# Patient Record
Sex: Female | Born: 1955 | Hispanic: Yes | Smoking: Never smoker
Health system: Southern US, Community
[De-identification: ages and names within clinical notes are randomized; demographics above are authoritative.]

## PROBLEM LIST (undated history)

## (undated) DIAGNOSIS — C801 Malignant (primary) neoplasm, unspecified: Secondary | ICD-10-CM

## (undated) DIAGNOSIS — R51 Headache: Secondary | ICD-10-CM

## (undated) DIAGNOSIS — Z973 Presence of spectacles and contact lenses: Secondary | ICD-10-CM

## (undated) DIAGNOSIS — F32A Depression, unspecified: Secondary | ICD-10-CM

## (undated) DIAGNOSIS — Z950 Presence of cardiac pacemaker: Secondary | ICD-10-CM

## (undated) DIAGNOSIS — F329 Major depressive disorder, single episode, unspecified: Secondary | ICD-10-CM

## (undated) DIAGNOSIS — Z972 Presence of dental prosthetic device (complete) (partial): Secondary | ICD-10-CM

## (undated) DIAGNOSIS — G4700 Insomnia, unspecified: Secondary | ICD-10-CM

## (undated) DIAGNOSIS — D0512 Intraductal carcinoma in situ of left breast: Secondary | ICD-10-CM

## (undated) DIAGNOSIS — R7303 Prediabetes: Secondary | ICD-10-CM

## (undated) DIAGNOSIS — E039 Hypothyroidism, unspecified: Secondary | ICD-10-CM

## (undated) DIAGNOSIS — M7918 Myalgia, other site: Secondary | ICD-10-CM

## (undated) DIAGNOSIS — M1990 Unspecified osteoarthritis, unspecified site: Secondary | ICD-10-CM

## (undated) DIAGNOSIS — R519 Headache, unspecified: Secondary | ICD-10-CM

## (undated) DIAGNOSIS — E785 Hyperlipidemia, unspecified: Secondary | ICD-10-CM

## (undated) HISTORY — PX: BREAST SURGERY: SHX581

## (undated) HISTORY — PX: MASTECTOMY, PARTIAL: SHX709

## (undated) HISTORY — PX: RADIOACTIVE SEED GUIDED PARTIAL MASTECTOMY/AXILLARY SENTINEL NODE BIOPSY/AXILLARY NODE DISSECTION: SHX6491

## (undated) HISTORY — PX: COLONOSCOPY: SHX174

---

## 2015-01-22 ENCOUNTER — Other Ambulatory Visit: Payer: Self-pay | Admitting: Obstetrics and Gynecology

## 2015-02-15 ENCOUNTER — Inpatient Hospital Stay (HOSPITAL_COMMUNITY): Admission: RE | Admit: 2015-02-15 | Payer: Self-pay | Source: Ambulatory Visit

## 2015-02-16 ENCOUNTER — Other Ambulatory Visit: Payer: Self-pay | Admitting: Obstetrics and Gynecology

## 2015-02-16 ENCOUNTER — Other Ambulatory Visit (HOSPITAL_COMMUNITY): Payer: Self-pay | Admitting: Obstetrics and Gynecology

## 2015-02-16 NOTE — H&P (Signed)
Tiffany Berg is a 59 y.o.  female P 3-0-0-3 presents for hysterectomy,  anterior-posterior colporrhaphy and placement of tension free vaginal tape because of symptomatic pelvic prolapse.  [employer] utilized Allena Katz # [phone]) to obtain the patient's history of present illness and medical history.  For the past 6 months,  the patient reports daily pelvic pain, hip pain and lower back pain that can increase to 8/10 on a 10 point pain scale.  Symptoms are worse with prolong standing but relieved with sitting and elevation of her feet. She has not taken any analgesia for the pain.  She goes on to report increase pelvic discomfort with voiding,  the need to manually support her uterus in order to void completely and a sensation as if her "bottom" is falling out.  She denies vaginal bleeding, is not sexually active and only on occasion has sluggish bowel movements.  A pelvic ultrasound in March of this year showed:   uterus- 4.95 x 4.41 x 3.48 cm with right ovary-4.02 x 2.61 x 2.87 cm and a simple cyst-3 cm; left ovary-2.72 x 1.43 x 1.08 cm and a 0.08 cm follicle.  Given the findings of complete pelvic procidentia  on exam along with the life altering symptoms described by the patient.  She has consented to proceed with a hysterectomy with bilateral salpingo-oophorectomy, anterior-posterior colporrhaphy,  and placement of tension free vaginal tape for management.   Past Medical History  OB History: G: 3;  P: 3-0-0-3;  SVB 1981,  1985 and 1987  GYN History: menarche: 59 YO    LMP: menopausal     The patient denies history of sexually transmitted disease.  Denies history of abnormal PAP smear.  Last PAP smear: 2015-normal  Medical History: Hypertension, Diabetes Mellitus, Left Breast Cancer, Thyroid Disorder and Hyperlipidemia  Surgical History: 2014  Left Lumpectomy Denies problems with anesthesia or history of blood transfusions  Family History: Heart Disease and Diabetes  Mellitus  Social History: Separated and employed with seasonal work;  Denies tobacco and rare alcohol intake   Medication: Lovastatin 20 mg dailly Levothyroxine 75 mcg daily Tamoxifen 20 mg daily Aspirin 81 mg daily  NKDA  Denies sensitivity to peanuts, shellfish, soy, latex or adhesives.  ROS: Admits to glasses, upper and lower jaw partial dental plates, occasional fleeting lightheadedness, occasional headache, pain in both feet and lower legs at times,  but  denies  vision changes, nasal congestion, dysphagia, tinnitus,  hoarseness, cough, syncope  chest pain, shortness of breath, nausea, vomiting, diarrhea,constipation,  urinary frequency, urgency,  hematuria, vaginitis symptoms,  swelling of joints,easy bruising, pedal edema,  arthralgias, skin rashes, unexplained weight loss and except as is mentioned in the history of present illness, patient's review of systems is otherwise negative.   Physical Exam  Bp: 112/62   P: 72   R: 20  Temperature: 98.2 degrees F orally  Weight: 141 lbs.  Height: 5' 2.5"  BMI: 25.4  Neck: supple without masses or thyromegaly Lungs: clear to auscultation Heart: regular rate and rhythm Abdomen: soft, non-tender and no organomegaly Pelvic:EGBUS- wnl; vagina-atrophic with marked relaxation; uterus-normal size with complete procidentia,  cervix without lesions or motion tenderness; adnexae-no tenderness or masses Extremities:  no clubbing, cyanosis or edema   Assesment:  Pelvic Prolapse   Disposition:  A discussion was held with patient regarding the indication for her procedure(s) along with the risks, which include but are not limited to: reaction to anesthesia, damage to adjacent organs, infection,  excessive bleeding  and erosion of tension free vaginal tape.  The patient verbalized understanding of these risks and has consented to proceed with a Laparoscopically Assisted Vaginal Hysterectomy,  Bilateral Salpingo-ooporectomy, Anterior-Posterior  Colporrhaphy, Cystoscopy and Placement of Tension Free Vaginal Tape at [place] on February 18, 2015  CSN# [id number]   Shady [place] J. Florene Glen, PA-C  for Dr. Harvie Bridge. Mancel Bale  Agree with above

## 2015-02-17 ENCOUNTER — Encounter (HOSPITAL_COMMUNITY)
Admission: RE | Admit: 2015-02-17 | Discharge: 2015-02-17 | Disposition: A | Discharge status: Home / Self Care | Payer: Self-pay | Source: Ambulatory Visit | Attending: Obstetrics and Gynecology | Admitting: Obstetrics and Gynecology

## 2015-02-17 ENCOUNTER — Other Ambulatory Visit: Payer: Self-pay

## 2015-02-17 ENCOUNTER — Encounter (HOSPITAL_COMMUNITY): Payer: Self-pay

## 2015-02-17 DIAGNOSIS — Z01818 Encounter for other preprocedural examination: Secondary | ICD-10-CM | POA: Insufficient documentation

## 2015-02-17 DIAGNOSIS — N819 Female genital prolapse, unspecified: Secondary | ICD-10-CM | POA: Insufficient documentation

## 2015-02-17 HISTORY — DX: Malignant (primary) neoplasm, unspecified: C80.1

## 2015-02-17 HISTORY — DX: Major depressive disorder, single episode, unspecified: F32.9

## 2015-02-17 HISTORY — DX: Hypothyroidism, unspecified: E03.9

## 2015-02-17 HISTORY — DX: Headache: R51

## 2015-02-17 HISTORY — DX: Depression, unspecified: F32.A

## 2015-02-17 HISTORY — DX: Headache, unspecified: R51.9

## 2015-02-17 LAB — CBC
HCT: 38.8 % (ref 36.0–46.0)
Hemoglobin: 13.6 g/dL (ref 12.0–15.0)
MCH: 31.4 pg (ref 26.0–34.0)
MCHC: 35.1 g/dL (ref 30.0–36.0)
MCV: 89.6 fL (ref 78.0–100.0)
Platelets: 208 10*3/uL (ref 150–400)
RBC: 4.33 MIL/uL (ref 3.87–5.11)
RDW: 12.8 % (ref 11.5–15.5)
WBC: 4.9 10*3/uL (ref 4.0–10.5)

## 2015-02-17 LAB — BASIC METABOLIC PANEL
Anion gap: 5 (ref 5–15)
BUN: 16 mg/dL (ref 6–20)
CO2: 29 mmol/L (ref 22–32)
CREATININE: 0.68 mg/dL (ref 0.44–1.00)
Calcium: 8.7 mg/dL — ABNORMAL LOW (ref 8.9–10.3)
Chloride: 106 mmol/L (ref 101–111)
GFR calc Af Amer: >60 mL/min (ref >60)
Glucose, Bld: 103 mg/dL — ABNORMAL HIGH (ref 65–99)
Potassium: 4.2 mmol/L (ref 3.5–5.1)
Sodium: 140 mmol/L (ref 135–145)

## 2015-02-17 NOTE — Patient Instructions (Addendum)
Instrucciones:  Su cirugia esta programada para-( your procedure is scheduled on) :_____6/16/16_____________  Lyndal Pulley por la entrada principal a la(s) -(enter through the main entrance at):__9am___________  [place],  [place] e informenos de su llegada ( pick up phone, dial [phone] on arrival)  Por favor llame al [phone] si tiene algun problema la [place] ( please call [phone] if you have any problems the morning of surgery.)  Recuerde: (Remember)  No coma alimentos ni tome liquidos, incluyendo agua, despues de la medianoche del  ( Do not eat food or drink liquids including water after midnight tonight______________  [employer] medicinas la manana de la cirugia con un sorbito de agua (take these meds the morning of surgery with a SIP of water)_____Thyroid pill_____________________________  Lavena Stanford los dientes en la manana de la [place]. (you may brush your teeth the morning of surgery)  NO use joyas, maquillaje de ojos, lapiz labial, crema para el cuerpo o esmalte de unas oscuro - las unas de los pies pueden estar pintados. ( Do not wear jewelry, eye makeup, lipstick, body lotion, or dark fingernail polish)  Puede usar desodorante ( you may wear deodorant)  Si va a ser ingresado despues de las [place], deje la [place] en el carro hasta que se le haya asignado una habitacion. ( If you are to be admitted after surgery, leave suitcase in car until your room has been assigned.)  A los pacientes que se les de de alta el mismo dia no se les permitira manejar a casa.  ( Patients discharged on the day of surgery will not be allowed to drive home)  Use ropa suelta y comoda de regreso a Manufacturing engineer. ( wear loose comfortable clothes for ride home)  [place] (patient signature) ______________________________________

## 2015-02-18 ENCOUNTER — Encounter (HOSPITAL_COMMUNITY)
Admission: RE | Disposition: A | Discharge status: Home / Self Care | Payer: Self-pay | Source: Ambulatory Visit | Attending: Obstetrics and Gynecology

## 2015-02-18 ENCOUNTER — Observation Stay (HOSPITAL_COMMUNITY)
Admission: RE | Admit: 2015-02-18 | Discharge: 2015-02-20 | Disposition: A | Discharge status: Home / Self Care | Payer: Self-pay | Source: Ambulatory Visit | Attending: Obstetrics and Gynecology | Admitting: Obstetrics and Gynecology

## 2015-02-18 ENCOUNTER — Ambulatory Visit (HOSPITAL_COMMUNITY): Payer: Self-pay | Admitting: Anesthesiology

## 2015-02-18 DIAGNOSIS — N819 Female genital prolapse, unspecified: Secondary | ICD-10-CM | POA: Diagnosis present

## 2015-02-18 DIAGNOSIS — Z7982 Long term (current) use of aspirin: Secondary | ICD-10-CM | POA: Insufficient documentation

## 2015-02-18 DIAGNOSIS — E119 Type 2 diabetes mellitus without complications: Secondary | ICD-10-CM | POA: Insufficient documentation

## 2015-02-18 DIAGNOSIS — E079 Disorder of thyroid, unspecified: Secondary | ICD-10-CM | POA: Insufficient documentation

## 2015-02-18 DIAGNOSIS — Z79899 Other long term (current) drug therapy: Secondary | ICD-10-CM | POA: Insufficient documentation

## 2015-02-18 DIAGNOSIS — N736 Female pelvic peritoneal adhesions (postinfective): Secondary | ICD-10-CM | POA: Insufficient documentation

## 2015-02-18 DIAGNOSIS — N800 Endometriosis of uterus: Secondary | ICD-10-CM | POA: Insufficient documentation

## 2015-02-18 DIAGNOSIS — E785 Hyperlipidemia, unspecified: Secondary | ICD-10-CM | POA: Insufficient documentation

## 2015-02-18 DIAGNOSIS — N840 Polyp of corpus uteri: Secondary | ICD-10-CM | POA: Insufficient documentation

## 2015-02-18 DIAGNOSIS — Z853 Personal history of malignant neoplasm of breast: Secondary | ICD-10-CM | POA: Insufficient documentation

## 2015-02-18 DIAGNOSIS — N814 Uterovaginal prolapse, unspecified: Principal | ICD-10-CM | POA: Insufficient documentation

## 2015-02-18 DIAGNOSIS — I10 Essential (primary) hypertension: Secondary | ICD-10-CM | POA: Insufficient documentation

## 2015-02-18 HISTORY — PX: BLADDER SUSPENSION: SHX72

## 2015-02-18 HISTORY — PX: ANTERIOR AND POSTERIOR REPAIR: SHX5121

## 2015-02-18 HISTORY — PX: SALPINGOOPHORECTOMY: SHX82

## 2015-02-18 HISTORY — PX: CYSTOSCOPY: SHX5120

## 2015-02-18 HISTORY — PX: LAPAROSCOPIC ASSISTED VAGINAL HYSTERECTOMY: SHX5398

## 2015-02-18 LAB — CBC
HCT: 34.6 % — ABNORMAL LOW (ref 36.0–46.0)
HEMOGLOBIN: 12.2 g/dL (ref 12.0–15.0)
MCH: 31.5 pg (ref 26.0–34.0)
MCHC: 35.3 g/dL (ref 30.0–36.0)
MCV: 89.4 fL (ref 78.0–100.0)
Platelets: 195 10*3/uL (ref 150–400)
RBC: 3.87 MIL/uL (ref 3.87–5.11)
RDW: 12.5 % (ref 11.5–15.5)
WBC: 12.7 10*3/uL — ABNORMAL HIGH (ref 4.0–10.5)

## 2015-02-18 SURGERY — HYSTERECTOMY, VAGINAL, LAPAROSCOPY-ASSISTED
Anesthesia: General | Site: Vagina

## 2015-02-18 MED ORDER — PHENYLEPHRINE 40 MCG/ML (10ML) SYRINGE FOR IV PUSH (FOR BLOOD PRESSURE SUPPORT)
PREFILLED_SYRINGE | INTRAVENOUS | Status: AC
  Filled 2015-02-18: qty 10

## 2015-02-18 MED ORDER — BUPIVACAINE HCL (PF) 0.25 % IJ SOLN
INTRAMUSCULAR | Status: DC | PRN
  Administered 2015-02-18: 17 mL

## 2015-02-18 MED ORDER — PHENYLEPHRINE HCL 10 MG/ML IJ SOLN
INTRAMUSCULAR | Status: DC | PRN
  Administered 2015-02-18: 80 ug via INTRAVENOUS

## 2015-02-18 MED ORDER — KETOROLAC TROMETHAMINE 30 MG/ML IJ SOLN
15.0000 mg | Freq: Once | INTRAMUSCULAR | Status: AC
  Administered 2015-02-18: 15 mg via INTRAVENOUS

## 2015-02-18 MED ORDER — ROCURONIUM BROMIDE 100 MG/10ML IV SOLN
INTRAVENOUS | Status: DC | PRN
  Administered 2015-02-18 (×3): 10 mg via INTRAVENOUS
  Administered 2015-02-18: 20 mg via INTRAVENOUS
  Administered 2015-02-18 (×2): 10 mg via INTRAVENOUS
  Administered 2015-02-18: 30 mg via INTRAVENOUS
  Administered 2015-02-18: 10 mg via INTRAVENOUS
  Administered 2015-02-18: 20 mg via INTRAVENOUS

## 2015-02-18 MED ORDER — ROCURONIUM BROMIDE 100 MG/10ML IV SOLN
INTRAVENOUS | Status: AC
  Filled 2015-02-18: qty 1

## 2015-02-18 MED ORDER — PROMETHAZINE HCL 25 MG/ML IJ SOLN: 6.2500 mg | Freq: Once | INTRAMUSCULAR | Status: DC

## 2015-02-18 MED ORDER — DIPHENHYDRAMINE HCL 50 MG/ML IJ SOLN: 12.5000 mg | Freq: Four times a day (QID) | INTRAMUSCULAR | Status: DC | PRN

## 2015-02-18 MED ORDER — CEFAZOLIN SODIUM-DEXTROSE 2-3 GM-% IV SOLR
INTRAVENOUS | Status: AC
Start: 2015-02-18 — End: 2015-02-18
  Filled 2015-02-18: qty 50

## 2015-02-18 MED ORDER — TAMOXIFEN CITRATE 10 MG PO TABS
20.0000 mg | ORAL_TABLET | Freq: Every day | ORAL | Status: DC
  Administered 2015-02-19 (×2): 20 mg via ORAL
  Filled 2015-02-18 (×3): qty 2

## 2015-02-18 MED ORDER — LACTATED RINGERS IV SOLN
INTRAVENOUS | Status: DC
  Administered 2015-02-18 – 2015-02-19 (×2): via INTRAVENOUS

## 2015-02-18 MED ORDER — GLYCOPYRROLATE 0.2 MG/ML IJ SOLN
INTRAMUSCULAR | Status: DC | PRN
  Administered 2015-02-18: 0.2 mg via INTRAVENOUS

## 2015-02-18 MED ORDER — METHYLENE BLUE 1 % INJ SOLN
INTRAMUSCULAR | Status: AC
  Filled 2015-02-18: qty 10

## 2015-02-18 MED ORDER — VASOPRESSIN 20 UNIT/ML IV SOLN
INTRAVENOUS | Status: DC | PRN
  Administered 2015-02-18: 48 mL via INTRAMUSCULAR

## 2015-02-18 MED ORDER — CEFAZOLIN SODIUM-DEXTROSE 2-3 GM-% IV SOLR
INTRAVENOUS | Status: AC
  Filled 2015-02-18: qty 50

## 2015-02-18 MED ORDER — HYDROMORPHONE HCL 1 MG/ML IJ SOLN
INTRAMUSCULAR | Status: AC
  Filled 2015-02-18: qty 1

## 2015-02-18 MED ORDER — PRAVASTATIN SODIUM 20 MG PO TABS
20.0000 mg | ORAL_TABLET | Freq: Every day | ORAL | Status: DC
  Administered 2015-02-19: 20 mg via ORAL
  Filled 2015-02-18 (×2): qty 1

## 2015-02-18 MED ORDER — HYDROMORPHONE HCL 1 MG/ML IJ SOLN
INTRAMUSCULAR | Status: DC | PRN
  Administered 2015-02-18 (×2): 0.5 mg via INTRAVENOUS

## 2015-02-18 MED ORDER — ACETAMINOPHEN 160 MG/5ML PO SOLN
ORAL | Status: AC
  Administered 2015-02-18: 650 mg via ORAL
  Filled 2015-02-18: qty 40.6

## 2015-02-18 MED ORDER — IBUPROFEN 600 MG PO TABS
600.0000 mg | ORAL_TABLET | Freq: Four times a day (QID) | ORAL | Status: DC | PRN
  Administered 2015-02-19 – 2015-02-20 (×3): 600 mg via ORAL
  Filled 2015-02-18 (×3): qty 1

## 2015-02-18 MED ORDER — MIDAZOLAM HCL 5 MG/5ML IJ SOLN
INTRAMUSCULAR | Status: DC | PRN
  Administered 2015-02-18: 2 mg via INTRAVENOUS

## 2015-02-18 MED ORDER — PROMETHAZINE HCL 25 MG/ML IJ SOLN
INTRAMUSCULAR | Status: AC
  Administered 2015-02-18: 6.25 mg
  Filled 2015-02-18: qty 1

## 2015-02-18 MED ORDER — KETOROLAC TROMETHAMINE 30 MG/ML IJ SOLN
15.0000 mg | Freq: Four times a day (QID) | INTRAMUSCULAR | Status: AC
  Administered 2015-02-19 (×2): 15 mg via INTRAVENOUS
  Filled 2015-02-18 (×2): qty 1

## 2015-02-18 MED ORDER — SODIUM CHLORIDE 0.9 % IJ SOLN
INTRAMUSCULAR | Status: AC
  Filled 2015-02-18: qty 100

## 2015-02-18 MED ORDER — OXYCODONE-ACETAMINOPHEN 5-325 MG PO TABS
1.0000 | ORAL_TABLET | ORAL | Status: DC | PRN
  Administered 2015-02-19: 2 via ORAL
  Administered 2015-02-19 – 2015-02-20 (×3): 1 via ORAL
  Filled 2015-02-18: qty 1
  Filled 2015-02-18: qty 2
  Filled 2015-02-18 (×2): qty 1

## 2015-02-18 MED ORDER — NALOXONE HCL 0.4 MG/ML IJ SOLN: 0.4000 mg | INTRAMUSCULAR | Status: DC | PRN

## 2015-02-18 MED ORDER — ONDANSETRON HCL 4 MG/2ML IJ SOLN
INTRAMUSCULAR | Status: AC
  Filled 2015-02-18: qty 2

## 2015-02-18 MED ORDER — FENTANYL CITRATE (PF) 100 MCG/2ML IJ SOLN
INTRAMUSCULAR | Status: DC | PRN
  Administered 2015-02-18 (×3): 50 ug via INTRAVENOUS
  Administered 2015-02-18: 100 ug via INTRAVENOUS
  Administered 2015-02-18 (×3): 50 ug via INTRAVENOUS
  Administered 2015-02-18: 100 ug via INTRAVENOUS

## 2015-02-18 MED ORDER — HYDROMORPHONE 0.3 MG/ML IV SOLN
INTRAVENOUS | Status: DC
  Administered 2015-02-18: 21:00:00 via INTRAVENOUS
  Administered 2015-02-19: 0.599 mg via INTRAVENOUS
  Administered 2015-02-19: 0.2 mg via INTRAVENOUS
  Filled 2015-02-18: qty 25

## 2015-02-18 MED ORDER — NEOSTIGMINE METHYLSULFATE 10 MG/10ML IV SOLN
INTRAVENOUS | Status: AC
  Filled 2015-02-18: qty 1

## 2015-02-18 MED ORDER — BUPIVACAINE HCL (PF) 0.25 % IJ SOLN
INTRAMUSCULAR | Status: AC
Start: 2015-02-18 — End: 2015-02-18
  Filled 2015-02-18: qty 30

## 2015-02-18 MED ORDER — LIDOCAINE HCL (CARDIAC) 20 MG/ML IV SOLN
INTRAVENOUS | Status: DC | PRN
  Administered 2015-02-18: 40 mg via INTRAVENOUS

## 2015-02-18 MED ORDER — SODIUM CHLORIDE 0.9 % IJ SOLN: 9.0000 mL | INTRAMUSCULAR | Status: DC | PRN

## 2015-02-18 MED ORDER — LACTATED RINGERS IV SOLN
INTRAVENOUS | Status: DC
Start: 2015-02-18 — End: 2015-02-18
  Administered 2015-02-18 (×3): via INTRAVENOUS

## 2015-02-18 MED ORDER — GLYCOPYRROLATE 0.2 MG/ML IJ SOLN
INTRAMUSCULAR | Status: AC
  Filled 2015-02-18: qty 4

## 2015-02-18 MED ORDER — LACTATED RINGERS IR SOLN
Status: DC | PRN
  Administered 2015-02-18: 3000 mL

## 2015-02-18 MED ORDER — SCOPOLAMINE 1 MG/3DAYS TD PT72
MEDICATED_PATCH | TRANSDERMAL | Status: AC
  Administered 2015-02-18: 1.5 mg
  Filled 2015-02-18: qty 1

## 2015-02-18 MED ORDER — PROPOFOL 10 MG/ML IV BOLUS
INTRAVENOUS | Status: DC | PRN
  Administered 2015-02-18: 130 mg via INTRAVENOUS

## 2015-02-18 MED ORDER — LEVOTHYROXINE SODIUM 75 MCG PO TABS
75.0000 ug | ORAL_TABLET | Freq: Every day | ORAL | Status: DC
  Administered 2015-02-19 – 2015-02-20 (×2): 75 ug via ORAL
  Filled 2015-02-18 (×3): qty 1

## 2015-02-18 MED ORDER — HYDROMORPHONE HCL 1 MG/ML IJ SOLN
0.2500 mg | INTRAMUSCULAR | Status: DC | PRN
  Administered 2015-02-18: 0.25 mg via INTRAVENOUS

## 2015-02-18 MED ORDER — HEPARIN SODIUM (PORCINE) 5000 UNIT/ML IJ SOLN
INTRAMUSCULAR | Status: AC
  Filled 2015-02-18: qty 1

## 2015-02-18 MED ORDER — FENTANYL CITRATE (PF) 250 MCG/5ML IJ SOLN
INTRAMUSCULAR | Status: AC
  Filled 2015-02-18: qty 5

## 2015-02-18 MED ORDER — STERILE WATER FOR IRRIGATION IR SOLN
Status: DC | PRN
Start: 2015-02-18 — End: 2015-02-18
  Administered 2015-02-18: 1000 mL via INTRAVESICAL

## 2015-02-18 MED ORDER — 0.9 % SODIUM CHLORIDE (POUR BTL) OPTIME
TOPICAL | Status: DC | PRN
  Administered 2015-02-18: 2000 mL

## 2015-02-18 MED ORDER — ESTRADIOL 0.1 MG/GM VA CREA
TOPICAL_CREAM | VAGINAL | Status: AC
  Filled 2015-02-18: qty 85

## 2015-02-18 MED ORDER — ONDANSETRON HCL 4 MG PO TABS: 4.0000 mg | ORAL_TABLET | Freq: Three times a day (TID) | ORAL | Status: DC | PRN

## 2015-02-18 MED ORDER — ONDANSETRON HCL 4 MG/2ML IJ SOLN: 4.0000 mg | Freq: Four times a day (QID) | INTRAMUSCULAR | Status: DC | PRN

## 2015-02-18 MED ORDER — CEFAZOLIN SODIUM-DEXTROSE 2-3 GM-% IV SOLR
2.0000 g | INTRAVENOUS | Status: AC
  Administered 2015-02-18 (×2): 2 g via INTRAVENOUS

## 2015-02-18 MED ORDER — METHYLENE BLUE 1 % INJ SOLN
INTRAMUSCULAR | Status: DC | PRN
  Administered 2015-02-18: 5 mg via INTRAVENOUS

## 2015-02-18 MED ORDER — PROPOFOL 10 MG/ML IV BOLUS
INTRAVENOUS | Status: AC
  Filled 2015-02-18: qty 20

## 2015-02-18 MED ORDER — LIDOCAINE HCL (CARDIAC) 20 MG/ML IV SOLN
INTRAVENOUS | Status: AC
Start: 2015-02-18 — End: 2015-02-18
  Filled 2015-02-18: qty 5

## 2015-02-18 MED ORDER — ESTRADIOL 0.1 MG/GM VA CREA
TOPICAL_CREAM | VAGINAL | Status: DC | PRN
  Administered 2015-02-18: 1 via VAGINAL

## 2015-02-18 MED ORDER — MENTHOL 3 MG MT LOZG: 1.0000 | LOZENGE | OROMUCOSAL | Status: DC | PRN

## 2015-02-18 MED ORDER — DIPHENHYDRAMINE HCL 12.5 MG/5ML PO ELIX: 12.5000 mg | ORAL_SOLUTION | Freq: Four times a day (QID) | ORAL | Status: DC | PRN

## 2015-02-18 MED ORDER — KETOROLAC TROMETHAMINE 30 MG/ML IJ SOLN
INTRAMUSCULAR | Status: AC
  Administered 2015-02-18: 15 mg via INTRAVENOUS
  Filled 2015-02-18: qty 1

## 2015-02-18 MED ORDER — MIDAZOLAM HCL 2 MG/2ML IJ SOLN
INTRAMUSCULAR | Status: AC
  Filled 2015-02-18: qty 2

## 2015-02-18 MED ORDER — NEOSTIGMINE METHYLSULFATE 10 MG/10ML IV SOLN
INTRAVENOUS | Status: DC | PRN
  Administered 2015-02-18: 1.5 mg via INTRAVENOUS

## 2015-02-18 MED ORDER — ZOLPIDEM TARTRATE 5 MG PO TABS: 5.0000 mg | ORAL_TABLET | Freq: Every evening | ORAL | Status: DC | PRN

## 2015-02-18 MED ORDER — DEXAMETHASONE SODIUM PHOSPHATE 10 MG/ML IJ SOLN
INTRAMUSCULAR | Status: AC
  Filled 2015-02-18: qty 1

## 2015-02-18 MED ORDER — ONDANSETRON HCL 4 MG/2ML IJ SOLN
INTRAMUSCULAR | Status: DC | PRN
  Administered 2015-02-18: 4 mg via INTRAVENOUS

## 2015-02-18 MED ORDER — DEXAMETHASONE SODIUM PHOSPHATE 10 MG/ML IJ SOLN
INTRAMUSCULAR | Status: DC | PRN
  Administered 2015-02-18: 10 mg via INTRAVENOUS

## 2015-02-18 MED ORDER — ACETAMINOPHEN 160 MG/5ML PO SOLN
650.0000 mg | Freq: Once | ORAL | Status: AC
  Administered 2015-02-18: 650 mg via ORAL

## 2015-02-18 MED ORDER — VASOPRESSIN 20 UNIT/ML IV SOLN
INTRAVENOUS | Status: AC
  Filled 2015-02-18: qty 1

## 2015-02-18 SURGICAL SUPPLY — 69 items
BLADE SURG 11 STRL SS (BLADE) ×5 IMPLANT
BLADE SURG 15 STRL LF C SS BP (BLADE) ×4 IMPLANT
BLADE SURG 15 STRL SS (BLADE) ×1
CABLE HIGH FREQUENCY MONO STRZ (ELECTRODE) ×5 IMPLANT
CANISTER SUCT 3000ML (MISCELLANEOUS) ×5 IMPLANT
CATH FOLEY 2WAY SLVR  5CC 18FR (CATHETERS) ×2
CATH FOLEY 2WAY SLVR 5CC 18FR (CATHETERS) ×8 IMPLANT
CATH ROBINSON RED A/P 16FR (CATHETERS) ×5 IMPLANT
CLOTH BEACON ORANGE TIMEOUT ST (SAFETY) ×5 IMPLANT
CONT PATH 16OZ SNAP LID 3702 (MISCELLANEOUS) ×5 IMPLANT
COUNTER NEEDLE 1200 MAGNETIC (NEEDLE) ×5 IMPLANT
COVER BACK TABLE 60X90IN (DRAPES) ×5 IMPLANT
DECANTER SPIKE VIAL GLASS SM (MISCELLANEOUS) ×5 IMPLANT
DRAPE SHEET LG 3/4 BI-LAMINATE (DRAPES) ×10 IMPLANT
DRAPE STERI URO 9X17 APER PCH (DRAPES) ×5 IMPLANT
DRSG COVADERM PLUS 2X2 (GAUZE/BANDAGES/DRESSINGS) ×10 IMPLANT
DRSG OPSITE POSTOP 3X4 (GAUZE/BANDAGES/DRESSINGS) ×10 IMPLANT
DURAPREP 26ML APPLICATOR (WOUND CARE) ×5 IMPLANT
ELECT REM PT RETURN 9FT ADLT (ELECTROSURGICAL) ×5
ELECTRODE REM PT RTRN 9FT ADLT (ELECTROSURGICAL) ×4 IMPLANT
EVACUATOR SMOKE 8.L (FILTER) ×5 IMPLANT
FORCEPS CUTTING 33CM 5MM (CUTTING FORCEPS) ×10 IMPLANT
GAUZE PACKING 1 X5 YD ST (GAUZE/BANDAGES/DRESSINGS) ×5 IMPLANT
GAUZE PACKING 2X5 YD STRL (GAUZE/BANDAGES/DRESSINGS) ×5 IMPLANT
GAUZE SPONGE 4X4 16PLY XRAY LF (GAUZE/BANDAGES/DRESSINGS) ×10 IMPLANT
GLOVE BIO SURGEON STRL SZ7.5 (GLOVE) ×20 IMPLANT
GLOVE BIOGEL PI IND STRL 6.5 (GLOVE) ×4 IMPLANT
GLOVE BIOGEL PI IND STRL 7.0 (GLOVE) ×8 IMPLANT
GLOVE BIOGEL PI IND STRL 7.5 (GLOVE) ×8 IMPLANT
GLOVE BIOGEL PI INDICATOR 6.5 (GLOVE) ×1
GLOVE BIOGEL PI INDICATOR 7.0 (GLOVE) ×2
GLOVE BIOGEL PI INDICATOR 7.5 (GLOVE) ×2
GLOVE ECLIPSE 6.0 STRL STRAW (GLOVE) ×5 IMPLANT
GLOVE SURG SS PI 7.0 STRL IVOR (GLOVE) ×20 IMPLANT
GOWN STRL REUS W/TWL LRG LVL3 (GOWN DISPOSABLE) ×20 IMPLANT
LIQUID BAND (GAUZE/BANDAGES/DRESSINGS) ×5 IMPLANT
NEEDLE HYPO 22GX1.5 SAFETY (NEEDLE) ×5 IMPLANT
NEEDLE INSUFFLATION 120MM (ENDOMECHANICALS) ×5 IMPLANT
NEEDLE MAYO .5 CIRCLE (NEEDLE) ×5 IMPLANT
NS IRRIG 1000ML POUR BTL (IV SOLUTION) ×5 IMPLANT
PACK LAVH (CUSTOM PROCEDURE TRAY) ×5 IMPLANT
PACK ROBOTIC GOWN (GOWN DISPOSABLE) ×5 IMPLANT
PACK VAGINAL WOMENS (CUSTOM PROCEDURE TRAY) ×5 IMPLANT
PAD POSITIONER PINK NONSTERILE (MISCELLANEOUS) ×5 IMPLANT
SCISSORS LAP 5X35 DISP (ENDOMECHANICALS) ×5 IMPLANT
SET CYSTO W/LG BORE CLAMP LF (SET/KITS/TRAYS/PACK) ×5 IMPLANT
SET IRRIG TUBING LAPAROSCOPIC (IRRIGATION / IRRIGATOR) ×5 IMPLANT
SLEEVE XCEL OPT CAN 5 100 (ENDOMECHANICALS) ×5 IMPLANT
SLING TRANS VAGINAL TAPE (Sling) ×1 IMPLANT
SLING UTERINE/ABD GYNECARE TVT (Sling) ×4 IMPLANT
SPONGE SURGIFOAM ABS GEL 12-7 (HEMOSTASIS) ×15 IMPLANT
STRIP CLOSURE SKIN 1/4X3 (GAUZE/BANDAGES/DRESSINGS) IMPLANT
SUT MON AB 4-0 PS1 27 (SUTURE) ×5 IMPLANT
SUT VIC AB 0 CT1 18XCR BRD8 (SUTURE) ×12 IMPLANT
SUT VIC AB 0 CT1 27 (SUTURE) ×1
SUT VIC AB 0 CT1 27XBRD ANBCTR (SUTURE) ×4 IMPLANT
SUT VIC AB 0 CT1 36 (SUTURE) ×5 IMPLANT
SUT VIC AB 0 CT1 8-18 (SUTURE) ×3
SUT VIC AB 2-0 CT1 27 (SUTURE) ×6
SUT VIC AB 2-0 CT1 TAPERPNT 27 (SUTURE) ×24 IMPLANT
SUT VIC AB 2-0 SH 27 (SUTURE) ×11
SUT VIC AB 2-0 SH 27XBRD (SUTURE) ×44 IMPLANT
SUT VICRYL 0 TIES 12 18 (SUTURE) ×5 IMPLANT
SYR TB 1ML LUER SLIP (SYRINGE) ×5 IMPLANT
TOWEL OR 17X24 6PK STRL BLUE (TOWEL DISPOSABLE) ×10 IMPLANT
TRAY FOLEY CATH SILVER 14FR (SET/KITS/TRAYS/PACK) ×5 IMPLANT
TROCAR XCEL NON-BLD 11X100MML (ENDOMECHANICALS) ×5 IMPLANT
WARMER LAPAROSCOPE (MISCELLANEOUS) ×5 IMPLANT
WATER STERILE IRR 1000ML POUR (IV SOLUTION) ×5 IMPLANT

## 2015-02-18 NOTE — Transfer of Care (Signed)
Immediate Anesthesia Transfer of Care Note  Patient: Tiffany Berg  Procedure(s) Performed: Procedure(s): LAPAROSCOPIC ASSISTED VAGINAL HYSTERECTOMY (N/A) BILATERAL SALPINGO OOPHORECTOMY (Bilateral) TRANSVAGINAL TAPE (TVT) PROCEDURE (N/A) CYSTOSCOPY (Bilateral) ANTERIOR (CYSTOCELE) AND POSTERIOR REPAIR (RECTOCELE) (N/A)  Patient Location: PACU  Anesthesia Type:General  Level of Consciousness: awake, alert , oriented and patient cooperative  Airway & Oxygen Therapy: Patient Spontanous Breathing and Patient connected to nasal cannula oxygen  Post-op Assessment: Report given to RN and Post -op Vital signs reviewed and stable  Post vital signs: Reviewed and stable  Last Vitals:  Filed Vitals:   02/18/15 0911  BP: 125/64  Pulse: 60  Temp: 36.8 C  Resp: 20    Complications: No apparent anesthesia complications

## 2015-02-18 NOTE — Anesthesia Procedure Notes (Signed)
Procedure Name: Intubation Date/Time: 02/18/2015 10:42 AM Performed by: Ignacia Bayley Pre-anesthesia Checklist: Patient identified, Emergency Drugs available, Suction available and Patient being monitored Patient Re-evaluated:Patient Re-evaluated prior to inductionOxygen Delivery Method: Circle system utilized Preoxygenation: Pre-oxygenation with 100% oxygen Intubation Type: IV induction Ventilation: Mask ventilation without difficulty Laryngoscope Size: Miller and 2 Grade View: Grade I Tube type: Oral Tube size: 7.0 mm Number of attempts: 1 Airway Equipment and Method: Stylet Placement Confirmation: ETT inserted through vocal cords under direct vision,  positive ETCO2 and breath sounds checked- equal and bilateral Secured at: 20 cm Tube secured with: Tape Dental Injury: Teeth and Oropharynx as per pre-operative assessment

## 2015-02-18 NOTE — Anesthesia Preprocedure Evaluation (Signed)
Anesthesia Evaluation  Patient identified by MRN, date of birth, ID band Patient awake    Reviewed: Allergy & Precautions, H&P , Patient's Chart, lab work & pertinent test results, reviewed documented beta blocker date and time   Airway Mallampati: II  TM Distance: >3 FB Neck ROM: full    Dental no notable dental hx.    Pulmonary  breath sounds clear to auscultation  Pulmonary exam normal       Cardiovascular Rhythm:regular Rate:Normal     Neuro/Psych PSYCHIATRIC DISORDERS    GI/Hepatic   Endo/Other  Hypothyroidism   Renal/GU      Musculoskeletal   Abdominal   Peds  Hematology   Anesthesia Other Findings   Reproductive/Obstetrics                             Anesthesia Physical Anesthesia Plan  ASA: II  Anesthesia Plan: General   Post-op Pain Management:    Induction: Intravenous  Airway Management Planned: Oral ETT  Additional Equipment:   Intra-op Plan:   Post-operative Plan: Extubation in OR  Informed Consent: I have reviewed the patients History and Physical, chart, labs and discussed the procedure including the risks, benefits and alternatives for the proposed anesthesia with the patient or authorized representative who has indicated his/her understanding and acceptance.   Dental Advisory Given and Dental advisory given  Plan Discussed with: CRNA and Surgeon  Anesthesia Plan Comments: (  Discussed general anesthesia, including possible nausea, instrumentation of airway, sore throat,pulmonary aspiration, etc. I asked if the were any outstanding questions, or  concerns before we proceeded. )        Anesthesia Quick Evaluation

## 2015-02-18 NOTE — Op Note (Signed)
Preop Diagnosis: Symptomatic Pelvic Relaxation (uterine prolapse, cystocele and rectocele)  Postop Diagnosis: 1.Symptomatic Pelvic Relaxation (uterine prolapse, cystocele and rectocele) 2.Adnexal Adhesions  Procedure: 1.LAVH 2.BS 3. LOA 4.Anterior and Posterior Colporrhaphy 5.Cystoscopy  Anesthesia: General   Attending: Everett Graff, MD  Assistant: Earnstine Regal, PA  Findings: Adhesions to bilateral adnexa L>R, Left ovary adherent to left pelvic side wall  Pathology: uterus, cervix, bilateral fallopian tubes and ovaries  Fluids: 2600 cc  UOP: 1500 cc  EBL: 989 cc  Complications: None  Procedure: The patient was taken to the operating room, placed under general anesthesia and prepped and draped in the normal sterile fashion. A time out was performed.  A Foley catheter was placed in the bladder.  The uterus was prolapsed beyond the introitus and hulka manipulator was placed in the uterus.  Attention was then turned to the abdomen. A 10 mm infraumbilical incision was made with the scalpel after 8 of 0.25% percent Marcaine was used for local anesthesia.  The Veress needle was placed in the intraabdominal cavity.  The 48mm trochar was introduced into the intraabdominal cavity as well.  Two 5 mm trochars were placed in the right and left lower quadrants under direct visualization with the laparoscope and incisions were made and 0.25% Marcaine injected.  A 68mm suprapubic incision was made as well and 33mm trochar introduced under direct visualization.  Using the laparoscope, scissors and gyrus tripolar, the adhesions were cauterized and cut bilaterally.  The left infundibulopelvic ligament was cauterized and cut and ovary and tube excised as well while hugging close to the ovary until it was completely freed and uterine ovarian ligament cauterized and cut as well.  There may have been even a small amount of ovarian tissue that remained on that left pelvic side wall.  The left ovary and tube were  removed through the 10 mm suprapubic port.  The left round ligament was cauterized and cut with the gyrus tripolar cautery as well and the bladder flap created with the monopolar scissors and removed away from the uterus after doing the same to the contralateral side.  The abdomen was then draped and attention was then turned to the vagina.  A weighted speculum was placed in the posterior fourchette.  20u of pitressin in 100cc of NS was injected circumferentially around the cervix.  The cervix was circumscribed with the bovie and with blunt and sharp dissection the cervix was dissected away from the bladder.  The anterior cul-de-sac was entered however the posterior cul-de-sac was entered only after clamping bilaterally along the cervix with the heanie clamp, excising and ligating the pedicles until finally the posterior cul-de-sac was able to be entered.  There had been multiple adhesions posteriorly in the cul-de-sac and these were bluntly dissected away once the posterior cul-de-sac was entered.  Both uterosacral and cardinal ligaments were clamped, cut and suture ligated.  The uterine arteries were clamped, cut and suture ligated bilaterally.  The remained of the cervix and uterus (parametrial tissue) was excised after clamping the adjacent tissue, cutting and suture ligating up to the fundus and the uterus removed.  The mcall suture was placed.   The vaginal cuff was closed with interrupted sutures of 0 vicryl.   The patient was given methylene blue and cystoscopy was performed.  Both ureters were seen to efflux without difficulty but the urine was too dilute to ever see the methylene blue.  The bladder had full integrity with no suture or laceration visualized.  The vagina was  inspected and the cuff was noted to be intact.    A weighted speculum was placed in the patient's vagina and the anterior vaginal wall was retracted using Deaver retractors. The anterior vaginal wall was injected with dilute pitressin  and the anterior vaginal wall was then incised and dissected away from the underlying layer of tissue. The cystocele was repaired with plication stitches and a purse string stitch.  The excess vaginal wall tissue was excised and repaired with interrupted stitches of 2-0 Vicryl. Attention was then turned to the posterior vaginal wall where dilute Pitressin was injected. The posterior vaginal wall was incised and the underlying tissue was dissected away from the posterior vaginal wall. The rectocele was repaired using plication stitches of 2-0 Vicryl. Excess posterior vaginal wall tissue was excised and the posterior vaginal wall repaired with 2-0 vicryl via a running interlocking stitch. The perineum was repaired with 2-0 Vicryl via a subcuticular stitch. Cystoscopy was performed and bilateral ureters were noted to efflux without difficulty and there were no inadvertent bladder injuries noted.   The anterior vaginal wall was injected with dilute pitressin at a concentration of 20 units of pitressin in a total of 100cc of normal saline.  An incision was made in the anterior wall of the vagina for approximately 1cm beneath the midurethra and the underlying tissue was dissected away from the anterior vaginal wall down to the level of the lower symphysis pubis bilaterally. Attention was then turned to the mons pubis where two 5 mm incisions were made 2 fingerbreadths from the midline. The transabdominal guide was then passed through the mons pubis incision on the patient's right down through the space of Retzius and out through the anterior vaginal wall after deflecting the rigid urethral catheter guide to the ipsilateral side. The same was done on the contralateral side. Cystoscopy was performed and no invadvertant bladder injury was noted. The bladder was drained with a Foley while deflecting the rigid urethral catheter guide to the patient's right and the mesh was attached to the transabdominal guide and  elevated up through the space of Retzius and out through the incision on the mons pubis on the ipsilateral side. The same was done on the contralateral side. Cystoscopy was performed again and no inadvertant bladder injury was noted. The 62 French Foley was left in the urethra and a large Claiborne Billings was placed between the urethra and the mesh in order to leave the mesh slack beneath the midurethra. The mesh was then cut flush with the skin at the mons pubis incisions bilaterally. Indigo carmine had been administered, cystoscopy was performed again and bilateral ureters were noted to efflux without difficulty.  The anterior vaginal wall incision was repaired with 2-0 vicryl via a running interlocking stitch.  Attention was then turned back to the abdomen after removing top pair of gloves. The abdomen was reinsufflated with CO2 gas.   The abdomen and pelvis was copiously irrigated.  There was some bleeding from the left adnexa which was made hemostatic with cautery.  Hemostasis was noted.  Gel foam was placed along the cuff and the left pedicle.  Good hemostasis was noted.  The suprapubic trochar was removed and fascia repaired with 0 vicryl via a figure of eight stitch.  All trochars were removed under direct visualization using the laparoscope and pneumoperitoneum released.  The umbilical fascia was repaired with 0 vicryl via a figure of eight stitch. The two 10 mm incisions were closed with 3-0 Monocryl via a subcuticular stitch.  All remaining skin incisions were closed with Liquabond and the 10 mm skin incisions were reinforced using Liquabond as well. The vagina was packed with estrogen-soaked packing. The patient tolerated the procedure well and was awaiting return to the recovery room in good condition.  Sponge, instrument, lap and needle counts were correct for each part of the case.  The patient tolerated the procedure well and was returned to the recovery room in stable condition

## 2015-02-18 NOTE — Anesthesia Postprocedure Evaluation (Signed)
   Anesthesia Post-op Note  Patient: Tiffany Berg  Procedure(s) Performed: Procedure(s) (LRB): LAPAROSCOPIC ASSISTED VAGINAL HYSTERECTOMY (N/A) BILATERAL SALPINGO OOPHORECTOMY (Bilateral) TRANSVAGINAL TAPE (TVT) PROCEDURE (N/A) CYSTOSCOPY (Bilateral) ANTERIOR (CYSTOCELE) AND POSTERIOR REPAIR (RECTOCELE) (N/A)  Patient Location: PACU  Anesthesia Type: General  Level of Consciousness: awake and alert   Airway and Oxygen Therapy: Patient Spontanous Breathing  Post-op Pain: mild  Post-op Assessment: Post-op Vital signs reviewed, Patient's Cardiovascular Status Stable, Respiratory Function Stable, Patent Airway and No signs of Nausea or vomiting  Last Vitals:  Filed Vitals:   02/18/15 1930  BP: 117/66  Pulse: 80  Temp:   Resp: 23    Post-op Vital Signs: stable   Complications: No apparent anesthesia complications

## 2015-02-18 NOTE — Interval H&P Note (Signed)
History and Physical Interval Note:  02/18/2015 10:23 AM  Tiffany Berg  has presented today for surgery, with the diagnosis of Genital prolapse - 4 hours  The various methods of treatment have been discussed with the patient and family. After consideration of risks, benefits and other options for treatment, the patient has consented to  Procedure(s): LAPAROSCOPIC ASSISTED VAGINAL HYSTERECTOMY (N/A) BILATERAL SALPINGO OOPHORECTOMY (Bilateral) TENSION FREE TRANSVAGINAL TAPE (TVT) PROCEDURE (N/A) CYSTOSCOPY (Bilateral) ANTERIOR (CYSTOCELE) AND POSTERIOR REPAIR (RECTOCELE) (N/A) as a surgical intervention .  The patient's history has been reviewed, patient examined, no change in status, stable for surgery.  I have reviewed the patient's chart and labs.  Questions were answered to the patient's satisfaction.     Delice Lesch

## 2015-02-18 NOTE — H&P (View-Only) (Signed)
Tiffany Berg is a 59 y.o.  female P 3-0-0-3 presents for hysterectomy,  anterior-posterior colporrhaphy and placement of tension free vaginal tape because of symptomatic pelvic prolapse.  [employer] utilized Allena Katz # [phone]) to obtain the patient's history of present illness and medical history.  For the past 6 months,  the patient reports daily pelvic pain, hip pain and lower back pain that can increase to 8/10 on a 10 point pain scale.  Symptoms are worse with prolong standing but relieved with sitting and elevation of her feet. She has not taken any analgesia for the pain.  She goes on to report increase pelvic discomfort with voiding,  the need to manually support her uterus in order to void completely and a sensation as if her "bottom" is falling out.  She denies vaginal bleeding, is not sexually active and only on occasion has sluggish bowel movements.  A pelvic ultrasound in March of this year showed:   uterus- 4.95 x 4.41 x 3.48 cm with right ovary-4.02 x 2.61 x 2.87 cm and a simple cyst-3 cm; left ovary-2.72 x 1.43 x 1.08 cm and a 3.55 cm follicle.  Given the findings of complete pelvic procidentia  on exam along with the life altering symptoms described by the patient.  She has consented to proceed with a hysterectomy with bilateral salpingo-oophorectomy, anterior-posterior colporrhaphy,  and placement of tension free vaginal tape for management.   Past Medical History  OB History: G: 3;  P: 3-0-0-3;  SVB 1981,  1985 and 1987  GYN History: menarche: 59 YO    LMP: menopausal     The patient denies history of sexually transmitted disease.  Denies history of abnormal PAP smear.  Last PAP smear: 2015-normal  Medical History: Hypertension, Diabetes Mellitus, Left Breast Cancer, Thyroid Disorder and Hyperlipidemia  Surgical History: 2014  Left Lumpectomy Denies problems with anesthesia or history of blood transfusions  Family History: Heart Disease and Diabetes  Mellitus  Social History: Separated and employed with seasonal work;  Denies tobacco and rare alcohol intake   Medication: Lovastatin 20 mg dailly Levothyroxine 75 mcg daily Tamoxifen 20 mg daily Aspirin 81 mg daily  NKDA  Denies sensitivity to peanuts, shellfish, soy, latex or adhesives.  ROS: Admits to glasses, upper and lower jaw partial dental plates, occasional fleeting lightheadedness, occasional headache, pain in both feet and lower legs at times,  but  denies  vision changes, nasal congestion, dysphagia, tinnitus,  hoarseness, cough, syncope  chest pain, shortness of breath, nausea, vomiting, diarrhea,constipation,  urinary frequency, urgency,  hematuria, vaginitis symptoms,  swelling of joints,easy bruising, pedal edema,  arthralgias, skin rashes, unexplained weight loss and except as is mentioned in the history of present illness, patient's review of systems is otherwise negative.   Physical Exam  Bp: 112/62   P: 72   R: 20  Temperature: 98.2 degrees F orally  Weight: 141 lbs.  Height: 5' 2.5"  BMI: 25.4  Neck: supple without masses or thyromegaly Lungs: clear to auscultation Heart: regular rate and rhythm Abdomen: soft, non-tender and no organomegaly Pelvic:EGBUS- wnl; vagina-atrophic with marked relaxation; uterus-normal size with complete procidentia,  cervix without lesions or motion tenderness; adnexae-no tenderness or masses Extremities:  no clubbing, cyanosis or edema   Assesment:  Pelvic Prolapse   Disposition:  A discussion was held with patient regarding the indication for her procedure(s) along with the risks, which include but are not limited to: reaction to anesthesia, damage to adjacent organs, infection,  excessive bleeding  and erosion of tension free vaginal tape.  The patient verbalized understanding of these risks and has consented to proceed with a Laparoscopically Assisted Vaginal Hysterectomy,  Bilateral Salpingo-ooporectomy, Anterior-Posterior  Colporrhaphy, Cystoscopy and Placement of Tension Free Vaginal Tape at [place] on February 18, 2015  CSN# [id number]   [place] J. Florene Glen, PA-C  for Dr. Harvie Bridge. Mancel Bale  Agree with above

## 2015-02-19 ENCOUNTER — Encounter (HOSPITAL_COMMUNITY): Payer: Self-pay | Admitting: *Deleted

## 2015-02-19 LAB — CBC
HEMATOCRIT: 30 % — AB (ref 36.0–46.0)
Hemoglobin: 10.5 g/dL — ABNORMAL LOW (ref 12.0–15.0)
MCH: 31.1 pg (ref 26.0–34.0)
MCHC: 35 g/dL (ref 30.0–36.0)
MCV: 88.8 fL (ref 78.0–100.0)
PLATELETS: 173 10*3/uL (ref 150–400)
RBC: 3.38 MIL/uL — ABNORMAL LOW (ref 3.87–5.11)
RDW: 12.8 % (ref 11.5–15.5)
WBC: 10.3 10*3/uL (ref 4.0–10.5)

## 2015-02-19 MED ORDER — OXYCODONE-ACETAMINOPHEN 5-325 MG PO TABS: 1.0000 | ORAL_TABLET | ORAL | Status: DC | PRN

## 2015-02-19 MED ORDER — IBUPROFEN 600 MG PO TABS: ORAL_TABLET | ORAL | Status: DC

## 2015-02-19 NOTE — Progress Notes (Signed)
Tiffany Berg is a24 y.o.  [id number]  Post Op Date # 1:  LAVH/BSO/Anterior-Posterior Colporrhaphy/TVT  Subjective: Patient is Doing well postoperatively. Patient has Pain is controlled with current analgesics. Medications being used: prescription NSAID's including PCA Dilaudid and narcotic analgesics including Ketorolac.. Tolerating a regular diet, ambulating but hasn't voided since Foley was removed.    Objective: Vital signs in last 24 hours: Temp:  [97.9 F (36.6 C)-98.8 F (37.1 C)] 98.4 F (36.9 C) (06/17 0605) Pulse Rate:  [60-114] 83 (06/17 0605) Resp:  [12-23] 12 (06/17 0605) BP: (100-125)/(52-68) 100/56 mmHg (06/17 0605) SpO2:  [91 %-100 %] 95 % (06/17 0605) Weight:  [141 lb (63.957 kg)] 141 lb (63.957 kg) (06/17 0430)  Intake/Output from previous day: 06/16 0701 - 06/17 0700 In: 4218.8 [P.O.:800; I.V.:3418.8] Out: 2925 [Urine:2725] Intake/Output this shift:    Recent Labs Lab 02/17/15 1050 02/18/15 2246 02/19/15 0541  WBC 4.9 12.7* 10.3  HGB 13.6 12.2 10.5*  HCT 38.8 34.6* 30.0*  PLT 208 195 173     Recent Labs Lab 02/17/15 1050  NA 140  K 4.2  CL 106  CO2 29  BUN 16  CREATININE 0.68  CALCIUM 8.7*  GLUCOSE 103*    EXAM: General: alert, cooperative and no distress Resp: clear to auscultation bilaterally Cardio: regular rate and rhythm, S1, S2 normal, no murmur, click, rub or gallop GI: Bowel sounds present, umbilical dressing intact/clean/dry; Lower abdominal and mons incisions without evidence of infection and with good wound edge approximation. Extremities: Homans sign is negative, no sign of DVT and no calf tenderness;  SCD hose in place and functioning. Vaginal Bleeding: Small, dry blood stain on middle of pad.   Assessment: s/p Procedure(s): LAPAROSCOPIC ASSISTED VAGINAL HYSTERECTOMY BILATERAL SALPINGO OOPHORECTOMY TRANSVAGINAL TAPE (TVT) PROCEDURE CYSTOSCOPY ANTERIOR (CYSTOCELE) AND POSTERIOR REPAIR (RECTOCELE): stable, progressing  well, tolerating diet and anemia  Plan: Routine care, awaiting the patient to void and will consider discharge home later today.  LOS: 1 day    POWELL,ELMIRA, PA-C 02/19/2015 7:26 AM  Pt voiding normal amount but feels like she has to continue to go.  She also feels achy in shoulders and legs.  No calf tenderness.  Will send ucx.  Will observe another night secondary to complaints and low grade temp earlier today.

## 2015-02-19 NOTE — Anesthesia Postprocedure Evaluation (Signed)
  Anesthesia Post-op Note  Patient: Tiffany Berg  Procedure(s) Performed: Procedure(s): LAPAROSCOPIC ASSISTED VAGINAL HYSTERECTOMY (N/A) BILATERAL SALPINGO OOPHORECTOMY (Bilateral) TRANSVAGINAL TAPE (TVT) PROCEDURE (N/A) CYSTOSCOPY (Bilateral) ANTERIOR (CYSTOCELE) AND POSTERIOR REPAIR (RECTOCELE) (N/A)  Patient Location: [facility]  Anesthesia Type:General  Level of Consciousness: awake, alert , oriented and patient cooperative  Airway and Oxygen Therapy: Patient Spontanous Breathing  Post-op Pain: none  Post-op Assessment: Post-op Vital signs reviewed, Patient's Cardiovascular Status Stable, Respiratory Function Stable, Patent Airway and No signs of Nausea or vomiting              Post-op Vital Signs: Reviewed and stable  Last Vitals:  Filed Vitals:   02/19/15 0605  BP: 100/56  Pulse: 83  Temp: 36.9 C  Resp: 12    Complications: No apparent anesthesia complications

## 2015-02-19 NOTE — Discharge Summary (Signed)
Physician Discharge Summary  Patient ID: Tiffany Berg MRN: [id number] DOB/AGE: [birth year] 61 y.o.  Admit date: 02/18/2015 Discharge date: 02/19/2015   Discharge Diagnoses:  Pelvic Prolapse Active Problems:   Pelvic prolapse   Operation: Laparoscopically Assisted Vaginal Hysterectomy, Bilateral Salpingo-oophorectomy, Lysis of Adhesions,  Anterior-Posterior Colporrhaphy, Placement of Tension Free Vaginal Tape and Cystoscopy   Discharged Condition: Good Hospital Course: On the date of admission the patient underwent the aforementioned procedures and tolerated them well.  Post operative course was unremarkable with the patient resuming bowel and bladder function by post operative day #1 though she complained of incomplete bladder emptying. A urine culture was ordered with results pending.  For this reason, along with a low grade temperature elevation the patient was observed an additional day.  Having received the maximum benefit of her hospital stay,  the patient was discharged home on post operative day #2. Marland Kitchen  Discharge  hemoglobin/hematocrit was 10.8./31.2.  Disposition: Home to Self Care  Discharge Medications:    Medication List    TAKE these medications        aspirin EC 81 MG tablet  Take 81 mg by mouth daily.     ibuprofen 600 MG tablet  Commonly known as:  ADVIL,MOTRIN  1 PO PC every 6 hours for 5 days then prn-pain     levothyroxine 75 MCG tablet  Commonly known as:  SYNTHROID, LEVOTHROID  Take 75 mcg by mouth daily before breakfast.     lovastatin 20 MG tablet  Commonly known as:  MEVACOR  Take 20 mg by mouth daily at 6 PM.     oxyCODONE-acetaminophen 5-325 MG per tablet  Commonly known as:  PERCOCET/ROXICET  Take 1-2 tablets by mouth every 4 (four) hours as needed for severe pain (moderate to severe pain).     tamoxifen 20 MG tablet  Commonly known as:  NOLVADEX  Take 20 mg by mouth daily.     zolpidem 5 MG tablet  Commonly known as:  AMBIEN  Take 5 mg by  mouth at bedtime as needed for sleep.         Follow-up: Dr. Harvie Bridge. Mancel Bale on April 07, 2015 at 2 p.m.   SignedEarnstine Regal, PA-C 02/19/2015, 7:53 AM

## 2015-02-19 NOTE — Progress Notes (Signed)
When I returned to give her the information for emergencies, she affirmed again that she plans to follow up with her primary care physician and that she has a strong will to live.  She told me about her family support as well, which is strong.  I still provided her with the information and she was grateful.  [employer] Pager, [phone] 4:13 PM    02/19/15 1500  Clinical Encounter Type  Visited With Patient  Visit Type Spiritual support  Spiritual Encounters  Spiritual Needs Emotional  Stress Factors  Patient Stress Factors (feelings of hopelessness)

## 2015-02-19 NOTE — Progress Notes (Signed)
I received a consult for pt.  Pt states that she has been crying frequently and cannot identify a reason.  She feels some sense of hopelessness, though she still reports that she has a will to live.  She has been thinking about death, but does not have a plan and does not wish to take her life.  She reported that this feeling occurred for the first time 6 months ago and at that time, she spoke with her primary care physician about this. She was not given any medication or referrals for counseling at that time.   It then subsided, but returned about a month ago.    I asked her if she would like a referral for counseling.  She declined and said she would prefer to follow up with her primary physician.  I am giving her information for walk-in clinic as well as telling her to go to the emergency department if there is any emergency.    I consulted with her RN, Vonzella Nipple as well as Terri Piedra, LCSW as well.  [address] Janne Napoleon, Bcc Pager, [phone] 3:53 PM    02/19/15 1500  Clinical Encounter Type  Visited With Patient  Visit Type Spiritual support  Spiritual Encounters  Spiritual Needs Emotional  Stress Factors  Patient Stress Factors (feelings of hopelessness)

## 2015-02-20 ENCOUNTER — Encounter (HOSPITAL_COMMUNITY): Payer: Self-pay | Admitting: Obstetrics and Gynecology

## 2015-02-20 LAB — BASIC METABOLIC PANEL
Anion gap: 7 (ref 5–15)
BUN: 13 mg/dL (ref 6–20)
CALCIUM: 8.2 mg/dL — AB (ref 8.9–10.3)
CO2: 31 mmol/L (ref 22–32)
Chloride: 104 mmol/L (ref 101–111)
Creatinine, Ser: 0.64 mg/dL (ref 0.44–1.00)
Glucose, Bld: 109 mg/dL — ABNORMAL HIGH (ref 65–99)
POTASSIUM: 3.5 mmol/L (ref 3.5–5.1)
Sodium: 142 mmol/L (ref 135–145)

## 2015-02-20 LAB — CBC
HCT: 29.5 % — ABNORMAL LOW (ref 36.0–46.0)
HEMOGLOBIN: 10.1 g/dL — AB (ref 12.0–15.0)
MCH: 30.9 pg (ref 26.0–34.0)
MCHC: 34.2 g/dL (ref 30.0–36.0)
MCV: 90.2 fL (ref 78.0–100.0)
Platelets: 153 10*3/uL (ref 150–400)
RBC: 3.27 MIL/uL — ABNORMAL LOW (ref 3.87–5.11)
RDW: 12.9 % (ref 11.5–15.5)
WBC: 11.6 10*3/uL — AB (ref 4.0–10.5)

## 2015-02-20 MED ORDER — IBUPROFEN 600 MG PO TABS: ORAL_TABLET | ORAL | Status: DC

## 2015-02-20 MED ORDER — OXYCODONE-ACETAMINOPHEN 5-325 MG PO TABS: 1.0000 | ORAL_TABLET | Freq: Four times a day (QID) | ORAL | Status: DC | PRN

## 2015-02-20 MED ORDER — CIPROFLOXACIN HCL 250 MG PO TABS: 250.0000 mg | ORAL_TABLET | Freq: Two times a day (BID) | ORAL | Status: DC

## 2015-02-20 NOTE — Discharge Summary (Signed)
Physician Discharge Summary  Patient ID: Tiffany Berg MRN: [id number] DOB/AGE: [birth year] 59 y.o.  Admit date: 02/18/2015 Discharge date: 02/20/2015  Admission Diagnoses: Pelvic Relaxation Discharge Diagnoses:  Active Problems:   Pelvic prolapse s/p LAVH/BSO/Cystoscopy/A-P Repair/TVT  Discharged Condition: stable  Hospital Course: Pt is s/p the above surgery and doing well today on POD#2.  She is tolerating po without n/v and she is afebrile, passing flatus and ambulating without difficulty.  Her abdomen seems a bit more bloated today and pt and daughter saying she is eating heavier food than she normally eats.  I gave patient instructions and want to see her in the office this coming week.  She is also voiding without difficulty but says it is more frequent.  A urine culture has been sent.  Consults: None  Significant Diagnostic Studies: labs: Hgb stable at 10  Treatments: IV hydration, antibiotics: Ancef, analgesia: ibuprofen and percocet and surgery: as above  Discharge Exam: Blood pressure 90/44, pulse 68, temperature 98.2 F (36.8 C), temperature source Oral, resp. rate 16, height 5\' 2"  (1.575 m), weight 63.957 kg (141 lb), SpO2 95 %. General appearance: alert and no distress Resp: clear to auscultation bilaterally Cardio: regular rate and rhythm GI: soft, NT, ?mildly bloated, NABS, incision c/d/intact with liquabond Extremities: no calf tenderness Incision/Wound:c/d/i  Disposition: Final discharge disposition not confirmed     Medication List    TAKE these medications        aspirin EC 81 MG tablet  Take 81 mg by mouth daily.     ibuprofen 600 MG tablet  Commonly known as:  ADVIL,MOTRIN  1 PO PC every 6 hours for 24-48 hours then as needed     levothyroxine 75 MCG tablet  Commonly known as:  SYNTHROID, LEVOTHROID  Take 75 mcg by mouth daily before breakfast.     lovastatin 20 MG tablet  Commonly known as:  MEVACOR  Take 20 mg by mouth daily at 6 PM.     oxyCODONE-acetaminophen 5-325 MG per tablet  Commonly known as:  PERCOCET/ROXICET  Take 1 tablet by mouth every 6 (six) hours as needed for severe pain (moderate to severe pain).     tamoxifen 20 MG tablet  Commonly known as:  NOLVADEX  Take 20 mg by mouth daily.     zolpidem 5 MG tablet  Commonly known as:  AMBIEN  Take 5 mg by mouth at bedtime as needed for sleep. Ciprofloxacin 250mg  po q12hrs x 3days Colace (OTC) one tablet three times a day Iron (OTC) tablet once a day           Follow-up Information    Follow up with Delice Lesch, MD On 04/07/2015.   Specialty:  Obstetrics and Gynecology   Why:  Appointment time:   2 p.m.   Contact information:   [address] [place] [address] [phone]       Follow up with Delice Lesch, MD In 5 days.   Specialty:  Obstetrics and Gynecology   Why:  Pt needs to schedule appointment to see me in the office this coming week   Contact information:   [address] [place] [address] [phone]       Signed: Delice Lesch 02/20/2015, 12:09 PM

## 2015-02-20 NOTE — Discharge Instructions (Signed)
Call [facility] @ [phone] if:  You have a temperature greater than or equal to 100.0 degrees Farenheit orally (Check temperature twice a day) You have pain that is not made better by the pain medication given and taken as directed You have excessive bleeding or problems urinating  Take Colace (Docusate Sodium/Stool Softener) 100 mg 2-3 times daily while taking narcotic pain medicine to avoid constipation or until bowel movements are regular. Take over the counter iron supplements twice daily for the next 8 weeks  You may drive after 2 weeks You may walk up steps  You may shower  You may resume a regular diet  Keep incisions clean and dry and remove honeycomb dressing after 7 days Do not lift over 15 pounds for 6 weeks Avoid anything in vagina for 6 weeks (or until after your post-operative visit)  Keep follow up appointment with Dr. Mancel Bale on April 07, 2015 at 2 p.m.

## 2015-02-20 NOTE — Progress Notes (Signed)
Pt ambulated out of the hospital and discharged home to family.

## 2015-02-20 NOTE — Progress Notes (Signed)
Discharge teaching complete with the use of interpretor. Pt understood all instructions and questions were answers.

## 2015-02-21 LAB — URINE CULTURE: Culture: >=100000

## 2015-02-23 ENCOUNTER — Inpatient Hospital Stay (HOSPITAL_COMMUNITY): Payer: Self-pay

## 2015-02-23 ENCOUNTER — Encounter (HOSPITAL_COMMUNITY): Payer: Self-pay

## 2015-02-23 ENCOUNTER — Inpatient Hospital Stay (HOSPITAL_COMMUNITY)
Admission: AD | Admit: 2015-02-23 | Discharge: 2015-02-23 | Disposition: A | Discharge status: Home / Self Care | Payer: Self-pay | Source: Ambulatory Visit | Attending: Obstetrics and Gynecology | Admitting: Obstetrics and Gynecology

## 2015-02-23 DIAGNOSIS — R140 Abdominal distension (gaseous): Secondary | ICD-10-CM

## 2015-02-23 DIAGNOSIS — G8918 Other acute postprocedural pain: Secondary | ICD-10-CM | POA: Insufficient documentation

## 2015-02-23 DIAGNOSIS — R109 Unspecified abdominal pain: Secondary | ICD-10-CM | POA: Insufficient documentation

## 2015-02-23 LAB — CBC WITH DIFFERENTIAL/PLATELET
Basophils Absolute: 0 10*3/uL (ref 0.0–0.1)
Basophils Relative: 0 % (ref 0–1)
EOS PCT: 2 % (ref 0–5)
Eosinophils Absolute: 0.1 10*3/uL (ref 0.0–0.7)
HCT: 31.2 % — ABNORMAL LOW (ref 36.0–46.0)
Hemoglobin: 10.8 g/dL — ABNORMAL LOW (ref 12.0–15.0)
LYMPHS ABS: 1 10*3/uL (ref 0.7–4.0)
LYMPHS PCT: 15 % (ref 12–46)
MCH: 31.3 pg (ref 26.0–34.0)
MCHC: 34.6 g/dL (ref 30.0–36.0)
MCV: 90.4 fL (ref 78.0–100.0)
Monocytes Absolute: 0.4 10*3/uL (ref 0.1–1.0)
Monocytes Relative: 7 % (ref 3–12)
NEUTROS PCT: 76 % (ref 43–77)
Neutro Abs: 4.9 10*3/uL (ref 1.7–7.7)
Platelets: 224 10*3/uL (ref 150–400)
RBC: 3.45 MIL/uL — AB (ref 3.87–5.11)
RDW: 12.9 % (ref 11.5–15.5)
WBC: 6.5 10*3/uL (ref 4.0–10.5)

## 2015-02-23 LAB — COMPREHENSIVE METABOLIC PANEL
ALK PHOS: 70 U/L (ref 38–126)
ALT: 38 U/L (ref 14–54)
ANION GAP: 6 (ref 5–15)
AST: 43 U/L — ABNORMAL HIGH (ref 15–41)
Albumin: 3.5 g/dL (ref 3.5–5.0)
BILIRUBIN TOTAL: 0.4 mg/dL (ref 0.3–1.2)
BUN: 17 mg/dL (ref 6–20)
CO2: 28 mmol/L (ref 22–32)
Calcium: 8.6 mg/dL — ABNORMAL LOW (ref 8.9–10.3)
Chloride: 105 mmol/L (ref 101–111)
Creatinine, Ser: 0.74 mg/dL (ref 0.44–1.00)
GLUCOSE: 183 mg/dL — AB (ref 65–99)
POTASSIUM: 3.7 mmol/L (ref 3.5–5.1)
Sodium: 139 mmol/L (ref 135–145)
Total Protein: 6.2 g/dL — ABNORMAL LOW (ref 6.5–8.1)

## 2015-02-23 LAB — URINALYSIS, ROUTINE W REFLEX MICROSCOPIC
Bilirubin Urine: NEGATIVE
Glucose, UA: NEGATIVE mg/dL
Ketones, ur: NEGATIVE mg/dL
Nitrite: NEGATIVE
PH: 7.5 (ref 5.0–8.0)
Protein, ur: 30 mg/dL — AB
SPECIFIC GRAVITY, URINE: 1.01 (ref 1.005–1.030)
UROBILINOGEN UA: 0.2 mg/dL (ref 0.0–1.0)

## 2015-02-23 LAB — URINE MICROSCOPIC-ADD ON

## 2015-02-23 MED ORDER — IOHEXOL 300 MG/ML  SOLN
100.0000 mL | Freq: Once | INTRAMUSCULAR | Status: AC | PRN
  Administered 2015-02-23: 100 mL via INTRAVENOUS

## 2015-02-23 MED ORDER — KETOROLAC TROMETHAMINE 60 MG/2ML IM SOLN
30.0000 mg | Freq: Once | INTRAMUSCULAR | Status: AC
  Administered 2015-02-23: 30 mg via INTRAMUSCULAR
  Filled 2015-02-23: qty 2

## 2015-02-23 MED ORDER — IOHEXOL 300 MG/ML  SOLN: 50.0000 mL | INTRAMUSCULAR | Status: AC

## 2015-02-23 MED ORDER — MAGNESIUM CITRATE PO SOLN
1.0000 | Freq: Once | ORAL | Status: AC
  Administered 2015-02-23: 1 via ORAL
  Filled 2015-02-23: qty 296

## 2015-02-23 NOTE — MAU Note (Signed)
Hysterectomy on 6/16. Abdominal pain x 2 hours. Denies fever at home.

## 2015-02-23 NOTE — Discharge Instructions (Signed)
Dieta liquida blanca por [place] su ibuprofeno como lo necesite para el dolor No tome la pastila de [place]!!! No tome la pastilla de hierro por [employer]  Al menos que usted pueda hacer sus necesidades defecar normalmente tome 1 al dia [place] su [employer] veces al dia  Llame a su Doctor si siente con fiebre, vomito o se siente peor Vaya a la oficina del Dr Stormy Card [place] a las 8:30 am       Estreimiento (Constipation) Estreimiento significa que una persona tiene menos de tres evacuaciones en una semana, dificultad para defecar, o que las heces son secas, duras, o ms grandes que lo normal. A medida que envejecemos el estreimiento es ms comn. Si intenta curar el estreimiento con medicamentos que producen la evacuacin de las heces (laxantes), el problema puede empeorar. El uso prolongado de laxantes puede hacer que los msculos del colon se debiliten. Una dieta baja en fibra, no tomar suficientes lquidos y el uso de ciertos medicamentos pueden Agricultural engineer.  CAUSAS   Ciertos medicamentos, como los antidepresivos, analgsicos, suplementos de hierro, anticidos y diurticos.  Algunas enfermedades, como la diabetes, el sndrome del colon irritable, enfermedad de la tiroides, o depresin.  No beber suficiente agua.  No consumir suficientes alimentos ricos en fibra.  Situaciones de estrs o viajes.  Falta de actividad fsica o de ejercicio.  Ignorar la necesidad sbita de Landscape architect.  Uso en exceso de laxantes. SIGNOS Y SNTOMAS   Defecar menos de tres veces por semana.  Dificultad para defecar.  Tener las heces secas y duras, o ms grandes que las normales.  Sensacin de estar lleno o hinchado.  Dolor en la parte baja del abdomen.  No sentir alivio despus de defecar. DIAGNSTICO  El mdico le har una historia clnica y un examen fsico. Pueden hacerle exmenes adicionales para el estreimiento grave. Estos estudios pueden ser:  Un  radiografa con enema de bario para examinar el recto, el colon y, en algunos casos, el intestino delgado.  Una sigmoidoscopia para examinar el colon inferior.  Una colonoscopia para examinar todo el colon. TRATAMIENTO  El tratamiento depender de la gravedad del estreimiento y de la causa. Algunos tratamientos nutricionales son beber ms lquidos y comer ms alimentos ricos en fibra. El cambio en el estilo de vida incluye hacer ejercicios de [place] regular. Si estas recomendaciones para Animator dieta y en el estilo de vida no ayudan, el mdico le puede indicar el uso de laxantes de venta libre para ayudarlo a Landscape architect. Los medicamentos recetados se pueden prescribir si los medicamentos de venta libre no lo [place].  INSTRUCCIONES PARA EL CUIDADO EN EL HOGAR   Consuma alimentos con alto contenido de [place], como frutas, vegetales, cereales integrales y porotos.  Limite los alimentos procesados ricos en grasas y azcar, como las papas fritas, hamburguesas, galletas, dulces y refrescos.  Puede agregar un suplemento de fibra a su dieta si no obtiene lo suficiente de los alimentos.  Beba suficiente lquido para Consulting civil engineer orina clara o de color amarillo plido.  Haga ejercicio regularmente o segn las indicaciones del mdico.  Vaya al bao cuando sienta la necesidad de ir. No se aguante las ganas.  Tome solo medicamentos de venta libre o recetados, segn las indicaciones del mdico. No tome otros medicamentos para el estreimiento sin consultarlo antes con su mdico. SOLICITE ATENCIN MDICA DE INMEDIATO SI:   Observa sangre brillante en las heces.  El estreimiento dura ms de 4  das o [place].  Siente dolor abdominal o rectal.  Las heces son delgadas como un lpiz.  Pierde peso de [place] inexplicable. ASEGRESE DE QUE:   Comprende estas instrucciones.  Controlar su afeccin.  Recibir ayuda de inmediato si no mejora o si empeora. Document Released: 09/10/2007  Document Revised: 08/26/2013 [facility] Patient Information 2015 [place]. This information is not intended to replace advice given to you by your health care provider. Make sure you discuss any questions you have with your health care provider.   Dieta de lquidos claros (Clear Liquid Diet) Una dieta de lquidos claros es una dieta a corto plazo que se indica para Statistician lquido y la energa bsica que se necesitan cuando no puede ingerir otra cosa. La dieta de lquidos claros consiste en lquidos o slidos que se transforman en lquidos a Engineer, water. Debe poder ver a travs del lquido. Hay varios motivos por los que se lo puede limitar a lquidos claros, por ejemplo:  Cuando tiene una enfermedad repentina (aguda) antes o despus de [place].  Para ayudar al cuerpo a volver a adaptarse lentamente a los alimentos despus de un perodo prolongado sin poder consumirlos.  Reemplazo de lquidos cuando tiene una enfermedad diarreica.  Cuando se Metallurgist determinados estudios, como una colonoscopa, en la que se insertan instrumentos dentro del cuerpo para observar las partes del [place]. QU PUEDO INGERIR? Una dieta de lquidos claros no proporciona todos los nutrientes que usted necesita. Es importante escoger una variedad de las siguientes opciones para obtener la mayor cantidad posible de nutrientes.  Jugos de vegetales sin pulpa.  Jugos de frutas y bebidas preparadas con frutas sin pulpa.  Caf (comn o descafeinado), t o gaseosa segn el criterio de su mdico.  Caldos claros o sopas a base de caldos.  Gelatinas saborizadas y ricas en protenas.  Azcar o miel.  Sorbetes o helados de agua que no [employer]. Si no est seguro de si puede consumir algunos lquidos, debe preguntarle a su mdico. Tambin puede preguntarle al mdico si hay otras opciones de lquidos claros. Document Released: 08/21/2005 Document Revised:  08/26/2013 [facility] Patient Information 2015 [place]. This information is not intended to replace advice given to you by your health care provider. Make sure you discuss any questions you have with your health care provider.

## 2015-02-24 NOTE — MAU Provider Note (Signed)
History     CSN: [id number]  Arrival date and time: 02/23/15 1559   None     Chief Complaint  Patient presents with  . Post-op Problem   HPI Pt presents c/o severe abdominal pain to the point of tears that started earlier today.  Her daughter reports no BM since going home.  The pt has had some recent nausea but no vomiting, fevers, chills and reports passing flatus every day since discharge except today.  OB History    No data available      Past Medical History  Diagnosis Date  . Hypothyroidism   . Depression     undiagnosed- pt feels like crying sometimes for no reason  . Headache   . Cancer     left breast    Past Surgical History  Procedure Laterality Date  . Breast surgery    . Laparoscopic assisted vaginal hysterectomy N/A 02/18/2015    Procedure: LAPAROSCOPIC ASSISTED VAGINAL HYSTERECTOMY;  Surgeon: Everett Graff, MD;  Location: [facility];  Service: Gynecology;  Laterality: N/A;  . Salpingoophorectomy Bilateral 02/18/2015    Procedure: BILATERAL SALPINGO OOPHORECTOMY;  Surgeon: Everett Graff, MD;  Location: [facility];  Service: Gynecology;  Laterality: Bilateral;  . Bladder suspension N/A 02/18/2015    Procedure: TRANSVAGINAL TAPE (TVT) PROCEDURE;  Surgeon: Everett Graff, MD;  Location: [facility];  Service: Gynecology;  Laterality: N/A;  . Cystoscopy Bilateral 02/18/2015    Procedure: CYSTOSCOPY;  Surgeon: Everett Graff, MD;  Location: [facility];  Service: Gynecology;  Laterality: Bilateral;  . Anterior and posterior repair N/A 02/18/2015    Procedure: ANTERIOR (CYSTOCELE) AND POSTERIOR REPAIR (RECTOCELE);  Surgeon: Everett Graff, MD;  Location: [facility];  Service: Gynecology;  Laterality: N/A;    History reviewed. No pertinent family history.  History  Substance Use Topics  . Smoking status: Never Smoker   . Smokeless tobacco: Not on file  . Alcohol Use: Yes     Comment: socially    Allergies: No Known Allergies  No prescriptions prior to admission    ROS  As  above and pt denies any changes in appetite  Physical Exam   Blood pressure 148/58, pulse 102, temperature 97.7 F (36.5 C), temperature source Oral, resp. rate 20.  Physical Exam Lungs CTA CV RRR Abdomen soft, dressings c/d/i, mildly distended, no rebound or guarding, diffuse discomfort Ext no calf tendernss No vaginal bleeding Rectal with large amount of stool  MAU Course  Procedures  Enema Mg Citrate CT Scan UA/Cx LABs  Assessment and Plan  S/p LAVH/BSO/A-P Repair/TVT last week and well known to me.  Language barrier but daughter translates.  Pt severely constipated.  Soap suds enema and fleets enema given by RN with no relief.  I disimpacted the patient and placed tubing for soap suds enema about 6-8cm into rectum and pt had immediate BM and some relief.  She then went to void after mg citrate and had another BM and reported no pain anymore just soreness.  I discussed possibly holding off on CT scan since she had some relief but I was concerned about her bloating and severity of pain and she wanted to proceed with CT scan.  RN to call me with results of CT scan once returns and for disposition at that time.  Delice Lesch 02/24/2015, 6:40 PM   CT scan reported to me as post op changes and small hematoma but no ileus or obstruction.  I discharged pt and told her to come to office Friday morning  for eval.  Pt instructed to avoid iron for 2wks or until BMs are regular and to avoid narcotics if possible and just take the ibuprofen.  Pt and daughter verbalized understanding and will cont to take temps BID and call if sxs recur or any problems.

## 2015-02-25 LAB — URINE CULTURE

## 2019-06-28 ENCOUNTER — Other Ambulatory Visit: Payer: Self-pay

## 2019-06-28 ENCOUNTER — Emergency Department (HOSPITAL_COMMUNITY): Payer: Medicare Other

## 2019-06-28 ENCOUNTER — Emergency Department (HOSPITAL_COMMUNITY)
Admission: EM | Admit: 2019-06-28 | Discharge: 2019-06-28 | Disposition: A | Discharge status: Home / Self Care | Payer: Medicare Other | Attending: Emergency Medicine | Admitting: Emergency Medicine

## 2019-06-28 ENCOUNTER — Encounter (HOSPITAL_COMMUNITY): Payer: Self-pay | Admitting: Emergency Medicine

## 2019-06-28 DIAGNOSIS — R531 Weakness: Secondary | ICD-10-CM | POA: Diagnosis not present

## 2019-06-28 DIAGNOSIS — Z853 Personal history of malignant neoplasm of breast: Secondary | ICD-10-CM | POA: Insufficient documentation

## 2019-06-28 DIAGNOSIS — R0602 Shortness of breath: Secondary | ICD-10-CM

## 2019-06-28 DIAGNOSIS — Z7982 Long term (current) use of aspirin: Secondary | ICD-10-CM | POA: Diagnosis not present

## 2019-06-28 DIAGNOSIS — R519 Headache, unspecified: Secondary | ICD-10-CM | POA: Diagnosis present

## 2019-06-28 LAB — CBC
HCT: 41.1 % (ref 36.0–46.0)
Hemoglobin: 14.4 g/dL (ref 12.0–15.0)
MCH: 32.1 pg (ref 26.0–34.0)
MCHC: 35 g/dL (ref 30.0–36.0)
MCV: 91.7 fL (ref 80.0–100.0)
Platelets: 246 10*3/uL (ref 150–400)
RBC: 4.48 MIL/uL (ref 3.87–5.11)
RDW: 13.1 % (ref 11.5–15.5)
WBC: 5.6 10*3/uL (ref 4.0–10.5)
nRBC: 0 % (ref 0.0–0.2)

## 2019-06-28 LAB — COMPREHENSIVE METABOLIC PANEL
ALT: 42 U/L (ref 0–44)
AST: 62 U/L — ABNORMAL HIGH (ref 15–41)
Albumin: 3.9 g/dL (ref 3.5–5.0)
Alkaline Phosphatase: 85 U/L (ref 38–126)
Anion gap: 13 (ref 5–15)
BUN: 14 mg/dL (ref 8–23)
CO2: 27 mmol/L (ref 22–32)
Calcium: 9.6 mg/dL (ref 8.9–10.3)
Chloride: 100 mmol/L (ref 98–111)
Creatinine, Ser: 0.66 mg/dL (ref 0.44–1.00)
GFR calc Af Amer: >60 mL/min (ref >60)
GFR calc non Af Amer: >60 mL/min (ref >60)
Glucose, Bld: 138 mg/dL — ABNORMAL HIGH (ref 70–99)
Potassium: 3.7 mmol/L (ref 3.5–5.1)
Sodium: 140 mmol/L (ref 135–145)
Total Bilirubin: 0.5 mg/dL (ref 0.3–1.2)
Total Protein: 7 g/dL (ref 6.5–8.1)

## 2019-06-28 LAB — DIFFERENTIAL
Abs Immature Granulocytes: 0.01 10*3/uL (ref 0.00–0.07)
Basophils Absolute: 0 10*3/uL (ref 0.0–0.1)
Basophils Relative: 0 %
Eosinophils Absolute: 0.1 10*3/uL (ref 0.0–0.5)
Eosinophils Relative: 1 %
Immature Granulocytes: 0 %
Lymphocytes Relative: 34 %
Lymphs Abs: 1.9 10*3/uL (ref 0.7–4.0)
Monocytes Absolute: 0.5 10*3/uL (ref 0.1–1.0)
Monocytes Relative: 9 %
Neutro Abs: 3.1 10*3/uL (ref 1.7–7.7)
Neutrophils Relative %: 56 %

## 2019-06-28 LAB — PROTIME-INR
INR: 1 (ref 0.8–1.2)
Prothrombin Time: 13.5 seconds (ref 11.4–15.2)

## 2019-06-28 LAB — BRAIN NATRIURETIC PEPTIDE: B Natriuretic Peptide: 45.4 pg/mL (ref 0.0–100.0)

## 2019-06-28 LAB — APTT: aPTT: 30 seconds (ref 24–36)

## 2019-06-28 MED ORDER — ZOLPIDEM TARTRATE 5 MG PO TABS: 5.0000 mg | ORAL_TABLET | Freq: Every evening | ORAL | 0 refills | Status: DC | PRN

## 2019-06-28 MED ORDER — SODIUM CHLORIDE 0.9% FLUSH: 3.0000 mL | Freq: Once | INTRAVENOUS | Status: DC

## 2019-06-28 MED ORDER — METOCLOPRAMIDE HCL 5 MG/ML IJ SOLN
10.0000 mg | Freq: Once | INTRAMUSCULAR | Status: AC
  Administered 2019-06-28: 10 mg via INTRAVENOUS
  Filled 2019-06-28: qty 2

## 2019-06-28 MED ORDER — KETOROLAC TROMETHAMINE 15 MG/ML IJ SOLN
15.0000 mg | Freq: Once | INTRAMUSCULAR | Status: AC
  Administered 2019-06-28: 15 mg via INTRAVENOUS
  Filled 2019-06-28: qty 1

## 2019-06-28 MED ORDER — IBUPROFEN 600 MG PO TABS: 600.0000 mg | ORAL_TABLET | Freq: Four times a day (QID) | ORAL | 0 refills | Status: DC | PRN

## 2019-06-28 NOTE — ED Triage Notes (Addendum)
C/o headache, SOB, generalized weakness, bilateral joint pain/swelling, and R sided arm and leg numbness x 1 week.  Negative COVID test 1 week ago.  No arm drift.

## 2019-06-28 NOTE — Discharge Instructions (Addendum)
We saw in the ER for shortness of breath, body aches/bone pain and headaches.  We recommend that you follow-up with neurology for headaches given your age.  We also recommend that you follow-up with your primary care doctor for further evaluation of shortness of breath and the bony aches.  Take ibuprofen 600 mg every 6 hours for the next 2 to 3 days.  We appreciated that your oxygen saturation was 93 to 95% in the ER.  We would like you to have your primary care doctor recheck your oxygen saturation when you see them in the clinic.  Return to the ER immediately if you start having worsening shortness of breath, fainting spells, severe chest pain or bloody sputum with cough.

## 2019-06-28 NOTE — ED Notes (Signed)
Patient Alert and oriented to baseline. Stable and ambulatory to baseline. Patient verbalized understanding of the discharge instructions.  Patient belongings were taken by the patient.   

## 2019-06-30 ENCOUNTER — Telehealth: Payer: Self-pay

## 2019-06-30 NOTE — Telephone Encounter (Signed)
Tiffany Berg- okay to schedule NP appt at her convenience. Thank you.

## 2019-06-30 NOTE — Telephone Encounter (Signed)
Copied from [place] [phone]. Topic: Appointment Scheduling - Scheduling Inquiry for Clinic >> Jun 30, 2019  4:12 PM Rainey Pines A wrote: Patient wants to know if there is any way that Dr .Larose Kells will take her on as a new patient. Please advise.

## 2019-06-30 NOTE — Telephone Encounter (Signed)
Ok to accept?

## 2019-07-03 NOTE — ED Provider Notes (Signed)
[facility] EMERGENCY DEPARTMENT Provider Note   CSN: [id number] Arrival date & time: 06/28/19  1506     History   Chief Complaint Chief Complaint  Patient presents with  . Headache  . Shortness of Breath  . Generalized Body Aches    HPI Tiffany Berg is a 63 y.o. female.     HPI  63 year old female comes in a chief complaint of headache, shortness of breath, body aches. Translation service was utilized.  Patient's daughter also helps.  According to the patient she has been having headaches for the last 2 or 3 days.  Headaches have been off and on, but now constant.  There is no specific evoking, aggravating or relieving factors.  Specifically the headaches are not positional.  She also reports that she has been having worsening shortness of breath.  Shortness of breath is typically exertional.  She has no associated chest pain.  Finally, patient also has been having body aches and pain for a long time now.  She does not have a PCP at the moment.  She was seeing someone at [facility] and informs me that she does not have any significant medical problems.  She has not taken any medication for headache.  There is no associated new numbness, tingling, weakness, vision change.  Past Medical History:  Diagnosis Date  . Cancer ([place])    left breast  . Depression    undiagnosed- pt feels like crying sometimes for no reason  . Headache   . Hypothyroidism     Patient Active Problem List   Diagnosis Date Noted  . Pelvic prolapse 02/18/2015    Past Surgical History:  Procedure Laterality Date  . ANTERIOR AND POSTERIOR REPAIR N/A 02/18/2015   Procedure: ANTERIOR (CYSTOCELE) AND POSTERIOR REPAIR (RECTOCELE);  Surgeon: Everett Graff, MD;  Location: [facility];  Service: Gynecology;  Laterality: N/A;  . BLADDER SUSPENSION N/A 02/18/2015   Procedure: TRANSVAGINAL TAPE (TVT) PROCEDURE;  Surgeon: Everett Graff, MD;  Location: [facility];  Service: Gynecology;  Laterality:  N/A;  . BREAST SURGERY    . CYSTOSCOPY Bilateral 02/18/2015   Procedure: CYSTOSCOPY;  Surgeon: Everett Graff, MD;  Location: [facility];  Service: Gynecology;  Laterality: Bilateral;  . LAPAROSCOPIC ASSISTED VAGINAL HYSTERECTOMY N/A 02/18/2015   Procedure: LAPAROSCOPIC ASSISTED VAGINAL HYSTERECTOMY;  Surgeon: Everett Graff, MD;  Location: [facility];  Service: Gynecology;  Laterality: N/A;  . SALPINGOOPHORECTOMY Bilateral 02/18/2015   Procedure: BILATERAL SALPINGO OOPHORECTOMY;  Surgeon: Everett Graff, MD;  Location: [facility];  Service: Gynecology;  Laterality: Bilateral;     OB History   No obstetric history on file.      Home Medications    Prior to Admission medications   Medication Sig Start Date End Date Taking? Authorizing Provider  aspirin EC 81 MG tablet Take 81 mg by mouth daily.    [provider]  ciprofloxacin (CIPRO) 250 MG tablet Take 1 tablet (250 mg total) by mouth every 12 (twelve) hours. Patient not taking: Reported on 02/23/2015 02/20/15   Everett Graff, MD  docusate sodium (COLACE) 100 MG capsule Take 100 mg by mouth 3 (three) times daily.     [provider]  ferrous sulfate 325 (65 FE) MG tablet Take 325 mg by mouth daily with breakfast.    [provider]  ibuprofen (ADVIL) 600 MG tablet Take 1 tablet (600 mg total) by mouth every 6 (six) hours as needed. 06/28/19   Varney Biles, MD  levothyroxine (SYNTHROID, LEVOTHROID) 75 MCG  tablet Take 75 mcg by mouth daily before breakfast.    [provider]  lovastatin (MEVACOR) 20 MG tablet Take 20 mg by mouth daily at 6 PM.    [provider]  oxyCODONE-acetaminophen (PERCOCET/ROXICET) 5-325 MG per tablet Take 1 tablet by mouth every 6 (six) hours as needed for severe pain (moderate to severe pain). 02/20/15   Everett Graff, MD  tamoxifen (NOLVADEX) 20 MG tablet Take 20 mg by mouth daily.    [provider]  zolpidem (AMBIEN) 5 MG tablet Take 1 tablet (5 mg total) by mouth  at bedtime as needed for sleep. 06/28/19   Varney Biles, MD    Family History No family history on file.  Social History Social History   Tobacco Use  . Smoking status: Never Smoker  . Smokeless tobacco: Never Used  Substance Use Topics  . Alcohol use: Yes    Comment: socially  . Drug use: No     Allergies   Patient has no known allergies.   Review of Systems Review of Systems  Constitutional: Positive for activity change.  Respiratory: Positive for shortness of breath.   Gastrointestinal: Positive for abdominal pain.  Allergic/Immunologic: Negative for immunocompromised state.  Neurological: Positive for headaches.  Hematological: Does not bruise/bleed easily.  All other systems reviewed and are negative.    Physical Exam Updated Vital Signs BP 124/62   Pulse (!) 50   Temp 98 F (36.7 C) (Oral)   Resp 15   SpO2 95%   Physical Exam Vitals signs and nursing note reviewed.  Constitutional:      Appearance: She is well-developed.  HENT:     Head: Normocephalic and atraumatic.  Eyes:     General: No visual field deficit.    Extraocular Movements: Extraocular movements intact.     Pupils: Pupils are equal, round, and reactive to light.  Neck:     Musculoskeletal: Normal range of motion and neck supple.  Cardiovascular:     Rate and Rhythm: Normal rate.  Pulmonary:     Effort: Pulmonary effort is normal. No respiratory distress.     Breath sounds: No wheezing, rhonchi or rales.  Abdominal:     General: Bowel sounds are normal.  Skin:    General: Skin is warm and dry.  Neurological:     Mental Status: She is alert and oriented to person, place, and time.     GCS: GCS eye subscore is 4. GCS verbal subscore is 5. GCS motor subscore is 6.     Cranial Nerves: No cranial nerve deficit, dysarthria or facial asymmetry.     Sensory: No sensory deficit.     Motor: No weakness.     Coordination: Coordination normal.     Gait: Gait normal.      ED  Treatments / Results  Labs (all labs ordered are listed, but only abnormal results are displayed) Labs Reviewed  COMPREHENSIVE METABOLIC PANEL - Abnormal; Notable for the following components:      Result Value   Glucose, Bld 138 (*)    AST 62 (*)    All other components within normal limits  PROTIME-INR  APTT  CBC  DIFFERENTIAL  BRAIN NATRIURETIC PEPTIDE    EKG EKG Interpretation  Date/Time:  Saturday June 28 2019 15:43:41 EDT Ventricular Rate:  73 PR Interval:  170 QRS Duration: 70 QT Interval:  418 QTC Calculation: 460 R Axis:   36 Text Interpretation:  Normal sinus rhythm Cannot rule out Anterior infarct ,  age undetermined Abnormal ECG No acute changes No significant change since last tracing Confirmed by Varney Biles [phone]) on 06/28/2019 5:33:12 PM   Radiology No results found.  Procedures Procedures (including critical care time)  Medications Ordered in ED Medications  ketorolac (TORADOL) 15 MG/ML injection 15 mg (15 mg Intravenous Given 06/28/19 1854)  metoCLOPramide (REGLAN) injection 10 mg (10 mg Intravenous Given 06/28/19 1854)     Initial Impression / Assessment and Plan / ED Course  I have reviewed the triage vital signs and the nursing notes.  Pertinent labs & imaging results that were available during my care of the patient were reviewed by me and considered in my medical decision making (see chart for details).        Patient comes in with multiple complaints, primarily she is having headaches that led her to come to the ER.  She is over the age of 5.  She denies any trauma, history of headaches.  She has not taken any medications with a headache.  There is no associated neurologic deficits.  Patient does not have any history of clotting disorder and there is no risk factor for clotting.  She has no meningismus.  Neuro exam is completely normal.  I do not think she has a subarachnoid hemorrhage, idiopathic intracranial hypertension, temporal  arteritis, cerebral venous thrombosis, meningitis.  CT scan of the brain ordered and it does not reveal any tumors or bleed.  No suspicion for stroke either.  We have advised her to follow-up with neurology for further assessment.  Additionally, she is having exertional shortness of breath that is not new.  BNP is negative.  EKG is reassuring.  Doubt ACS or PE at this time.  EKG is reassuring. Strict ER return precautions have been discussed, and patient is agreeing with the plan and is comfortable with the workup done and the recommendations from the ER.  We will also give contact formation for new PCP.   Final Clinical Impressions(s) / ED Diagnoses   Final diagnoses:  Bad headache  Shortness of breath    ED Discharge Orders         Ordered    ibuprofen (ADVIL) 600 MG tablet  Every 6 hours PRN     06/28/19 2031    zolpidem (AMBIEN) 5 MG tablet  At bedtime PRN     06/28/19 2036           Varney Biles, MD 07/03/19 1755

## 2019-07-04 NOTE — Telephone Encounter (Signed)
Pt scheduled w/ Dr. Martinique on 08/20/2019? Closing note.

## 2019-07-07 ENCOUNTER — Ambulatory Visit: Payer: Self-pay | Admitting: Primary Care

## 2019-08-19 ENCOUNTER — Other Ambulatory Visit: Payer: Self-pay

## 2019-08-20 ENCOUNTER — Encounter: Payer: Self-pay | Admitting: Family Medicine

## 2019-08-20 ENCOUNTER — Ambulatory Visit (INDEPENDENT_AMBULATORY_CARE_PROVIDER_SITE_OTHER): Payer: Medicare Other | Admitting: Family Medicine

## 2019-08-20 VITALS — BP 120/70 | HR 53 | Resp 12 | Ht 62.0 in | Wt 155.0 lb

## 2019-08-20 DIAGNOSIS — E039 Hypothyroidism, unspecified: Secondary | ICD-10-CM | POA: Diagnosis not present

## 2019-08-20 DIAGNOSIS — Z23 Encounter for immunization: Secondary | ICD-10-CM

## 2019-08-20 DIAGNOSIS — Z9189 Other specified personal risk factors, not elsewhere classified: Secondary | ICD-10-CM

## 2019-08-20 DIAGNOSIS — Z780 Asymptomatic menopausal state: Secondary | ICD-10-CM

## 2019-08-20 DIAGNOSIS — G4700 Insomnia, unspecified: Secondary | ICD-10-CM | POA: Diagnosis not present

## 2019-08-20 DIAGNOSIS — R7309 Other abnormal glucose: Secondary | ICD-10-CM | POA: Diagnosis not present

## 2019-08-20 DIAGNOSIS — R202 Paresthesia of skin: Secondary | ICD-10-CM

## 2019-08-20 DIAGNOSIS — M545 Low back pain, unspecified: Secondary | ICD-10-CM

## 2019-08-20 DIAGNOSIS — E785 Hyperlipidemia, unspecified: Secondary | ICD-10-CM

## 2019-08-20 DIAGNOSIS — Z1159 Encounter for screening for other viral diseases: Secondary | ICD-10-CM

## 2019-08-20 DIAGNOSIS — R200 Anesthesia of skin: Secondary | ICD-10-CM | POA: Diagnosis not present

## 2019-08-20 DIAGNOSIS — R001 Bradycardia, unspecified: Secondary | ICD-10-CM

## 2019-08-20 LAB — LIPID PANEL
Cholesterol: 266 mg/dL — ABNORMAL HIGH (ref 0–200)
HDL: 62.1 mg/dL (ref >39.00)
LDL Cholesterol: 177 mg/dL — ABNORMAL HIGH (ref 0–99)
NonHDL: 203.65
Total CHOL/HDL Ratio: 4
Triglycerides: 135 mg/dL (ref 0.0–149.0)
VLDL: 27 mg/dL (ref 0.0–40.0)

## 2019-08-20 LAB — HEMOGLOBIN A1C: Hgb A1c MFr Bld: 6 % (ref 4.6–6.5)

## 2019-08-20 LAB — VITAMIN B12: Vitamin B-12: 1262 pg/mL — ABNORMAL HIGH (ref 211–911)

## 2019-08-20 LAB — TSH: TSH: 2.42 u[IU]/mL (ref 0.35–4.50)

## 2019-08-20 MED ORDER — TRAZODONE HCL 50 MG PO TABS: 25.0000 mg | ORAL_TABLET | Freq: Every evening | ORAL | 3 refills | Status: DC | PRN

## 2019-08-20 NOTE — Progress Notes (Signed)
HPI:   [name] is a 63 y.o. female, who is here today to establish care.  Former PCP: N/A Last preventive routine visit: Gynecology preventive visit on 11/20/2016. She lives with one of her daughters.  Chronic medical problems:  -Depression in the past, denies current symptoms.  -Left breast cancer s/p lumpectomy. Currently she is on tamoxifen 20 mg daily. She follows with oncologist for 5 years. Last mammogram on 03/03/2019 BI-RADS 2.  -Hyperlipidemia: Currently she is on lovastatin 20 mg daily. She follows low fat diet.  -Hypothyroidism: Currently she is on levothyroxine 75 mcg daily. Negative for cold/heat intolerance,palpitations,tremor,or abnormal wt loss.  Elevated glucose: 138, 183.  No known history of diabetes.  Elevated transaminases. No abdominal pain,N/V,or jaundice.  Lab Results  Component Value Date   ALT 42 06/28/2019   AST 62 (H) 06/28/2019   ALKPHOS 85 06/28/2019   BILITOT 0.5 06/28/2019   Lab Results  Component Value Date   WBC 5.6 06/28/2019   HGB 14.4 06/28/2019   HCT 41.1 06/28/2019   MCV 91.7 06/28/2019   PLT 246 06/28/2019   Evaluated in the ER on 06/28/19 because "bad headache." Headache has resolved.  Concerns today: 4 weeks of bilateral LE's "achy bone" usually at night. Occasionally she has tingling in LLE. Negative for edema,erythema,rash,or skin lesions.  No fever,chills,changes in bowel habits,or urinary symptoms.  Lower back pain radiated to RLE for 4 weeks. No hx of trauma.  Negative for saddle anesthesia,weakness,or bladder/bowel dysfunction. Insomnia: Chronic problem that has been going on for years. She took Ambien before and it did help. Sleeps about 5 hours,wakes up every 2-3 hours to use bathroom. Drinks fluids at bedtime.   Review of Systems  Constitutional: Positive for fatigue. Negative for activity change and appetite change.  HENT: Negative for mouth sores, nosebleeds and trouble swallowing.    Eyes: Negative for redness and visual disturbance.  Respiratory: Negative for cough, shortness of breath and wheezing.   Cardiovascular: Negative for chest pain.  Gastrointestinal:       Negative for changes in bowel habits.  Endocrine: Negative for polydipsia, polyphagia and polyuria.  Genitourinary: Negative for decreased urine volume, dysuria and hematuria.  Musculoskeletal: Negative for gait problem.  Skin: Negative for pallor and wound.  Neurological: Negative for syncope and headaches.  Hematological: Negative for adenopathy. Does not bruise/bleed easily.  Psychiatric/Behavioral: Negative for confusion and hallucinations.  Rest see pertinent positives and negatives per HPI.   Current Outpatient Medications on File Prior to Visit  Medication Sig Dispense Refill  . aspirin EC 81 MG tablet Take 81 mg by mouth daily.    Marland Kitchen docusate sodium (COLACE) 100 MG capsule Take 100 mg by mouth 3 (three) times daily.     . ferrous sulfate 325 (65 FE) MG tablet Take 325 mg by mouth daily with breakfast.    . ibuprofen (ADVIL) 600 MG tablet Take 1 tablet (600 mg total) by mouth every 6 (six) hours as needed. 20 tablet 0  . levothyroxine (SYNTHROID, LEVOTHROID) 75 MCG tablet Take 75 mcg by mouth daily before breakfast.    . lovastatin (MEVACOR) 20 MG tablet Take 20 mg by mouth daily at 6 PM.    . tamoxifen (NOLVADEX) 20 MG tablet Take 20 mg by mouth daily.    Marland Kitchen zolpidem (AMBIEN) 5 MG tablet Take 1 tablet (5 mg total) by mouth at bedtime as needed for sleep. 10 tablet 0   No current facility-administered medications on file prior to  visit.     Past Medical History:  Diagnosis Date  . Cancer ([place])    left breast  . Depression    undiagnosed- pt feels like crying sometimes for no reason  . Headache   . Hypothyroidism    No Known Allergies  Family History  Problem Relation Age of Onset  . Heart disease Mother   . Cancer Father        prostata  . Cancer Sister 65       Breast     Social History   Socioeconomic History  . Marital status: Legally Separated    Spouse name: Not on file  . Number of children: Not on file  . Years of education: Not on file  . Highest education level: Not on file  Occupational History  . Not on file  Tobacco Use  . Smoking status: Never Smoker  . Smokeless tobacco: Never Used  Substance and Sexual Activity  . Alcohol use: Yes    Comment: socially  . Drug use: No  . Sexual activity: Yes    Birth control/protection: Post-menopausal, Surgical  Other Topics Concern  . Not on file  Social History Narrative  . Not on file   Social Determinants of Health   Financial Resource Strain:   . Difficulty of Paying Living Expenses: Not on file  Food Insecurity:   . Worried About Charity fundraiser in the Last Year: Not on file  . Ran Out of Food in the Last Year: Not on file  Transportation Needs:   . Lack of Transportation (Medical): Not on file  . Lack of Transportation (Non-Medical): Not on file  Physical Activity:   . Days of Exercise per Week: Not on file  . Minutes of Exercise per Session: Not on file  Stress:   . Feeling of Stress : Not on file  Social Connections:   . Frequency of Communication with Friends and Family: Not on file  . Frequency of Social Gatherings with Friends and Family: Not on file  . Attends Religious Services: Not on file  . Active Member of Clubs or Organizations: Not on file  . Attends Archivist Meetings: Not on file  . Marital Status: Not on file    Vitals:   08/20/19 1017  BP: 120/70  Pulse: (!) 53  Resp: 12  SpO2: 96%    Body mass index is 28.35 kg/m.   Physical Exam  Nursing note and vitals reviewed. Constitutional: She is oriented to person, place, and time. She appears well-developed and well-nourished. No distress.  HENT:  Head: Normocephalic and atraumatic.  Mouth/Throat: Oropharynx is clear and moist and mucous membranes are normal.  Eyes: Pupils are equal,  round, and reactive to light. Conjunctivae are normal.  Cardiovascular: Regular rhythm. Bradycardia present.  No murmur heard. Pulses:      Dorsalis pedis pulses are 2+ on the right side and 2+ on the left side.  Respiratory: Effort normal and breath sounds normal. No respiratory distress.  GI: Soft. She exhibits no mass. There is no hepatomegaly. There is no abdominal tenderness.  Musculoskeletal:        General: No edema.     Lumbar back: No tenderness or bony tenderness.  Lymphadenopathy:    She has no cervical adenopathy.  Neurological: She is alert and oriented to person, place, and time. She has normal strength. No cranial nerve deficit. Gait normal.  Reflex Scores:      Patellar reflexes are 2+ on  the right side and 2+ on the left side. SLR negative bilateral.  Skin: Skin is warm. No rash noted. No erythema.  Psychiatric: She has a normal mood and affect.  Well groomed, good eye contact.   ASSESSMENT AND PLAN:  [name] was seen today for establish care.  Diagnoses and all orders for this visit: Orders Placed This Encounter  Procedures  . DG Lumbar Spine Complete  . DG Bone Density  . Flu Vaccine QUAD High Dose(Fluad)  . Pneumococcal polysaccharide vaccine 23-valent greater than or equal to 2yo subcutaneous/IM  . Hemoglobin A1c  . Lipid panel  . Hepatitis C Antibody  . TSH  . Vitamin B12   Lab Results  Component Value Date   VITAMINB12 1,262 (H) 08/20/2019   Lab Results  Component Value Date   CHOL 266 (H) 08/20/2019   HDL 62.10 08/20/2019   LDLCALC 177 (H) 08/20/2019   TRIG 135.0 08/20/2019   CHOLHDL 4 08/20/2019   Lab Results  Component Value Date   TSH 2.42 08/20/2019   Lab Results  Component Value Date   HGBA1C 6.0 08/20/2019    Insomnia, unspecified type Good sleep hygiene. Trazodone side effects discussed.  -     traZODone (DESYREL) 50 MG tablet; Take 0.5-1 tablets (25-50 mg total) by mouth at bedtime as needed for sleep.  Elevated  glucose Healthy life style for diabetes prevention.  Acquired hypothyroidism No changes in current management, Levothyroxine dose will be adjusted if needed.  Numbness and tingling of right lower extremity Possible etiologies discussed. ? Radicular pain. Further recommendations will be given according to lab results.  Encounter for HCV screening test for high risk patient -     Hepatitis C Antibody  Hyperlipidemia, unspecified hyperlipidemia type No changes in current management, will follow labs done today and will give further recommendations accordingly.  Bilateral low back pain, unspecified chronicity, unspecified whether sciatica present Possible causes discussed. Musculoskeletal most likely, ? DDD. Topical icy hot or asper cream.  Sinus bradycardia Asymptomatic. EKG's in the ER done in 06/2019. Bradycardia has been present in the past. Instructed about warning signs.  Asymptomatic postmenopausal estrogen deficiency -     DG Bone Density; Future  Need for influenza vaccination -     Flu Vaccine QUAD High Dose(Fluad)  Need for 23-polyvalent pneumococcal polysaccharide vaccine -     Pneumococcal polysaccharide vaccine 23-valent greater than or equal to 2yo subcutaneous/IM     Return in about 6 weeks (around 10/01/2019).     Tiffany Berg G. Martinique, MD  [facility]. [place] office.

## 2019-08-20 NOTE — Patient Instructions (Addendum)
A few things to remember from today's visit:   Elevated glucose - Plan: Hemoglobin A1c  Acquired hypothyroidism - Plan: TSH  Numbness and tingling of right lower extremity - Plan: Hemoglobin A1c, Vitamin B12  Insomnia, unspecified type  Encounter for HCV screening test for high risk patient - Plan: Hepatitis C Antibody  Hyperlipidemia, unspecified hyperlipidemia type - Plan: Lipid panel  Bilateral low back pain, unspecified chronicity, unspecified whether sciatica present - Plan: DG Lumbar Spine Complete  Asymptomatic postmenopausal estrogen deficiency - Plan: DG Bone Density  Hoy empezamos Trazodone para dormir, 30-45 min antes de ir a la cama.   Please be sure medication list is accurate. If a new problem present, please set up appointment sooner than planned today.

## 2019-08-21 LAB — HEPATITIS C ANTIBODY
Hepatitis C Ab: NONREACTIVE
SIGNAL TO CUT-OFF: 0.01 (ref <1.00)

## 2019-08-23 MED ORDER — LEVOTHYROXINE SODIUM 75 MCG PO TABS: 75.0000 ug | ORAL_TABLET | Freq: Every day | ORAL | 3 refills | Status: DC

## 2019-08-27 ENCOUNTER — Other Ambulatory Visit: Payer: Self-pay

## 2019-08-27 MED ORDER — LOVASTATIN 40 MG PO TABS: 40.0000 mg | ORAL_TABLET | Freq: Every day | ORAL | 1 refills | Status: DC

## 2019-10-01 ENCOUNTER — Ambulatory Visit (INDEPENDENT_AMBULATORY_CARE_PROVIDER_SITE_OTHER): Payer: Medicare Other

## 2019-10-01 ENCOUNTER — Telehealth: Payer: Self-pay | Admitting: Family Medicine

## 2019-10-01 ENCOUNTER — Encounter: Payer: Self-pay | Admitting: Family Medicine

## 2019-10-01 ENCOUNTER — Ambulatory Visit (INDEPENDENT_AMBULATORY_CARE_PROVIDER_SITE_OTHER): Payer: Medicare Other | Admitting: Family Medicine

## 2019-10-01 ENCOUNTER — Other Ambulatory Visit: Payer: Self-pay

## 2019-10-01 VITALS — BP 120/70 | HR 66 | Resp 12 | Ht 62.0 in | Wt 156.0 lb

## 2019-10-01 DIAGNOSIS — M545 Low back pain: Secondary | ICD-10-CM

## 2019-10-01 DIAGNOSIS — G4700 Insomnia, unspecified: Secondary | ICD-10-CM | POA: Diagnosis not present

## 2019-10-01 DIAGNOSIS — M79651 Pain in right thigh: Secondary | ICD-10-CM

## 2019-10-01 DIAGNOSIS — R7401 Elevation of levels of liver transaminase levels: Secondary | ICD-10-CM | POA: Diagnosis not present

## 2019-10-01 DIAGNOSIS — G8929 Other chronic pain: Secondary | ICD-10-CM

## 2019-10-01 LAB — HEPATIC FUNCTION PANEL
ALT: 22 U/L (ref 0–35)
AST: 27 U/L (ref 0–37)
Albumin: 4.3 g/dL (ref 3.5–5.2)
Alkaline Phosphatase: 108 U/L (ref 39–117)
Bilirubin, Direct: 0.1 mg/dL (ref 0.0–0.3)
Total Bilirubin: 0.5 mg/dL (ref 0.2–1.2)
Total Protein: 7 g/dL (ref 6.0–8.3)

## 2019-10-01 MED ORDER — GABAPENTIN 100 MG PO CAPS: ORAL_CAPSULE | ORAL | 0 refills | Status: DC

## 2019-10-01 NOTE — Telephone Encounter (Signed)
Pt  Would like Tiffany Berg  To give her a call about her incontinence  Supply  That are needed  . Pt would like a call back  To [phone]

## 2019-10-01 NOTE — Progress Notes (Signed)
HPI:   Ms.Lakeisa Dismuke is a 64 y.o. female, who is here today to follow on recent OV. She was last seen on 08/20/19.  Trazodone 50 mg started to help with insomnia. It is helping but it stars to work around 60 min after taking it. Sleeping about 6-7 hours. No side effects reported.  Today she is c/o persistent right thigh pain that interferes with sleep. This has been going on for 7 weeks.  Not daily but frequent. 6/10 achy pain, "bone pain." Pain does not interferes with activities during the day. She has not noted rash.  Occasional lower back and right hip pain, bilateral.  This has been intermittently for a while now. Pain is not radiated. Sharp 8/10,intermittent. She has not noted limitation of ROM.  Negative for saddle anesthesia or bladder/bowel dysfunction.  She takes Advil as needed.  Mildly elevated alk phosphate. Denies high alcohol intake.  Denies abdominal pain, nausea, vomiting, changes in bowel habits, or jaundice.  Lab Results  Component Value Date   ALT 42 06/28/2019   AST 62 (H) 06/28/2019   ALKPHOS 85 06/28/2019   BILITOT 0.5 06/28/2019    Review of Systems  Constitutional: Negative for activity change, appetite change and fever.  HENT: Negative for mouth sores, nosebleeds and trouble swallowing.   Respiratory: Negative for shortness of breath and wheezing.   Cardiovascular: Negative for chest pain and leg swelling.  Gastrointestinal: Negative for abdominal distention and blood in stool.  Genitourinary: Negative for decreased urine volume, dysuria, hematuria, vaginal bleeding and vaginal discharge.  Neurological: Negative for weakness and headaches.  Hematological: Negative for adenopathy. Does not bruise/bleed easily.  Psychiatric/Behavioral: Positive for sleep disturbance. Negative for confusion.  Rest see pertinent positives and negatives per HPI.   Current Outpatient Medications on File Prior to Visit  Medication Sig Dispense  Refill  . aspirin EC 81 MG tablet Take 81 mg by mouth daily.    Marland Kitchen docusate sodium (COLACE) 100 MG capsule Take 100 mg by mouth 3 (three) times daily.     . ferrous sulfate 325 (65 FE) MG tablet Take 325 mg by mouth daily with breakfast.    . ibuprofen (ADVIL) 600 MG tablet Take 1 tablet (600 mg total) by mouth every 6 (six) hours as needed. 20 tablet 0  . levothyroxine (SYNTHROID) 75 MCG tablet Take 1 tablet (75 mcg total) by mouth daily before breakfast. 90 tablet 3  . lovastatin (MEVACOR) 40 MG tablet Take 1 tablet (40 mg total) by mouth at bedtime. 90 tablet 1  . tamoxifen (NOLVADEX) 20 MG tablet Take 20 mg by mouth daily.    . traZODone (DESYREL) 50 MG tablet Take 0.5-1 tablets (25-50 mg total) by mouth at bedtime as needed for sleep. 30 tablet 3   No current facility-administered medications on file prior to visit.     Past Medical History:  Diagnosis Date  . Cancer ([place])    left breast  . Depression    undiagnosed- pt feels like crying sometimes for no reason  . Headache   . Hypothyroidism    No Known Allergies  Social History   Socioeconomic History  . Marital status: Legally Separated    Spouse name: Not on file  . Number of children: Not on file  . Years of education: Not on file  . Highest education level: Not on file  Occupational History  . Not on file  Tobacco Use  . Smoking status: Never Smoker  . Smokeless  tobacco: Never Used  Substance and Sexual Activity  . Alcohol use: Yes    Comment: socially  . Drug use: No  . Sexual activity: Yes    Birth control/protection: Post-menopausal, Surgical  Other Topics Concern  . Not on file  Social History Narrative  . Not on file   Social Determinants of Health   Financial Resource Strain:   . Difficulty of Paying Living Expenses: Not on file  Food Insecurity:   . Worried About Charity fundraiser in the Last Year: Not on file  . Ran Out of Food in the Last Year: Not on file  Transportation Needs:   . Lack of  Transportation (Medical): Not on file  . Lack of Transportation (Non-Medical): Not on file  Physical Activity:   . Days of Exercise per Week: Not on file  . Minutes of Exercise per Session: Not on file  Stress:   . Feeling of Stress : Not on file  Social Connections:   . Frequency of Communication with Friends and Family: Not on file  . Frequency of Social Gatherings with Friends and Family: Not on file  . Attends Religious Services: Not on file  . Active Member of Clubs or Organizations: Not on file  . Attends Archivist Meetings: Not on file  . Marital Status: Not on file    Vitals:   10/01/19 1007  BP: 120/70  Pulse: 66  Resp: 12  SpO2: 98%   Body mass index is 28.53 kg/m.   Physical Exam  Nursing note and vitals reviewed. Constitutional: She is oriented to person, place, and time. She appears well-developed. No distress.  HENT:  Head: Normocephalic and atraumatic.  Eyes: Pupils are equal, round, and reactive to light. Conjunctivae are normal.  Cardiovascular: Normal rate and regular rhythm.  No murmur heard. Pulses:      Dorsalis pedis pulses are 2+ on the right side and 2+ on the left side.  Respiratory: Effort normal and breath sounds normal. No respiratory distress.  GI: Soft. She exhibits no mass. There is no hepatomegaly. There is no abdominal tenderness.  Musculoskeletal:        General: No edema.     Lumbar back: No tenderness or bony tenderness.     Right hip: No tenderness or bony tenderness. Decreased range of motion.     Comments: Right hip limitation of external and internal rotation,no pain elicited.  Lymphadenopathy:    She has no cervical adenopathy.  Neurological: She is alert and oriented to person, place, and time. She has normal strength. No cranial nerve deficit. Gait normal.  SLR negative bilateral.  Skin: Skin is warm. No rash noted. No erythema.  Psychiatric: She has a normal mood and affect.  Well groomed, good eye contact.     ASSESSMENT AND PLAN:  Ms. Lonnette was seen today for follow-up.  Diagnoses and all orders for this visit: Orders Placed This Encounter  Procedures  . MR Lumbar Spine Wo Contrast  . Hepatic function panel   Lab Results  Component Value Date   ALT 22 10/01/2019   AST 27 10/01/2019   ALKPHOS 108 10/01/2019   BILITOT 0.5 10/01/2019    Insomnia, unspecified type Problem improved with Trazodone. Today we are starting Gabapentin, which may also help with sleep. Recommend holding on Trazodone when starting Gabapentin. If she continues Trazodone ,she needs to take it 45-60 min before bedtime.  Chronic bilateral low back pain, unspecified whether sciatica present Today lumbar X ray  that was ordered last visit will be done. Because RLE pain , lumbar MRI will be arranged. Instructed about warning signs.  Right thigh pain ? Radicular pain. She agrees with starting Gabapentin, side effects discussed. Instructed to start Gabapentin 100 mg and titrate to 300 mg as tolerated. She will let me know if she tolerates 300 mg of Gabapentin,so it can be changed to 300 mg cap.  -     gabapentin (NEURONTIN) 100 MG capsule; 1 cap at bedtime daily x 10 days,increase to 2 caps x 10 d,and then 3 caps  Elevated ALT measurement Further recommendations will be given according to LFT's results.   Return in about 2 months (around 11/29/2019).    Kaisy Severino G. Martinique, MD  [facility]. [place] office.

## 2019-10-01 NOTE — Telephone Encounter (Signed)
Pt would like Tiffany Berg to give her a call about  Incontinence supply that are needed  Pt would like a call back to [phone]

## 2019-10-01 NOTE — Patient Instructions (Signed)
A few things to remember from today's visit:   Chronic bilateral low back pain, unspecified whether sciatica present - Plan: MR Lumbar Spine Wo Contrast  Right thigh pain - Plan: MR Lumbar Spine Wo Contrast, gabapentin (NEURONTIN) 100 MG capsule  Insomnia, unspecified type  Elevated ALT measurement - Plan: Hepatic function panel  Gabapentin para el dolor en la pierna. Empieze 1 cap por 10 dias, 2 caps por 10 dias, y 3 caps. El goal es 300 mg antes de irse a la cama. Este med puede ayudar a dormir, Event organiser pare Trazoodne cuendo tome Gabapentin.  Me have sabe en 4 semanas come va el dolor y si le ayudo a dormir.   Please be sure medication list is accurate. If a new problem present, please set up appointment sooner than planned today.

## 2019-10-02 DIAGNOSIS — M545 Low back pain, unspecified: Secondary | ICD-10-CM | POA: Insufficient documentation

## 2019-10-07 NOTE — Telephone Encounter (Signed)
This encounter has been resolved 

## 2019-10-15 DIAGNOSIS — H2513 Age-related nuclear cataract, bilateral: Secondary | ICD-10-CM | POA: Diagnosis not present

## 2019-10-28 ENCOUNTER — Other Ambulatory Visit: Payer: Self-pay

## 2019-10-28 ENCOUNTER — Ambulatory Visit
Admission: RE | Admit: 2019-10-28 | Discharge: 2019-10-28 | Disposition: A | Discharge status: Home / Self Care | Payer: Medicare Other | Source: Ambulatory Visit | Attending: Family Medicine | Admitting: Family Medicine

## 2019-10-28 DIAGNOSIS — G8929 Other chronic pain: Secondary | ICD-10-CM

## 2019-10-28 DIAGNOSIS — M79651 Pain in right thigh: Secondary | ICD-10-CM

## 2019-10-28 DIAGNOSIS — M545 Low back pain: Secondary | ICD-10-CM | POA: Diagnosis not present

## 2019-10-31 ENCOUNTER — Other Ambulatory Visit: Payer: Self-pay

## 2019-10-31 DIAGNOSIS — M545 Low back pain, unspecified: Secondary | ICD-10-CM

## 2019-11-12 ENCOUNTER — Ambulatory Visit (INDEPENDENT_AMBULATORY_CARE_PROVIDER_SITE_OTHER): Payer: Medicare Other | Admitting: Family Medicine

## 2019-11-12 ENCOUNTER — Encounter: Payer: Self-pay | Admitting: Family Medicine

## 2019-11-12 ENCOUNTER — Other Ambulatory Visit: Payer: Self-pay

## 2019-11-12 DIAGNOSIS — M5441 Lumbago with sciatica, right side: Secondary | ICD-10-CM | POA: Diagnosis not present

## 2019-11-12 MED ORDER — TIZANIDINE HCL 2 MG PO TABS: 2.0000 mg | ORAL_TABLET | Freq: Four times a day (QID) | ORAL | 1 refills | Status: DC | PRN

## 2019-11-12 MED ORDER — CELECOXIB 200 MG PO CAPS: 200.0000 mg | ORAL_CAPSULE | Freq: Two times a day (BID) | ORAL | 6 refills | Status: DC | PRN

## 2019-11-12 NOTE — Progress Notes (Signed)
Office Visit Note   Patient: Tiffany Berg           Date of Birth: 08/20/1956           MRN: [id number] Visit Date: 11/12/2019 Requested by: Martinique, Betty G, MD [address] [place],  [place] [address] PCP: Martinique, Betty G, MD  Subjective: Chief Complaint  Patient presents with  . Lower Back - Pain    Pain across lower back x 3 months. Radiates down the right thigh to the knee, mainly at night when lying down. Has difficulty sleeping. No recent injury. H/o fall years ago. When she stands from sitting, she must stand for a moment before walking.    HPI: She is here with right-sided low back and right leg pain.  Symptoms started about 3 months ago, no injury.  Constant pain but worse at night when trying to sleep.  Cannot seem to get a comfortable position.  She had an MRI recently which showed L4-5 disc protrusion narrowing the right foramen and possibly affecting the right L5 nerve root.  He was referred here for further evaluation and treatment.  Interpreter is with her today.              ROS: No bowel or bladder dysfunction, no fevers or chills.  All other systems were reviewed and are negative.  Objective: Vital Signs: There were no vitals taken for this visit.  Physical Exam:  General:  Alert and oriented, in no acute distress. Pulm:  Breathing unlabored. Psy:  Normal mood, congruent affect. Skin: No rash. Low back: She does have some tenderness in the midline over the L4-5 and L5-S1 levels.  There is some pain in the right sciatic notch.  Straight leg raise negative, lower extremity strength and reflexes are normal.  No pain with internal hip rotation.  Imaging: None today but recent MRI was reviewed.  Assessment & Plan: 1.  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  If symptoms persist, could switch to chiropractic or refer her for epidural steroid  injection.     Procedures: No procedures performed  No notes on file     PMFS History: Patient Active Problem List   Diagnosis Date Noted  . Bilateral low back pain 10/02/2019  . Pelvic prolapse 02/18/2015   Past Medical History:  Diagnosis Date  . Cancer ([place])    left breast  . Depression    undiagnosed- pt feels like crying sometimes for no reason  . Headache   . Hypothyroidism     Family History  Problem Relation Age of Onset  . Heart disease Mother   . Cancer Father        prostata  . Cancer Sister 76       Breast    Past Surgical History:  Procedure Laterality Date  . ANTERIOR AND POSTERIOR REPAIR N/A 02/18/2015   Procedure: ANTERIOR (CYSTOCELE) AND POSTERIOR REPAIR (RECTOCELE);  Surgeon: Everett Graff, MD;  Location: [facility];  Service: Gynecology;  Laterality: N/A;  . BLADDER SUSPENSION N/A 02/18/2015   Procedure: TRANSVAGINAL TAPE (TVT) PROCEDURE;  Surgeon: Everett Graff, MD;  Location: [facility];  Service: Gynecology;  Laterality: N/A;  . BREAST SURGERY    . CYSTOSCOPY Bilateral 02/18/2015   Procedure: CYSTOSCOPY;  Surgeon: Everett Graff, MD;  Location: [facility];  Service: Gynecology;  Laterality: Bilateral;  . LAPAROSCOPIC ASSISTED VAGINAL HYSTERECTOMY N/A 02/18/2015   Procedure: LAPAROSCOPIC ASSISTED VAGINAL  HYSTERECTOMY;  Surgeon: Everett Graff, MD;  Location: [facility];  Service: Gynecology;  Laterality: N/A;  . SALPINGOOPHORECTOMY Bilateral 02/18/2015   Procedure: BILATERAL SALPINGO OOPHORECTOMY;  Surgeon: Everett Graff, MD;  Location: [facility];  Service: Gynecology;  Laterality: Bilateral;   Social History   Occupational History  . Not on file  Tobacco Use  . Smoking status: Never Smoker  . Smokeless tobacco: Never Used  Substance and Sexual Activity  . Alcohol use: Yes    Comment: socially  . Drug use: No  . Sexual activity: Yes    Birth control/protection: Post-menopausal, Surgical

## 2019-11-14 ENCOUNTER — Ambulatory Visit: Payer: Medicare Other | Attending: Family Medicine

## 2019-11-14 ENCOUNTER — Other Ambulatory Visit: Payer: Self-pay

## 2019-11-14 DIAGNOSIS — M5441 Lumbago with sciatica, right side: Secondary | ICD-10-CM | POA: Insufficient documentation

## 2019-11-14 DIAGNOSIS — M5440 Lumbago with sciatica, unspecified side: Secondary | ICD-10-CM

## 2019-11-15 NOTE — Patient Instructions (Signed)
Complete prone ext 10 reps several times a day. If R LE pain centralizes and low back pain decreases continue the exs. If LE pain extends further down R LE then DC the prone ext.

## 2019-11-15 NOTE — Therapy (Signed)
[place], [place], [address] Phone: [phone]   Fax:  [phone]  Physical Therapy Evaluation  Patient Details  Name: Tiffany Berg MRN: [id number] Date of Birth: [birth year] Referring Provider (PT): Micheal Hilts   Encounter Date: 11/14/2019  PT End of Session - 11/15/19 1459    Visit Number  1    Number of Visits  13    Date for PT Re-Evaluation  12/26/19    Authorization Type  [employer] Medicare    PT Start Time  0915    PT Stop Time  1015    PT Time Calculation (min)  60 min    Activity Tolerance  Patient tolerated treatment well    Behavior During Therapy  [facility] for tasks assessed/performed       Past Medical History:  Diagnosis Date  . Cancer ([place])    left breast  . Depression    undiagnosed- pt feels like crying sometimes for no reason  . Headache   . Hypothyroidism     Past Surgical History:  Procedure Laterality Date  . ANTERIOR AND POSTERIOR REPAIR N/A 02/18/2015   Procedure: ANTERIOR (CYSTOCELE) AND POSTERIOR REPAIR (RECTOCELE);  Surgeon: Everett Graff, MD;  Location: [facility];  Service: Gynecology;  Laterality: N/A;  . BLADDER SUSPENSION N/A 02/18/2015   Procedure: TRANSVAGINAL TAPE (TVT) PROCEDURE;  Surgeon: Everett Graff, MD;  Location: [facility];  Service: Gynecology;  Laterality: N/A;  . BREAST SURGERY    . CYSTOSCOPY Bilateral 02/18/2015   Procedure: CYSTOSCOPY;  Surgeon: Everett Graff, MD;  Location: [facility];  Service: Gynecology;  Laterality: Bilateral;  . LAPAROSCOPIC ASSISTED VAGINAL HYSTERECTOMY N/A 02/18/2015   Procedure: LAPAROSCOPIC ASSISTED VAGINAL HYSTERECTOMY;  Surgeon: Everett Graff, MD;  Location: [facility];  Service: Gynecology;  Laterality: N/A;  . SALPINGOOPHORECTOMY Bilateral 02/18/2015   Procedure: BILATERAL SALPINGO OOPHORECTOMY;  Surgeon: Everett Graff, MD;  Location: [facility];  Service: Gynecology;  Laterality: Bilateral;    There were no vitals filed for this  visit.   Subjective Assessment - 11/14/19 0936    Subjective  Pt reports low back and R LE pain. 6/10 during the day, but wrose at night affecting her sleep.    Patient is accompained by:  Family member    Limitations  Sitting;Lifting;Standing;House hold activities    How long can you sit comfortably?  1.5  hr    How long can you stand comfortably?  30 mins    How long can you walk comfortably?  1-2 hours    Diagnostic tests  MRI: Alignment:  Grade 1 anterolisthesis at L4-5, facet mediated Vertebrae:  No fracture, evidence of discitis, or bone lesion. Conus medullaris and cauda equina: Conus extends to the L1 level.Conus and cauda equina appear normal. Paraspinal and other soft tissues: Some left-sided colonicdiverticular noted. Disc levels: T12- L1: Unremarkable. L1-L2: Small superiorly migrating right paracentral extrusionwithout neural compression. L2-L3: Minor annulus bulging. L3-L4: Minor annulus bulging L4-L5: Prominent facet degeneration with spurring andanterolisthesis. The disc is narrowed and bulging with a centralprotrusion. Asymmetric right subarticular recess narrowing thatcould affect the right L5 nerve root. The foramina are patent L5-S1:Mild disc bulging.  No impingement. IMPRESSION:1. L4-5 advanced facet arthropathy with anterolisthesis and discprotrusion causing asymmetric right subarticular recess narrowingthat could affect the right L5 nerve root.2. L1-2 small superiorly migrating disc extrusion without neuralcontact.Xray 10/01/19: IMPRESSION:Minimal grade 1 anterolisthesis of L4 on L5 with minimalretrolisthesis of L5 on S1. Associated facet sclerosis.    Patient Stated Goals  For my  back and leg pain to improve    Currently in Pain?  Yes    Pain Score  6     Pain Location  Leg    Pain Orientation  Right    Pain Descriptors / Indicators  Constant;Aching    Pain Type  Acute pain;Chronic pain    Pain Radiating Towards  Anterior, lateral thigh and knee    Pain Onset  Other  (comment)   3 months ago   Pain Frequency  Constant    Aggravating Factors   Work, interupts sleep    Effect of Pain on Daily Activities  Trying  to manage work and pain. Interupts sleep    Multiple Pain Sites  No         [facility] PT Assessment - 11/15/19 0001      Assessment   Medical Diagnosis  Acute right-sided low back pain with right-sided sciatica    Referring Provider (PT)  Micheal Hilts    Onset Date/Surgical Date  08/13/20      Precautions   Precautions  None      Restrictions   Weight Bearing Restrictions  No      Balance Screen   Has the patient fallen in the past 6 months  No    Has the patient had a decrease in activity level because of a fear of falling?   Yes    Is the patient reluctant to leave their home because of a fear of falling?   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      Cognition   Overall Cognitive Status  Within Functional Limits for tasks assessed      Sensation   Light Touch  Appears Intact      Coordination   Gross Motor Movements are Fluid and Coordinated  Yes      Posture/Postural Control   Posture/Postural Control  Postural limitations    Postural Limitations  Forward head      ROM / Strength   AROM / PROM / Strength  AROM;Strength      AROM   AROM Assessment Site  Lumbar;Thoracic    Lumbar Flexion  Full with pt able to touch floor    Lumbar Extension  min decrease ROM    Lumbar - Right Side Bend  38 cm from floor    Lumbar - Left Side Bend  34 cm from floor    Lumbar - Right Rotation   NT    Lumbar - Left Rotation  NT      Strength   Overall Strength  Within functional limits for tasks performed    Overall Strength Comments  LS myotome screen Neg for weakness      Special Tests    Special Tests  --    Other special tests  Directional LS tedting for provocation/reduction of symptoms revealed no change in LB and R LE pain with 10 reps  of B KTC, while prone ext centralized the pt'ss R LE pain and reduced the low back pain.    Lumbar Tests  Slump Test;Straight Leg Raise      Slump test   Findings  Negative      Straight Leg Raise   Findings  Negative    Comment  right and left                Objective measurements completed on examination: See above findings.              PT Education - 11/15/19 1453    Education Details  HEP; Positive and negative signs of low back and LE re: improvement vs wrosening related to pain levels and cenralization vs peripheralization of pain.    Person(s) Educated  Patient;Child(ren)    Methods  Explanation;Demonstration;Tactile cues;Verbal cues;Handout    Comprehension  Verbalized understanding;Returned demonstration;Verbal cues required;Tactile cues required;Need further instruction       PT Short Term Goals - 11/15/19 1510      PT SHORT TERM GOAL #1   Title  Pt will be ind in an initial HEP    Baseline  no program    Time  3    Period  Weeks    Status  New    Target Date  12/06/19      PT SHORT TERM GOAL #2   Title  Pt will demostrate proper sitting mechanics and body mechanics for work/home related activities to assist in the reduction of symptoms with these activities.    Baseline  limited understanding    Time  3    Period  Weeks    Status  New    Target Date  12/06/19        PT Long Term Goals - 11/15/19 1513      PT LONG TERM GOAL #1   Title  Pt will be Ind in a final HEP to address pain and functional deficits    Baseline  no program    Time  6    Period  Weeks    Status  New    Target  Date  12/27/19      PT LONG TERM GOAL #2   Title  Establish a directional preference for movement to assist pt in the management and resolution of LB and R LE pain.    Baseline  Evolving    Time  6    Period  Weeks    Status  New    Target Date  12/27/19      PT LONG TERM GOAL #3   Title  Pt will rate her LB and R LE pain in a range of 0-3  with daily and work activities.    Baseline  4-9/10    Time  6    Period  Weeks    Status  New    Target Date  12/27/19      PT LONG TERM GOAL #4   Title  Pt's FOTO limitation score will improve to the predicted level.    Baseline  Test to be taken  next visit    Time  6    Period  Weeks    Status  New    Target Date  12/27/19             Plan - 11/15/19 1502    Clinical Impression Statement  Pt presents c low back and R LE pain along the anterior/lateral aspect. Pian  has been present for 3+ months. PT eval with directional preference for reduction of symptoms revealed pt responded to prone extensiom c R LE pain resolved and low back pain decreased.    Personal Factors and Comorbidities  Comorbidity 1    Comorbidities  Depression    Examination-Activity Limitations  Bend;Lift;Squat;Stand;Sleep;Other    Examination-Participation Restrictions  Cleaning;Community Activity;Yard Work    [employer]chant [employer] officer  Evolving/Moderate complexity    Clinical Decision Making  Moderate    Rehab Potential  Good    PT Frequency  2x / week    PT Duration  6 weeks    PT Treatment/Interventions  Cryotherapy;Ultrasound;Traction;Moist Heat;Iontophoresis 4mg /ml Dexamethasone;Electrical Stimulation;Gait training;Functional mobility training;Neuromuscular re-education;Balance training;Therapeutic exercise;Therapeutic activities;Manual techniques;Dry needling;Passive range of motion;Joint Manipulations    PT Next Visit Plan  Assess pt's response to HEP. Administer FOTO. Continue assessment for directional preference for centralization and reduction  of pain. Modalities and traction as indicated.    PT Home Exercise Plan  Prone ext    Consulted and Agree with Plan of Care  Patient       Patient will benefit from skilled therapeutic intervention in order to improve the following deficits and impairments:  Decreased activity tolerance, Decreased knowledge of precautions, Difficulty walking, Improper body mechanics, Pain  Visit Diagnosis: Acute left-sided low back pain with sciatica, sciatica laterality unspecified - Plan: PT plan of care cert/re-cert     Problem List Patient Active Problem List   Diagnosis Date Noted  . Bilateral low back pain 10/02/2019  . Pelvic prolapse 02/18/2015    Gar Ponto MS, PT 11/15/19 3:42 PM  [facility] [address] [place], [place], [address] Phone: [phone]   Fax:  [phone]  Name: Adilia Byrnes MRN: [id number] Date of Birth: [birth year]

## 2019-11-18 ENCOUNTER — Other Ambulatory Visit: Payer: Self-pay

## 2019-11-18 ENCOUNTER — Ambulatory Visit
Admission: RE | Admit: 2019-11-18 | Discharge: 2019-11-18 | Disposition: A | Discharge status: Home / Self Care | Payer: Medicare Other | Source: Ambulatory Visit | Attending: Family Medicine | Admitting: Family Medicine

## 2019-11-18 DIAGNOSIS — Z780 Asymptomatic menopausal state: Secondary | ICD-10-CM | POA: Diagnosis not present

## 2019-11-18 DIAGNOSIS — M8589 Other specified disorders of bone density and structure, multiple sites: Secondary | ICD-10-CM | POA: Diagnosis not present

## 2019-11-20 ENCOUNTER — Encounter: Payer: Self-pay | Admitting: Physical Therapy

## 2019-11-20 ENCOUNTER — Ambulatory Visit: Payer: Medicare Other | Admitting: Physical Therapy

## 2019-11-20 ENCOUNTER — Other Ambulatory Visit: Payer: Self-pay

## 2019-11-20 DIAGNOSIS — M5440 Lumbago with sciatica, unspecified side: Secondary | ICD-10-CM

## 2019-11-20 DIAGNOSIS — M5441 Lumbago with sciatica, right side: Secondary | ICD-10-CM | POA: Diagnosis not present
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
## 2019-11-20 NOTE — Patient Instructions (Signed)

## 2019-11-28 ENCOUNTER — Other Ambulatory Visit: Payer: Self-pay

## 2019-11-28 ENCOUNTER — Ambulatory Visit: Payer: Medicare Other

## 2019-11-28 DIAGNOSIS — M5441 Lumbago with sciatica, right side: Secondary | ICD-10-CM | POA: Diagnosis not present

## 2019-11-28 DIAGNOSIS — M5440 Lumbago with sciatica, unspecified side: Secondary | ICD-10-CM

## 2019-11-30 NOTE — Therapy (Signed)
[place], [place], [address] Phone: [phone]   Fax:  [phone]  Physical Therapy Treatment  Patient Details  Name: Tiffany Berg MRN: [id number] Date of Birth: 02-01-1956 Referring Provider (PT): Micheal Hilts   Encounter Date: 11/28/2019  PT End of Session - 11/30/19 2128    Visit Number  3    Number of Visits  13    Date for PT Re-Evaluation  12/26/19    Authorization Type  [employer] Medicare    PT Start Time  1010    PT Stop Time  1050    PT Time Calculation (min)  40 min    Activity Tolerance  Patient tolerated treatment well    Behavior During Therapy  [facility] for tasks assessed/performed       Past Medical History:  Diagnosis Date  . Cancer ([place])    left breast  . Depression    undiagnosed- pt feels like crying sometimes for no reason  . Headache   . Hypothyroidism     Past Surgical History:  Procedure Laterality Date  . ANTERIOR AND POSTERIOR REPAIR N/A 02/18/2015   Procedure: ANTERIOR (CYSTOCELE) AND POSTERIOR REPAIR (RECTOCELE);  Surgeon: Everett Graff, MD;  Location: [facility];  Service: Gynecology;  Laterality: N/A;  . BLADDER SUSPENSION N/A 02/18/2015   Procedure: TRANSVAGINAL TAPE (TVT) PROCEDURE;  Surgeon: Everett Graff, MD;  Location: [facility];  Service: Gynecology;  Laterality: N/A;  . BREAST SURGERY    . CYSTOSCOPY Bilateral 02/18/2015   Procedure: CYSTOSCOPY;  Surgeon: Everett Graff, MD;  Location: [facility];  Service: Gynecology;  Laterality: Bilateral;  . LAPAROSCOPIC ASSISTED VAGINAL HYSTERECTOMY N/A 02/18/2015   Procedure: LAPAROSCOPIC ASSISTED VAGINAL HYSTERECTOMY;  Surgeon: Everett Graff, MD;  Location: [facility];  Service: Gynecology;  Laterality: N/A;  . SALPINGOOPHORECTOMY Bilateral 02/18/2015   Procedure: BILATERAL SALPINGO OOPHORECTOMY;  Surgeon: Everett Graff, MD;  Location: [facility];  Service: Gynecology;  Laterality: Bilateral;    There were no vitals filed for this  visit.  Subjective Assessment - 11/30/19 2123    Subjective  Pt reports only having a little mid-back pain.    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    Person(s) Educated  Patient    Methods  Explanation;Demonstration;Tactile cues;Verbal cues;Handout    Comprehension  Verbalized understanding;Returned demonstration;Verbal cues required;Tactile cues required;Other (comment)       PT Short Term Goals - 11/15/19 1510      PT SHORT TERM GOAL #1   Title  Pt will be ind in an initial HEP    Baseline  no program    Time  3    Period  Weeks    Status  New    Target Date  12/06/19      PT SHORT TERM GOAL #2   Title  Pt will demostrate proper sitting mechanics and body mechanics for work/home related activities to assist in the reduction of symptoms with these activities.    Baseline  limited understanding    Time  3    Period  Weeks    Status  New    Target Date  12/06/19  PT Long Term Goals - 11/15/19 1513      PT LONG TERM GOAL #1   Title  Pt will be Ind in a final HEP to address pain and functional deficits    Baseline  no program    Time  6    Period  Weeks    Status  New    Target Date  12/27/19      PT LONG TERM GOAL #2   Title  Establish a directional preference for movement to assist pt in the management and resolution of LB and R LE pain.    Baseline  Evolving    Time  6    Period  Weeks    Status  New    Target Date  12/27/19      PT LONG TERM GOAL #3   Title  Pt will rate her LB and R LE pain in a range of 0-3  with daily and work activities.    Baseline  4-9/10    Time  6    Period  Weeks    Status  New    Target Date  12/27/19      PT LONG TERM GOAL #4   Title  Pt's FOTO limitation score will improve to the predicted level.    Baseline  Test to be taken   next visit    Time  6    Period  Weeks    Status  New    Target Date  12/27/19            Plan - 11/30/19 2131    Clinical Impression Statement  Pt is responding to current course of PT. Low back and L LE pain has resolved. 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 Body mechanic instruction and exs to address her mid back pain and fatigue wereprovided with pt demonstrating understanding of these recommndations.    PT Treatment/Interventions  Cryotherapy;Ultrasound;Traction;Moist Heat;Iontophoresis 4mg /ml Dexamethasone;Electrical Stimulation;Gait training;Functional mobility training;Neuromuscular re-education;Balance training;Therapeutic exercise;Therapeutic activities;Manual techniques;Dry needling;Passive range of motion;Joint Manipulations    PT Next Visit Plan  Assess pt's response to new HEP exs.    PT Home Exercise Plan  [id number]: scapular retractions intermittently, standing ext intermittently; prone ext, SKTK, and scapular retractions c green Tband 2x per day.       Patient will benefit from skilled therapeutic intervention in order to improve the following deficits and impairments:  Decreased activity tolerance, Decreased knowledge of precautions, Difficulty walking, Improper body mechanics, Pain  Visit Diagnosis: Acute left-sided low back pain with sciatica, sciatica laterality unspecified     Problem List Patient Active Problem List   Diagnosis Date Noted  . Bilateral low back pain 10/02/2019  . Pelvic prolapse 02/18/2015    Gar Ponto MS, PT 11/30/19 9:43 PM  [facility] [address] [place], [place], [address] Phone: [phone]   Fax:  [phone]  Name: Tiffany Berg MRN: [id number] Date of Birth: [birth year]

## 2019-12-01 ENCOUNTER — Other Ambulatory Visit: Payer: Self-pay

## 2019-12-01 ENCOUNTER — Ambulatory Visit: Payer: Medicare Other

## 2019-12-01 DIAGNOSIS — M5440 Lumbago with sciatica, unspecified side: Secondary | ICD-10-CM

## 2019-12-02 ENCOUNTER — Encounter: Payer: Self-pay | Admitting: Family Medicine

## 2019-12-02 ENCOUNTER — Ambulatory Visit (INDEPENDENT_AMBULATORY_CARE_PROVIDER_SITE_OTHER): Payer: Medicare Other | Admitting: Family Medicine

## 2019-12-02 VITALS — BP 110/80 | HR 62 | Resp 12 | Ht 62.0 in | Wt 155.2 lb

## 2019-12-02 DIAGNOSIS — R200 Anesthesia of skin: Secondary | ICD-10-CM

## 2019-12-02 DIAGNOSIS — M549 Dorsalgia, unspecified: Secondary | ICD-10-CM

## 2019-12-02 DIAGNOSIS — M8580 Other specified disorders of bone density and structure, unspecified site: Secondary | ICD-10-CM | POA: Insufficient documentation

## 2019-12-02 DIAGNOSIS — R202 Paresthesia of skin: Secondary | ICD-10-CM

## 2019-12-02 DIAGNOSIS — M79651 Pain in right thigh: Secondary | ICD-10-CM

## 2019-12-02 DIAGNOSIS — G4700 Insomnia, unspecified: Secondary | ICD-10-CM | POA: Diagnosis not present

## 2019-12-02 MED ORDER — GABAPENTIN 100 MG PO CAPS: 200.0000 mg | ORAL_CAPSULE | Freq: Every day | ORAL | 3 refills | Status: DC

## 2019-12-02 NOTE — Patient Instructions (Signed)
Doorway stretch forward and diagonal each direction. Tennis ball massage against wall.

## 2019-12-02 NOTE — Therapy (Signed)
[place], [place], [address] Phone: [phone]   Fax:  [phone]  Physical Therapy Treatment  Patient Details  Name: Tiffany Berg MRN: [id number] Date of Birth: [birth year] Referring Provider (PT): Micheal Hilts   Encounter Date: 12/01/2019  PT End of Session - 12/02/19 [id number]    Visit Number  4    Number of Visits  13    Date for PT Re-Evaluation  12/26/19    Authorization Type  [employer] Medicare    PT Start Time  1546    PT Stop Time  1618    PT Time Calculation (min)  32 min    Activity Tolerance  Patient tolerated treatment well    Behavior During Therapy  [facility] for tasks assessed/performed       Past Medical History:  Diagnosis Date  . Cancer ([place])    left breast  . Depression    undiagnosed- pt feels like crying sometimes for no reason  . Headache   . Hypothyroidism     Past Surgical History:  Procedure Laterality Date  . ANTERIOR AND POSTERIOR REPAIR N/A 02/18/2015   Procedure: ANTERIOR (CYSTOCELE) AND POSTERIOR REPAIR (RECTOCELE);  Surgeon: Everett Graff, MD;  Location: [facility];  Service: Gynecology;  Laterality: N/A;  . BLADDER SUSPENSION N/A 02/18/2015   Procedure: TRANSVAGINAL TAPE (TVT) PROCEDURE;  Surgeon: Everett Graff, MD;  Location: [facility];  Service: Gynecology;  Laterality: N/A;  . BREAST SURGERY    . CYSTOSCOPY Bilateral 02/18/2015   Procedure: CYSTOSCOPY;  Surgeon: Everett Graff, MD;  Location: [facility];  Service: Gynecology;  Laterality: Bilateral;  . LAPAROSCOPIC ASSISTED VAGINAL HYSTERECTOMY N/A 02/18/2015   Procedure: LAPAROSCOPIC ASSISTED VAGINAL HYSTERECTOMY;  Surgeon: Everett Graff, MD;  Location: [facility];  Service: Gynecology;  Laterality: N/A;  . SALPINGOOPHORECTOMY Bilateral 02/18/2015   Procedure: BILATERAL SALPINGO OOPHORECTOMY;  Surgeon: Everett Graff, MD;  Location: [facility];  Service: Gynecology;  Laterality: Bilateral;    There were no vitals filed for this  visit.  Subjective Assessment - 12/02/19 0915    Subjective  Pt reports having only a little upper to mid back pain now. Rates a 1-2/10. Pt denies low back or LE pain. pt reports she is completing her HEP.                       [facility] Adult PT Treatment/Exercise - 12/02/19 0001      Posture/Postural Control   Posture Comments  eduactio re: proper posture and body mechanics associated c prolonged standing and forward reaching       Lumbar Exercises: Stretches   Other Lumbar Stretch Exercise  Doorway stretch for forward and diagonally 2x20 sec each      Lumbar Exercises: Standing   Scapular Retraction  Strengthening;15 reps;Theraband    Theraband Level (Scapular Retraction)  Level 3 (Green)    Scapular Retraction Limitations  2 sets      Manual Therapy   Manual Therapy  Soft tissue mobilization    Manual therapy comments  education for use of tennis ball             PT Education - 12/02/19 0918    Education Details  New HEP ex; massage with tennis ball against wall; review of proper posture and body mechanics for decrease back strain at work.    Person(s) Educated  Patient    Methods  Explanation;Demonstration;Tactile cues;Verbal cues;Handout    Comprehension  Verbalized understanding;Returned demonstration;Verbal cues required;Tactile cues  required;Need further instruction       PT Short Term Goals - 11/15/19 1510      PT SHORT TERM GOAL #1   Title  Pt will be ind in an initial HEP    Baseline  no program    Time  3    Period  Weeks    Status  New    Target Date  12/06/19      PT SHORT TERM GOAL #2   Title  Pt will demostrate proper sitting mechanics and body mechanics for work/home related activities to assist in the reduction of symptoms with these activities.    Baseline  limited understanding    Time  3    Period  Weeks    Status  New    Target Date  12/06/19        PT Long Term Goals - 11/15/19 1513      PT LONG TERM GOAL #1   Title   Pt will be Ind in a final HEP to address pain and functional deficits    Baseline  no program    Time  6    Period  Weeks    Status  New    Target Date  12/27/19      PT LONG TERM GOAL #2   Title  Establish a directional preference for movement to assist pt in the management and resolution of LB and R LE pain.    Baseline  Evolving    Time  6    Period  Weeks    Status  New    Target Date  12/27/19      PT LONG TERM GOAL #3   Title  Pt will rate her LB and R LE pain in a range of 0-3  with daily and work activities.    Baseline  4-9/10    Time  6    Period  Weeks    Status  New    Target Date  12/27/19      PT LONG TERM GOAL #4   Title  Pt's FOTO limitation score will improve to the predicted level.    Baseline  Test to be taken  next visit    Time  6    Period  Weeks    Status  New    Target Date  12/27/19            Plan - 12/02/19 [id number]    Clinical Impression Statement  Pt's low back and LE radicular pain has responded well to current PT treatment plan with these symptoms resolved. 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 A HEP for upper to midback strengthening and stretching, self massage c a tennis ball and application of proper posture and body mechanics has been initiated to address pain associated with recurrent stresses to these areas.    PT Treatment/Interventions  Cryotherapy;Ultrasound;Traction;Moist Heat;Iontophoresis 4mg /ml Dexamethasone;Electrical Stimulation;Gait training;Functional mobility training;Neuromuscular re-education;Balance training;Therapeutic exercise;Therapeutic activities;Manual techniques;Dry needling;Passive range of motion;Joint Manipulations    PT Next Visit Plan  Assess treatment plan for addressing upper to midback pain.    PT Home Exercise Plan  Doorway stretch forward and diagonal each direction. Tennis ball massage against wall.       Patient will  benefit from skilled therapeutic intervention in order to improve the following deficits and impairments:  Decreased activity tolerance, Decreased knowledge of precautions, Difficulty walking, Improper body mechanics, Pain  Visit Diagnosis: Acute left-sided low back pain with sciatica, sciatica laterality unspecified     Problem List Patient Active Problem List   Diagnosis Date Noted  . Osteopenia 12/02/2019  . Bilateral low back pain 10/02/2019  . Pelvic prolapse 02/18/2015    Gar Ponto MS, PT 12/02/19 9:46 AM  [facility] [address] [place], [place], [address] Phone: [phone]   Fax:  [phone]  Name: Tiffany Berg MRN: [id number] Date of Birth: 1956-05-09

## 2019-12-02 NOTE — Progress Notes (Signed)
HPI:   [name] is a 64 y.o. female, who is here today for follow up.   She was last seen on 10/01/19.  Insomnia: She is sleeping about 7 hours and feels rested. She is taking Trazodone 50 mg daily. On her med list is also Ambien 5 mg, which she is not sure if she is taking daily.  Right-sided back pain radiated to right hip and right thigh pain.  Since her last visit she has seen ortho and started on new medications: Celebrex 200 mg bid and Zanaflex 2 mg. She is taking Celebrex 200 mg once daily. She started PT.  RLE" "bone pain pain" that was interfering with sleep.She started Gabapentin, now she is taking 200 mg daily at night. She has not had leg pain since Gabapentin was increased from 100 mg to 200 mg. Numbness in RLE has also improved. Negative for saddle anesthesia,bowel/bladder dysfunction. Denies fever,chills,skin rash,cold extremity, or LE focal weakness.  She is also having mild right upper back pain, no radiated. Exacerbated by certain activities,head movement. Alleviated with local massage. No hx of trauma. Negative for UE numbness or tingling.  Tolerating medications well.  Review of Systems  Constitutional: Negative for activity change, appetite change, fatigue and fever.  Respiratory: Negative for cough, shortness of breath and wheezing.   Cardiovascular: Negative for chest pain, palpitations and leg swelling.  Gastrointestinal: Negative for abdominal pain, nausea and vomiting.       Negative for changes in bowel habits.  Genitourinary: Negative for decreased urine volume and hematuria.  Musculoskeletal: Negative for gait problem.  Neurological: Negative for syncope and headaches.  Psychiatric/Behavioral: Negative for confusion. The patient is nervous/anxious.   Rest of ROS, see pertinent positives sand negatives in HPI  Current Outpatient Medications on File Prior to Visit  Medication Sig Dispense Refill  . aspirin EC 81 MG tablet Take  81 mg by mouth daily.    . celecoxib (CELEBREX) 200 MG capsule Take 1 capsule (200 mg total) by mouth 2 (two) times daily as needed. 60 capsule 6  . docusate sodium (COLACE) 100 MG capsule Take 100 mg by mouth 3 (three) times daily.     . ferrous sulfate 325 (65 FE) MG tablet Take 325 mg by mouth daily with breakfast.    . levothyroxine (SYNTHROID) 75 MCG tablet Take 1 tablet (75 mcg total) by mouth daily before breakfast. 90 tablet 3  . lovastatin (MEVACOR) 40 MG tablet Take 1 tablet (40 mg total) by mouth at bedtime. 90 tablet 1  . tiZANidine (ZANAFLEX) 2 MG tablet Take 1-2 tablets (2-4 mg total) by mouth every 6 (six) hours as needed for muscle spasms. 60 tablet 1  . traZODone (DESYREL) 50 MG tablet Take 0.5-1 tablets (25-50 mg total) by mouth at bedtime as needed for sleep. 30 tablet 3  . zolpidem (AMBIEN) 5 MG tablet Take 5 mg by mouth at bedtime as needed.    . tamoxifen (NOLVADEX) 20 MG tablet Take 20 mg by mouth daily.     No current facility-administered medications on file prior to visit.     Past Medical History:  Diagnosis Date  . Cancer ([place])    left breast  . Depression    undiagnosed- pt feels like crying sometimes for no reason  . Headache   . Hypothyroidism    No Known Allergies  Social History   Socioeconomic History  . Marital status: Legally Separated    Spouse name: Not on file  .  Number of children: Not on file  . Years of education: Not on file  . Highest education level: Not on file  Occupational History  . Not on file  Tobacco Use  . Smoking status: Never Smoker  . Smokeless tobacco: Never Used  Substance and Sexual Activity  . Alcohol use: Yes    Comment: socially  . Drug use: No  . Sexual activity: Yes    Birth control/protection: Post-menopausal, Surgical  Other Topics Concern  . Not on file  Social History Narrative  . Not on file   Social Determinants of Health   Financial Resource Strain:   . Difficulty of Paying Living Expenses:    Food Insecurity:   . Worried About Charity fundraiser in the Last Year:   . Arboriculturist in the Last Year:   Transportation Needs:   . Film/video editor (Medical):   Marland Kitchen Lack of Transportation (Non-Medical):   Physical Activity:   . Days of Exercise per Week:   . Minutes of Exercise per Session:   Stress:   . Feeling of Stress :   Social Connections:   . Frequency of Communication with Friends and Family:   . Frequency of Social Gatherings with Friends and Family:   . Attends Religious Services:   . Active Member of Clubs or Organizations:   . Attends Archivist Meetings:   Marland Kitchen Marital Status:     Vitals:   12/02/19 1020  BP: 110/80  Pulse: 62  Resp: 12  SpO2: 94%   Body mass index is 28.39 kg/m.  Physical Exam  Nursing note and vitals reviewed. Constitutional: She is oriented to person, place, and time. She appears well-developed. No distress.  HENT:  Head: Normocephalic and atraumatic.  Eyes: Conjunctivae are normal.  Cardiovascular: Normal rate and regular rhythm.  No murmur heard. Pulses:      Dorsalis pedis pulses are 2+ on the right side and 2+ on the left side.  Respiratory: Effort normal and breath sounds normal. No respiratory distress.  GI: Soft. She exhibits no mass. There is no hepatomegaly. There is no abdominal tenderness.  Musculoskeletal:        General: No edema.     Cervical back: Tenderness (with palpation and with left-sided rotation/flexion) present. No bony tenderness. Normal range of motion.       Back:  Lymphadenopathy:    She has no cervical adenopathy.  Neurological: She is alert and oriented to person, place, and time. She has normal strength. Gait normal.  Skin: Skin is warm. No rash noted. No erythema.  Psychiatric: She has a normal mood and affect.  Well groomed, good eye contact.   ASSESSMENT AND PLAN:   [name] was seen today for follow-up.  Diagnoses and all orders for this visit:  Insomnia,  unspecified type Improved. Some side effects of Ambien discussed,recommend continuing with Trazodone. Good sleep hygiene.  Right thigh pain Radicular. Resolved with Gabapentin. Continue PT. No changes in current management.  -     gabapentin (NEURONTIN) 100 MG capsule; Take 2 capsules (200 mg total) by mouth at bedtime.  Numbness and tingling of right lower extremity Improved. Continue Gabapentin 200 mg daily at bedtime.  Upper back pain on right side Side effects of NSAID's and muscle relaxants discussed. Continue Celebrex 200 mg daily prn. Local massage. Continue PT.    Return in about 6 months (around 06/03/2020) for cpe.    Betty G. Martinique, MD  [facility]. [place] office.   A few things to remember from today's visit:   Continue Gabapentin 2 caps a la hora de la cama. Trazodone antes de la hora de la cama.  Cuidado con Ambien, es tambien para dormir.  Continue medicamentos ordenados por el ortopedista.  Please be sure medication list is accurate. If a new problem present, please set up appointment sooner than planned today.

## 2019-12-02 NOTE — Patient Instructions (Addendum)
A few things to remember from today's visit:   Continue Gabapentin 2 caps a la hora de la cama. Trazodone antes de la hora de la cama.  Cuidado con Ambien, es tambien para dormir.  Continue medicamentos ordenados por el ortopedista.  Please be sure medication list is accurate. If a new problem present, please set up appointment sooner than planned today.

## 2019-12-03 ENCOUNTER — Ambulatory Visit: Payer: Medicare Other | Admitting: Physical Therapy

## 2019-12-05 ENCOUNTER — Encounter: Payer: Self-pay | Admitting: Family Medicine

## 2019-12-05 MED ORDER — GABAPENTIN 100 MG PO CAPS: 200.0000 mg | ORAL_CAPSULE | Freq: Every day | ORAL | 3 refills | Status: DC

## 2019-12-08 ENCOUNTER — Other Ambulatory Visit: Payer: Self-pay

## 2019-12-08 ENCOUNTER — Encounter: Payer: Self-pay | Admitting: Physical Therapy

## 2019-12-08 ENCOUNTER — Ambulatory Visit: Payer: Medicare Other | Attending: Family Medicine | Admitting: Physical Therapy

## 2019-12-08 DIAGNOSIS — M5440 Lumbago with sciatica, unspecified side: Secondary | ICD-10-CM

## 2019-12-08 DIAGNOSIS — M5441 Lumbago with sciatica, right side: Secondary | ICD-10-CM | POA: Diagnosis not present

## 2019-12-08 NOTE — Therapy (Signed)
[place], [place], [address] Phone: [phone]   Fax:  [phone]  Physical Therapy Treatment  Patient Details  Name: Tiffany Berg MRN: [id number] Date of Birth: 02-Mar-1956 Referring Provider (PT): Micheal Hilts   Encounter Date: 12/08/2019  PT End of Session - 12/08/19 1555    Visit Number  5    Number of Visits  13    Date for PT Re-Evaluation  12/26/19    Authorization Type  UHC Medicare    PT Start Time  1509   arrived late   PT Stop Time  1558    PT Time Calculation (min)  49 min    Activity Tolerance  Patient tolerated treatment well    Behavior During Therapy  [facility] for tasks assessed/performed       Past Medical History:  Diagnosis Date  . Cancer ([place])    left breast  . Depression    undiagnosed- pt feels like crying sometimes for no reason  . Headache   . Hypothyroidism     Past Surgical History:  Procedure Laterality Date  . ANTERIOR AND POSTERIOR REPAIR N/A 02/18/2015   Procedure: ANTERIOR (CYSTOCELE) AND POSTERIOR REPAIR (RECTOCELE);  Surgeon: Everett Graff, MD;  Location: [facility];  Service: Gynecology;  Laterality: N/A;  . BLADDER SUSPENSION N/A 02/18/2015   Procedure: TRANSVAGINAL TAPE (TVT) PROCEDURE;  Surgeon: Everett Graff, MD;  Location: [facility];  Service: Gynecology;  Laterality: N/A;  . BREAST SURGERY    . CYSTOSCOPY Bilateral 02/18/2015   Procedure: CYSTOSCOPY;  Surgeon: Everett Graff, MD;  Location: [facility];  Service: Gynecology;  Laterality: Bilateral;  . LAPAROSCOPIC ASSISTED VAGINAL HYSTERECTOMY N/A 02/18/2015   Procedure: LAPAROSCOPIC ASSISTED VAGINAL HYSTERECTOMY;  Surgeon: Everett Graff, MD;  Location: [facility];  Service: Gynecology;  Laterality: N/A;  . SALPINGOOPHORECTOMY Bilateral 02/18/2015   Procedure: BILATERAL SALPINGO OOPHORECTOMY;  Surgeon: Everett Graff, MD;  Location: [facility];  Service: Gynecology;  Laterality: Bilateral;    There were no vitals filed for  this visit.  Subjective Assessment - 12/08/19 1511    Subjective  "I feel a lot better than when I first started (therapy)". Primary symptoms still in thoracic region. Leg pain has resolved and no longer having significant lower lumbar pain.    Patient is accompained by:  Interpreter   Ozzy# [id number]   Limitations  Sitting;Lifting;Standing;House hold activities    Currently in Pain?  Yes    Pain Score  3     Pain Location  Back    Pain Orientation  Upper    Pain Descriptors / Indicators  Aching    Pain Type  Chronic pain    Pain Onset  More than a month ago    Pain Frequency  Intermittent    Aggravating Factors   prolonged standing at work    Pain Relieving Factors  exercises help    Effect of Pain on Daily Activities  limits positional tolerance for work duties                       [employer] Adult PT Treatment/Exercise - 12/08/19 0001      Lumbar Exercises: [employer]   Press Ups  20 reps    Press Ups Limitations  prone press up on elbows tp assist thoracic extension      Lumbar Exercises: Standing   Scapular Retraction  AROM;Strengthening;Both;20 reps    Theraband Level (Scapular Retraction)  Level 3 (Green)    Scapular  Retraction Limitations  2 sets of 10    Shoulder Extension  AROM;Self ROM;Both;20 reps    Theraband Level (Shoulder Extension)  Level 3 (Green)    Shoulder Extension Limitations  2 set of 10    Other Standing Lumbar Exercises  "W" scapular retraction with thoracic extension using 55 cm ball on back at wall x 15 reps    Other Standing Lumbar Exercises  lumbar extension in standing x 20 reps      Lumbar Exercises: Supine   Other Supine Lumbar Exercises  Theraband horizontal abduction and ER from hooklying with green band x 15 reps ea.      Lumbar Exercises: Sidelying   Other Sidelying Lumbar Exercises  "open book" thoracic rotation stretch x 10 ea. bilat.      Modalities   Modalities  Moist Heat      Moist Heat Therapy   Number Minutes Moist  Heat  10 Minutes    Moist Heat Location  Lumbar Spine      Manual Therapy   Manual Therapy  Joint mobilization;Soft tissue mobilization    Joint Mobilization  thoracic PAs grade I-III    Soft tissue mobilization  STM thoracic paraspinals             PT Education - 12/08/19 1554    Education Details  exercises    Person(s) Educated  Patient    Methods  Explanation;Demonstration;Verbal cues;Tactile cues    Comprehension  Verbalized understanding;Returned demonstration       PT Short Term Goals - 11/15/19 1510      PT SHORT TERM GOAL #1   Title  Pt will be ind in an initial HEP    Baseline  no program    Time  3    Period  Weeks    Status  New    Target Date  12/06/19      PT SHORT TERM GOAL #2   Title  Pt will demostrate proper sitting mechanics and body mechanics for work/home related activities to assist in the reduction of symptoms with these activities.    Baseline  limited understanding    Time  3    Period  Weeks    Status  New    Target Date  12/06/19        PT Long Term Goals - 11/15/19 1513      PT LONG TERM GOAL #1   Title  Pt will be Ind in a final HEP to address pain and functional deficits    Baseline  no program    Time  6    Period  Weeks    Status  New    Target Date  12/27/19      PT LONG TERM GOAL #2   Title  Establish a directional preference for movement to assist pt in the management and resolution of LB and R LE pain.    Baseline  Evolving    Time  6    Period  Weeks    Status  New    Target Date  12/27/19      PT LONG TERM GOAL #3   Title  Pt will rate her LB and R LE pain in a range of 0-3  with daily and work activities.    Baseline  4-9/10    Time  6    Period  Weeks    Status  New    Target Date  12/27/19      PT LONG  TERM GOAL #4   Title  Pt's FOTO limitation score will improve to the predicted level.    Baseline  Test to be taken  next visit    Time  6    Period  Weeks    Status  New    Target Date  12/27/19             Plan - 12/08/19 1555    Clinical Impression Statement  More focus thoracic region as with previous recent sessions given (thoracic) region as primary area of current symptoms with thoracolumbar ROM, postural strengthening and manual therapy to address local thoracic hypomobility and muscle tightness. Pt. showing improvement from baseline status with reduced pain and resolved RLE radicular symptoms but still with functional limitations for positional tolerance at work for which she would benefit from continued PT to address.    Personal Factors and Comorbidities  Comorbidity 1    Comorbidities  Depression    Examination-Activity Limitations  Bend;Lift;Squat;Stand;Sleep;Other    Examination-Participation Restrictions  Cleaning;Community Activity;Yard Work    Merchant navy officer  Evolving/Moderate complexity    Clinical Decision Making  Moderate    Rehab Potential  Good    PT Frequency  2x / week    PT Duration  6 weeks    PT Treatment/Interventions  Cryotherapy;Ultrasound;Traction;Moist Heat;Iontophoresis 4mg /ml Dexamethasone;Electrical Stimulation;Gait training;Functional mobility training;Neuromuscular re-education;Balance training;Therapeutic exercise;Therapeutic activities;Manual techniques;Dry needling;Passive range of motion;Joint Manipulations    PT Next Visit Plan  continue thoracic focus with postural strengthening, manual, work on Technical sales engineer as needed for work positions    PT [place]  Doorway stretch forward and diagonal each direction. Tennis ball massage against wall.    Consulted and Agree with Plan of Care  Patient       Patient will benefit from skilled therapeutic intervention in order to improve the following deficits and impairments:  Decreased activity tolerance, Decreased knowledge of precautions, Difficulty walking, Improper body mechanics, Pain  Visit Diagnosis: Acute left-sided low back pain with sciatica, sciatica  laterality unspecified     Problem List Patient Active Problem List   Diagnosis Date Noted  . Osteopenia 12/02/2019  . Bilateral low back pain 10/02/2019  . Pelvic prolapse 02/18/2015    Beaulah Dinning, PT, DPT 12/08/19 3:59 PM  [facility] [address] [place], [place], [address] Phone: [phone]   Fax:  [phone]  Name: Tiffany Berg MRN: [id number] Date of Birth: 1956/08/07

## 2019-12-10 ENCOUNTER — Other Ambulatory Visit: Payer: Self-pay

## 2019-12-10 ENCOUNTER — Ambulatory Visit: Payer: Medicare Other

## 2019-12-10 DIAGNOSIS — M5441 Lumbago with sciatica, right side: Secondary | ICD-10-CM | POA: Diagnosis not present

## 2019-12-10 DIAGNOSIS — M5440 Lumbago with sciatica, unspecified side: Secondary | ICD-10-CM

## 2019-12-10 NOTE — Patient Instructions (Signed)
Standing Tband green: Scapular retraction and shoulder extension. Quadruped arm lifts c trunk rotation.

## 2019-12-10 NOTE — Therapy (Signed)
[place], [place], [address] Phone: [phone]   Fax:  [phone]  Physical Therapy Treatment  Patient Details  Name: Tiffany Berg MRN: [id number] Date of Birth: May 06, 1956 Referring Provider (PT): Micheal Hilts   Encounter Date: 12/10/2019  PT End of Session - 12/10/19 2237    Visit Number  6    Number of Visits  13    Date for PT Re-Evaluation  12/26/19    Authorization Type  UHC Medicare    PT Start Time  1538    PT Stop Time  1620    PT Time Calculation (min)  42 min    Activity Tolerance  Patient tolerated treatment well    Behavior During Therapy  [facility] for tasks assessed/performed       Past Medical History:  Diagnosis Date  . Cancer ([place])    left breast  . Depression    undiagnosed- pt feels like crying sometimes for no reason  . Headache   . Hypothyroidism     Past Surgical History:  Procedure Laterality Date  . ANTERIOR AND POSTERIOR REPAIR N/A 02/18/2015   Procedure: ANTERIOR (CYSTOCELE) AND POSTERIOR REPAIR (RECTOCELE);  Surgeon: Everett Graff, MD;  Location: [facility];  Service: Gynecology;  Laterality: N/A;  . BLADDER SUSPENSION N/A 02/18/2015   Procedure: TRANSVAGINAL TAPE (TVT) PROCEDURE;  Surgeon: Everett Graff, MD;  Location: [facility];  Service: Gynecology;  Laterality: N/A;  . BREAST SURGERY    . CYSTOSCOPY Bilateral 02/18/2015   Procedure: CYSTOSCOPY;  Surgeon: Everett Graff, MD;  Location: [facility];  Service: Gynecology;  Laterality: Bilateral;  . LAPAROSCOPIC ASSISTED VAGINAL HYSTERECTOMY N/A 02/18/2015   Procedure: LAPAROSCOPIC ASSISTED VAGINAL HYSTERECTOMY;  Surgeon: Everett Graff, MD;  Location: [facility];  Service: Gynecology;  Laterality: N/A;  . SALPINGOOPHORECTOMY Bilateral 02/18/2015   Procedure: BILATERAL SALPINGO OOPHORECTOMY;  Surgeon: Everett Graff, MD;  Location: [facility];  Service: Gynecology;  Laterality: Bilateral;    There were no vitals filed for this  visit.  Subjective Assessment - 12/10/19 2221    Subjective  Pt reports no low back pain. 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 Prior to covid hse changed positions completing a variety of tasks and she did not have pain like this c her upper to mid back. Pt states she does not have pian prior to work and it resolves later in the day after work. She reports the exs to addressher upper to mid back are helpful to decrease the strain while at work.    Currently in Pain?  No/denies    Pain Score  0-No pain    Pain Location  Back   for her original low back and R LE pain   Pain Type  Chronic pain    Multiple Pain Sites  Yes    Pain Score  4    Pain Location  Back   upper to mid back   Pain Orientation  Mid;Upper;Posterior    Pain Descriptors / Indicators  Aching    Pain Type  Acute pain    Pain Radiating Towards  NA    Pain Onset  1 to 4 weeks ago    Pain Frequency  Intermittent    Aggravating Factors   Work c static standing and repetitive reaching    Pain Relieving Factors  strengthening and stretching exs  [facility] Adult PT Treatment/Exercise - 12/10/19 0001      Lumbar Exercises: Stretches   Other Lumbar Stretch Exercise  Doorway stretch for forward and diagonally 2x20 sec each      Lumbar Exercises: Standing   Scapular Retraction  AROM;Strengthening;Both;20 reps    Theraband Level (Scapular Retraction)  Level 3 (Green)    Scapular Retraction Limitations  2 sets of 10    Shoulder Extension  AROM;Self ROM;Both;20 reps    Theraband Level (Shoulder Extension)  Level 3 (Green)    Shoulder Extension Limitations  2 sets of 10      Lumbar Exercises: Quadruped   Single Arm Raise  Right;Left;10 reps    Single Arm Raises Limitations  with trunk rotation             PT Education - 12/10/19 2235    Education Details  Added exs to HEP. Reviewed standing  postures and exs to reduce upper to midback pain while at work.    Person(s) Educated  Patient    Methods  Explanation;Tactile cues;Verbal cues;Handout;Demonstration    Comprehension  Verbalized understanding;Returned demonstration       PT Short Term Goals - 12/10/19 2252      PT SHORT TERM GOAL #1   Title  Pt will be ind in an initial HEP- Achieved    Baseline  no program    Target Date  12/06/19      PT SHORT TERM GOAL #2   Title  Pt will demostrate proper sitting mechanics and body mechanics for work/home related activities to assist in the reduction of symptoms with these activities - Achieved    Baseline  limited understanding        PT Long Term Goals - 11/15/19 1513      PT LONG TERM GOAL #1   Title  Pt will be Ind in a final HEP to address pain and functional deficits    Baseline  no program    Time  6    Period  Weeks    Status  New    Target Date  12/27/19      PT LONG TERM GOAL #2   Title  Establish a directional preference for movement to assist pt in the management and resolution of LB and R LE pain.    Baseline  Evolving    Time  6    Period  Weeks    Status  New    Target Date  12/27/19      PT LONG TERM GOAL #3   Title  Pt will rate her LB and R LE pain in a range of 0-3  with daily and work activities.    Baseline  4-9/10    Time  6    Period  Weeks    Status  New    Target Date  12/27/19      PT LONG TERM GOAL #4   Title  Pt's FOTO limitation score will improve to the predicted level.    Baseline  Test to be taken  next visit    Time  6    Period  Weeks    Status  New    Target Date  12/27/19            Plan - 12/10/19 2238    Clinical Impression Statement  Pt is continuing to have upper to midback pain associated c prolonged standing and repetive arm use. Measures introduced to interupt and and reduce these symptoms  have helped her to tolerated the work day better and to resolve this pain later in the day. Additional strengthening and  ROM exs were added to help the the pt tolerate these stresses better.    PT Treatment/Interventions  Cryotherapy;Ultrasound;Traction;Moist Heat;Iontophoresis 4mg /ml Dexamethasone;Electrical Stimulation;Gait training;Functional mobility training;Neuromuscular re-education;Balance training;Therapeutic exercise;Therapeutic activities;Manual techniques;Dry needling;Passive range of motion;Joint Manipulations    PT Next Visit Plan  Assess pt's response to added exs to address upper to midback flexibility and strength    PT Home Exercise Plan  [id number] :Standing Tband green: Scapular retraction and shoulder extension. Quadruped arm lifts c trunk rotation.       Patient will benefit from skilled therapeutic intervention in order to improve the following deficits and impairments:  Decreased activity tolerance, Decreased knowledge of precautions, Difficulty walking, Improper body mechanics, Pain  Visit Diagnosis: Acute left-sided low back pain with sciatica, sciatica laterality unspecified     Problem List Patient Active Problem List   Diagnosis Date Noted  . Osteopenia 12/02/2019  . Bilateral low back pain 10/02/2019  . Pelvic prolapse 02/18/2015    Gar Ponto MS, PT 12/10/19 11:01 PM  [place], [place], [address] Phone: [phone]   Fax:  [phone]  Name: Tiffany Berg MRN: [id number] Date of Birth: 06/24/1956

## 2019-12-15 ENCOUNTER — Ambulatory Visit: Payer: Medicare Other

## 2019-12-15 ENCOUNTER — Other Ambulatory Visit: Payer: Self-pay

## 2019-12-15 DIAGNOSIS — M5440 Lumbago with sciatica, unspecified side: Secondary | ICD-10-CM

## 2019-12-15 DIAGNOSIS — M5441 Lumbago with sciatica, right side: Secondary | ICD-10-CM | POA: Diagnosis not present

## 2019-12-16 NOTE — Therapy (Signed)
[place], [place], [address] Phone: [phone]   Fax:  [phone]  Physical Therapy Treatment  Patient Details  Name: Tiffany Berg MRN: [id number] Date of Birth: 12/23/1955 Referring Provider (PT): Micheal Hilts   Encounter Date: 12/15/2019  PT End of Session - 12/16/19 1010    Visit Number  7    Number of Visits  13    Date for PT Re-Evaluation  12/26/19    Authorization Type  [employer] Medicare    PT Start Time  1546    PT Stop Time  1613    PT Time Calculation (min)  27 min    Activity Tolerance  Patient tolerated treatment well    Behavior During Therapy  [facility] for tasks assessed/performed       Past Medical History:  Diagnosis Date  . Cancer ([place])    left breast  . Depression    undiagnosed- pt feels like crying sometimes for no reason  . Headache   . Hypothyroidism     Past Surgical History:  Procedure Laterality Date  . ANTERIOR AND POSTERIOR REPAIR N/A 02/18/2015   Procedure: ANTERIOR (CYSTOCELE) AND POSTERIOR REPAIR (RECTOCELE);  Surgeon: Everett Graff, MD;  Location: [facility];  Service: Gynecology;  Laterality: N/A;  . BLADDER SUSPENSION N/A 02/18/2015   Procedure: TRANSVAGINAL TAPE (TVT) PROCEDURE;  Surgeon: Everett Graff, MD;  Location: [facility];  Service: Gynecology;  Laterality: N/A;  . BREAST SURGERY    . CYSTOSCOPY Bilateral 02/18/2015   Procedure: CYSTOSCOPY;  Surgeon: Everett Graff, MD;  Location: [facility];  Service: Gynecology;  Laterality: Bilateral;  . LAPAROSCOPIC ASSISTED VAGINAL HYSTERECTOMY N/A 02/18/2015   Procedure: LAPAROSCOPIC ASSISTED VAGINAL HYSTERECTOMY;  Surgeon: Everett Graff, MD;  Location: [facility];  Service: Gynecology;  Laterality: N/A;  . SALPINGOOPHORECTOMY Bilateral 02/18/2015   Procedure: BILATERAL SALPINGO OOPHORECTOMY;  Surgeon: Everett Graff, MD;  Location: [facility];  Service: Gynecology;  Laterality: Bilateral;    There were no vitals filed for this  visit.  Subjective Assessment - 12/16/19 1007    Subjective  Pt reports she is not having low back or upper/mid back. She states she has not had an issue at work with pain since the last PT session. Pt states she has been consistent with her HEP.                       [place] [facility] Adult PT Treatment/Exercise - 12/16/19 0001      Lumbar Exercises: Stretches   Other Lumbar Stretch Exercise  Doorway stretch for forward and diagonally 2x20 sec each with deep breaths      Lumbar Exercises: Standing   Scapular Retraction  AROM;Strengthening;Both    Theraband Level (Scapular Retraction)  Level 3 (Green)    Scapular Retraction Limitations  2 sets of 15    Shoulder Extension  AROM;Self ROM;Both    Theraband Level (Shoulder Extension)  Level 3 (Green)    Shoulder Extension Limitations  2 sets of 15    Other Standing Lumbar Exercises  "W" scapular retraction with thoracic extension using 55 cm ball on back at wall x 15 reps      Lumbar Exercises: Quadruped   Single Arm Raise  Right;Left;10 reps    Single Arm Raises Limitations  with trunk rotation c inward/outward reaches             PT Education - 12/16/19 1010    Education Details  Review of proper body mechanics  Person(s) Educated  Patient    Methods  Explanation    Comprehension  Verbalized understanding       PT Short Term Goals - 12/10/19 2252      PT SHORT TERM GOAL #1   Title  Pt will be ind in an initial HEP- Achieved    Baseline  no program    Target Date  12/06/19      PT SHORT TERM GOAL #2   Title  Pt will demostrate proper sitting mechanics and body mechanics for work/home related activities to assist in the reduction of symptoms with these activities - Achieved    Baseline  limited understanding        PT Long Term Goals - 11/15/19 1513      PT LONG TERM GOAL #1   Title  Pt will be Ind in a final HEP to address pain and functional deficits    Baseline  no program    Time  6    Period  Weeks     Status  New    Target Date  12/27/19      PT LONG TERM GOAL #2   Title  Establish a directional preference for movement to assist pt in the management and resolution of LB and R LE pain.    Baseline  Evolving    Time  6    Period  Weeks    Status  New    Target Date  12/27/19      PT LONG TERM GOAL #3   Title  Pt will rate her LB and R LE pain in a range of 0-3  with daily and work activities.    Baseline  4-9/10    Time  6    Period  Weeks    Status  New    Target Date  12/27/19      PT LONG TERM GOAL #4   Title  Pt's FOTO limitation score will improve to the predicted level.    Baseline  Test to be taken  next visit    Time  6    Period  Weeks    Status  New    Target Date  12/27/19            Plan - 12/16/19 1011    Clinical Impression Statement  eve with work, pt is now reporting no upper/mid/low back pain for the past several days. Pt was encouraged to continue HEP and follow proper body mechanics and to anticipate days at work when she will experience pain.    PT Treatment/Interventions  Cryotherapy;Ultrasound;Traction;Moist Heat;Iontophoresis 4mg /ml Dexamethasone;Electrical Stimulation;Gait training;Functional mobility training;Neuromuscular re-education;Balance training;Therapeutic exercise;Therapeutic activities;Manual techniques;Dry needling;Passive range of motion;Joint Manipulations    PT Next Visit Plan  Re-assess c possible DC from PT services as per pt's status       Patient will benefit from skilled therapeutic intervention in order to improve the following deficits and impairments:  Decreased activity tolerance, Decreased knowledge of precautions, Difficulty walking, Improper body mechanics, Pain  Visit Diagnosis: Acute left-sided low back pain with sciatica, sciatica laterality unspecified     Problem List Patient Active Problem List   Diagnosis Date Noted  . Osteopenia 12/02/2019  . Bilateral low back pain 10/02/2019  . Pelvic prolapse  02/18/2015    Gar Ponto MS, PT 12/16/19 10:17 AM  [facility] [address] [place], [place], [address] Phone: [phone]   Fax:  [phone]  Name: Janalynn Bacheller MRN: [id number]  Date of Birth: 05/05/1956

## 2019-12-17 ENCOUNTER — Ambulatory Visit: Payer: Medicare Other

## 2019-12-25 ENCOUNTER — Ambulatory Visit: Payer: Medicare Other

## 2019-12-25 ENCOUNTER — Other Ambulatory Visit: Payer: Self-pay

## 2019-12-25 DIAGNOSIS — M5441 Lumbago with sciatica, right side: Secondary | ICD-10-CM | POA: Diagnosis not present

## 2019-12-25 DIAGNOSIS — M5440 Lumbago with sciatica, unspecified side: Secondary | ICD-10-CM

## 2019-12-26 NOTE — Therapy (Addendum)
[place], [place], [address] Phone: [phone]   Fax:  [phone]  Physical Therapy Treatment/Discharge  Patient Details  Name: Tiffany Berg MRN: [id number] Date of Birth: [birth year] Referring Provider (PT): Micheal Hilts   Encounter Date: 12/25/2019  PT End of Session - 12/26/19 2231    Visit Number  8    Date for PT Re-Evaluation  12/26/19    Authorization Type  [employer]HC Medicare    PT Start Time  1132    PT Stop Time  1213    PT Time Calculation (min)  41 min    Activity Tolerance  Patient tolerated treatment well    Behavior During Therapy  [facility] for tasks assessed/performed       Past Medical History:  Diagnosis Date  . Cancer ([place])    left breast  . Depression    undiagnosed- pt feels like crying sometimes for no reason  . Headache   . Hypothyroidism     Past Surgical History:  Procedure Laterality Date  . ANTERIOR AND POSTERIOR REPAIR N/A 02/18/2015   Procedure: ANTERIOR (CYSTOCELE) AND POSTERIOR REPAIR (RECTOCELE);  Surgeon: Everett Graff, MD;  Location: [facility];  Service: Gynecology;  Laterality: N/A;  . BLADDER SUSPENSION N/A 02/18/2015   Procedure: TRANSVAGINAL TAPE (TVT) PROCEDURE;  Surgeon: Everett Graff, MD;  Location: [facility];  Service: Gynecology;  Laterality: N/A;  . BREAST SURGERY    . CYSTOSCOPY Bilateral 02/18/2015   Procedure: CYSTOSCOPY;  Surgeon: Everett Graff, MD;  Location: [facility];  Service: Gynecology;  Laterality: Bilateral;  . LAPAROSCOPIC ASSISTED VAGINAL HYSTERECTOMY N/A 02/18/2015   Procedure: LAPAROSCOPIC ASSISTED VAGINAL HYSTERECTOMY;  Surgeon: Everett Graff, MD;  Location: [facility];  Service: Gynecology;  Laterality: N/A;  . SALPINGOOPHORECTOMY Bilateral 02/18/2015   Procedure: BILATERAL SALPINGO OOPHORECTOMY;  Surgeon: Everett Graff, MD;  Location: [facility];  Service: Gynecology;  Laterality: Bilateral;    There were no vitals filed for this visit.  Subjective  Assessment - 12/26/19 2223    Subjective  Pt reports she has continued to do much better she is having no low back pain and some mid back back pain occasionally at work which she manages with strengthening, postural and stretching exs.    Currently in Pain?  No/denies    Pain Score  0-No pain    Pain Location  Back    Pain Orientation  Upper;Lower    Pain Descriptors / Indicators  Aching    Pain Radiating Towards  NA    Pain Onset  More than a month ago    Pain Frequency  Intermittent    Aggravating Factors   prolonged standing at work    Pain Relieving Factors  Exercises    Effect of Pain on Daily Activities  Littleimpact with pt managing symptoms if they arise.    Pain Score  0    Pain Location  Back    Pain Orientation  Upper;Mid    Pain Descriptors / Indicators  Aching    Pain Onset  1 to 4 weeks ago    Aggravating Factors   Prolonged standing andd reaching    Pain Relieving Factors  Exercises    Effect of Pain on Daily Activities  Little impact with pt managing if pain develops                       [facility] Adult PT Treatment/Exercise - 12/26/19 0001      Lumbar Exercises: Stretches  Press Ups  10 reps    Press Ups Limitations  prone    Other Lumbar Stretch Exercise  Doorway stretch for forward and diagonally 1x20 sec each with deep breaths      Lumbar Exercises: Standing   Scapular Retraction  AROM;Strengthening;Both    Theraband Level (Scapular Retraction)  Level 3 (Green)    Scapular Retraction Limitations  15 reps    Shoulder Extension  AROM;Self ROM;Both    Theraband Level (Shoulder Extension)  Level 3 (Green)    Shoulder Extension Limitations  15 reps    Other Standing Lumbar Exercises  lumbar extension in standing x 10 reps      Lumbar Exercises: Quadruped   Single Arm Raise  Right;Left;10 reps    Single Arm Raises Limitations  with trunk rotation c inward/outward reaches             PT Education - 12/26/19 2230    Education Details   Reviewed HEP, methods to manage back pain, and proper posture and body mechanics    Person(s) Educated  Patient    Methods  Explanation;Demonstration    Comprehension  Verbalized understanding;Returned demonstration       PT Short Term Goals - 12/10/19 2252      PT SHORT TERM GOAL #1   Title  Pt will be ind in an initial HEP- Achieved    Baseline  no program    Target Date  12/06/19      PT SHORT TERM GOAL #2   Title  Pt will demostrate proper sitting mechanics and body mechanics for work/home related activities to assist in the reduction of symptoms with these activities - Achieved    Baseline  limited understanding        PT Long Term Goals - 12/26/19 2237      PT LONG TERM GOAL #1   Title  Pt will be Ind in a final HEP to address pain and functional deficits. Achieved    Baseline  no program    Target Date  12/25/19      PT LONG TERM GOAL #2   Title  Establish a directional preference for movement to assist pt in the management and resolution of LB and R LE pain. Achieved    Baseline  Evolving    Status  Achieved    Target Date  12/25/19      PT LONG TERM GOAL #3   Title  Pt will rate her LB and R LE pain in a range of 0-3  with daily and work activities. Achieved    Baseline  4-9/10    Target Date  12/25/19      PT LONG TERM GOAL #4   Title  Pt's FOTO limitation score will improve to the predicted level. Pt responded c a very good 1 time limitation score of 6% limited    Baseline  Not initiall taken    Status  Revised    Target Date  12/25/19            Plan - 12/26/19 2232    Clinical Impression Statement  Pt has responded well with marked reduction in her upper, mid, and low back pain. Pt is primarily pain free with her managing the pain if it arises during home or work related activities. Pt is Ind with a final HEP.    PT Treatment/Interventions  Cryotherapy;Ultrasound;Traction;Moist Heat;Iontophoresis 23m/ml Dexamethasone;Electrical Stimulation;Gait  training;Functional mobility training;Neuromuscular re-education;Balance training;Therapeutic exercise;Therapeutic activities;Manual techniques;Dry needling;Passive range of motion;Joint Manipulations  PT Next Visit Plan  NA    PT Home Exercise Plan  [id number]       Patient will benefit from skilled therapeutic intervention in order to improve the following deficits and impairments:  Decreased activity tolerance, Decreased knowledge of precautions, Difficulty walking, Improper body mechanics, Pain  Visit Diagnosis: Acute left-sided low back pain with sciatica, sciatica laterality unspecified     Problem List Patient Active Problem List   Diagnosis Date Noted  . Osteopenia 12/02/2019  . Bilateral low back pain 10/02/2019  . Pelvic prolapse 02/18/2015    Gar Ponto MS, PT 12/26/19 10:56 PM   PHYSICAL THERAPY DISCHARGE SUMMARY  Visits from Start of Care: 8  Current functional level related to goals / functional outcomes: See above   Remaining deficits: See above   Education / Equipment: HEP  Plan: Patient agrees to discharge.  Patient goals were met. Patient is being discharged due to meeting the stated rehab goals.  ?????       [place], [place], [address] Phone: [phone]   Fax:  [phone]  Name: Tiffany Berg MRN: [id number] Date of Birth: Aug 01, 1956

## 2020-03-05 DIAGNOSIS — D0512 Intraductal carcinoma in situ of left breast: Secondary | ICD-10-CM | POA: Diagnosis not present

## 2020-03-05 DIAGNOSIS — Z923 Personal history of irradiation: Secondary | ICD-10-CM | POA: Diagnosis not present

## 2020-03-05 DIAGNOSIS — Z9229 Personal history of other drug therapy: Secondary | ICD-10-CM | POA: Diagnosis not present

## 2020-03-05 DIAGNOSIS — D0510 Intraductal carcinoma in situ of unspecified breast: Secondary | ICD-10-CM | POA: Diagnosis not present

## 2020-03-05 DIAGNOSIS — Z1231 Encounter for screening mammogram for malignant neoplasm of breast: Secondary | ICD-10-CM | POA: Diagnosis not present

## 2020-03-05 DIAGNOSIS — R922 Inconclusive mammogram: Secondary | ICD-10-CM | POA: Diagnosis not present

## 2020-03-05 DIAGNOSIS — C50612 Malignant neoplasm of axillary tail of left female breast: Secondary | ICD-10-CM | POA: Diagnosis not present

## 2020-03-05 DIAGNOSIS — Z9889 Other specified postprocedural states: Secondary | ICD-10-CM | POA: Diagnosis not present

## 2020-03-12 ENCOUNTER — Encounter (HOSPITAL_COMMUNITY): Payer: Self-pay | Admitting: Physician Assistant

## 2020-03-12 ENCOUNTER — Ambulatory Visit (INDEPENDENT_AMBULATORY_CARE_PROVIDER_SITE_OTHER): Payer: Medicare Other

## 2020-03-12 ENCOUNTER — Encounter: Payer: Self-pay | Admitting: Podiatry

## 2020-03-12 ENCOUNTER — Inpatient Hospital Stay (HOSPITAL_COMMUNITY)
Admission: EM | Admit: 2020-03-12 | Discharge: 2020-03-15 | DRG: 244 | Disposition: A | Discharge status: Home / Self Care | Payer: Medicare Other | Attending: Cardiology | Admitting: Cardiology

## 2020-03-12 ENCOUNTER — Encounter: Payer: Self-pay | Admitting: Family Medicine

## 2020-03-12 ENCOUNTER — Ambulatory Visit (INDEPENDENT_AMBULATORY_CARE_PROVIDER_SITE_OTHER): Payer: Medicare Other | Admitting: Family Medicine

## 2020-03-12 ENCOUNTER — Emergency Department (HOSPITAL_COMMUNITY): Payer: Medicare Other

## 2020-03-12 ENCOUNTER — Ambulatory Visit (INDEPENDENT_AMBULATORY_CARE_PROVIDER_SITE_OTHER): Payer: Medicare Other | Admitting: Podiatry

## 2020-03-12 ENCOUNTER — Other Ambulatory Visit: Payer: Self-pay

## 2020-03-12 VITALS — BP 100/60 | HR 40 | Temp 97.4°F | Ht 62.0 in | Wt 154.0 lb

## 2020-03-12 DIAGNOSIS — E785 Hyperlipidemia, unspecified: Secondary | ICD-10-CM | POA: Diagnosis not present

## 2020-03-12 DIAGNOSIS — M216X1 Other acquired deformities of right foot: Secondary | ICD-10-CM

## 2020-03-12 DIAGNOSIS — M216X2 Other acquired deformities of left foot: Secondary | ICD-10-CM | POA: Diagnosis not present

## 2020-03-12 DIAGNOSIS — Z7982 Long term (current) use of aspirin: Secondary | ICD-10-CM

## 2020-03-12 DIAGNOSIS — M545 Low back pain, unspecified: Secondary | ICD-10-CM

## 2020-03-12 DIAGNOSIS — R300 Dysuria: Secondary | ICD-10-CM | POA: Diagnosis not present

## 2020-03-12 DIAGNOSIS — M2060 Acquired deformities of toe(s), unspecified, unspecified foot: Secondary | ICD-10-CM

## 2020-03-12 DIAGNOSIS — E039 Hypothyroidism, unspecified: Secondary | ICD-10-CM | POA: Diagnosis not present

## 2020-03-12 DIAGNOSIS — Z8249 Family history of ischemic heart disease and other diseases of the circulatory system: Secondary | ICD-10-CM | POA: Diagnosis not present

## 2020-03-12 DIAGNOSIS — Z95818 Presence of other cardiac implants and grafts: Secondary | ICD-10-CM

## 2020-03-12 DIAGNOSIS — N3001 Acute cystitis with hematuria: Secondary | ICD-10-CM | POA: Diagnosis not present

## 2020-03-12 DIAGNOSIS — D649 Anemia, unspecified: Secondary | ICD-10-CM | POA: Diagnosis not present

## 2020-03-12 DIAGNOSIS — R001 Bradycardia, unspecified: Secondary | ICD-10-CM | POA: Diagnosis not present

## 2020-03-12 DIAGNOSIS — M8580 Other specified disorders of bone density and structure, unspecified site: Secondary | ICD-10-CM | POA: Diagnosis present

## 2020-03-12 DIAGNOSIS — Z20822 Contact with and (suspected) exposure to covid-19: Secondary | ICD-10-CM | POA: Diagnosis not present

## 2020-03-12 DIAGNOSIS — R55 Syncope and collapse: Secondary | ICD-10-CM

## 2020-03-12 DIAGNOSIS — Z809 Family history of malignant neoplasm, unspecified: Secondary | ICD-10-CM | POA: Diagnosis not present

## 2020-03-12 DIAGNOSIS — I441 Atrioventricular block, second degree: Secondary | ICD-10-CM | POA: Diagnosis not present

## 2020-03-12 DIAGNOSIS — M549 Dorsalgia, unspecified: Secondary | ICD-10-CM | POA: Diagnosis not present

## 2020-03-12 DIAGNOSIS — Z853 Personal history of malignant neoplasm of breast: Secondary | ICD-10-CM | POA: Diagnosis not present

## 2020-03-12 DIAGNOSIS — I499 Cardiac arrhythmia, unspecified: Secondary | ICD-10-CM | POA: Diagnosis not present

## 2020-03-12 DIAGNOSIS — R6889 Other general symptoms and signs: Secondary | ICD-10-CM | POA: Diagnosis not present

## 2020-03-12 DIAGNOSIS — R079 Chest pain, unspecified: Secondary | ICD-10-CM | POA: Diagnosis not present

## 2020-03-12 DIAGNOSIS — M5489 Other dorsalgia: Secondary | ICD-10-CM | POA: Diagnosis not present

## 2020-03-12 DIAGNOSIS — Z79899 Other long term (current) drug therapy: Secondary | ICD-10-CM | POA: Diagnosis not present

## 2020-03-12 DIAGNOSIS — Z79890 Hormone replacement therapy (postmenopausal): Secondary | ICD-10-CM | POA: Diagnosis not present

## 2020-03-12 DIAGNOSIS — R002 Palpitations: Secondary | ICD-10-CM

## 2020-03-12 DIAGNOSIS — I361 Nonrheumatic tricuspid (valve) insufficiency: Secondary | ICD-10-CM | POA: Diagnosis not present

## 2020-03-12 DIAGNOSIS — Z743 Need for continuous supervision: Secondary | ICD-10-CM | POA: Diagnosis not present

## 2020-03-12 DIAGNOSIS — R0902 Hypoxemia: Secondary | ICD-10-CM | POA: Diagnosis not present

## 2020-03-12 HISTORY — DX: Bradycardia, unspecified: R00.1

## 2020-03-12 HISTORY — DX: Hyperlipidemia, unspecified: E78.5

## 2020-03-12 HISTORY — DX: Atrioventricular block, second degree: I44.1

## 2020-03-12 LAB — CBC WITH DIFFERENTIAL/PLATELET
Abs Immature Granulocytes: 0.03 10*3/uL (ref 0.00–0.07)
Basophils Absolute: 0 10*3/uL (ref 0.0–0.1)
Basophils Relative: 1 %
Eosinophils Absolute: 0.1 10*3/uL (ref 0.0–0.5)
Eosinophils Relative: 2 %
HCT: 41.9 % (ref 36.0–46.0)
Hemoglobin: 14 g/dL (ref 12.0–15.0)
Immature Granulocytes: 1 %
Lymphocytes Relative: 32 %
Lymphs Abs: 2 10*3/uL (ref 0.7–4.0)
MCH: 30.7 pg (ref 26.0–34.0)
MCHC: 33.4 g/dL (ref 30.0–36.0)
MCV: 91.9 fL (ref 80.0–100.0)
Monocytes Absolute: 0.5 10*3/uL (ref 0.1–1.0)
Monocytes Relative: 8 %
Neutro Abs: 3.6 10*3/uL (ref 1.7–7.7)
Neutrophils Relative %: 56 %
Platelets: 228 10*3/uL (ref 150–400)
RBC: 4.56 MIL/uL (ref 3.87–5.11)
RDW: 13.7 % (ref 11.5–15.5)
WBC: 6.2 10*3/uL (ref 4.0–10.5)
nRBC: 0 % (ref 0.0–0.2)

## 2020-03-12 LAB — COMPREHENSIVE METABOLIC PANEL
ALT: 16 U/L (ref 0–44)
AST: 25 U/L (ref 15–41)
Albumin: 3.7 g/dL (ref 3.5–5.0)
Alkaline Phosphatase: 93 U/L (ref 38–126)
Anion gap: 8 (ref 5–15)
BUN: 17 mg/dL (ref 8–23)
CO2: 29 mmol/L (ref 22–32)
Calcium: 8.6 mg/dL — ABNORMAL LOW (ref 8.9–10.3)
Chloride: 105 mmol/L (ref 98–111)
Creatinine, Ser: 0.77 mg/dL (ref 0.44–1.00)
GFR calc Af Amer: >60 mL/min (ref >60)
GFR calc non Af Amer: >60 mL/min (ref >60)
Glucose, Bld: 100 mg/dL — ABNORMAL HIGH (ref 70–99)
Potassium: 3.7 mmol/L (ref 3.5–5.1)
Sodium: 142 mmol/L (ref 135–145)
Total Bilirubin: 0.4 mg/dL (ref 0.3–1.2)
Total Protein: 6.6 g/dL (ref 6.5–8.1)

## 2020-03-12 LAB — URINALYSIS, ROUTINE W REFLEX MICROSCOPIC
Bilirubin Urine: NEGATIVE
Glucose, UA: NEGATIVE mg/dL
Ketones, ur: NEGATIVE mg/dL
Nitrite: NEGATIVE
Protein, ur: NEGATIVE mg/dL
Specific Gravity, Urine: 1.003 — ABNORMAL LOW (ref 1.005–1.030)
pH: 7 (ref 5.0–8.0)

## 2020-03-12 LAB — LACTIC ACID, PLASMA
Lactic Acid, Venous: 1.4 mmol/L (ref 0.5–1.9)
Lactic Acid, Venous: 1.4 mmol/L (ref 0.5–1.9)

## 2020-03-12 LAB — POCT URINALYSIS DIPSTICK
Bilirubin, UA: NEGATIVE
Blood, UA: POSITIVE
Glucose, UA: NEGATIVE
Ketones, UA: POSITIVE
Nitrite, UA: NEGATIVE
Protein, UA: POSITIVE — AB
Spec Grav, UA: 1.025 (ref 1.010–1.025)
Urobilinogen, UA: 0.2 E.U./dL
pH, UA: 5.5 (ref 5.0–8.0)

## 2020-03-12 LAB — TROPONIN I (HIGH SENSITIVITY)
Troponin I (High Sensitivity): 5 ng/L (ref <18)
Troponin I (High Sensitivity): 7 ng/L (ref <18)

## 2020-03-12 LAB — MAGNESIUM: Magnesium: 2.2 mg/dL (ref 1.7–2.4)

## 2020-03-12 LAB — TSH: TSH: 0.669 u[IU]/mL (ref 0.350–4.500)

## 2020-03-12 LAB — SARS CORONAVIRUS 2 BY RT PCR (HOSPITAL ORDER, PERFORMED IN ~~LOC~~ HOSPITAL LAB): SARS Coronavirus 2: NEGATIVE

## 2020-03-12 LAB — LIPASE, BLOOD: Lipase: 35 U/L (ref 11–51)

## 2020-03-12 NOTE — H&P (Addendum)
Cardiology Admission History and Physical:   Patient ID: Tiffany Berg; MRN: [id number]; DOB: 07-26-1956   Admission date: 03/12/2020  Primary Care Provider: Martinique, Betty G, MD Primary Cardiologist: Candee Furbish, MD new Primary Electrophysiologist: None    Chief Complaint:  Bradycardia   Patient Profile:   Tiffany Berg is a 64 y.o. female with a history of HLD, hypothyroidism, breast CA, depression, she came to the hospital from PCP office for bradycardia.  History of Present Illness:   Ms. Reder has been having problems w/ DOE, presyncope w/ exertion for 5-6 months. She has also been getting chest pain w/ these episodes, mild and is described as a pressure in the middle of her chest.   Prior to the last 6 months, she was not limited in her activity by chest pain or SOB.   She has never been told she has a low HR.   She does not check her BP at home, just at the clinic.   She has daytime LE edema, does not wake with it.   She has regular palpitations, feels her heart going too fast. Episodes cause sx, make her anxious. She gets these almost every day. Does not know how fast her HR is going when it does that.   She went to the doctor today for lower back pain. Still having pain, not as bad.   Past Medical History:  Diagnosis Date  . Cancer ([place])    left breast  . Depression    undiagnosed- pt feels like crying sometimes for no reason  . Headache   . Hyperlipidemia   . Hypothyroidism   . Mobitz II 03/12/2020  . Symptomatic bradycardia 03/12/2020    Past Surgical History:  Procedure Laterality Date  . ANTERIOR AND POSTERIOR REPAIR N/A 02/18/2015   Procedure: ANTERIOR (CYSTOCELE) AND POSTERIOR REPAIR (RECTOCELE);  Surgeon: Everett Graff, MD;  Location: [facility];  Service: Gynecology;  Laterality: N/A;  . BLADDER SUSPENSION N/A 02/18/2015   Procedure: TRANSVAGINAL TAPE (TVT) PROCEDURE;  Surgeon: Everett Graff, MD;  Location: [facility];  Service: Gynecology;  Laterality: N/A;    . BREAST SURGERY    . CYSTOSCOPY Bilateral 02/18/2015   Procedure: CYSTOSCOPY;  Surgeon: Everett Graff, MD;  Location: [facility];  Service: Gynecology;  Laterality: Bilateral;  . LAPAROSCOPIC ASSISTED VAGINAL HYSTERECTOMY N/A 02/18/2015   Procedure: LAPAROSCOPIC ASSISTED VAGINAL HYSTERECTOMY;  Surgeon: Everett Graff, MD;  Location: [facility];  Service: Gynecology;  Laterality: N/A;  . SALPINGOOPHORECTOMY Bilateral 02/18/2015   Procedure: BILATERAL SALPINGO OOPHORECTOMY;  Surgeon: Everett Graff, MD;  Location: [facility];  Service: Gynecology;  Laterality: Bilateral;     Medications Prior to Admission: Prior to Admission medications   Medication Sig Start Date End Date Taking? Authorizing Provider  aspirin EC 81 MG tablet Take 81 mg by mouth daily.    [provider]  celecoxib (CELEBREX) 200 MG capsule Take 1 capsule (200 mg total) by mouth 2 (two) times daily as needed. 11/12/19   Hilts, Legrand Como, MD  docusate sodium (COLACE) 100 MG capsule Take 100 mg by mouth 3 (three) times daily.     [provider]  ferrous sulfate 325 (65 FE) MG tablet Take 325 mg by mouth daily with breakfast.    [provider]  gabapentin (NEURONTIN) 100 MG capsule Take 2 capsules (200 mg total) by mouth at bedtime. 12/05/19   Martinique, Betty G, MD  levothyroxine (SYNTHROID) 75 MCG tablet Take 1 tablet (75 mcg total) by mouth daily before breakfast. 08/23/19  Martinique, Betty G, MD  lovastatin (MEVACOR) 40 MG tablet Take 1 tablet (40 mg total) by mouth at bedtime. 08/27/19   Martinique, Betty G, MD  tamoxifen (NOLVADEX) 20 MG tablet Take 20 mg by mouth daily.    [provider]  tiZANidine (ZANAFLEX) 2 MG tablet Take 1-2 tablets (2-4 mg total) by mouth every 6 (six) hours as needed for muscle spasms. 11/12/19   Hilts, Legrand Como, MD  traZODone (DESYREL) 50 MG tablet Take 0.5-1 tablets (25-50 mg total) by mouth at bedtime as needed for sleep. 08/20/19   Martinique, Betty G, MD  zolpidem (AMBIEN) 5 MG tablet  Take 5 mg by mouth at bedtime as needed. 10/09/19   [provider]     Allergies:   No Known Allergies  Social History:   Social History   Socioeconomic History  . Marital status: Legally Separated    Spouse name: Not on file  . Number of children: Not on file  . Years of education: Not on file  . Highest education level: Not on file  Occupational History  . Not on file  Tobacco Use  . Smoking status: Never Smoker  . Smokeless tobacco: Never Used  Substance and Sexual Activity  . Alcohol use: Yes    Comment: socially  . Drug use: No  . Sexual activity: Yes    Birth control/protection: Post-menopausal, Surgical  Other Topics Concern  . Not on file  Social History Narrative  . Not on file   Social Determinants of Health   Financial Resource Strain:   . Difficulty of Paying Living Expenses:   Food Insecurity:   . Worried About Charity fundraiser in the Last Year:   . Arboriculturist in the Last Year:   Transportation Needs:   . Film/video editor (Medical):   Marland Kitchen Lack of Transportation (Non-Medical):   Physical Activity:   . Days of Exercise per Week:   . Minutes of Exercise per Session:   Stress:   . Feeling of Stress :   Social Connections:   . Frequency of Communication with Friends and Family:   . Frequency of Social Gatherings with Friends and Family:   . Attends Religious Services:   . Active Member of Clubs or Organizations:   . Attends Archivist Meetings:   Marland Kitchen Marital Status:   Intimate Partner Violence:   . Fear of Current or Ex-Partner:   . Emotionally Abused:   Marland Kitchen Physically Abused:   . Sexually Abused:     Family History:  The patient's family history includes Cancer in her father; Cancer (age of onset: 5) in her sister; Heart disease in her mother.   The patient She indicated that her mother is deceased. She indicated that her father is deceased. She indicated that her sister is alive.  ROS:  Please see the history of  present illness.  All other ROS reviewed and negative.     Physical Exam/Data:   Vitals:   03/12/20 1756 03/12/20 1915  BP: (!) 172/68 (!) 143/58  Pulse: (!) 49 (!) 36  Resp: 19 17  Temp: 97.6 F (36.4 C)   TempSrc: Oral   SpO2: 93% 99%   No intake or output data in the 24 hours ending 03/12/20 2014 There were no vitals filed for this visit. There is no height or weight on file to calculate BMI.  General:  Well nourished, well developed, female in no acute distress HEENT: normal Lymph: no adenopathy Neck:  JVD not elevated Endocrine:  No thryomegaly Vascular: No carotid bruits; 4/4 extremity pulses 2+ bilaterally  Cardiac:  normal S1, S2; RRR; no murmur, no rub or gallop  Lungs:  clear to auscultation bilaterally, no wheezing, rhonchi or rales  Abd: soft, nontender, no hepatomegaly  Ext: no edema Musculoskeletal:  No deformities, BUE and BLE strength normal and equal Skin: warm and dry  Neuro:  CNs 2-12 intact, no focal abnormalities noted Psych:  Normal affect    EKG:  The ECG was personally reviewed: Mobitz II w/ 2:1 block HR 40 Telemetry: 2:1 block  Relevant CV Studies:  None  Laboratory Data:  Chemistry Recent Labs  Lab 03/12/20 1858  NA 142  K 3.7  CL 105  CO2 29  GLUCOSE 100*  BUN 17  CREATININE 0.77  CALCIUM 8.6*  GFRNONAA >60  GFRAA >60  ANIONGAP 8    Recent Labs  Lab 03/12/20 1858  PROT 6.6  ALBUMIN 3.7  AST 25  ALT 16  ALKPHOS 93  BILITOT 0.4   Hematology Recent Labs  Lab 03/12/20 1858  WBC 6.2  RBC 4.56  HGB 14.0  HCT 41.9  MCV 91.9  MCH 30.7  MCHC 33.4  RDW 13.7  PLT 228   Cardiac Enzymes  High Sensitivity Troponin:   Recent Labs  Lab 03/12/20 1858  TROPONINIHS 5     BNPNo results for input(s): BNP, PROBNP in the last 168 hours.   Lipids:  Lab Results  Component Value Date   CHOL 266 (H) 08/20/2019   HDL 62.10 08/20/2019   LDLCALC 177 (H) 08/20/2019   TRIG 135.0 08/20/2019   CHOLHDL 4 08/20/2019   INR:   Lab Results  Component Value Date   INR 1.0 06/28/2019   A1c:  Lab Results  Component Value Date   HGBA1C 6.0 08/20/2019   Thyroid:  Lab Results  Component Value Date   TSH 0.669 03/12/2020    Radiology/Studies:  No results found.  Assessment and Plan:   1. Symptomatic bradycardia 2nd Mobitz II - no rate-lowering rx - TSH is ok - BP ok so is not unstable. - follow on telemetry, will need EP consult - possible PPM on Monday - ck echo, needs ischemic eval as well to rule out CAD as a cause.   Principal Problem:   Symptomatic bradycardia Active Problems:   Mobitz II   For questions or updates, please contact [facility] Please consult [email or web address] for contact info under Cardiology/STEMI.    Signed, Rosaria Ferries, PA-C  03/12/2020 8:14 PM   Personally seen and examined. Agree with above.   64 year old with newly discovered high degree AV block.  Telemetry in emergency department personally reviewed demonstrates brief episode of second-degree heart block type I, no third-degree AV block, majority 2-1 AV block.  She has been symptomatic, more shortness of breath than usual.  No offending medications or AV nodal blocking agents.  TSH is normal.  No recent travel, no tick bites.  Her daughter is present for historical translation.  Lab work reviewed.  Unremarkable.  EKG personally reviewed-2-1 AV block.  Symptomatic bradycardia 2-1 AV block Secondary heart block type I  -Checking echocardiogram -No need at this time for temporary pacemaker wire.  Obviously if heart rate becomes unstable, pacer wire was discussed and she is amenable.  He is mentating well, good blood pressure, no shortness of breath. -With her symptoms, warrants pacemaker. -one could consider heart catheterization prior to exclude any significant CAD which could contribute to  AV delay.  She has had some mild chest discomfort occasionally when she feels short of breath. -She has been having  some low back pain recently.  Urinalysis fairly unremarkable.  No leukocytosis.  No fevers.  Likely musculoskeletal.  Continue to monitor.  Candee Furbish, MD

## 2020-03-12 NOTE — ED Notes (Signed)
RN for [phone] not available for report. RN provided call back number to return phone call.

## 2020-03-12 NOTE — Progress Notes (Signed)
Mrs. Maul presented as a new patient today for surgical consultation regarding right foot bunion and hammertoes which are painful for her.  I was unable to see her today due to a previously scheduled surgery during her appointment time.  She has agreed to return for consultation and examination next week, as well as the interpreter.  No charges were entered for today's visit.  Lanae Crumbly, DPM 03/12/2020

## 2020-03-12 NOTE — Progress Notes (Addendum)
Subjective:    Patient ID: Tiffany Berg, female    DOB: 1955-12-16, 64 y.o.   MRN: [id number]  Chief Complaint  Patient presents with  . Back Pain    pt c/o lower back pain. sx started 1 day ago. Pt have not tried anything for relief.   Spanish interpreter unavailable as patient's appointment was made same day minutes prior to appointment time.  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 Martinique..  Pt endorses low back pain x 1 day.  Pain making it difficult to sit, stand, or walk.  Pt also endorses mild dysuria.  Pt denies nausea, vomiting, suprapubic pain, fever, chills.  Pt mentions having a tingling/pins-and-needles sensation in chest recently.  Past Medical History:  Diagnosis Date  . Cancer ([place])    left breast  . Depression    undiagnosed- pt feels like crying sometimes for no reason  . Headache   . Hypothyroidism     No Known Allergies  ROS General: Denies fever, chills, night sweats, changes in weight, changes in appetite HEENT: Denies headaches, ear pain, changes in vision, rhinorrhea, sore throat CV: Denies CP, palpitations, SOB, orthopnea Pulm: Denies SOB, cough, wheezing GI: Denies abdominal pain, nausea, vomiting, diarrhea, constipation GU: Denies dysuria, hematuria, frequency, vaginal discharge  +dysuria Msk: Denies muscle cramps, joint pains  +back pain Neuro: Denies weakness, numbness, tingling Skin: Denies rashes, bruising Psych: Denies depression, anxiety, hallucinations     Objective:    Blood pressure 100/60, pulse (!) 40, temperature (!) 97.4 F (36.3 C), temperature source Other (Comment), height 5\' 2"  (1.575 m), weight 154 lb (69.9 kg), SpO2 94 %.  Gen. Pleasant, well-nourished, slightly pale, appears sick but nontoxic, uncomfortable sitting in chair,  normal affect   HEENT: [place]/AT, face symmetric,no scleral icterus, PERRLA, EOMI, nares patent without drainage Lungs: no accessory muscle use, CTAB, no wheezes or rales Cardiovascular: Bradycardia, no peripheral edema Musculoskeletal: + R CVA tenderness.  No deformities, no cyanosis or clubbing, normal tone Neuro:  A&Ox3, CN II-XII intact, normal gait Skin:  Warm, no lesions/ rash       Wt Readings from Last 3 Encounters:  03/12/20 154 lb (69.9 kg)  12/02/19 155 lb 3.2 oz (70.4 kg)  10/01/19 156 lb (70.8 kg)    Lab Results  Component Value Date   WBC 5.6 06/28/2019   HGB 14.4 06/28/2019   HCT 41.1 06/28/2019   PLT 246 06/28/2019   GLUCOSE 138 (H) 06/28/2019   CHOL 266 (H) 08/20/2019   TRIG 135.0 08/20/2019   HDL 62.10 08/20/2019   LDLCALC 177 (H) 08/20/2019   ALT 22 10/01/2019   AST 27 10/01/2019   NA 140 06/28/2019   K 3.7 06/28/2019   CL 100 06/28/2019   CREATININE 0.66 06/28/2019   BUN 14 06/28/2019   CO2 27 06/28/2019   TSH 2.42 08/20/2019   INR 1.0 06/28/2019   HGBA1C 6.0 08/20/2019    Assessment/Plan: Pt is a 64 year old female seen in clinic for low back pain x1 day.  UA consistent with cystitis.  Concern for urosepsis given marked bradycardia (VR 35).  EKG also with second-degree heart block.  Given current symptoms patient to proceed to ED for further evaluation including labs, IV fluids, imaging, and cardiac monitoring.  EMS called for transport.  Pt in agreement.  No aspirin given in clinic.   Low back pain, unspecified back pain laterality, unspecified chronicity, unspecified whether sciatica present  - Plan: POC Urinalysis Dipstick, CBC with Differential/Platelet  Bradycardia -EKG this visit with significant bradycardia- ventricular rate 35 and second-degree heart block. -EKG 06/28/2019 and 03/19/2015 reviewed for comparison.  Heart rate typically mid 50s to 60s  - Plan: EKG 12-Lead, CBC with Differential/Platelet, Basic metabolic  panel  Dysuria -given 16.9 oz of water in clinic -Plan: POC UA  Acute cystitis with hematuria  -concern for pyelo and possible urosepsis - Plan: Urine Culture, CBC with Differential/Platelet, Basic metabolic panel  F/u prn  EKG may not be visible in Epic.  Pic provided above.  Grier Mitts, MD

## 2020-03-12 NOTE — ED Triage Notes (Signed)
Patient sent from PCP for bradycardia. Patient was being seen initially for a UTI, but was found to have a heart rate approximately 40 bpm. Patient alert and oriented at this time, denies chest pain, shortness of breath. Feels dizzy when standing.

## 2020-03-12 NOTE — ED Provider Notes (Signed)
Tiffany Berg Provider Note   CSN: [id number] Arrival date & time: 03/12/20  1744     History Chief Complaint  Patient presents with   Bradycardia    Tiffany Berg is a 64 y.o. female.  The history is provided by the patient and medical records. No language interpreter was used.  Near Syncope This is a new problem. The current episode started yesterday. The problem occurs rarely. The problem has not changed since onset.Associated symptoms include chest pain. Pertinent negatives include no abdominal pain, no headaches and no shortness of breath. Nothing aggravates the symptoms. Nothing relieves the symptoms. She has tried nothing for the symptoms. The treatment provided no relief.       Past Medical History:  Diagnosis Date   Cancer [facility])    left breast   Depression    undiagnosed- pt feels like crying sometimes for no reason   Headache    Hypothyroidism     Patient Active Problem List   Diagnosis Date Noted   Osteopenia 12/02/2019   Bilateral low back pain 10/02/2019   Pelvic prolapse 02/18/2015    Past Surgical History:  Procedure Laterality Date   ANTERIOR AND POSTERIOR REPAIR N/A 02/18/2015   Procedure: ANTERIOR (CYSTOCELE) AND POSTERIOR REPAIR (RECTOCELE);  Surgeon: Everett Graff, MD;  Location: [facility];  Service: Gynecology;  Laterality: N/A;   BLADDER SUSPENSION N/A 02/18/2015   Procedure: TRANSVAGINAL TAPE (TVT) PROCEDURE;  Surgeon: Everett Graff, MD;  Location: [facility];  Service: Gynecology;  Laterality: N/A;   BREAST SURGERY     CYSTOSCOPY Bilateral 02/18/2015   Procedure: CYSTOSCOPY;  Surgeon: Everett Graff, MD;  Location: [facility];  Service: Gynecology;  Laterality: Bilateral;   LAPAROSCOPIC ASSISTED VAGINAL HYSTERECTOMY N/A 02/18/2015   Procedure: LAPAROSCOPIC ASSISTED VAGINAL HYSTERECTOMY;  Surgeon: Everett Graff, MD;  Location: [facility];  Service: Gynecology;  Laterality: N/A;   SALPINGOOPHORECTOMY  Bilateral 02/18/2015   Procedure: BILATERAL SALPINGO OOPHORECTOMY;  Surgeon: Everett Graff, MD;  Location: [facility];  Service: Gynecology;  Laterality: Bilateral;     OB History   No obstetric history on file.     Family History  Problem Relation Age of Onset   Heart disease Mother    Cancer Father        prostata   Cancer Sister 47       Breast    Social History   Tobacco Use   Smoking status: Never Smoker   Smokeless tobacco: Never Used  Substance Use Topics   Alcohol use: Yes    Comment: socially   Drug use: No    Home Medications Prior to Admission medications   Medication Sig Start Date End Date Taking? Authorizing Provider  aspirin EC 81 MG tablet Take 81 mg by mouth daily.    [provider]  celecoxib (CELEBREX) 200 MG capsule Take 1 capsule (200 mg total) by mouth 2 (two) times daily as needed. 11/12/19   Hilts, Legrand Como, MD  docusate sodium (COLACE) 100 MG capsule Take 100 mg by mouth 3 (three) times daily.     [provider]  ferrous sulfate 325 (65 FE) MG tablet Take 325 mg by mouth daily with breakfast.    [provider]  gabapentin (NEURONTIN) 100 MG capsule Take 2 capsules (200 mg total) by mouth at bedtime. 12/05/19   Martinique, Betty G, MD  levothyroxine (SYNTHROID) 75 MCG tablet Take 1 tablet (75 mcg total) by mouth daily before breakfast. 08/23/19   Martinique, Betty G, MD  lovastatin (MEVACOR) 40 MG tablet Take 1 tablet (40 mg total) by mouth at bedtime. 08/27/19   Martinique, Betty G, MD  tamoxifen (NOLVADEX) 20 MG tablet Take 20 mg by mouth daily.    [provider]  tiZANidine (ZANAFLEX) 2 MG tablet Take 1-2 tablets (2-4 mg total) by mouth every 6 (six) hours as needed for muscle spasms. 11/12/19   Hilts, Legrand Como, MD  traZODone (DESYREL) 50 MG tablet Take 0.5-1 tablets (25-50 mg total) by mouth at bedtime as needed for sleep. 08/20/19   Martinique, Betty G, MD  zolpidem (AMBIEN) 5 MG tablet Take 5 mg by mouth at bedtime as  needed. 10/09/19   [provider]    Allergies    Patient has no known allergies.  Review of Systems   Review of Systems  Constitutional: Positive for chills and fatigue. Negative for diaphoresis and fever.  HENT: Negative for congestion.   Eyes: Negative for visual disturbance.  Respiratory: Negative for cough, chest tightness, shortness of breath and wheezing.   Cardiovascular: Positive for chest pain, palpitations and near-syncope. Negative for leg swelling.  Gastrointestinal: Positive for diarrhea. Negative for abdominal distention, abdominal pain, constipation, nausea and vomiting.  Genitourinary: Negative for dysuria, flank pain and frequency.  Musculoskeletal: Negative for back pain, neck pain and neck stiffness.  Skin: Negative for rash and wound.  Neurological: Negative for dizziness, weakness, light-headedness, numbness and headaches.  Psychiatric/Behavioral: Negative for agitation and confusion.  All other systems reviewed and are negative.   Physical Exam Updated Vital Signs BP (!) 172/68 (BP Location: Right Arm)    Pulse (!) 49    Temp 97.6 F (36.4 C) (Oral)    Resp 19    SpO2 93%   Physical Exam Vitals and nursing note reviewed.  Constitutional:      General: She is not in acute distress.    Appearance: She is well-developed. She is not ill-appearing, toxic-appearing or diaphoretic.  HENT:     Head: Normocephalic and atraumatic.     Nose: No congestion or rhinorrhea.     Mouth/Throat:     Mouth: Mucous membranes are moist.     Pharynx: No oropharyngeal exudate or posterior oropharyngeal erythema.  Eyes:     Conjunctiva/sclera: Conjunctivae normal.     Pupils: Pupils are equal, round, and reactive to light.  Cardiovascular:     Rate and Rhythm: Bradycardia present. Rhythm irregular.     Pulses: Normal pulses.     Heart sounds: No murmur heard.   Pulmonary:     Effort: Pulmonary effort is normal. No respiratory distress.     Breath sounds: Normal  breath sounds. No wheezing, rhonchi or rales.  Chest:     Chest wall: No tenderness.  Abdominal:     General: Abdomen is flat.     Palpations: Abdomen is soft.     Tenderness: There is no abdominal tenderness. There is no right CVA tenderness, left CVA tenderness, guarding or rebound.  Musculoskeletal:        General: No tenderness or signs of injury.     Cervical back: Neck supple. No tenderness.     Right lower leg: No edema.     Left lower leg: No edema.  Skin:    General: Skin is warm and dry.     Capillary Refill: Capillary refill takes less than 2 seconds.     Findings: No erythema.  Neurological:     General: No focal deficit present.     Mental Status:  She is alert.  Psychiatric:        Mood and Affect: Mood normal.     ED Results / Procedures / Treatments   Labs (all labs ordered are listed, but only abnormal results are displayed) Labs Reviewed  COMPREHENSIVE METABOLIC PANEL - Abnormal; Notable for the following components:      Result Value   Glucose, Bld 100 (*)    Calcium 8.6 (*)    All other components within normal limits  URINALYSIS, ROUTINE W REFLEX MICROSCOPIC - Abnormal; Notable for the following components:   Color, Urine STRAW (*)    Specific Gravity, Urine 1.003 (*)    Hgb urine dipstick SMALL (*)    Leukocytes,Ua TRACE (*)    Bacteria, UA FEW (*)    All other components within normal limits  SARS CORONAVIRUS 2 BY RT PCR (HOSPITAL ORDER, [facility])  URINE CULTURE  CBC WITH DIFFERENTIAL/PLATELET  TSH  MAGNESIUM  LACTIC ACID, PLASMA  LACTIC ACID, PLASMA  LIPASE, BLOOD  TROPONIN I (HIGH SENSITIVITY)  TROPONIN I (HIGH SENSITIVITY)    EKG EKG Interpretation  Date/Time:  Friday March 12 2020 18:33:54 EDT Ventricular Rate:  40 PR Interval:    QRS Duration: 68 QT Interval:  542 QTC Calculation: 443 R Axis:   86 Text Interpretation: Sinus bradycardia Ventricular premature complex Second deg AVB, Mobitz I  (Wenckebach) Borderline right axis deviation Borderline T abnormalities, diffuse leads When compared to ECG before today, new heart block. No STEMI Confirmed by Antony Blackbird [phone]) on 03/12/2020 7:26:51 PM   Radiology No results found.  Procedures Procedures (including critical care time)    Medications Ordered in ED Medications - No data to display  ED Course  I have reviewed the triage vital signs and the nursing notes.  Pertinent labs & imaging results that were available during my care of the patient were reviewed by me and considered in my medical decision making (see chart for details).    MDM Rules/Calculators/A&P                          Kerryann Allaire is a 64 y.o. female with a past medical history significant for prior breast cancer, hypothyroidism, depression, and osteopenia who presents from PCP for evaluation of bradycardia, fatigue, and likely urinary tract infection.  According to documentation from PCP, patient has had some urinary frequency and low back pain for the last few days.  Patient had a urinalysis there which showed some leukocytes and bacteria concerning for a mild UTI.  While they were evaluating her, she was feeling lightheaded and like she might pass out and was found to have a heart rate in the mid 30s.  There EKG showed possible heart block and so she was brought in by EMS for evaluation.  Patient reports she is having chest pains on and off that feels like pokes and sticking type pain.  She reports no syncopal episodes but does feel very lightheaded.  She reports that she has had near syncopal episodes when she stands.  She reports no nausea or vomiting, constipation, or diarrhea.  She does report the urinary frequency.  She reports no significant headache or neck pain.  She does report some chills and feeling cold.  She has not had any recent change in medications and has not recently changed her thyroid medication.  She denies trauma.  She reports her  chest pain is up to 10 out of  10 at times.  She is feeling palpitations.  EKG which I reviewed is concerning for either a second-degree or possibly surgery heart block.  Will call cardiology to have them look at the EKG.  Patient will have labs to evaluate for electrolyte imbalance, hypothyroidism worsening, or occult infection.  We will repeat urine and culture.  We will get a chest x-ray.  Will place the patient on telemetry monitoring.  Anticipate admission for symptomatic bradycardia.   6:56 PM Just spoke with the STEMI cardiologist who looked at the EKG.  He does not think is a third-degree block and thinks it is likely a type I or type II second-degree block.  He is going to call the regular consult cardiology team to come to the patient and he agrees with getting a work-up to look for electrolyte imbalance, other infection, thyroid disease, or other problem precipitating this.  Anticipate discussion with the consultation cardiology team after work-up is completed to determine disposition however anticipate admission due to heart rate in the 30s with near syncope and new heart block.  Cardiology came to the patient and will admit to their service for further management.  He agreed on holding antibiotics as the urine was not convincing for recurrent UTI.  They suspect unless she improves, she will likely need a pacemaker.   Final Clinical Impression(s) / ED Diagnoses Final diagnoses:  Bradycardia  Symptomatic bradycardia  Syncope, unspecified syncope type  Palpitations  Chest pain, unspecified type    Clinical Impression: 1. Bradycardia   2. Symptomatic bradycardia   3. Syncope, unspecified syncope type   4. Palpitations   5. Chest pain, unspecified type     Disposition: Admit  This note was prepared with assistance of Dragon voice recognition software. Occasional wrong-word or sound-a-like substitutions may have occurred due to the inherent limitations of voice recognition  software.      Acelin Ferdig, Gwenyth Allegra, MD 03/12/20 2300

## 2020-03-12 NOTE — Addendum Note (Signed)
Addended by: Zacarias Pontes on: 03/12/2020 05:24 PM   Modules accepted: Orders

## 2020-03-13 ENCOUNTER — Inpatient Hospital Stay (HOSPITAL_COMMUNITY): Payer: Medicare Other

## 2020-03-13 ENCOUNTER — Encounter (HOSPITAL_COMMUNITY): Payer: Self-pay | Admitting: Cardiology

## 2020-03-13 DIAGNOSIS — R001 Bradycardia, unspecified: Secondary | ICD-10-CM

## 2020-03-13 DIAGNOSIS — I361 Nonrheumatic tricuspid (valve) insufficiency: Secondary | ICD-10-CM

## 2020-03-13 DIAGNOSIS — I441 Atrioventricular block, second degree: Principal | ICD-10-CM

## 2020-03-13 LAB — LIPID PANEL
Cholesterol: 242 mg/dL — ABNORMAL HIGH (ref 0–200)
HDL: 53 mg/dL (ref >40)
LDL Cholesterol: 147 mg/dL — ABNORMAL HIGH (ref 0–99)
Total CHOL/HDL Ratio: 4.6 RATIO
Triglycerides: 211 mg/dL — ABNORMAL HIGH (ref <150)
VLDL: 42 mg/dL — ABNORMAL HIGH (ref 0–40)

## 2020-03-13 LAB — HIV ANTIBODY (ROUTINE TESTING W REFLEX): HIV Screen 4th Generation wRfx: NONREACTIVE

## 2020-03-13 LAB — ECHOCARDIOGRAM COMPLETE
Height: 62 in
Weight: 2465.62 oz

## 2020-03-13 MED ORDER — ENOXAPARIN SODIUM 40 MG/0.4ML ~~LOC~~ SOLN
40.0000 mg | Freq: Every day | SUBCUTANEOUS | Status: DC
  Administered 2020-03-13 – 2020-03-14 (×3): 40 mg via SUBCUTANEOUS
  Filled 2020-03-13 (×3): qty 0.4

## 2020-03-13 MED ORDER — ALPRAZOLAM 0.25 MG PO TABS: 0.2500 mg | ORAL_TABLET | Freq: Two times a day (BID) | ORAL | Status: DC | PRN

## 2020-03-13 MED ORDER — ZOLPIDEM TARTRATE 5 MG PO TABS
5.0000 mg | ORAL_TABLET | Freq: Every evening | ORAL | Status: DC | PRN
  Administered 2020-03-14: 5 mg via ORAL
  Filled 2020-03-13: qty 1

## 2020-03-13 MED ORDER — LEVOTHYROXINE SODIUM 75 MCG PO TABS
75.0000 ug | ORAL_TABLET | Freq: Every day | ORAL | Status: DC
  Administered 2020-03-13 – 2020-03-15 (×3): 75 ug via ORAL
  Filled 2020-03-13 (×3): qty 1

## 2020-03-13 MED ORDER — ACETAMINOPHEN 325 MG PO TABS
650.0000 mg | ORAL_TABLET | ORAL | Status: DC | PRN
  Administered 2020-03-13 – 2020-03-14 (×2): 650 mg via ORAL
  Filled 2020-03-13 (×2): qty 2

## 2020-03-13 MED ORDER — SODIUM CHLORIDE 0.9 % IV SOLN: 250.0000 mL | INTRAVENOUS | Status: DC | PRN

## 2020-03-13 MED ORDER — PERFLUTREN LIPID MICROSPHERE
1.0000 mL | INTRAVENOUS | Status: AC | PRN
  Administered 2020-03-13: 4 mL via INTRAVENOUS
  Filled 2020-03-13: qty 10

## 2020-03-13 MED ORDER — ASPIRIN EC 81 MG PO TBEC
81.0000 mg | DELAYED_RELEASE_TABLET | Freq: Every day | ORAL | Status: DC
  Administered 2020-03-13 – 2020-03-15 (×3): 81 mg via ORAL
  Filled 2020-03-13 (×3): qty 1

## 2020-03-13 MED ORDER — SODIUM CHLORIDE 0.9% FLUSH
3.0000 mL | Freq: Two times a day (BID) | INTRAVENOUS | Status: DC
  Administered 2020-03-13 – 2020-03-15 (×4): 3 mL via INTRAVENOUS

## 2020-03-13 MED ORDER — NITROGLYCERIN 0.4 MG SL SUBL: 0.4000 mg | SUBLINGUAL_TABLET | SUBLINGUAL | Status: DC | PRN

## 2020-03-13 MED ORDER — SODIUM CHLORIDE 0.9% FLUSH: 3.0000 mL | INTRAVENOUS | Status: DC | PRN

## 2020-03-13 MED ORDER — ONDANSETRON HCL 4 MG/2ML IJ SOLN: 4.0000 mg | Freq: Four times a day (QID) | INTRAMUSCULAR | Status: DC | PRN

## 2020-03-13 NOTE — Progress Notes (Signed)
Morning EKG registered as critical, HR 30-36 mobitz 2, BP 239'P systolic Dr. Loletha Grayer Nipp notified no new orders given

## 2020-03-13 NOTE — Consult Note (Addendum)
Electrophysiology Consultation:   Patient ID: Lacretia Tindall; [id number]; [birth year]   Admit date: 03/12/2020 Date of Consult: 03/13/2020  Primary Care Provider: Martinique, Betty G, MD Primary Cardiologist: Candee Furbish, MD 03/12/2020 Primary Electrophysiologist:  Chaley Castellanos Meredith Leeds, MD new   Patient Profile:   Tiffany Berg is a 64 y.o. female with a hx of HLD, hypothyroidism, breast CA, depression, who was admitted 07/09 from PCP office for bradycardia.  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  History of Present Illness:   Tiffany Berg has no hx cardiac issues.   She developed DOE, presyncope w/ exertion, and some CP w/ exertion (but SOB comes first). She went to her PCP for back pain, was found to have a low HR and was sent to the ER. She was in Mobitz I heart block w/ frequent dropped beats, HR sustaining in the 30s. Admitted by cardiology and EP asked to see.  Tiffany Berg has generally been in heart block all night. She feels weak. She cannot get out of bed and walk across the room without getting symptoms.   She has had some LE edema, but none today.   She feels significantly limited by her symptoms, cannot do anything that she is used to doing without a struggle.  She has never been told she has a low HR before. No hx syncope.    Past Medical History:  Diagnosis Date  . Cancer ([place])    left breast  . Depression    undiagnosed- pt feels like crying sometimes for no reason  . Headache   . Hyperlipidemia   . Hypothyroidism   . Mobitz II 03/12/2020  . Symptomatic bradycardia 03/12/2020    Past Surgical History:  Procedure Laterality Date  . ANTERIOR AND POSTERIOR REPAIR N/A 02/18/2015   Procedure: ANTERIOR (CYSTOCELE) AND POSTERIOR REPAIR (RECTOCELE);  Surgeon: Everett Graff, MD;  Location: [facility];  Service: Gynecology;  Laterality: N/A;  . BLADDER SUSPENSION  N/A 02/18/2015   Procedure: TRANSVAGINAL TAPE (TVT) PROCEDURE;  Surgeon: Everett Graff, MD;  Location: [facility];  Service: Gynecology;  Laterality: N/A;  . BREAST SURGERY    . CYSTOSCOPY Bilateral 02/18/2015   Procedure: CYSTOSCOPY;  Surgeon: Everett Graff, MD;  Location: [facility];  Service: Gynecology;  Laterality: Bilateral;  . LAPAROSCOPIC ASSISTED VAGINAL HYSTERECTOMY N/A 02/18/2015   Procedure: LAPAROSCOPIC ASSISTED VAGINAL HYSTERECTOMY;  Surgeon: Everett Graff, MD;  Location: [facility];  Service: Gynecology;  Laterality: N/A;  . SALPINGOOPHORECTOMY Bilateral 02/18/2015   Procedure: BILATERAL SALPINGO OOPHORECTOMY;  Surgeon: Everett Graff, MD;  Location: [facility];  Service: Gynecology;  Laterality: Bilateral;     Prior to Admission medications   Medication Sig Start Date End Date Taking? Authorizing Provider  aspirin EC 81 MG tablet Take 81 mg by mouth daily.   Yes [provider]  levothyroxine (SYNTHROID) 75 MCG tablet Take 1 tablet (75 mcg total) by mouth daily before breakfast. 08/23/19  Yes Martinique, Betty G, MD  celecoxib (CELEBREX) 200 MG capsule Take 1 capsule (200 mg total) by mouth 2 (two) times daily as needed. Patient not taking: Reported on 03/12/2020 11/12/19   Hilts, Legrand Como, MD  gabapentin (NEURONTIN) 100 MG capsule Take 2 capsules (200 mg total) by mouth at bedtime. Patient not taking: Reported on 03/12/2020 12/05/19   Martinique, Betty G, MD  lovastatin (MEVACOR) 40 MG tablet Take 1 tablet (40 mg total) by mouth  at bedtime. Patient not taking: Reported on 03/12/2020 08/27/19   Martinique, Betty G, MD  tiZANidine (ZANAFLEX) 2 MG tablet Take 1-2 tablets (2-4 mg total) by mouth every 6 (six) hours as needed for muscle spasms. Patient not taking: Reported on 03/12/2020 11/12/19   Hilts, Legrand Como, MD  traZODone (DESYREL) 50 MG tablet Take 0.5-1 tablets (25-50 mg total) by mouth at bedtime as needed for sleep. Patient not taking: Reported on 03/12/2020 08/20/19   Martinique, Betty G, MD    Inpatient  Medications: Scheduled Meds: . enoxaparin (LOVENOX) injection  40 mg Subcutaneous QHS  . sodium chloride flush  3 mL Intravenous Q12H   Continuous Infusions: . sodium chloride     PRN Meds: sodium chloride, acetaminophen, ALPRAZolam, nitroGLYCERIN, ondansetron (ZOFRAN) IV, sodium chloride flush, zolpidem  Allergies:   No Known Allergies  Social History:   Social History   Socioeconomic History  . Marital status: Legally Separated    Spouse name: Not on file  . Number of children: Not on file  . Years of education: Not on file  . Highest education level: Not on file  Occupational History  . Not on file  Tobacco Use  . Smoking status: Never Smoker  . Smokeless tobacco: Never Used  Substance and Sexual Activity  . Alcohol use: Yes    Comment: socially  . Drug use: No  . Sexual activity: Yes    Birth control/protection: Post-menopausal, Surgical  Other Topics Concern  . Not on file  Social History Narrative  . Not on file   Social Determinants of Health   Financial Resource Strain:   . Difficulty of Paying Living Expenses:   Food Insecurity:   . Worried About Charity fundraiser in the Last Year:   . Arboriculturist in the Last Year:   Transportation Needs:   . Film/video editor (Medical):   Marland Kitchen Lack of Transportation (Non-Medical):   Physical Activity:   . Days of Exercise per Week:   . Minutes of Exercise per Session:   Stress:   . Feeling of Stress :   Social Connections:   . Frequency of Communication with Friends and Family:   . Frequency of Social Gatherings with Friends and Family:   . Attends Religious Services:   . Active Member of Clubs or Organizations:   . Attends Archivist Meetings:   Marland Kitchen Marital Status:   Intimate Partner Violence:   . Fear of Current or Ex-Partner:   . Emotionally Abused:   Marland Kitchen Physically Abused:   . Sexually Abused:     Family History:   Family History  Problem Relation Age of Onset  . Heart disease Mother     . Cancer Father        prostata  . Cancer Sister 70       Breast   Family Status:  Family Status  Relation Name Status  . Mother  Deceased  . Father  Deceased  . Sister  Alive    ROS:  Please see the history of present illness.  All other ROS reviewed and negative.     Physical Exam/Data:   Vitals:   03/13/20 0015 03/13/20 0312 03/13/20 0313 03/13/20 0352  BP: (!) 139/55 137/72 137/72   Pulse: 71 (!) 45 (!) 44   Resp: 20 13 15    Temp: 98.8 F (37.1 C) 97.9 F (36.6 C)    TempSrc: Oral Oral    SpO2: 94% 96% 95%   Weight:  69.9 kg  Height:        Intake/Output Summary (Last 24 hours) at 03/13/2020 0802 Last data filed at 03/13/2020 2637 Gross per 24 hour  Intake --  Output 700 ml  Net -700 ml    Last 3 Weights 03/13/2020 03/12/2020 03/12/2020  Weight (lbs) 154 lb 1.6 oz 154 lb 154 lb  Weight (kg) 69.9 kg 69.854 kg 69.854 kg     Body mass index is 28.19 kg/m.   General:  Well nourished, well developed, female in no acute distress HEENT: normal Lymph: no adenopathy Neck: JVD - not elevated Endocrine:  No thryomegaly Vascular: No carotid bruits; 4/4 extremity pulses 2+  Cardiac:  normal S1, S2; slow but RRR; no murmur Lungs:  clear bilaterally, no wheezing, rhonchi or rales  Abd: soft, nontender, no hepatomegaly  Ext: no edema Musculoskeletal:  No deformities, BUE and BLE strength normal and equal Skin: warm and dry  Neuro:  CNs 2-12 intact, no focal abnormalities noted Psych:  Normal affect   EKG:  The EKG was personally reviewed and demonstrates:  03/13/2020 ECG is high-grade 2nd degree heart block w/ 2:1 conduction Telemetry:  Telemetry was personally reviewed and demonstrates:  Some SR, mostly Mobitz I.    CV studies:   ECHO: ordered  Laboratory Data:   Chemistry Recent Labs  Lab 03/12/20 1858  NA 142  K 3.7  CL 105  CO2 29  GLUCOSE 100*  BUN 17  CREATININE 0.77  CALCIUM 8.6*  GFRNONAA >60  GFRAA >60  ANIONGAP 8    Lab Results   Component Value Date   ALT 16 03/12/2020   AST 25 03/12/2020   ALKPHOS 93 03/12/2020   BILITOT 0.4 03/12/2020   Hematology Recent Labs  Lab 03/12/20 1858  WBC 6.2  RBC 4.56  HGB 14.0  HCT 41.9  MCV 91.9  MCH 30.7  MCHC 33.4  RDW 13.7  PLT 228   Cardiac Enzymes High Sensitivity Troponin:   Recent Labs  Lab 03/12/20 1858 03/12/20 2221  TROPONINIHS 5 7      BNPNo results for input(s): BNP, PROBNP in the last 168 hours.  DDimer No results for input(s): DDIMER in the last 168 hours. TSH:  Lab Results  Component Value Date   TSH 0.669 03/12/2020   Lipids: Lab Results  Component Value Date   CHOL 242 (H) 03/13/2020   HDL 53 03/13/2020   LDLCALC 147 (H) 03/13/2020   TRIG 211 (H) 03/13/2020   CHOLHDL 4.6 03/13/2020   HgbA1c: Lab Results  Component Value Date   HGBA1C 6.0 08/20/2019   Magnesium:  Magnesium  Date Value Ref Range Status  03/12/2020 2.2 1.7 - 2.4 mg/dL Final    Comment:    Performed at [facility], [place] [address] [place], [place] [address]     Radiology/Studies:  DG Chest Portable 1 View  Result Date: 03/12/2020 CLINICAL DATA:  Chest pain EXAM: PORTABLE CHEST 1 VIEW COMPARISON:  06/28/2019 FINDINGS: The heart size and mediastinal contours are within normal limits. Both lungs are clear. The visualized skeletal structures are unremarkable. Aortic atherosclerosis. Surgical clips at the left breast. IMPRESSION: No active disease. Electronically Signed   By: Donavan Foil M.D.   On: 03/12/2020 19:10    Assessment and Plan:   1. High-grade 2nd degree heart block:  - feel this is the cause of her symptoms - HR is sustaining in the 30s, no reversible cause found. - Permanent pacemaker insertion was discussed with the  patient fully. The patient understands that risks include but are not limited to stroke (1 in 1000), death (1 in 51), bleeding (1 in 200), PTX.  The patient understands and is willing to proceed.    2. Chest pain - sx occur  only in the setting of SOB - ez neg MI - echo pending - MD advise on further eval and its timing  3. Hypothyroid - on home dose Synthroid - TSH ok   Principal Problem:   Symptomatic bradycardia Active Problems:   Mobitz II     For questions or updates, please contact [place] Please consult [email or web address] for contact info under Cardiology/STEMI.   Signed, Rosaria Ferries, PA-C  03/13/2020 8:02 AM  I have seen and examined this patient with Rosaria Ferries.  Agree with above, note added to reflect my findings.  On exam, bradycardic, no murmurs, lungs clear.  Patient admitted to the hospital with shortness of breath, found to be bradycardic at PCPs office.  Is now in 2-1 AV block without reversible causes.  Davie Claud need a pacemaker implanted.  Risks and benefits were discussed include bleeding, tamponade, infection, pneumothorax.  Patient understands these risks and has agreed to the procedure.  We Telesha Deguzman get an echo prior to implant.  Fortunately her QRS is narrow.  She does have intermittent Mobitz 1 AV block and thus block is likely in the AV node which makes rhythm more stable.  Mansour Balboa M. Celesta Funderburk MD 03/13/2020 9:07 AM

## 2020-03-13 NOTE — Progress Notes (Signed)
  Echocardiogram 2D Echocardiogram has been performed with Definity.  Tiffany Berg 03/13/2020, 5:06 PM

## 2020-03-14 ENCOUNTER — Inpatient Hospital Stay (HOSPITAL_COMMUNITY)
Admission: EM | Disposition: A | Discharge status: Home / Self Care | Payer: Self-pay | Source: Home / Self Care | Attending: Cardiology

## 2020-03-14 HISTORY — PX: PACEMAKER IMPLANT: EP1218

## 2020-03-14 LAB — URINE CULTURE
Culture: >=100000 — AB
MICRO NUMBER:: 10686373
SPECIMEN QUALITY:: ADEQUATE

## 2020-03-14 LAB — SURGICAL PCR SCREEN
MRSA, PCR: NEGATIVE
Staphylococcus aureus: POSITIVE — AB

## 2020-03-14 SURGERY — PACEMAKER IMPLANT
Anesthesia: LOCAL

## 2020-03-14 MED ORDER — MUPIROCIN 2 % EX OINT
1.0000 "application " | TOPICAL_OINTMENT | Freq: Two times a day (BID) | CUTANEOUS | Status: DC
  Administered 2020-03-14 – 2020-03-15 (×3): 1 via NASAL
  Filled 2020-03-14 (×2): qty 22

## 2020-03-14 MED ORDER — SODIUM CHLORIDE 0.9 % IV SOLN: INTRAVENOUS | Status: DC | PRN

## 2020-03-14 MED ORDER — HEPARIN (PORCINE) IN NACL 1000-0.9 UT/500ML-% IV SOLN
INTRAVENOUS | Status: DC | PRN
  Administered 2020-03-14: 500 mL

## 2020-03-14 MED ORDER — LIDOCAINE HCL (PF) 1 % IJ SOLN
INTRAMUSCULAR | Status: AC
  Filled 2020-03-14: qty 30

## 2020-03-14 MED ORDER — CHLORHEXIDINE GLUCONATE CLOTH 2 % EX PADS
6.0000 | MEDICATED_PAD | Freq: Every day | CUTANEOUS | Status: DC
  Administered 2020-03-14: 6 via TOPICAL

## 2020-03-14 MED ORDER — CEFAZOLIN SODIUM-DEXTROSE 2-4 GM/100ML-% IV SOLN
INTRAVENOUS | Status: AC
  Filled 2020-03-14: qty 100

## 2020-03-14 MED ORDER — ONDANSETRON HCL 4 MG/2ML IJ SOLN: 4.0000 mg | Freq: Four times a day (QID) | INTRAMUSCULAR | Status: DC | PRN

## 2020-03-14 MED ORDER — SODIUM CHLORIDE 0.9 % IV SOLN: 80.0000 mg | INTRAVENOUS | Status: DC

## 2020-03-14 MED ORDER — SODIUM CHLORIDE 0.9 % IV SOLN
INTRAVENOUS | Status: AC
  Filled 2020-03-14: qty 2

## 2020-03-14 MED ORDER — ACETAMINOPHEN 325 MG PO TABS
325.0000 mg | ORAL_TABLET | ORAL | Status: DC | PRN
  Administered 2020-03-14 – 2020-03-15 (×3): 650 mg via ORAL
  Filled 2020-03-14 (×3): qty 2

## 2020-03-14 MED ORDER — HEPARIN (PORCINE) IN NACL 1000-0.9 UT/500ML-% IV SOLN
INTRAVENOUS | Status: AC
  Filled 2020-03-14: qty 500

## 2020-03-14 MED ORDER — SODIUM CHLORIDE 0.9 % IV SOLN: INTRAVENOUS | Status: DC

## 2020-03-14 MED ORDER — LIDOCAINE HCL (PF) 1 % IJ SOLN
INTRAMUSCULAR | Status: DC | PRN
  Administered 2020-03-14: 40 mL

## 2020-03-14 MED ORDER — CEFAZOLIN SODIUM-DEXTROSE 1-4 GM/50ML-% IV SOLN
1.0000 g | Freq: Four times a day (QID) | INTRAVENOUS | Status: AC
  Administered 2020-03-14 – 2020-03-15 (×3): 1 g via INTRAVENOUS
  Filled 2020-03-14 (×3): qty 50

## 2020-03-14 MED ORDER — CEFAZOLIN SODIUM-DEXTROSE 2-4 GM/100ML-% IV SOLN
2.0000 g | INTRAVENOUS | Status: AC
  Administered 2020-03-14: 2 g via INTRAVENOUS

## 2020-03-14 SURGICAL SUPPLY — 10 items
CABLE SURGICAL S-101-97-12 (CABLE) ×2 IMPLANT
ELECT DEFIB PAD ADLT CADENCE (PAD) ×1 IMPLANT
IPG PACE AZUR XT DR MRI W1DR01 (Pacemaker) IMPLANT
LEAD CAPSURE NOVUS 5076-52CM (Lead) ×1 IMPLANT
LEAD CAPSURE NOVUS 5076-58CM (Lead) ×1 IMPLANT
PACE AZURE XT DR MRI W1DR01 (Pacemaker) ×2 IMPLANT
PAD PRO RADIOLUCENT 2001M-C (PAD) ×2 IMPLANT
SHEATH 7FR PRELUDE SNAP 13 (SHEATH) ×2 IMPLANT
SHEATH 7FR PRELUDE SNAP 25 (SHEATH) IMPLANT
TRAY PACEMAKER INSERTION (PACKS) ×2 IMPLANT

## 2020-03-14 NOTE — H&P (Signed)
Tiffany Berg has presented today for surgery, with the diagnosis of second degree AV block.  The various methods of treatment have been discussed with the patient and family. After consideration of risks, benefits and other options for treatment, the patient has consented to  Procedure(s): Pacemaker implant as a surgical intervention .  Risks include but not limited to bleeding, tamponade, infection, pneumothorax, among others. The patient's history has been reviewed, patient examined, no change in status, stable for surgery.  I have reviewed the patient's chart and labs.  Questions were answered to the patient's satisfaction.    Tiffany Kashani Curt Bears, MD 03/14/2020 10:20 AM

## 2020-03-14 NOTE — Progress Notes (Signed)
Orthopedic Tech Progress Note Patient Details:  Tiffany Berg 06/10/1956 [id number]  Patient ID: [name], female   DOB: [birth year], 64 y.o.   MRN: [id number] rn says pt has arm sling  Karolee Stamps 03/14/2020, 10:47 PM

## 2020-03-14 NOTE — Progress Notes (Signed)
Progress Note  Patient Name: Tiffany Berg Date of Encounter: 03/14/2020  [facility] Cardiologist: Candee Furbish, MD   Subjective   Continues to feel weak and fatigued.  Slept well overnight, the heart rate continued to drop.  Inpatient Medications    Scheduled Meds: . aspirin EC  81 mg Oral Daily  . enoxaparin (LOVENOX) injection  40 mg Subcutaneous QHS  . levothyroxine  75 mcg Oral Q0600  . sodium chloride flush  3 mL Intravenous Q12H   Continuous Infusions: . sodium chloride     PRN Meds: sodium chloride, acetaminophen, ALPRAZolam, nitroGLYCERIN, ondansetron (ZOFRAN) IV, sodium chloride flush, zolpidem   Vital Signs    Vitals:   03/13/20 1158 03/13/20 2033 03/14/20 0300 03/14/20 0500  BP: 109/64 (!) 122/52 118/60 (!) 106/43  Pulse: (!) 42 (!) 45    Resp: 15 16  18   Temp: 97.8 F (36.6 C) 98 F (36.7 C)  97.8 F (36.6 C)  TempSrc: Oral Oral  Oral  SpO2: 99% 99%  95%  Weight:    67.9 kg  Height:        Intake/Output Summary (Last 24 hours) at 03/14/2020 0756 Last data filed at 03/13/2020 1849 Gross per 24 hour  Intake 480 ml  Output --  Net 480 ml   Last 3 Weights 03/14/2020 03/13/2020 03/12/2020  Weight (lbs) 149 lb 9.6 oz 154 lb 1.6 oz 154 lb  Weight (kg) 67.858 kg 69.9 kg 69.854 kg      Telemetry    2-1 AV block with intermittent one-to-one conduction- Personally Reviewed  ECG    Sinus rhythm, 2-1 AV block- Personally Reviewed  Physical Exam   GEN: No acute distress.   Neck: No JVD Cardiac:  Bradycardic, regular, no murmurs, rubs, or gallops.  Respiratory: Clear to auscultation bilaterally. GI: Soft, nontender, non-distended  MS: No edema; No deformity. Neuro:  Nonfocal  Psych: Normal affect   Labs    High Sensitivity Troponin:   Recent Labs  Lab 03/12/20 1858 03/12/20 2221  TROPONINIHS 5 7      Chemistry Recent Labs  Lab 03/12/20 1858  NA 142  K 3.7  CL 105  CO2 29  GLUCOSE 100*  BUN 17  CREATININE 0.77  CALCIUM 8.6*   PROT 6.6  ALBUMIN 3.7  AST 25  ALT 16  ALKPHOS 93  BILITOT 0.4  GFRNONAA >60  GFRAA >60  ANIONGAP 8     Hematology Recent Labs  Lab 03/12/20 1858  WBC 6.2  RBC 4.56  HGB 14.0  HCT 41.9  MCV 91.9  MCH 30.7  MCHC 33.4  RDW 13.7  PLT 228    BNPNo results for input(s): BNP, PROBNP in the last 168 hours.   DDimer No results for input(s): DDIMER in the last 168 hours.   Radiology    DG Chest Portable 1 View  Result Date: 03/12/2020 CLINICAL DATA:  Chest pain EXAM: PORTABLE CHEST 1 VIEW COMPARISON:  06/28/2019 FINDINGS: The heart size and mediastinal contours are within normal limits. Both lungs are clear. The visualized skeletal structures are unremarkable. Aortic atherosclerosis. Surgical clips at the left breast. IMPRESSION: No active disease. Electronically Signed   By: Donavan Foil M.D.   On: 03/12/2020 19:10   ECHOCARDIOGRAM COMPLETE  Result Date: 03/13/2020    ECHOCARDIOGRAM REPORT   Patient Name:   Tiffany Berg Date of Exam: 03/13/2020 Medical Rec #:  [id number]       Height:       62.0 in Accession #:  [id number]      Weight:       154.1 lb Date of Birth:  [birth year]        BSA:          47.711 m Patient Age:    64 years        BP:           109/64 mmHg Patient Gender: F               HR:           51 bpm. Exam Location:  Inpatient Procedure: 2D Echo, Color Doppler, Cardiac Doppler and Intracardiac            Opacification Agent                        STAT ECHO Reported to: Dr Ena Dawley on 03/13/2020 5:06:00 PM. Indications:    Abnormal ECG  History:        Patient has no prior history of Echocardiogram examinations.                 Risk Factors:Dyslipidemia. DOE. Chest pain with exertion. Heart                 block.  Sonographer:    Clayton Lefort RDCS (AE) Referring Phys: [place]  1. Left ventricular ejection fraction, by estimation, is 60 to 65%. The left ventricle has normal function. The left ventricle has no regional wall motion  abnormalities. There is mild left ventricular hypertrophy. heart block indeterminate.  2. Right ventricular systolic function is normal. The right ventricular size is normal.  3. The mitral valve is normal in structure. Trivial mitral valve regurgitation. No evidence of mitral stenosis.  4. The aortic valve is normal in structure. Aortic valve regurgitation is not visualized. Mild aortic valve sclerosis is present, with no evidence of aortic valve stenosis.  5. The inferior vena cava is normal in size with greater than 50% respiratory variability, suggesting right atrial pressure of 3 mmHg. FINDINGS  Left Ventricle: Left ventricular ejection fraction, by estimation, is 60 to 65%. The left ventricle has normal function. The left ventricle has no regional wall motion abnormalities. Definity contrast agent was given IV to delineate the left ventricular  endocardial borders. The left ventricular internal cavity size was normal in size. There is mild left ventricular hypertrophy. Heart block indeterminate. Right Ventricle: The right ventricular size is normal. No increase in right ventricular wall thickness. Right ventricular systolic function is normal. Left Atrium: Left atrial size was normal in size. Right Atrium: Right atrial size was normal in size. Pericardium: There is no evidence of pericardial effusion. Mitral Valve: The mitral valve is normal in structure. Normal mobility of the mitral valve leaflets. Trivial mitral valve regurgitation. No evidence of mitral valve stenosis. Tricuspid Valve: The tricuspid valve is normal in structure. Tricuspid valve regurgitation is mild . No evidence of tricuspid stenosis. Aortic Valve: The aortic valve is normal in structure. Aortic valve regurgitation is not visualized. Mild aortic valve sclerosis is present, with no evidence of aortic valve stenosis. Aortic valve mean gradient measures 4.0 mmHg. Aortic valve peak gradient measures 10.0 mmHg. Aortic valve area, by VTI  measures 2.12 cm. Pulmonic Valve: The pulmonic valve was normal in structure. Pulmonic valve regurgitation is not visualized. No evidence of pulmonic stenosis. Aorta: The aortic root is normal in size and structure. Venous: The inferior vena cava is normal in  size with greater than 50% respiratory variability, suggesting right atrial pressure of 3 mmHg. IAS/Shunts: No atrial level shunt detected by color flow Doppler.  LEFT VENTRICLE PLAX 2D LVIDd:         4.60 cm  Diastology LVIDs:         2.40 cm  LV e' lateral: 11.20 cm/s LV PW:         1.10 cm  LV e' medial:  8.59 cm/s LV IVS:        1.20 cm LVOT diam:     2.00 cm LV SV:         89 LV SV Index:   52 LVOT Area:     3.14 cm  RIGHT VENTRICLE RV Basal diam:  2.80 cm RV S prime:     9.36 cm/s TAPSE (M-mode): 2.2 cm LEFT ATRIUM             Index       RIGHT ATRIUM           Index LA diam:        3.70 cm 2.16 cm/m  RA Area:     13.70 cm LA Vol (A2C):   51.5 ml 30.10 ml/m RA Volume:   31.80 ml  18.58 ml/m LA Vol (A4C):   55.5 ml 32.43 ml/m LA Biplane Vol: 55.5 ml 32.43 ml/m  AORTIC VALVE AV Area (Vmax):    1.91 cm AV Area (Vmean):   2.00 cm AV Area (VTI):     2.12 cm AV Vmax:           158.00 cm/s AV Vmean:          91.900 cm/s AV VTI:            0.418 m AV Peak Grad:      10.0 mmHg AV Mean Grad:      4.0 mmHg LVOT Vmax:         95.90 cm/s LVOT Vmean:        58.400 cm/s LVOT VTI:          0.282 m LVOT/AV VTI ratio: 0.67  AORTA Ao Root diam: 3.00 cm Ao Asc diam:  2.70 cm TRICUSPID VALVE TR Peak grad:   23.8 mmHg TR Vmax:        244.00 cm/s  SHUNTS Systemic VTI:  0.28 m Systemic Diam: 2.00 cm Jenkins Rouge MD Electronically signed by Jenkins Rouge MD Signature Date/Time: 03/13/2020/9:37:57 PM    Final     Cardiac Studies   TTE 1. Left ventricular ejection fraction, by estimation, is 60 to 65%. The  left ventricle has normal function. The left ventricle has no regional  wall motion abnormalities. There is mild left ventricular hypertrophy.  heart block  indeterminate.  2. Right ventricular systolic function is normal. The right ventricular  size is normal.  3. The mitral valve is normal in structure. Trivial mitral valve  regurgitation. No evidence of mitral stenosis.  4. The aortic valve is normal in structure. Aortic valve regurgitation is  not visualized. Mild aortic valve sclerosis is present, with no evidence  of aortic valve stenosis.  5. The inferior vena cava is normal in size with greater than 50%  respiratory variability, suggesting right atrial pressure of 3 mmHg.   Patient Profile     64 y.o. female who presented to the hospital with bradycardia, found to have 2-1 AV block  Assessment & Plan    1.  High-grade second-degree AV block: Heart rate sustaining  in the 55s.  No reversible cause found.  Echo without major abnormality.  Due to that, pacemaker is indicated.  Risks and benefits were discussed.  Risks include bleeding, tamponade, infection, pneumothorax, among others.  She understands these risks and is agreed to the procedure.  2.  Hypothyroidism: Continue home dose of Synthroid.  TSH stable.  For questions or updates, please contact [place] Please consult [email or web address] for contact info under        Signed, Jerimey Burridge Meredith Leeds, MD  03/14/2020, 7:56 AM

## 2020-03-15 ENCOUNTER — Encounter (HOSPITAL_COMMUNITY): Payer: Self-pay | Admitting: Cardiology

## 2020-03-15 ENCOUNTER — Inpatient Hospital Stay (HOSPITAL_COMMUNITY): Payer: Medicare Other

## 2020-03-15 MED FILL — Lidocaine HCl Local Preservative Free (PF) Inj 1%: INTRAMUSCULAR | Qty: 30 | Status: AC

## 2020-03-15 NOTE — Discharge Instructions (Signed)
    Supplemental Discharge Instructions for  Pacemaker/Defibrillator Patients  Activity No heavy lifting or vigorous activity with your left/right arm for 6 to 8 weeks.  Do not raise your left/right arm above your head for one week.  Gradually raise your affected arm as drawn below.              03/18/2020                 03/19/2020               03/20/2020              03/21/2020 __  NO DRIVING until your wound check visit  WOUND CARE - Keep the wound area clean and dry.  Do not get this area wet, no showers until cleared to at your wound check visit - The tape/steri-strips on your wound will fall off; do not pull them off.  No bandage is needed on the site.  DO  NOT apply any creams, oils, or ointments to the wound area. - If you notice any drainage or discharge from the wound, any swelling or bruising at the site, or you develop a fever > 101? F after you are discharged home, call the office at once.  Special Instructions - You are still able to use cellular telephones; use the ear opposite the side where you have your pacemaker/defibrillator.  Avoid carrying your cellular phone near your device. - When traveling through airports, show security personnel your identification card to avoid being screened in the metal detectors.  Ask the security personnel to use the hand wand. - Avoid arc welding equipment, MRI testing (magnetic resonance imaging), TENS units (transcutaneous nerve stimulators).  Call the office for questions about other devices. - Avoid electrical appliances that are in poor condition or are not properly grounded. - Microwave ovens are safe to be near or to operate.

## 2020-03-15 NOTE — Discharge Summary (Addendum)
ELECTROPHYSIOLOGY PROCEDURE DISCHARGE SUMMARY    Patient ID: Tiffany Berg,  MRN: [id number], DOB/AGE: Mar 21, 1956 64 y.o.  Admit date: 03/12/2020 Discharge date: 03/15/2020  Primary Care Physician: [place], Betty G, MD  Primary Cardiologist: new to [facility] Electrophysiologist: new, Dr. Curt Bears  Primary Discharge Diagnosis:  1. 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 hypothyroidism  No Known Allergies   Procedures This Admission:  1.  Implantation of a MDT dual chamber PPM on 03/14/2020 by Dr Curt Bears.  The patient received a Medtronic Azure XT DR MRI SureScan (serial number [id number]) pacemaker, Medtronic model [id number] (serial number [id number]) right atrial lead and a Medtronic model 5076 (serial number [id number]) right ventricular lead  There were no immediate post procedure complications. 2.  CXR on 03/15/20 demonstrated no pneumothorax status post device implantation.   Brief HPI: Tiffany Berg is a 64 y.o. female w/PMHx of HLD, hypothyroidism, breast CA, depression, who was admitted 07/09 from PCP office for bradycardia. She developed DOE, presyncope w/ exertion, and some CP w/ exertion (but SOB comes first). She went to her PCP for back pain, was found to have a low HR and was sent to the ER. She was in Mobitz I heart block w/ frequent dropped beats, HR sustaining in the 30s She was admitted for further evaluation and management.  Hospital Course:  The patient was admitted and EP consulted noting had generally been in 2;1 heart block all night. She feels weak. She cannot get out of bed and walk across the room without getting symptoms.  Dr. Curt Bears noted fortunately her QRS is narrow.  She does have intermittent Mobitz 1 AV block and thus block is likely in the AV node which makes rhythm more stable.  No reversible cause were found,  there was some mention of a degree of CP though her  HS Trop were 5 and 7 r/o ACS and not planned for ischemic evaluation, and planned for PPM implant TTE noted LVEF 60-65% and she underwent implantation of a PPM with details as outlined above.  She  was monitored on telemetry overnight which demonstrated SR/VP and AVpacing.  Left chest was without hematoma or ecchymosis.  The device was interrogated and found to be functioning normally.  CXR was obtained and demonstrated no pneumothorax status post device implantation.  Wound care, arm mobility, and restrictions were reviewed with the patient.  The patient feels well, no CP or SOB, she was examined by Dr. Curt Bears and considered stable for discharge to home.   She c/w low back pain (that was she was going to see her PMD outpatient), she reports no fall or injury, was progressive at home, she was able to ambulate to the BR today, she denies any numbness, tingling, or decreased motor function.  In d/w Dr. Curt Bears, Shewanda Sharpe defer imagine and w/u to her PMD outpatient.  She is currently not taking statin therapy, was previously but stopped with improvement oin her labs, she was advised heart healthy diet and follow up with her PMD   [employer] video interpretorHassell Done [id number]   Physical Exam: Vitals:   03/14/20 2357 03/15/20 0328 03/15/20 0453 03/15/20 0857  BP: (!) 145/69 114/71  (!) 130/99  Pulse: 70 64  63  Resp: 20 20  15   Temp: 98 F (36.7 C) 98 F (36.7 C)  97.7 F (36.5 C)  TempSrc: Oral Oral  Oral  SpO2: 95% 95%  95%  Weight:   67.9 kg   Height:        GEN- The patient is well appearing, alert and oriented x 3 today.   HEENT: normocephalic, atraumatic; sclera clear, conjunctiva pink; hearing intact; oropharynx clear; neck supple, no JVP Lungs- CTA b/l, normal work of breathing.  No wheezes, rales, rhonchi Heart- RRR, no murmurs, rubs or gallops, PMI not laterally displaced GI- soft, non-tender, non-distended Extremities- no clubbing, cyanosis, or edema MS- no significant deformity or  atrophy Skin- warm and dry, no rash or lesion, left chest without hematoma/ecchymosis Psych- euthymic mood, full affect Neuro- no gross motor deficits, moves b/l LE with equal strength   Labs:   Lab Results  Component Value Date   WBC 6.2 03/12/2020   HGB 14.0 03/12/2020   HCT 41.9 03/12/2020   MCV 91.9 03/12/2020   PLT 228 03/12/2020    Recent Labs  Lab 03/12/20 1858  NA 142  K 3.7  CL 105  CO2 29  BUN 17  CREATININE 0.77  CALCIUM 8.6*  PROT 6.6  BILITOT 0.4  ALKPHOS 93  ALT 16  AST 25  GLUCOSE 100*    Discharge Medications:  Allergies as of 03/15/2020   No Known Allergies      Medication List     TAKE these medications    aspirin EC 81 MG tablet Take 81 mg by mouth daily.   celecoxib 200 MG capsule Commonly known as: CeleBREX Take 1 capsule (200 mg total) by mouth 2 (two) times daily as needed. Notes to patient: Patient reports not actively taking, please discuss with the prescsribing doctor   gabapentin 100 MG capsule Commonly known as: NEURONTIN Take 2 capsules (200 mg total) by mouth at bedtime. Notes to patient: Patient reports not actively taking, please discuss with the prescsribing doctor   levothyroxine 75 MCG tablet Commonly known as: SYNTHROID Take 1 tablet (75 mcg total) by mouth daily before breakfast.   lovastatin 40 MG tablet Commonly known as: MEVACOR Take 1 tablet (40 mg total) by mouth at bedtime. Notes to patient: Patient reports not actively taking, please discuss with the prescsribing doctor   tiZANidine 2 MG tablet Commonly known as: ZANAFLEX Take 1-2 tablets (2-4 mg total) by mouth every 6 (six) hours as needed for muscle spasms. Notes to patient: Patient reports not actively taking, please discuss with the prescsribing doctor   traZODone 50 MG tablet Commonly known as: DESYREL Take 0.5-1 tablets (25-50 mg total) by mouth at bedtime as needed for sleep. Notes to patient: Patient reports not actively taking, please  discuss with the prescsribing doctor               Discharge Care Instructions  (From admission, onward)           Start     Ordered   03/15/20 0000  Discharge wound care:       Comments: As noted in discharge instructions   03/15/20 1001            Disposition:  Discharge Instructions     Diet - low sodium heart healthy   Complete by: As directed    Discharge wound care:   Complete by: As directed    As noted in discharge instructions   Increase activity slowly   Complete by: As directed        Follow-up Information     [facility].   Specialty: Cardiology Why: 03/25/2020 @ 4:00PM, wound check visit  Contact information: [address] [place]        Constance Haw, MD Follow up.   Specialty: Cardiology Why: 06/29/2020 @ 1:45PM Contact information: [address] [place] [address] [phone]                 Duration of Discharge Encounter: Greater than 30 minutes including physician time.  Signed, Tommye Standard, PA-C 03/15/2020 10:04 AM  I have seen and examined this patient with Tommye Standard.  Agree with above, note added to reflect my findings.  On exam, RRR, no murmurs, lungs clear.  She is now status post Medtronic dual-chamber pacemaker for second-degree AV block.  Device functioning appropriately.  Chest x-ray and interrogation without issue.  Plan for discharge today with follow-up in device clinic.  Donyae Kohn M. Angeli Demilio MD 03/15/2020 10:15 AM

## 2020-03-15 NOTE — Care Management Important Message (Signed)
Important Message  Patient Details  Name: Tiffany Berg MRN: [id number] Date of Birth: [birth year]   Medicare Important Message Given:  Yes     Shelda Altes 03/15/2020, 9:09 AM

## 2020-03-17 ENCOUNTER — Encounter: Payer: Self-pay | Admitting: Podiatry

## 2020-03-17 ENCOUNTER — Ambulatory Visit (INDEPENDENT_AMBULATORY_CARE_PROVIDER_SITE_OTHER): Payer: Medicare Other | Admitting: Podiatry

## 2020-03-17 ENCOUNTER — Other Ambulatory Visit: Payer: Self-pay

## 2020-03-17 DIAGNOSIS — M2041 Other hammer toe(s) (acquired), right foot: Secondary | ICD-10-CM

## 2020-03-17 DIAGNOSIS — M2042 Other hammer toe(s) (acquired), left foot: Secondary | ICD-10-CM

## 2020-03-17 DIAGNOSIS — M2012 Hallux valgus (acquired), left foot: Secondary | ICD-10-CM

## 2020-03-17 DIAGNOSIS — M21612 Bunion of left foot: Secondary | ICD-10-CM | POA: Diagnosis not present

## 2020-03-17 DIAGNOSIS — M2011 Hallux valgus (acquired), right foot: Secondary | ICD-10-CM

## 2020-03-17 DIAGNOSIS — M21611 Bunion of right foot: Secondary | ICD-10-CM

## 2020-03-17 NOTE — Progress Notes (Addendum)
Subjective:  Patient ID: Tiffany Berg, female    DOB: 1956/02/16,  MRN: [id number]  Chief Complaint  Patient presents with   Foot Problem    Surgical consult    64 y.o. female presents with the above complaint. History confirmed with patient.  I plan to see her for surgical consultation last week during her visit with Dr. Paulla Dolly, however I was unable to due to a previously scheduled surgery, so asked her to return for examination of her bunions and hammertoes today and discussion of surgical intervention for these.  Following her visit here last week she had episode of bradycardia and was admitted to the hospital where she was found to have second-degree AV block and a pacemaker was implanted.  Today she feels quite well, and is still recovering from her surgery today.  She presents wearing a left arm sling and a bandages present over the left subclavian.  She states that her foot pain is symmetric and bilateral and her primary complaints are second toe pain or a painful calluses present over the hammertoe, and pain over the bump when wearing shoes on her bunions.  They both hurt equally.  She does not have pain moving the joint without shoes on.  She notes no crepitus or crunching feelings within the joint.  Site from her recent cardiac issues, she is relatively healthy and does not smoke.  Translation is provided by video conference with a translator from [place] to [place].  Objective:  Physical Exam: warm, good capillary refill, no trophic changes or ulcerative lesions, normal DP and PT pulses and normal sensory exam.  Bilateral foot exam: Severe hallux valgus present bilaterally with large dorsal medial bunion which is red and tender to palpation.  Range of motion the first metatarsophalangeal joint is maintained without crepitance.  No joint mice or spurring about the joint are palpable.  Both first metatarsophalangeal joints are mildly track bound but I can mostly reduce them into  position.  Semirigid hammertoes 2 through 5 with adductovarus of the fifth toe and dorsal lateral corns at the PIPJ here.  She has a large dorsal corn bilaterally on the second PIPJ of the second toe.  Second toe is the worst of these and is the only painful digits for her.  Radiographs: X-ray of both feet taken on 03/12/2020: Bilateral severe hallux valgus with digital contractures noted.  Moderate metatarsus adductus deformity noted.  First metatarsophalangeal joint has mild narrowing laterally, however this can be positional and overall the joint space is preserved. Assessment:   1. Hallux valgus with bunions, left   2. Hallux valgus with bunions, right   3. Hammertoe of left foot   4. Hammertoe of right foot      Plan:  Patient was evaluated and treated and all questions answered.  -Discussed the etiology and treatment including surgical and non surgical treatment for painful bunions and hammertoes.  She has exhausted all non surgical treatment prior to this visit including shoe gear changes and padding.  She desires surgical intervention. We discussed all risks including but not limited to: pain, swelling, infection, scar, numbness which may be temporary or permanent, chronic pain, stiffness, nerve pain or damage, wound healing problems, bone healing problems including delayed or non-union and recurrence. Specifically we discussed the following procedures: Bilateral Lapidus bunionectomy with second hammertoe correction likely consisting of PIPJ arthrodesis and associated soft tissue correction and capsular tendon balancing.  We discussed that this would be a staged procedure and we would likely treat  the right foot first followed by the left foot. We discussed the postoperative course including prolonged non-weightbearing in a splint and/or fracture boot for 3 to 4 weeks followed by weightbearing in the boot for another 4 to 6 weeks pending radiographic union. Given her recent development of AV  block and cardiac pacemaker implantation, I recommend we wait at least 3 months prior to surgery so that she is able to make a full recovery.  I will discuss this in further detail with her cardiologist.  I would like her to return in 4 to 5 months for surgical planning.  She states that her daughter is having a baby in November and she would like to wait until after this for surgery, we will shoot for a target date of late December 2021 or January 2022.   Return in about 5 months (around 08/02/2020) for discuss bunion and hammertoe surgery again, schedule procedure for Dec/Jan.    Lanae Crumbly, DPM 03/17/2020

## 2020-03-25 ENCOUNTER — Encounter: Payer: Self-pay | Admitting: *Deleted

## 2020-03-25 ENCOUNTER — Other Ambulatory Visit: Payer: Self-pay

## 2020-03-25 ENCOUNTER — Ambulatory Visit (INDEPENDENT_AMBULATORY_CARE_PROVIDER_SITE_OTHER): Payer: Medicare Other | Admitting: Emergency Medicine

## 2020-03-25 DIAGNOSIS — I441 Atrioventricular block, second degree: Secondary | ICD-10-CM

## 2020-03-25 LAB — CUP PACEART INCLINIC DEVICE CHECK
Brady Statistic RA Percent Paced: 20.8 %
Brady Statistic RV Percent Paced: 99.4 %
Date Time Interrogation Session: 20210722165800
Implantable Lead Implant Date: 20210711
Implantable Lead Implant Date: 20210711
Implantable Lead Location: 753859
Implantable Lead Location: 753860
Implantable Lead Model: 5076
Implantable Lead Model: 5076
Implantable Pulse Generator Implant Date: 20210711
Lead Channel Pacing Threshold Amplitude: 0.75 V
Lead Channel Pacing Threshold Amplitude: 0.75 V
Lead Channel Pacing Threshold Pulse Width: 0.4 ms
Lead Channel Pacing Threshold Pulse Width: 0.4 ms
Lead Channel Sensing Intrinsic Amplitude: 4.1 mV
Lead Channel Sensing Intrinsic Amplitude: 9.6 mV

## 2020-03-25 NOTE — Progress Notes (Signed)
Wound check appointment. Steri-strips removed. Wound without redness or edema. Incision edges approximated, wound well healed. Normal device function. Thresholds, sensing, and impedances consistent with implant measurements. Device programmed at 3.5V/auto capture programmed on for extra safety margin until 3 month visit. Histogram distribution appropriate for patient and level of activity. No mode switches or high ventricular rates noted. Patient educated about wound care, arm mobility, lifting restrictions. ROV in 3 months with Dr. Curt Bears. Next remote 06/14/20.   See scanned report.

## 2020-03-26 ENCOUNTER — Other Ambulatory Visit: Payer: Self-pay | Admitting: Cardiology

## 2020-04-14 ENCOUNTER — Telehealth: Payer: Self-pay | Admitting: Family Medicine

## 2020-04-14 DIAGNOSIS — M79651 Pain in right thigh: Secondary | ICD-10-CM

## 2020-04-14 MED ORDER — LOVASTATIN 40 MG PO TABS: 40.0000 mg | ORAL_TABLET | Freq: Every day | ORAL | 1 refills | Status: DC

## 2020-04-14 MED ORDER — GABAPENTIN 100 MG PO CAPS: 200.0000 mg | ORAL_CAPSULE | Freq: Every day | ORAL | 3 refills | Status: DC

## 2020-04-14 NOTE — Telephone Encounter (Signed)
Pt needs refills on the following medications:  lovastatin (MEVACOR) 40 MG tablet   gabapentin (NEURONTIN) 100 MG capsule   [facility], [place] - [address]  [address], [place] [address]   Speaks little english  Please advise

## 2020-04-14 NOTE — Telephone Encounter (Signed)
Rx's sent as requested. 

## 2020-04-22 ENCOUNTER — Telehealth: Payer: Self-pay | Admitting: Cardiology

## 2020-04-22 NOTE — Telephone Encounter (Signed)
Routing to device team to determine if they think it is appropriate for patient to remain out of work any longer.

## 2020-04-22 NOTE — Telephone Encounter (Signed)
Spoke with Izora Gala (DPR). Izora Gala reports that pt's left underarm/chest/back feels tight when lifting. She has full ROM with left arm without weight. 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 Pt starts back on Monday, 8/23. Requesting letter from Dr. Curt Bears to stay out of work until 9/1. Requesting call back tomorrow. Advised that Dr. Curt Bears may not reply until next week, Izora Gala aware. Routed to Dr. Curt Bears and Venida Jarvis, RN, for recommendations.

## 2020-04-22 NOTE — Telephone Encounter (Signed)
    Pt's daughter calling, she said her mother still feels a little discomfort on her surgery site and would like to know if Dr. Curt Bears can extend her return to work date to 05/05/2020

## 2020-04-26 NOTE — Telephone Encounter (Signed)
Patient is calling to follow up regarding request for return to work letter.

## 2020-04-27 ENCOUNTER — Encounter: Payer: Self-pay | Admitting: *Deleted

## 2020-04-27 NOTE — Telephone Encounter (Signed)
Patient's daughter, Daun Peacock, is calling regarding the extended work note they have been requesting since 8/19. Magdalema states that without this work note, the patient has to go to work while she is still not feeling well. If she does not show up, then she can get fired without the note. Magdalema expressed her frustration as she has been trying for almost a week to get a work note and has not heard anything from the office. Magdalema is requesting to speak with someone about her frustrations and why this has taken so long.

## 2020-04-27 NOTE — Telephone Encounter (Signed)
Daughter of the patient called again. She is anxious to get the letter today so that the patient does not loose her job. The patient goes to work even though she does not feel well

## 2020-04-27 NOTE — Telephone Encounter (Signed)
Returned call to dtr. Informed ok for pt to wait until 9/1 before returning to work. dtr states that someone will be by this afternoon to pick up work note. She appreciates our help with this.

## 2020-06-04 ENCOUNTER — Other Ambulatory Visit: Payer: Self-pay

## 2020-06-04 ENCOUNTER — Ambulatory Visit: Payer: Medicare Other | Admitting: Family Medicine

## 2020-06-07 ENCOUNTER — Encounter: Payer: Self-pay | Admitting: Family Medicine

## 2020-06-07 ENCOUNTER — Ambulatory Visit (INDEPENDENT_AMBULATORY_CARE_PROVIDER_SITE_OTHER): Payer: Medicare Other | Admitting: Family Medicine

## 2020-06-07 ENCOUNTER — Other Ambulatory Visit: Payer: Self-pay

## 2020-06-07 VITALS — BP 124/70 | HR 94 | Resp 12 | Ht 62.0 in | Wt 155.0 lb

## 2020-06-07 DIAGNOSIS — E785 Hyperlipidemia, unspecified: Secondary | ICD-10-CM

## 2020-06-07 DIAGNOSIS — R0789 Other chest pain: Secondary | ICD-10-CM | POA: Diagnosis not present

## 2020-06-07 DIAGNOSIS — R0602 Shortness of breath: Secondary | ICD-10-CM

## 2020-06-07 DIAGNOSIS — G4700 Insomnia, unspecified: Secondary | ICD-10-CM

## 2020-06-07 DIAGNOSIS — R42 Dizziness and giddiness: Secondary | ICD-10-CM

## 2020-06-07 DIAGNOSIS — R300 Dysuria: Secondary | ICD-10-CM | POA: Diagnosis not present

## 2020-06-07 NOTE — Patient Instructions (Addendum)
°  A few things to remember from today's visit:  Hyperlipidemia, unspecified hyperlipidemia type  Sensation of chest pressure - Plan: EKG 12-Lead, CBC, BASIC METABOLIC PANEL WITH GFR  SOB (shortness of breath) - Plan: EKG 12-Lead, CBC, BASIC METABOLIC PANEL WITH GFR  Dysuria - Plan: Urinalysis, Routine w reflex microscopic, Culture, Urine  If you need refills please call your pharmacy. Do not use My Chart to request refills or for acute issues that need immediate attention.    Please be sure medication list is accurate. If a new problem present, please set up appointment sooner than planned today.  Usted debera estar tomando medicamento pare el colesterol, Lovastatin 40 mg. Hoy EKG mostro que el marcapasos esta funcionando, no veo come infartos. Pero si el dolor le empeora or se siente sudorosa con dificultad para respirar llama al 911. Tiene una cita con el cardilogo el 63 de [place]. Descanse en la casa.  Pare el trazodone y siga con medicamento que esta usando para dormir.

## 2020-06-07 NOTE — Progress Notes (Signed)
HPI: Tiffany Berg is a 64 y.o. female, who is here today for 6 months follow up.   She was last seen on 12/02/19. 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 Hospitalized from 03/12/20 to 03/15/20. Still having some episodes of SOB but much better since pacemaker placement.  Today she is c/o "little" bilateral upper chest pressure sensation and SOB. Problem started this morning and has been constant.She thinks it is caused by heat and humidity at work. She feels better when she goes outdoors for a few minutes.. Pain is not radiated. No associated palpitation.  She has no AC and it is hot but has been working with same conditions since 04/2020.But has not had these symptoms before.  Next appt with cardiologist 06/14/2020. She has not had palpitations. 2 episodes of dizziness, in the past 3 days. Episodes lasts a few minutes. Like she is moving, it is not as spinning sensation,and it does not happen in bed. Feeling like she is going to "pass out." Associated diaphoresis x 1  Lab Results  Component Value Date   CREATININE 0.77 03/12/2020   BUN 17 03/12/2020   NA 142 03/12/2020   K 3.7 03/12/2020   CL 105 03/12/2020   CO2 29 03/12/2020   HLD: She is not on Lovastatin, she was not aware that dose was increased from 20 mg to 40 mg in 08/2019.  Lab Results  Component Value Date   CHOL 242 (H) 03/13/2020   HDL 53 03/13/2020   LDLCALC 147 (H) 03/13/2020   TRIG 211 (H) 03/13/2020   CHOLHDL 4.6 03/13/2020    Insomnia: She does not take Trazodone frequently because it does not help. Unisom is helping, starts working an hour after taking it. Sleeping about 7 hours.  She has seen ortho for back pain, she is taking Celebrex 200 mg daily.  Since hospital discharge she has had "little" burning sensation with urination She thinks it is caused by urine cath she had during hospitalization.  Nocturia x 2-3, for a while. She  has not noted gross hematuria,increased in frequency, or abdominal paoin. She has not noted vagina bleeding or discharge.  Review of Systems  Constitutional: Negative for activity change, appetite change, fatigue and fever.  HENT: Negative for mouth sores and nosebleeds.   Respiratory: Negative for cough and wheezing.   Gastrointestinal: Negative for abdominal pain, nausea and vomiting.       Negative for changes in bowel habits.  Neurological: Negative for syncope, weakness and headaches.  Rest of ROS, see pertinent positives sand negatives in HPI  Current Outpatient Medications on File Prior to Visit  Medication Sig Dispense Refill  . aspirin EC 81 MG tablet Take 81 mg by mouth daily.    . celecoxib (CELEBREX) 200 MG capsule Take 1 capsule (200 mg total) by mouth 2 (two) times daily as needed. 60 capsule 6  . gabapentin (NEURONTIN) 100 MG capsule Take 2 capsules (200 mg total) by mouth at bedtime. 60 capsule 3  . levothyroxine (SYNTHROID) 75 MCG tablet Take 1 tablet (75 mcg total) by mouth daily before breakfast. 90 tablet 3  . lovastatin (MEVACOR) 40 MG tablet Take 1 tablet (40 mg total) by mouth at bedtime. 90 tablet 1  . tiZANidine (ZANAFLEX) 2 MG tablet Take 1-2 tablets (2-4 mg total) by mouth every 6 (six) hours as needed for muscle spasms. 60 tablet 1   No current facility-administered medications on file  prior to visit.   Past Medical History:  Diagnosis Date  . Cancer ([place])    left breast  . Depression    undiagnosed- pt feels like crying sometimes for no reason  . Headache   . Hyperlipidemia   . Hypothyroidism   . Mobitz II 03/12/2020  . Symptomatic bradycardia 03/12/2020   No Known Allergies  Social History   Socioeconomic History  . Marital status: Legally Separated    Spouse name: Not on file  . Number of children: Not on file  . Years of education: Not on file  . Highest education level: Not on file  Occupational History  . Not on file  Tobacco Use  .  Smoking status: Never Smoker  . Smokeless tobacco: Never Used  Substance and Sexual Activity  . Alcohol use: Yes    Comment: socially  . Drug use: No  . Sexual activity: Yes    Birth control/protection: Post-menopausal, Surgical  Other Topics Concern  . Not on file  Social History Narrative  . Not on file   Social Determinants of Health   Financial Resource Strain:   . Difficulty of Paying Living Expenses: Not on file  Food Insecurity:   . Worried About Charity fundraiser in the Last Year: Not on file  . Ran Out of Food in the Last Year: Not on file  Transportation Needs:   . Lack of Transportation (Medical): Not on file  . Lack of Transportation (Non-Medical): Not on file  Physical Activity:   . Days of Exercise per Week: Not on file  . Minutes of Exercise per Session: Not on file  Stress:   . Feeling of Stress : Not on file  Social Connections:   . Frequency of Communication with Friends and Family: Not on file  . Frequency of Social Gatherings with Friends and Family: Not on file  . Attends Religious Services: Not on file  . Active Member of Clubs or Organizations: Not on file  . Attends Archivist Meetings: Not on file  . Marital Status: Not on file   Vitals:   06/07/20 1416  BP: 124/70  Pulse: 94  Resp: 12  SpO2: 95%   Body mass index is 28.35 kg/m.  Physical Exam Vitals and nursing note reviewed.  Constitutional:      General: She is not in acute distress.    Appearance: She is well-developed.  HENT:     Head: Normocephalic and atraumatic.     Mouth/Throat:     Mouth: Mucous membranes are moist.     Pharynx: Oropharynx is clear.  Eyes:     Conjunctiva/sclera: Conjunctivae normal.     Pupils: Pupils are equal, round, and reactive to light.  Cardiovascular:     Rate and Rhythm: Normal rate and regular rhythm.     Pulses:          Dorsalis pedis pulses are 2+ on the right side and 2+ on the left side.     Heart sounds: No murmur heard.    Pulmonary:     Effort: Pulmonary effort is normal. No respiratory distress.     Breath sounds: Normal breath sounds.  Chest:     Chest wall: No tenderness.  Abdominal:     Palpations: Abdomen is soft. There is no hepatomegaly or mass.     Tenderness: There is no abdominal tenderness.  Lymphadenopathy:     Cervical: No cervical adenopathy.  Skin:    General: Skin is warm.  Findings: No erythema or rash.  Neurological:     Mental Status: She is alert and oriented to person, place, and time.     Cranial Nerves: No cranial nerve deficit.     Gait: Gait normal.  Psychiatric:        Mood and Affect: Affect normal. Mood is anxious.     Comments: Well groomed, good eye contact.   ASSESSMENT AND PLAN:   Tiffany Berg was seen today for 6 months follow-up.  Orders Placed This Encounter  Procedures  . Culture, Urine  . CBC  . BASIC METABOLIC PANEL WITH GFR  . Urinalysis, Routine w reflex microscopic  . EKG 12-Lead    Lab Results  Component Value Date   CREATININE 0.78 06/07/2020   BUN 18 06/07/2020   NA 139 06/07/2020   K 4.0 06/07/2020   CL 102 06/07/2020   CO2 31 06/07/2020   Lab Results  Component Value Date   WBC 8.8 06/07/2020   HGB 14.9 06/07/2020   HCT 44.3 06/07/2020   MCV 92.5 06/07/2020   PLT 272 06/07/2020   SOB (shortness of breath) Improved since pacemaker placement. Examination today does not suggest a serious acute process. Instructed about warning signs.  Insomnia, unspecified type Stop Trazodone. Continue OTC Unisom angdgood sleep hygiene.  Hyperlipidemia, unspecified hyperlipidemia type Lovastatin is at her pharmacy, 40 mg daily. Low fat diet also recommended.  Sensation of chest pressure We discussed possible causes. EKG today:Pace rhythm. Last EKG 03/15/20 with bradycardia and AV block mobitz II  2:1. She has an appt with cardiologist 06/14/20. Note for work given, so she can stay home until she sees her cardiologist. Clearly  instructed about warning signs.  We discussed some side effects of NSAID's, recommend decreasing frequency of Celebrex.  Dysuria No other associated symptoms. Urine disp stick with + leuk. Empiric treatment with Bactrim , further recommendations according to Ucx.  Other possible etiologies discussed. ? Vulvovaginitis. OTC Monistat may help. Further recommendations according to lab results.  Dizziness ? Presyncope. Adequate hydration. Instructed about warning signs. Further recommendations according to lab results.   Return in about 4 months (around 10/08/2020) for fasting.   Aunika Kirsten G. Martinique, MD  [facility]. [place] office.   A few things to remember from today's visit:  Hyperlipidemia, unspecified hyperlipidemia type  Sensation of chest pressure - Plan: EKG 12-Lead, CBC, BASIC METABOLIC PANEL WITH GFR  SOB (shortness of breath) - Plan: EKG 12-Lead, CBC, BASIC METABOLIC PANEL WITH GFR  Dysuria - Plan: Urinalysis, Routine w reflex microscopic, Culture, Urine  If you need refills please call your pharmacy. Do not use My Chart to request refills or for acute issues that need immediate attention.    Please be sure medication list is accurate. If a new problem present, please set up appointment sooner than planned today.  Usted debera estar tomando medicamento pare el colesterol, Lovastatin 40 mg. Hoy EKG mostro que el marcapasos esta funcionando, no veo come infartos. Pero si el dolor le empeora or se siente sudorosa con dificultad para respirar llama al 911. Tiene una cita con el cardilogo el [address] [place]. Descanse en la casa.  Pare el trazodone y siga con medicamento que esta usando para dormir.

## 2020-06-08 LAB — CBC
HCT: 44.3 % (ref 35.0–45.0)
Hemoglobin: 14.9 g/dL (ref 11.7–15.5)
MCH: 31.1 pg (ref 27.0–33.0)
MCHC: 33.6 g/dL (ref 32.0–36.0)
MCV: 92.5 fL (ref 80.0–100.0)
MPV: 10.3 fL (ref 7.5–12.5)
Platelets: 272 10*3/uL (ref 140–400)
RBC: 4.79 10*6/uL (ref 3.80–5.10)
RDW: 12.5 % (ref 11.0–15.0)
WBC: 8.8 10*3/uL (ref 3.8–10.8)

## 2020-06-08 LAB — BASIC METABOLIC PANEL WITH GFR
BUN: 18 mg/dL (ref 7–25)
CO2: 31 mmol/L (ref 20–32)
Calcium: 9.8 mg/dL (ref 8.6–10.4)
Chloride: 102 mmol/L (ref 98–110)
Creat: 0.78 mg/dL (ref 0.50–0.99)
GFR, Est African American: 92 mL/min/{1.73_m2} (ref >=60)
GFR, Est Non African American: 79 mL/min/{1.73_m2} (ref >=60)
Glucose, Bld: 101 mg/dL — ABNORMAL HIGH (ref 65–99)
Potassium: 4 mmol/L (ref 3.5–5.3)
Sodium: 139 mmol/L (ref 135–146)

## 2020-06-08 MED ORDER — SULFAMETHOXAZOLE-TRIMETHOPRIM 800-160 MG PO TABS: 1.0000 | ORAL_TABLET | Freq: Two times a day (BID) | ORAL | 0 refills | Status: AC

## 2020-06-10 ENCOUNTER — Telehealth: Payer: Self-pay | Admitting: Family Medicine

## 2020-06-10 LAB — URINALYSIS, ROUTINE W REFLEX MICROSCOPIC
Bilirubin Urine: NEGATIVE
Glucose, UA: NEGATIVE
Hgb urine dipstick: NEGATIVE
Hyaline Cast: NONE SEEN /LPF
Ketones, ur: NEGATIVE
Nitrite: NEGATIVE
Protein, ur: NEGATIVE
RBC / HPF: NONE SEEN /HPF (ref 0–2)
Specific Gravity, Urine: 1.007 (ref 1.001–1.03)
Squamous Epithelial / HPF: NONE SEEN /HPF (ref <=5)
pH: 6 (ref 5.0–8.0)

## 2020-06-10 LAB — URINE CULTURE
MICRO NUMBER:: 11027907
SPECIMEN QUALITY:: ADEQUATE

## 2020-06-10 NOTE — Telephone Encounter (Signed)
Pt called to get lab results °

## 2020-06-14 ENCOUNTER — Ambulatory Visit (INDEPENDENT_AMBULATORY_CARE_PROVIDER_SITE_OTHER): Payer: Medicare Other

## 2020-06-14 DIAGNOSIS — I441 Atrioventricular block, second degree: Secondary | ICD-10-CM | POA: Diagnosis not present

## 2020-06-14 LAB — CUP PACEART REMOTE DEVICE CHECK
Battery Remaining Longevity: 112 mo
Battery Voltage: 3.17 V
Brady Statistic AP VP Percent: 30.39 %
Brady Statistic AP VS Percent: 0.21 %
Brady Statistic AS VP Percent: 69.01 %
Brady Statistic AS VS Percent: 0.39 %
Brady Statistic RA Percent Paced: 30.59 %
Brady Statistic RV Percent Paced: 99.4 %
Date Time Interrogation Session: 20211010234253
Implantable Lead Implant Date: 20210711
Implantable Lead Implant Date: 20210711
Implantable Lead Location: 753859
Implantable Lead Location: 753860
Implantable Lead Model: 5076
Implantable Lead Model: 5076
Implantable Pulse Generator Implant Date: 20210711
Lead Channel Impedance Value: 342 Ohm
Lead Channel Impedance Value: 437 Ohm
Lead Channel Impedance Value: 513 Ohm
Lead Channel Impedance Value: 608 Ohm
Lead Channel Pacing Threshold Amplitude: 0.625 V
Lead Channel Pacing Threshold Amplitude: 1 V
Lead Channel Pacing Threshold Pulse Width: 0.4 ms
Lead Channel Pacing Threshold Pulse Width: 0.4 ms
Lead Channel Sensing Intrinsic Amplitude: 4 mV
Lead Channel Sensing Intrinsic Amplitude: 4 mV
Lead Channel Sensing Intrinsic Amplitude: 6.25 mV
Lead Channel Sensing Intrinsic Amplitude: 6.25 mV
Lead Channel Setting Pacing Amplitude: 3.25 V
Lead Channel Setting Pacing Amplitude: 3.25 V
Lead Channel Setting Pacing Pulse Width: 0.4 ms
Lead Channel Setting Sensing Sensitivity: 1.2 mV

## 2020-06-14 NOTE — Telephone Encounter (Signed)
See result notes. I left Tiffany Berg a voicemail to call me back.

## 2020-06-16 NOTE — Progress Notes (Signed)
Remote pacemaker transmission.   

## 2020-06-29 ENCOUNTER — Ambulatory Visit (INDEPENDENT_AMBULATORY_CARE_PROVIDER_SITE_OTHER): Payer: Medicare Other | Admitting: Cardiology

## 2020-06-29 ENCOUNTER — Encounter: Payer: Self-pay | Admitting: Cardiology

## 2020-06-29 ENCOUNTER — Encounter: Payer: Medicare Other | Admitting: Cardiology

## 2020-06-29 ENCOUNTER — Other Ambulatory Visit: Payer: Self-pay

## 2020-06-29 VITALS — BP 134/76 | HR 77 | Ht 62.0 in | Wt 158.0 lb

## 2020-06-29 DIAGNOSIS — I441 Atrioventricular block, second degree: Secondary | ICD-10-CM | POA: Diagnosis not present

## 2020-06-29 NOTE — Progress Notes (Signed)
Electrophysiology Office Note   Date:  06/29/2020   ID:  Tiffany, Berg 12-06-1955, MRN [id number]  PCP:  Tiffany, Betty G, MD  Cardiologist:   Primary Electrophysiologist:  Tiffany Berg Tiffany Leeds, MD    Chief Complaint: pacemaker   History of Present Illness: Tiffany Berg is a 64 y.o. female who is being seen today for the evaluation of pacemaker at the request of Tiffany, Tiffany So, MD. Presenting today for electrophysiology evaluation.  She has a history of hyperlipidemia, hypothyroidism, breast cancer, and depression.  She presented to the hospital 03/12/2020 from her PCPs office due to bradycardia.  She developed dyspnea on exertion, presyncope and some chest pain with exertion.  Her heart rate was found to be in the 30s.  She is now status post Medtronic dual-chamber pacemaker implanted 03/14/2020.  Today, she denies symptoms of palpitations, chest pain, shortness of breath, orthopnea, PND, lower extremity edema, claudication, dizziness, presyncope, syncope, bleeding, or neurologic sequela. The patient is tolerating medications without difficulties.  Since her pacemaker was implanted she has done well.  She has no chest pain or shortness of breath.  She is able do all of her daily activities without restriction.  She feels much better now that she has healed up from the device and has no complaints.   Past Medical History:  Diagnosis Date  . Cancer ([place])    left breast  . Depression    undiagnosed- pt feels like crying sometimes for no reason  . Headache   . Hyperlipidemia   . Hypothyroidism   . Mobitz II 03/12/2020  . Symptomatic bradycardia 03/12/2020   Past Surgical History:  Procedure Laterality Date  . ANTERIOR AND POSTERIOR REPAIR N/A 02/18/2015   Procedure: ANTERIOR (CYSTOCELE) AND POSTERIOR REPAIR (RECTOCELE);  Surgeon: Everett Graff, MD;  Location: [facility];  Service: Gynecology;  Laterality: N/A;  . BLADDER SUSPENSION N/A 02/18/2015   Procedure: TRANSVAGINAL TAPE (TVT)  PROCEDURE;  Surgeon: Everett Graff, MD;  Location: [facility];  Service: Gynecology;  Laterality: N/A;  . BREAST SURGERY    . CYSTOSCOPY Bilateral 02/18/2015   Procedure: CYSTOSCOPY;  Surgeon: Everett Graff, MD;  Location: [facility];  Service: Gynecology;  Laterality: Bilateral;  . LAPAROSCOPIC ASSISTED VAGINAL HYSTERECTOMY N/A 02/18/2015   Procedure: LAPAROSCOPIC ASSISTED VAGINAL HYSTERECTOMY;  Surgeon: Everett Graff, MD;  Location: [facility];  Service: Gynecology;  Laterality: N/A;  . PACEMAKER IMPLANT N/A 03/14/2020   Procedure: PACEMAKER IMPLANT;  Surgeon: Tiffany Haw, MD;  Location: [facility];  Service: Cardiovascular;  Laterality: N/A;  . SALPINGOOPHORECTOMY Bilateral 02/18/2015   Procedure: BILATERAL SALPINGO OOPHORECTOMY;  Surgeon: Everett Graff, MD;  Location: [facility];  Service: Gynecology;  Laterality: Bilateral;     Current Outpatient Medications  Medication Sig Dispense Refill  . aspirin EC 81 MG tablet Take 81 mg by mouth daily.    . celecoxib (CELEBREX) 200 MG capsule Take 1 capsule (200 mg total) by mouth 2 (two) times daily as needed. 60 capsule 6  . gabapentin (NEURONTIN) 100 MG capsule Take 2 capsules (200 mg total) by mouth at bedtime. 60 capsule 3  . levothyroxine (SYNTHROID) 75 MCG tablet Take 1 tablet (75 mcg total) by mouth daily before breakfast. 90 tablet 3  . lovastatin (MEVACOR) 40 MG tablet Take 1 tablet (40 mg total) by mouth at bedtime. 90 tablet 1  . tiZANidine (ZANAFLEX) 2 MG tablet Take 1-2 tablets (2-4 mg total) by mouth every 6 (six) hours as needed for muscle spasms. 60 tablet 1  No current facility-administered medications for this visit.    Allergies:   Patient has no allergy information on record.   Social History:  The patient  reports that she has never smoked. She has never used smokeless tobacco. She reports current alcohol use. She reports that she does not use drugs.   Family History:  The patient's family history includes Cancer in  her father; Cancer (age of onset: 34) in her sister; Heart disease in her mother.    ROS:  Please see the history of present illness.   Otherwise, review of systems is positive for none.   All other systems are reviewed and negative.    PHYSICAL EXAM: VS:  BP 134/76   Pulse 77   Ht 5\' 2"  (1.575 m)   Wt 158 lb (71.7 kg)   SpO2 97%   BMI 28.90 kg/m  , BMI Body mass index is 28.9 kg/m. GEN: Well nourished, well developed, in no acute distress  HEENT: normal  Neck: no JVD, carotid bruits, or masses Cardiac: RRR; no murmurs, rubs, or gallops,no edema  Respiratory:  clear to auscultation bilaterally, normal work of breathing GI: soft, nontender, nondistended, + BS MS: no deformity or atrophy  Skin: warm and dry, device pocket is well healed Neuro:  Strength and sensation are intact Psych: euthymic mood, full affect  EKG:  EKG is ordered today. Personal review of the ekg ordered shows sinus rhythm, ventricular paced, rate 77  Device interrogation is reviewed today in detail.  See PaceArt for details.   Recent Labs: 03/12/2020: ALT 16; Magnesium 2.2; TSH 0.669 06/07/2020: BUN 18; Creat 0.78; Hemoglobin 14.9; Platelets 272; Potassium 4.0; Sodium 139    Lipid Panel     Component Value Date/Time   CHOL 242 (H) 03/13/2020 0245   TRIG 211 (H) 03/13/2020 0245   HDL 53 03/13/2020 0245   CHOLHDL 4.6 03/13/2020 0245   VLDL 42 (H) 03/13/2020 0245   LDLCALC 147 (H) 03/13/2020 0245     Wt Readings from Last 3 Encounters:  06/29/20 158 lb (71.7 kg)  06/07/20 155 lb (70.3 kg)  03/15/20 149 lb 12.8 oz (67.9 kg)      Other studies Reviewed: Additional studies/ records that were reviewed today include: TTE 03/13/2020 Review of the above records today demonstrates:  1. Left ventricular ejection fraction, by estimation, is 60 to 65%. The  left ventricle has normal function. The left ventricle has no regional  wall motion abnormalities. There is mild left ventricular hypertrophy.    heart block indeterminate.  2. Right ventricular systolic function is normal. The right ventricular  size is normal.  3. The mitral valve is normal in structure. Trivial mitral valve  regurgitation. No evidence of mitral stenosis.  4. The aortic valve is normal in structure. Aortic valve regurgitation is  not visualized. Mild aortic valve sclerosis is present, with no evidence  of aortic valve stenosis.  5. The inferior vena cava is normal in size with greater than 50%  respiratory variability, suggesting right atrial pressure of 3 mmHg.    ASSESSMENT AND PLAN:  1.  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  Device functioning appropriately.  She is.  No changes at this time.  2.  Hyperlipidemia: Continue lovastatin per primary care.  Current medicines are reviewed at length with the patient today.   The patient does not have concerns regarding her medicines.  The following changes were made today:  none  Labs/ tests ordered today  include:  Orders Placed This Encounter  Procedures  . EKG 12-Lead     Disposition:   FU with Ezrie Bunyan 9 months  Signed, Vona Whiters Tiffany Leeds, MD  06/29/2020 10:21 AM     [facility] [address] [place] [address] [phone] (office) [phone] (fax)

## 2020-07-15 ENCOUNTER — Telehealth: Payer: Self-pay | Admitting: Family Medicine

## 2020-07-15 NOTE — Telephone Encounter (Signed)
The patient is wanting an in office appointment tomorrow and only wants to see Dr. Martinique.   She is not feeling good. She is having bone pains from the waist down, dizzy ness, and nausea.  She doesn't want to do a virtual visit can we work this patient in tomorrow 11/12?

## 2020-07-16 ENCOUNTER — Ambulatory Visit: Payer: Medicare Other | Admitting: Family Medicine

## 2020-07-16 NOTE — Telephone Encounter (Signed)
12/30/

## 2020-07-19 ENCOUNTER — Encounter: Payer: Self-pay | Admitting: Family Medicine

## 2020-07-19 ENCOUNTER — Other Ambulatory Visit: Payer: Self-pay

## 2020-07-19 ENCOUNTER — Ambulatory Visit (INDEPENDENT_AMBULATORY_CARE_PROVIDER_SITE_OTHER): Payer: Medicare Other | Admitting: Family Medicine

## 2020-07-19 VITALS — BP 140/90 | HR 81 | Temp 97.8°F | Resp 16 | Ht 62.0 in | Wt 158.2 lb

## 2020-07-19 DIAGNOSIS — M79672 Pain in left foot: Secondary | ICD-10-CM | POA: Diagnosis not present

## 2020-07-19 DIAGNOSIS — J069 Acute upper respiratory infection, unspecified: Secondary | ICD-10-CM

## 2020-07-19 DIAGNOSIS — M79671 Pain in right foot: Secondary | ICD-10-CM | POA: Diagnosis not present

## 2020-07-19 NOTE — Progress Notes (Signed)
Chief Complaint  Patient presents with  . URI   HPI: [name] is a 64 y.o. female, who is here today complaining of a day of fatigue, clear rhinorrhea, sore throat,chills, subjective fever. Body aches. She has completed COVID 19 vaccination.  Her grandchildren have been sick.  She has not had cough,SOB,wheezing,or CP. Negative for anosmia and ageusia.  She has taken Tylenol x 1.  Negative for abdominal pain,N/V,diarrhea,or skin rash.  She is c/o feet pain, she has already seen podiatrist. Feet pain is affecting her sleep. No hx of trauma. Negative for joint edema and erythema. Bunions surgery was recommended, she is afraid of having complications from surgery. She is not taking OTC medications for pain.  Review of Systems  Constitutional: Positive for fatigue. Negative for appetite change.  HENT: Positive for congestion. Negative for ear pain, mouth sores, sinus pressure, sneezing and voice change.   Eyes: Negative for discharge, redness and itching.  Genitourinary: Negative for decreased urine volume, dysuria and hematuria.  Allergic/Immunologic: Negative for environmental allergies.  Neurological: Negative for weakness and headaches.  Hematological: Negative for adenopathy. Does not bruise/bleed easily.  Psychiatric/Behavioral: Negative for confusion.  Rest see pertinent positives and negatives per HPI.  Current Outpatient Medications on File Prior to Visit  Medication Sig Dispense Refill  . aspirin EC 81 MG tablet Take 81 mg by mouth daily.    . celecoxib (CELEBREX) 200 MG capsule Take 1 capsule (200 mg total) by mouth 2 (two) times daily as needed. 60 capsule 6  . gabapentin (NEURONTIN) 100 MG capsule Take 2 capsules (200 mg total) by mouth at bedtime. 60 capsule 3  . levothyroxine (SYNTHROID) 75 MCG tablet Take 1 tablet (75 mcg total) by mouth daily before breakfast. 90 tablet 3  . lovastatin (MEVACOR) 40 MG tablet Take 1 tablet (40 mg total) by mouth at  bedtime. 90 tablet 1  . tiZANidine (ZANAFLEX) 2 MG tablet Take 1-2 tablets (2-4 mg total) by mouth every 6 (six) hours as needed for muscle spasms. 60 tablet 1   No current facility-administered medications on file prior to visit.     Past Medical History:  Diagnosis Date  . Cancer ([place])    left breast  . Depression    undiagnosed- pt feels like crying sometimes for no reason  . Headache   . Hyperlipidemia   . Hypothyroidism   . Mobitz II 03/12/2020  . Symptomatic bradycardia 03/12/2020   Not on File  Social History   Socioeconomic History  . Marital status: Legally Separated    Spouse name: Not on file  . Number of children: Not on file  . Years of education: Not on file  . Highest education level: Not on file  Occupational History  . Not on file  Tobacco Use  . Smoking status: Never Smoker  . Smokeless tobacco: Never Used  Substance and Sexual Activity  . Alcohol use: Yes    Comment: socially  . Drug use: No  . Sexual activity: Yes    Birth control/protection: Post-menopausal, Surgical  Other Topics Concern  . Not on file  Social History Narrative  . Not on file   Social Determinants of Health   Financial Resource Strain:   . Difficulty of Paying Living Expenses: Not on file  Food Insecurity:   . Worried About Charity fundraiser in the Last Year: Not on file  . Ran Out of Food in the Last Year: Not on file  Transportation Needs:   .  Lack of Transportation (Medical): Not on file  . Lack of Transportation (Non-Medical): Not on file  Physical Activity:   . Days of Exercise per Week: Not on file  . Minutes of Exercise per Session: Not on file  Stress:   . Feeling of Stress : Not on file  Social Connections:   . Frequency of Communication with Friends and Family: Not on file  . Frequency of Social Gatherings with Friends and Family: Not on file  . Attends Religious Services: Not on file  . Active Member of Clubs or Organizations: Not on file  . Attends English as a second language teacher Meetings: Not on file  . Marital Status: Not on file    Vitals:   07/19/20 1401  BP: 140/90  Pulse: 81  Resp: 16  Temp: 97.8 F (36.6 C)  SpO2: 95%   Body mass index is 28.94 kg/m.  Physical Exam Vitals and nursing note reviewed.  Constitutional:      General: She is not in acute distress.    Appearance: She is well-developed. She is not ill-appearing.  HENT:     Head: Normocephalic and atraumatic.     Right Ear: Tympanic membrane, ear canal and external ear normal.     Left Ear: Tympanic membrane, ear canal and external ear normal.     Nose: Rhinorrhea present.     Right Sinus: No maxillary sinus tenderness or frontal sinus tenderness.     Left Sinus: No maxillary sinus tenderness or frontal sinus tenderness.     Mouth/Throat:     Mouth: Mucous membranes are moist.     Pharynx: Oropharynx is clear. Uvula midline. No posterior oropharyngeal erythema.  Eyes:     Conjunctiva/sclera: Conjunctivae normal.  Cardiovascular:     Rate and Rhythm: Normal rate and regular rhythm.     Heart sounds: No murmur heard.   Pulmonary:     Effort: Pulmonary effort is normal. No respiratory distress.     Breath sounds: Normal breath sounds. No stridor.  Musculoskeletal:     Comments: Hammer toes 2nd toe and bilateral  Pronounce hallux valgo, L>R  Lymphadenopathy:     Head:     Right side of head: No submandibular adenopathy.     Left side of head: No submandibular adenopathy.     Cervical: No cervical adenopathy.  Skin:    General: Skin is warm.     Findings: No erythema or rash.  Neurological:     Mental Status: She is alert and oriented to person, place, and time.  Psychiatric:        Speech: Speech normal.     Comments: Well groomed, good eye contact.    ASSESSMENT AND PLAN:  [name] was seen today for uri.  Diagnoses and all orders for this visit:  URI, acute Symptoms suggests a viral etiology. Symptomatic treatment recommended. Nasal swab  collected for COVID 19 and flu test. Quarantine recommended. Instructed to monitor for new symptoms and signs of complications. Clearly instructed about warning signs. F/U as needed.  -     COVID-19, Flu A+B and RSV  Pain in both feet Caused by hammer toes and bunions. Explained that foot surgery is usually safe,can be performed with intravenous regional anesthesia. Based on risk vs benefit, it seems like she will benefit from procedure given the level of pain. We discussed treat,ment options. Because heart disease, I do not recommend NSAID's. Tylenol pm at bedtime recommended. Tramadol can be considered if needed.  Return if symptoms worsen  or fail to improve.   Maudene Stotler G. [place], MD  [facility]. [place] office.   A few things to remember from today's visit:  URI, acute  Bunion of great toe  Hammer toes of both feet  Por ahora este lejos de todos [employer] laboratorios. Liquidos. Otors simtomas pueden Mudlogger. Tylenol PM puede ayudar con el dolor de los pies.  Infeccin de las vas respiratorias superiores, en adultos Upper Respiratory Infection, Adult Una infeccin de las vas respiratorias superiores (IVRS) afecta la nariz, la garganta y las vas respiratorias superiores. Las IVRS son causadas por microbios (virus). El tipo ms comn de IVRS es el resfro comn. Las IVRS no se curan con medicamentos, pero hay ciertas cosas que puede hacer en su casa para aliviar los sntomas. Una IVRS suele mejorar en el transcurso de 7 a 10 das. Siga estas indicaciones en su casa: Actividad  Descanse todo lo que sea necesario.  Si tiene fiebre, [employer], sin ir al [employer] o a la escuela, hasta que ya no tenga fiebre, o hasta que el mdico le indique que puede regresar al [employer] o a la escuela. ? Futures trader en su casa hasta que ya no pueda propagar (contagiar) la infeccin. ? Es posible que el [employer] indique que use  una mascarilla para tener menos riesgo de propagar la infeccin. Para aliviar los sntomas  [place] grgaras con una mezcla de agua y sal 3 o 4veces al da, o cuando sea necesario. Para preparar la mezcla de agua y sal, disuelva totalmente de media a 1cucharadita de sal en 1taza de agua tibia.  Use un humidificador de aire fro para agregar humedad al aire. Esto puede ayudarlo a que respire mejor. Qu debe comer y beber   Beba suficiente lquido para mantener la orina de color amarillo plido.  Tome sopas y caldos transparentes. Instrucciones generales   [employer] de venta libre y los recetados solamente como se lo haya indicado el mdico. Estos incluyen medicamentos para el resfro, para bajar la fiebre y antitusivos.  No consuma ningn producto que contenga nicotina o tabaco. Esto incluye cigarrillos y cigarrillos electrnicos. Si necesita ayuda para dejar de fumar, consulte al mdico.  Evite estar cerca de personas que fuman (evite el humo ambiental de tabaco).  Asegrese de vacunarse regularmente y recibir la vacuna contra la gripe todos los [place].  Concurra a todas las visitas de seguimiento como se lo haya indicado el mdico. Esto es importante. Cmo evitar la propagacin de la infeccin a Corporate investment banker las manos frecuentemente con agua y [place]. Use un desinfectante para manos si no dispone de [place] y [place].  Evite tocarse la boca, la cara, los ojos o la [place].  Tosa o estornude en un pauelo de papel o sobre su manga o codo. No tosa o estornude al aire ni se cubra la boca o la nariz con la [place]. Comunquese con un mdico si:  Siente que empeora o que no mejora.  Tiene alguno de estos sntomas: ? Cristy Hilts. ? Escalofros. ? Mucosidad color marrn o roja en la nariz. ? Lquido amarillento o amarronado (Psychiatric nurse de la Lawyer. ? Dolor en la cara, especialmente al inclinarse hacia adelante. ? Ganglios del cuello inflamados. ? Dolor al  tragar. ? Zonas blancas en la parte de atrs de la garganta. Solicite ayuda de inmediato si:  La falta de aire empeora.  Los siguientes sntomas son Orlene Erm intensos  o constantes: ? Dolor de [place]. ? Dolor de odo. ? Dolor en la frente, detrs de los ojos y por encima de los pmulos (dolor sinusal). ? Tourist information centre manager.  Tiene una enfermedad pulmonar prolongada (crnica) junto con cualquiera de estos sntomas: ? Sibilancias. ? Tos prolongada. ? Tos con Carma Lair. ? Cambio en la mucosidad habitual.  Presenta rigidez en el cuello.  Tiene cambios en: ? La visin. ? La audicin. ? El razonamiento. ? El [place] de nimo. Resumen  Una infeccin de las vas respiratorias superiores (IVRS) es causada por un microbio llamado virus. El tipo ms comn de IVRS es el resfro comn.  Una IVRS suele mejorar en el transcurso de 7 a 10 das.  Tome los medicamentos de venta libre y los recetados solamente como se lo haya indicado el mdico. Esta informacin no tiene Marine scientist el consejo del mdico. Asegrese de hacerle al mdico cualquier pregunta que tenga. Document Revised: 06/22/2017 Document Reviewed: 06/22/2017 Elsevier Patient Education  [place].  Please be sure medication list is accurate. If a new problem present, please set up appointment sooner than planned today.

## 2020-07-19 NOTE — Patient Instructions (Addendum)
A few things to remember from today's visit:  URI, acute  Bunion of great toe  Hammer toes of both feet  Por ahora este lejos de todos [employer] laboratorios. Liquidos. Otors simtomas pueden Mudlogger. Tylenol PM puede ayudar con el dolor de los pies.  Infeccin de las vas respiratorias superiores, en adultos Upper Respiratory Infection, Adult Una infeccin de las vas respiratorias superiores (IVRS) afecta la nariz, la garganta y las vas respiratorias superiores. Las IVRS son causadas por microbios (virus). El tipo ms comn de IVRS es el resfro comn. Las IVRS no se curan con medicamentos, pero hay ciertas cosas que puede hacer en su casa para aliviar los sntomas. Una IVRS suele mejorar en el transcurso de 7 a 10 das. Siga estas indicaciones en su casa: Actividad  Descanse todo lo que sea necesario.  Si tiene fiebre, [employer], sin ir al [employer] Carne o a la escuela, hasta que ya no tenga fiebre, o hasta que el mdico le indique que puede regresar al [employer]ne o a la escuela. ? Futures trader en su casa hasta que ya no pueda propagar (contagiar) la infeccin. ? Es posible que el [employer] indique que use una mascarilla para tener menos riesgo de propagar la infeccin. Para aliviar los sntomas  [place] grgaras con una mezcla de agua y sal 3 o 4veces al da, o cuando sea necesario. Para preparar la mezcla de agua y sal, disuelva totalmente de media a 1cucharadita de sal en 1taza de agua tibia.  Use un humidificador de aire fro para agregar humedad al aire. Esto puede ayudarlo a que respire mejor. Qu debe comer y beber   Beba suficiente lquido para mantener la orina de color amarillo plido.  Tome sopas y caldos transparentes. Instrucciones generales   [employer] de venta libre y los recetados solamente como se lo haya indicado el mdico. Estos incluyen medicamentos para el resfro, para bajar la fiebre y antitusivos.  No  consuma ningn producto que contenga nicotina o tabaco. Esto incluye cigarrillos y cigarrillos electrnicos. Si necesita ayuda para dejar de fumar, consulte al mdico.  Evite estar cerca de personas que fuman (evite el humo ambiental de tabaco).  Asegrese de vacunarse regularmente y recibir la vacuna contra la gripe todos los [place].  Concurra a todas las visitas de seguimiento como se lo haya indicado el mdico. Esto es importante. Cmo evitar la propagacin de la infeccin a Corporate investment banker las manos frecuentemente con agua y [place]. Use un desinfectante para manos si no dispone de [place] y [place].  Evite tocarse la boca, la cara, los ojos o la [place].  Tosa o estornude en un pauelo de papel o sobre su manga o codo. No tosa o estornude al aire ni se cubra la boca o la nariz con la [place]. Comunquese con un mdico si:  Siente que empeora o que no mejora.  Tiene alguno de estos sntomas: ? Cristy Hilts. ? Escalofros. ? Mucosidad color marrn o roja en la nariz. ? Lquido amarillento o amarronado (Psychiatric nurse de la Lawyer. ? Dolor en la cara, especialmente al inclinarse hacia adelante. ? Ganglios del cuello inflamados. ? Dolor al tragar. ? Zonas blancas en la parte de atrs de la garganta. Solicite ayuda de inmediato si:  La falta de aire empeora.  Los siguientes sntomas son muy intensos o constantes: ? Dolor de [place]. ? Dolor de odo. ? Dolor en la frente, detrs de los ojos  y por encima de los pmulos (dolor sinusal). ? Tourist information centre manager.  Tiene una enfermedad pulmonar prolongada (crnica) junto con cualquiera de estos sntomas: ? Sibilancias. ? Tos prolongada. ? Tos con Carma Lair. ? Cambio en la mucosidad habitual.  Presenta rigidez en el cuello.  Tiene cambios en: ? La visin. ? La audicin. ? El razonamiento. ? El [place] de nimo. Resumen  Una infeccin de las vas respiratorias superiores (IVRS) es causada por un microbio llamado virus. El tipo ms comn  de IVRS es el resfro comn.  Una IVRS suele mejorar en el transcurso de 7 a 10 das.  Tome los medicamentos de venta libre y los recetados solamente como se lo haya indicado el mdico. Esta informacin no tiene Marine scientist el consejo del mdico. Asegrese de hacerle al mdico cualquier pregunta que tenga. Document Revised: 06/22/2017 Document Reviewed: 06/22/2017 Elsevier Patient Education  [place].  Please be sure medication list is accurate. If a new problem present, please set up appointment sooner than planned today.

## 2020-07-20 LAB — COVID-19, FLU A+B AND RSV
Influenza A, NAA: NOT DETECTED
Influenza B, NAA: NOT DETECTED
RSV, NAA: NOT DETECTED
SARS-CoV-2, NAA: NOT DETECTED

## 2020-08-18 ENCOUNTER — Telehealth: Payer: Self-pay | Admitting: Family Medicine

## 2020-08-18 NOTE — Telephone Encounter (Signed)
Left message for patient to call back and schedule Medicare Annual Wellness Visit (AWVI) either virtually or in office.   Last AWV no information please schedule at anytime with [facility] Nurse Health Advisor 1 or 2   This should be a 45 minute visit.

## 2020-08-19 ENCOUNTER — Other Ambulatory Visit: Payer: Self-pay

## 2020-08-19 ENCOUNTER — Ambulatory Visit (INDEPENDENT_AMBULATORY_CARE_PROVIDER_SITE_OTHER): Payer: Medicare Other | Admitting: Podiatry

## 2020-08-19 ENCOUNTER — Encounter: Payer: Self-pay | Admitting: Podiatry

## 2020-08-19 DIAGNOSIS — M2042 Other hammer toe(s) (acquired), left foot: Secondary | ICD-10-CM | POA: Diagnosis not present

## 2020-08-19 DIAGNOSIS — M2011 Hallux valgus (acquired), right foot: Secondary | ICD-10-CM

## 2020-08-19 DIAGNOSIS — M21611 Bunion of right foot: Secondary | ICD-10-CM

## 2020-08-19 DIAGNOSIS — M21612 Bunion of left foot: Secondary | ICD-10-CM

## 2020-08-19 DIAGNOSIS — M2041 Other hammer toe(s) (acquired), right foot: Secondary | ICD-10-CM | POA: Diagnosis not present

## 2020-08-19 DIAGNOSIS — M2012 Hallux valgus (acquired), left foot: Secondary | ICD-10-CM

## 2020-08-19 NOTE — Progress Notes (Signed)
  Subjective:  Patient ID: Tiffany Berg, female    DOB: 1956/03/15,  MRN: [id number]  Plan bunion surgery and second toe surgery on both feet  64 y.o. female returns with the above complaint. History confirmed with patient.  Overall her symptoms are similar to the previous visit. She states that her foot pain is symmetric and bilateral and her primary complaints are second toe pain or a painful calluses present over the hammertoe, and pain over the bump when wearing shoes on her bunions.  They both hurt equally.  She does not have pain moving the joint without shoes on.  She notes no crepitus or crunching feelings within the joint.  She would like to start with the right foot first.  She has been cleared to proceed with surgery by her cardiologist  Translation is provided by video conference with a translator from [place] to [place].  Objective:  Physical Exam: warm, good capillary refill, no trophic changes or ulcerative lesions, normal DP and PT pulses and normal sensory exam.  Bilateral foot exam: Severe hallux valgus present bilaterally with large dorsal medial bunion which is red and tender to palpation.  Range of motion the first metatarsophalangeal joint is maintained without crepitance.  No joint mice or spurring about the joint are palpable.  Both first metatarsophalangeal joints are mildly track bound but I can mostly reduce them into position.  Semirigid hammertoes 2 through 5 with adductovarus of the fifth toe and dorsal lateral corns at the PIPJ here.  She has a large dorsal corn bilaterally on the second PIPJ of the second toe.  Second toe is the worst of these and is the only painful digits for her.      Radiographs: X-ray of both feet taken on 03/12/2020: Bilateral severe hallux valgus with digital contractures noted.  Moderate metatarsus adductus deformity noted.  First metatarsophalangeal joint has mild narrowing laterally, however this can be positional and overall the joint  space is preserved. Assessment:   No diagnosis found.   Plan:  Patient was evaluated and treated and all questions answered.  -Discussed the etiology and treatment including surgical and non surgical treatment for painful bunions and hammertoes.  She has exhausted all non surgical treatment prior to this visit including shoe gear changes and padding.  She desires surgical intervention. We discussed all risks including but not limited to: pain, swelling, infection, scar, numbness which may be temporary or permanent, chronic pain, stiffness, nerve pain or damage, wound healing problems, bone healing problems including delayed or non-union and recurrence. Specifically we discussed the following procedures: Bilateral Lapidus bunionectomy with possible Akin osteotomy, autograft from calcaneus and second hammertoe correction likely consisting of PIPJ arthrodesis and associated soft tissue correction and capsular tendon balancing.  We discussed that this would be a staged procedure and we will treat the right foot first followed by the left foot. We discussed the postoperative course including prolonged non-weightbearing in a splint and/or fracture boot for 2-3 weeks followed by weightbearing in the boot for another 4 to 6 weeks pending radiographic union.  I discussed this with her cardiologist Dr. Curt Bears and he was agreed that she would be safe from surgery from his cardiac standpoint.  We will plan for surgery in February 2022  No follow-ups on file.    Lanae Crumbly, DPM 08/19/2020

## 2020-09-01 ENCOUNTER — Encounter: Payer: Self-pay | Admitting: Family Medicine

## 2020-09-01 ENCOUNTER — Telehealth (INDEPENDENT_AMBULATORY_CARE_PROVIDER_SITE_OTHER): Payer: Medicare Other | Admitting: Family Medicine

## 2020-09-01 VITALS — Ht 62.0 in

## 2020-09-01 DIAGNOSIS — J069 Acute upper respiratory infection, unspecified: Secondary | ICD-10-CM | POA: Diagnosis not present

## 2020-09-01 DIAGNOSIS — J029 Acute pharyngitis, unspecified: Secondary | ICD-10-CM | POA: Diagnosis not present

## 2020-09-01 LAB — POCT RAPID STREP A (OFFICE): Rapid Strep A Screen: NEGATIVE

## 2020-09-01 NOTE — Progress Notes (Signed)
Virtual Visit via Telephone Note  I connected with Tiffany Berg on 09/01/20 at  8:30 AM EST by telephone and verified that I am speaking with the correct person using two identifiers.   I discussed the limitations, risks, security and privacy concerns of performing an evaluation and management service by telephone and the availability of in person appointments. I also discussed with the patient that there may be a patient responsible charge related to this service. The patient expressed understanding and agreed to proceed.  Location patient: home Location provider: work office Participants present for the call: patient, provider Patient did not have a visit in the prior 7 days to address this/these issue(s).  Chief Complaint  Patient presents with  . 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  She is concerned about strep infection. She has noted pharyngeal erythema and post nasal drainage. + Mild dysphonia. Fatigue and body aches until yesterday.  Rhinorrhea and nasal congestion, improved. Negative for anosmia and ageusia. Subjective fever and chills. Temp last night "90 something."  Negative for abdominal pain,N/V,changes in bowel habits,or skin rash. No sick contact. COVID vaccine x 2.  She has taken Tylenol. Today she feels better.  Observations/Objective: Patient sounds cheerful and well on the phone. I do not appreciate any SOB. Mild dysphonia and nasal congestion. Speech and thought processing are grossly intact. Patient reported vitals:Ht 5\' 2"  (1.575 m)   BMI 28.94 kg/m   Assessment and Plan:  1. Sore throat Came to the parking lot for me to collect sample for rapid strep, negative.  Minimal pharyngeal erythema and post nasal drainage. We do not have strep culture swabs but inspection and hx do not suggest strep  infection. Gargle with saline and throat lozenges may help.  - POC Rapid Strep A  2. URI, acute Symptoms suggests a viral etiology, so symptomatic treatment is usually recommended. Instructed to monitor for signs of complications, including new onset of fever among some, clearly instructed about warning signs. F/U as needed.  Follow Up Instructions:  Return if symptoms worsen or fail to improve.  I did not refer this patient for an OV in the next 24 hours for this/these issue(s).  I discussed the assessment and treatment plan with the patient. She was provided an opportunity to ask questions and all were answered. She agreed with the plan and demonstrated an understanding of the instructions.   I provided 10 minutes of non-face-to-face time during this encounter.   Ashby Moskal , MD

## 2020-09-02 ENCOUNTER — Encounter: Payer: Self-pay | Admitting: Family Medicine

## 2020-09-13 ENCOUNTER — Other Ambulatory Visit: Payer: Self-pay

## 2020-09-13 ENCOUNTER — Encounter: Payer: Self-pay | Admitting: Family Medicine

## 2020-09-13 ENCOUNTER — Ambulatory Visit (INDEPENDENT_AMBULATORY_CARE_PROVIDER_SITE_OTHER): Payer: Medicare Other | Admitting: Family Medicine

## 2020-09-13 ENCOUNTER — Ambulatory Visit (INDEPENDENT_AMBULATORY_CARE_PROVIDER_SITE_OTHER): Payer: Medicare Other

## 2020-09-13 VITALS — BP 120/74 | HR 72 | Resp 16 | Ht 62.0 in | Wt 154.0 lb

## 2020-09-13 DIAGNOSIS — Z01818 Encounter for other preprocedural examination: Secondary | ICD-10-CM | POA: Diagnosis not present

## 2020-09-13 DIAGNOSIS — R059 Cough, unspecified: Secondary | ICD-10-CM | POA: Diagnosis not present

## 2020-09-13 DIAGNOSIS — I700 Atherosclerosis of aorta: Secondary | ICD-10-CM

## 2020-09-13 DIAGNOSIS — I441 Atrioventricular block, second degree: Secondary | ICD-10-CM

## 2020-09-13 DIAGNOSIS — R0602 Shortness of breath: Secondary | ICD-10-CM | POA: Diagnosis not present

## 2020-09-13 LAB — CUP PACEART REMOTE DEVICE CHECK
Battery Remaining Longevity: 130 mo
Battery Voltage: 3.13 V
Brady Statistic AP VP Percent: 11.81 %
Brady Statistic AP VS Percent: 0 %
Brady Statistic AS VP Percent: 88.17 %
Brady Statistic AS VS Percent: 0.02 %
Brady Statistic RA Percent Paced: 11.77 %
Brady Statistic RV Percent Paced: 99.97 %
Date Time Interrogation Session: 20220110020634
Implantable Lead Implant Date: 20210711
Implantable Lead Implant Date: 20210711
Implantable Lead Location: 753859
Implantable Lead Location: 753860
Implantable Lead Model: 5076
Implantable Lead Model: 5076
Implantable Pulse Generator Implant Date: 20210711
Lead Channel Impedance Value: 361 Ohm
Lead Channel Impedance Value: 418 Ohm
Lead Channel Impedance Value: 475 Ohm
Lead Channel Impedance Value: 589 Ohm
Lead Channel Pacing Threshold Amplitude: 0.5 V
Lead Channel Pacing Threshold Amplitude: 0.75 V
Lead Channel Pacing Threshold Pulse Width: 0.4 ms
Lead Channel Pacing Threshold Pulse Width: 0.4 ms
Lead Channel Sensing Intrinsic Amplitude: 4.375 mV
Lead Channel Sensing Intrinsic Amplitude: 4.375 mV
Lead Channel Sensing Intrinsic Amplitude: 5.75 mV
Lead Channel Sensing Intrinsic Amplitude: 5.75 mV
Lead Channel Setting Pacing Amplitude: 1.5 V
Lead Channel Setting Pacing Amplitude: 2.5 V
Lead Channel Setting Pacing Pulse Width: 0.4 ms
Lead Channel Setting Sensing Sensitivity: 1.2 mV

## 2020-09-13 MED ORDER — BENZONATATE 100 MG PO CAPS: 200.0000 mg | ORAL_CAPSULE | Freq: Two times a day (BID) | ORAL | 0 refills | Status: AC | PRN

## 2020-09-13 NOTE — Patient Instructions (Signed)
A few things to remember from today's visit:  Preop examination  Cough - Plan: DG Chest 2 View  Los pulmones estan limpios. Pare Celebrex y Aspirina 7-10 dias antes de la cirujia. No tome medicamentos el dia de la cirujia. Empiece los medicamentos el dia siguiente.  Please be sure medication list is accurate. If a new problem present, please set up appointment sooner than planned today.

## 2020-09-13 NOTE — Progress Notes (Signed)
Chief Complaint  Patient presents with  . surgery clearance    Cardiac clearance done by pt's cardiologist.    HPI: Tiffany Berg is a 65 y.o. 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 She is planning on right foot surgery on 2/11/222. Pain is getting worse interfering with sleep. She is taking Celebrex. No hx of tobacco use or diabetes.  She has had hysterectomy about 3-4 years, general anesthesia, no complications. Left breast mass 6-7 years ago, she is not sure about type of anesthesia.   Denies any CP,palpitations,diaphoresis,or dizziness when climbing a flight of stairs, walking a hill,or carrying heavy groceries.  Lab Results  Component Value Date   CREATININE 0.78 06/07/2020   BUN 18 06/07/2020   NA 139 06/07/2020   K 4.0 06/07/2020   CL 102 06/07/2020   CO2 31 06/07/2020   Last visit, 09/01/20, she was having upper respiratory symptoms and sore throat. Rapid strep negative. Most symptoms have resolved. Productive cough, occasionally with brown sputum, worse at night. For the past 2 days she had had "little" SOB. Denies fever,chills,changes in appetite,or wheezing.  She has not tried OTC medication.  Review of Systems  Constitutional: Positive for fatigue. Negative for activity change.  HENT: Negative for mouth sores, nosebleeds and trouble swallowing.   Eyes: Negative for redness and visual disturbance.  Cardiovascular: Negative for leg swelling.  Gastrointestinal: Negative for abdominal pain, nausea and vomiting.       Negative for changes in bowel habits.  Genitourinary: Negative for decreased urine volume, dysuria and hematuria.  Neurological: Negative for syncope, weakness and headaches.  Psychiatric/Behavioral: Negative for confusion. The patient is  nervous/anxious.   Rest of ROS, see pertinent positives sand negatives in HPI  Current Outpatient Medications on File Prior to Visit  Medication Sig Dispense Refill  . aspirin EC 81 MG tablet Take 81 mg by mouth daily.    . celecoxib (CELEBREX) 200 MG capsule Take 1 capsule (200 mg total) by mouth 2 (two) times daily as needed. 60 capsule 6  . chlorhexidine (PERIDEX) 0.12 % solution SMARTSIG:By Mouth    . gabapentin (NEURONTIN) 100 MG capsule Take 2 capsules (200 mg total) by mouth at bedtime. 60 capsule 3  . levothyroxine (SYNTHROID) 75 MCG tablet Take 1 tablet (75 mcg total) by mouth daily before breakfast. 90 tablet 3  . lovastatin (MEVACOR) 40 MG tablet Take 1 tablet (40 mg total) by mouth at bedtime. 90 tablet 1  . tiZANidine (ZANAFLEX) 2 MG tablet Take 1-2 tablets (2-4 mg total) by mouth every 6 (six) hours as needed for muscle spasms. 60 tablet 1   No current facility-administered medications on file prior to visit.   Past Medical History:  Diagnosis Date  . Cancer ([place])    left breast  . Depression    undiagnosed- pt feels like crying sometimes for no reason  . Headache   . Hyperlipidemia   . Hypothyroidism   . Mobitz II 03/12/2020  . Symptomatic bradycardia 03/12/2020   No Known Allergies  Social History   Socioeconomic History  . Marital status: Legally Separated    Spouse name: Not on file  . Number of children: Not on file  . Years of education: Not on file  . Highest education level: Not on file  Occupational History  . Not on file  Tobacco Use  . Smoking status: Never Smoker  . Smokeless tobacco: Never Used  Substance and Sexual Activity  . Alcohol use: Yes    Comment: socially  . Drug use: No  . Sexual activity: Yes    Birth control/protection: Post-menopausal, Surgical  Other Topics Concern  . Not on file  Social History Narrative  . Not on file   Social Determinants of Health   Financial Resource Strain: Not on file  Food Insecurity: Not on file   Transportation Needs: Not on file  Physical Activity: Not on file  Stress: Not on file  Social Connections: Not on file   Vitals:   09/13/20 1018  BP: 120/74  Pulse: 72  Resp: 16  SpO2: 96%   Body mass index is 28.17 kg/m.  Physical Exam Vitals and nursing note reviewed.  Constitutional:      General: She is not in acute distress.    Appearance: She is well-developed.  HENT:     Head: Normocephalic and atraumatic.     Nose:     Right Turbinates: Not enlarged.     Left Turbinates: Not enlarged.     Right Sinus: No maxillary sinus tenderness or frontal sinus tenderness.     Left Sinus: No maxillary sinus tenderness or frontal sinus tenderness.     Mouth/Throat:     Mouth: Oropharynx is clear and moist and mucous membranes are normal. Mucous membranes are moist.     Pharynx: Oropharynx is clear.     Comments: Post nasal drainage. Eyes:     Conjunctiva/sclera: Conjunctivae normal.     Pupils: Pupils are equal, round, and reactive to light.  Cardiovascular:     Rate and Rhythm: Normal rate and regular rhythm.     Pulses:          Dorsalis pedis pulses are 2+ on the right side and 2+ on the left side.     Heart sounds: No murmur heard.   Pulmonary:     Effort: Pulmonary effort is normal. No respiratory distress.     Breath sounds: Normal breath sounds.  Abdominal:     Palpations: Abdomen is soft. There is no hepatomegaly or mass.     Tenderness: There is no abdominal tenderness.  Musculoskeletal:        General: No edema.  Lymphadenopathy:     Cervical: No cervical adenopathy.  Skin:    General: Skin is warm.     Findings: No erythema or rash.  Neurological:     Mental Status: She is alert and oriented to person, place, and time.     Cranial Nerves: No cranial nerve deficit.     Gait: Gait normal.     Deep Tendon Reflexes: Strength normal.  Psychiatric:        Mood and Affect: Mood and affect normal.     Comments: Well groomed, good eye contact.   ASSESSMENT  AND PLAN:  Tiffany Berg was seen today for surgical clearance.  Orders Placed This Encounter  Procedures  . DG Chest 2 View    Preop examination She was already cleared by her cardiologist. Instructed to discontinued all supplements,Aspirin, and Celebrex 10 days before surgery. She can hold on her prescription meds day of surgery. Early ambulation for DVT prevention. Post surgical pain management: Some side effects of opioid medications.  Cough Lung auscultation negative. Explained that congestion and cough can last a few days after acute symptoms have resolved. Further recommendations according to CXR.  -  benzonatate (TESSALON) 100 MG capsule; Take 2 capsules (200 mg total) by mouth 2 (two) times daily as needed for up to 10 days.  Atherosclerosis of aorta (HCC) Continue Lovastatin 40 mg daily and aspirin 81 mg (resume after surgery).  Return if symptoms worsen or fail to improve.   Zohaib Heeney G. Martinique, MD  [facility]. [place] office.  A few things to remember from today's visit:  Preop examination  Cough - Plan: DG Chest 2 View  Los pulmones estan limpios. Pare Celebrex y Aspirina 7-10 dias antes de la cirujia. No tome medicamentos el dia de la cirujia. Empiece los medicamentos el dia siguiente.  Please be sure medication list is accurate. If a new problem present, please set up appointment sooner than planned today.

## 2020-09-14 DIAGNOSIS — I700 Atherosclerosis of aorta: Secondary | ICD-10-CM | POA: Insufficient documentation

## 2020-09-27 NOTE — Progress Notes (Signed)
Remote pacemaker transmission.   

## 2020-09-29 ENCOUNTER — Encounter: Payer: Self-pay | Admitting: Family Medicine

## 2020-09-29 ENCOUNTER — Ambulatory Visit (INDEPENDENT_AMBULATORY_CARE_PROVIDER_SITE_OTHER): Payer: Medicare Other | Admitting: Family Medicine

## 2020-09-29 ENCOUNTER — Other Ambulatory Visit: Payer: Self-pay

## 2020-09-29 VITALS — BP 126/80 | HR 96 | Resp 16 | Ht 62.0 in | Wt 155.0 lb

## 2020-09-29 DIAGNOSIS — M79671 Pain in right foot: Secondary | ICD-10-CM | POA: Diagnosis not present

## 2020-09-29 DIAGNOSIS — M79672 Pain in left foot: Secondary | ICD-10-CM | POA: Diagnosis not present

## 2020-09-29 MED ORDER — CRUTCH HANDGRIPS MISC: 1.0000 [IU] | 0 refills | Status: DC

## 2020-09-29 NOTE — Patient Instructions (Signed)
A few things to remember from today's visit:   Pain in both feet  If you need refills please call your pharmacy. Do not use My Chart to request refills or for acute issues that need immediate attention.   El cirujano le va a Software engineer cuanto tiempo necesita estar fuera del [place].  Please be sure medication list is accurate. If a new problem present, please set up appointment sooner than planned today.

## 2020-09-29 NOTE — H&P (View-Only) (Signed)
Chief Complaint  Patient presents with  . Follow-up    She has a few concerns about oncoming foot surgery.    HPI: [name] is a 65 y.o. female, who is here today because she has some questions and concerns about foot surgery. She has had bilateral foot pain, interfering with sleep. Planning on having right right foot surgery on 10/15/20.  She is concerned about ambulating with no support after surgery and about work. She has not told employer about surgery. She would like a Rx for crutches.  She does not speak [place] and understands some, she goes to visits with a Optometrist. She does not recall conversation about FMLA , wonders for how long she needs to stay out of work.  She lives alone. Surgery is ambulatory and she is planning on staying home. Her daughter is planning on check on her to be sure she has everything she needs. She has 2 steps to get into her house.   Review of Systems  Constitutional: Negative for chills and fever.  Musculoskeletal: Positive for arthralgias. Negative for gait problem.  Psychiatric/Behavioral: Negative for confusion. The patient is nervous/anxious.   Rest see pertinent positives and negatives per HPI.  Current Outpatient Medications on File Prior to Visit  Medication Sig Dispense Refill  . aspirin EC 81 MG tablet Take 81 mg by mouth daily.    . celecoxib (CELEBREX) 200 MG capsule Take 1 capsule (200 mg total) by mouth 2 (two) times daily as needed. 60 capsule 6  . chlorhexidine (PERIDEX) 0.12 % solution SMARTSIG:By Mouth    . gabapentin (NEURONTIN) 100 MG capsule Take 2 capsules (200 mg total) by mouth at bedtime. 60 capsule 3  . levothyroxine (SYNTHROID) 75 MCG tablet Take 1 tablet (75 mcg total) by mouth daily before breakfast. 90 tablet 3  . lovastatin (MEVACOR) 40 MG tablet Take 1 tablet (40 mg total) by mouth at bedtime. 90 tablet 1  . tiZANidine (ZANAFLEX) 2 MG tablet Take 1-2 tablets (2-4 mg total) by mouth every 6 (six)  hours as needed for muscle spasms. 60 tablet 1   No current facility-administered medications on file prior to visit.     Past Medical History:  Diagnosis Date  . Cancer ([place])    left breast  . Depression    undiagnosed- pt feels like crying sometimes for no reason  . Headache   . Hyperlipidemia   . Hypothyroidism   . Mobitz II 03/12/2020  . Symptomatic bradycardia 03/12/2020   No Known Allergies  Social History   Socioeconomic History  . Marital status: Legally Separated    Spouse name: Not on file  . Number of children: Not on file  . Years of education: Not on file  . Highest education level: Not on file  Occupational History  . Not on file  Tobacco Use  . Smoking status: Never Smoker  . Smokeless tobacco: Never Used  Substance and Sexual Activity  . Alcohol use: Yes    Comment: socially  . Drug use: No  . Sexual activity: Yes    Birth control/protection: Post-menopausal, Surgical  Other Topics Concern  . Not on file  Social History Narrative  . Not on file   Social Determinants of Health   Financial Resource Strain: Not on file  Food Insecurity: Not on file  Transportation Needs: Not on file  Physical Activity: Not on file  Stress: Not on file  Social Connections: Not on file    Vitals:  09/29/20 0923  BP: 126/80  Pulse: 96  Resp: 16  SpO2: 99%   Body mass index is 28.35 kg/m.  Physical Exam Vitals and nursing note reviewed.  Eyes:     Conjunctiva/sclera: Conjunctivae normal.  Cardiovascular:     Rate and Rhythm: Normal rate and regular rhythm.     Heart sounds: No murmur heard.   Pulmonary:     Effort: Pulmonary effort is normal.     Breath sounds: Normal breath sounds.  Musculoskeletal:     Right lower leg: No edema.     Left lower leg: No edema.  Neurological:     General: No focal deficit present.     Mental Status: She is alert and oriented to person, place, and time.     Gait: Gait normal.  Psychiatric:        Mood and Affect:  Mood is anxious. Mood is not depressed.   ASSESSMENT AND PLAN:  Lauryl was seen today for follow-up.  Diagnoses and all orders for this visit:  Pain in both feet -     Misc. Devices (CRUTCH HANDGRIPS) MISC; 1 Units by Does not apply route as directed. To use after foot surgery.  Other orders -     Discontinue: Misc. Devices (CRUTCH HANDGRIPS) MISC; 1 Units by Does not apply route as directed. To use after foot surgery.  Explained that surgeon will complete FMLA for the time she needs to be out of work. She needs to let employer know she is having surgery. Most likely opioids are going to be prescribed for pain management,some side effects  Rx for crutches given. She already has a long boot. Follow podiatrist's instructions.  Spent 24 minutes with pt.  During this time history was obtained and documented, examination was performed, and assessment/plan discussed.  Return if symptoms worsen or fail to improve.   Edgel Degnan G. [place], MD  [facility]. [place] office.   A few things to remember from today's visit:   Pain in both feet  If you need refills please call your pharmacy. Do not use My Chart to request refills or for acute issues that need immediate attention.   El cirujano le va a Software engineer cuanto tiempo necesita estar fuera del [place].  Please be sure medication list is accurate. If a new problem present, please set up appointment sooner than planned today.

## 2020-09-29 NOTE — Progress Notes (Signed)
Chief Complaint  Patient presents with  . Follow-up    She has a few concerns about oncoming foot surgery.    HPI: [name] is a 65 y.o. female, who is here today because she has some questions and concerns about foot surgery. She has had bilateral foot pain, interfering with sleep. Planning on having right right foot surgery on 10/15/20.  She is concerned about ambulating with no support after surgery and about work. She has not told employer about surgery. She would like a Rx for crutches.  She does not speak [place] and understands some, she goes to visits with a Optometrist. She does not recall conversation about FMLA , wonders for how long she needs to stay out of work.  She lives alone. Surgery is ambulatory and she is planning on staying home. Her daughter is planning on check on her to be sure she has everything she needs. She has 2 steps to get into her house.   Review of Systems  Constitutional: Negative for chills and fever.  Musculoskeletal: Positive for arthralgias. Negative for gait problem.  Psychiatric/Behavioral: Negative for confusion. The patient is nervous/anxious.   Rest see pertinent positives and negatives per HPI.  Current Outpatient Medications on File Prior to Visit  Medication Sig Dispense Refill  . aspirin EC 81 MG tablet Take 81 mg by mouth daily.    . celecoxib (CELEBREX) 200 MG capsule Take 1 capsule (200 mg total) by mouth 2 (two) times daily as needed. 60 capsule 6  . chlorhexidine (PERIDEX) 0.12 % solution SMARTSIG:By Mouth    . gabapentin (NEURONTIN) 100 MG capsule Take 2 capsules (200 mg total) by mouth at bedtime. 60 capsule 3  . levothyroxine (SYNTHROID) 75 MCG tablet Take 1 tablet (75 mcg total) by mouth daily before breakfast. 90 tablet 3  . lovastatin (MEVACOR) 40 MG tablet Take 1 tablet (40 mg total) by mouth at bedtime. 90 tablet 1  . tiZANidine (ZANAFLEX) 2 MG tablet Take 1-2 tablets (2-4 mg total) by mouth every 6 (six)  hours as needed for muscle spasms. 60 tablet 1   No current facility-administered medications on file prior to visit.     Past Medical History:  Diagnosis Date  . Cancer ([place])    left breast  . Depression    undiagnosed- pt feels like crying sometimes for no reason  . Headache   . Hyperlipidemia   . Hypothyroidism   . Mobitz II 03/12/2020  . Symptomatic bradycardia 03/12/2020   No Known Allergies  Social History   Socioeconomic History  . Marital status: Legally Separated    Spouse name: Not on file  . Number of children: Not on file  . Years of education: Not on file  . Highest education level: Not on file  Occupational History  . Not on file  Tobacco Use  . Smoking status: Never Smoker  . Smokeless tobacco: Never Used  Substance and Sexual Activity  . Alcohol use: Yes    Comment: socially  . Drug use: No  . Sexual activity: Yes    Birth control/protection: Post-menopausal, Surgical  Other Topics Concern  . Not on file  Social History Narrative  . Not on file   Social Determinants of Health   Financial Resource Strain: Not on file  Food Insecurity: Not on file  Transportation Needs: Not on file  Physical Activity: Not on file  Stress: Not on file  Social Connections: Not on file    Vitals:  09/29/20 0923  BP: 126/80  Pulse: 96  Resp: 16  SpO2: 99%   Body mass index is 28.35 kg/m.  Physical Exam Vitals and nursing note reviewed.  Eyes:     Conjunctiva/sclera: Conjunctivae normal.  Cardiovascular:     Rate and Rhythm: Normal rate and regular rhythm.     Heart sounds: No murmur heard.   Pulmonary:     Effort: Pulmonary effort is normal.     Breath sounds: Normal breath sounds.  Musculoskeletal:     Right lower leg: No edema.     Left lower leg: No edema.  Neurological:     General: No focal deficit present.     Mental Status: She is alert and oriented to person, place, and time.     Gait: Gait normal.  Psychiatric:        Mood and Affect:  Mood is anxious. Mood is not depressed.   ASSESSMENT AND PLAN:  Tiffany Berg was seen today for follow-up.  Diagnoses and all orders for this visit:  Pain in both feet -     Misc. Devices (CRUTCH HANDGRIPS) MISC; 1 Units by Does not apply route as directed. To use after foot surgery.  Other orders -     Discontinue: Misc. Devices (CRUTCH HANDGRIPS) MISC; 1 Units by Does not apply route as directed. To use after foot surgery.  Explained that surgeon will complete FMLA for the time she needs to be out of work. She needs to let employer know she is having surgery. Most likely opioids are going to be prescribed for pain management,some side effects  Rx for crutches given. She already has a long boot. Follow podiatrist's instructions.  Spent 24 minutes with pt.  During this time history was obtained and documented, examination was performed, and assessment/plan discussed.  Return if symptoms worsen or fail to improve.   Esraa Seres G. Megean Fabio, MD  [facility]. [place] office.   A few things to remember from today's visit:   Pain in both feet  If you need refills please call your pharmacy. Do not use My Chart to request refills or for acute issues that need immediate attention.   El cirujano le va a decir cuanto tiempo necesita estar fuera del [place].  Please be sure medication list is accurate. If a new problem present, please set up appointment sooner than planned today.      

## 2020-09-30 ENCOUNTER — Telehealth: Payer: Self-pay | Admitting: Family Medicine

## 2020-09-30 DIAGNOSIS — M79672 Pain in left foot: Secondary | ICD-10-CM

## 2020-09-30 DIAGNOSIS — M79671 Pain in right foot: Secondary | ICD-10-CM

## 2020-09-30 NOTE — Telephone Encounter (Signed)
[employer] called in reference to an order that was supposed to be sent in for the patient to have crutches.  [employer] is wanting to know if the order was placed.  They will call back tomorrow when Dr. Martinique is back in the office.

## 2020-10-01 NOTE — Addendum Note (Signed)
Addended by: Rodrigo Ran on: 10/01/2020 08:55 AM   Modules accepted: Orders

## 2020-10-01 NOTE — Telephone Encounter (Signed)
[employer] will provide crutches for pt.

## 2020-10-01 NOTE — Telephone Encounter (Signed)
I sent a message to [employer] to see if they have any crutches available.

## 2020-10-06 ENCOUNTER — Encounter: Payer: Self-pay | Admitting: Cardiology

## 2020-10-06 NOTE — Progress Notes (Signed)
PERIOPERATIVE PRESCRIPTION FOR IMPLANTED CARDIAC DEVICE PROGRAMMING  Patient Information: Name:  Tiffany Berg  DOB:  1955/10/09  MRN:  [id number]    Burnard Leigh, RN  P Cv Div Heartcare Device Planned Procedure: Right Bunionectomy/ hammer toe correction  Surgeon: Dr Lanae Crumbly  Date of Procedure: 10-15-2020  Cautery will be used.  Position during surgery: Supine   Please send documentation back to:  [place] (Fax # [phone])   Phineas Inches, RN   Device Information:  Clinic EP Physician:  Allegra Lai, MD   Device Type:  Pacemaker Manufacturer and Phone #:  Medtronic: [id number] Pacemaker Dependent?:  Yes.   Date of Last Device Check:  09/13/20 (remote) 06/29/20 (in-clinic) Normal Device Function?:  Yes.    Electrophysiologist's Recommendations:   Have magnet available.  Provide continuous ECG monitoring when magnet is used or reprogramming is to be performed.   Procedure should not interfere with device function.  No device programming or magnet placement needed.  Per Device Clinic Standing Orders, York Ram, RN  1:16 PM 10/06/2020

## 2020-10-07 ENCOUNTER — Telehealth: Payer: Self-pay

## 2020-10-07 NOTE — Telephone Encounter (Signed)
DOS 10/15/2020  [facility]KEN OSTEOTOMY RT - 28310 LAPIDUS PROC INC BUNIONECTOMY RT - 28003 HAMMERTOE REPAIR 2ND RT - 28285 BONE GRAFT RT - 20900  [facility] EFFECTIVE DATE - 09/04/2020  PLAN DEDUCTIBLE - $0.00 OUT OF POCKET - $7550.00 W/ $7550.00 REMAINING  CO-INSURANCE 20% / Day OUTPATIENT SURGERY 20% / [place] $0 / Day OUTPATIENT SURGERY $0 / [place]   NOTIFICATION/PRIOR AUTHORIZATION NUMBER CASE STATUS CASE STATUS REASON PRIMARY CARE PHYSICIAN [id number] Closed Case Was Managed And Is Now Complete Betty Gelvez Martinique ADVANCE NOTIFY DATE/TIME ADMISSION NOTIFY DATE/TIME 10/06/2020 09:04 AM CST - COVERAGE STATUS OVERALL COVERAGE STATUS Covered/Approved 1-4 CODE DESCRIPTION COVERAGE STATUS DECISION DATE [place] Coverage determination is reflected for the facility admission and is not a guarantee of payment for ongoing services. Covered/Approved 10/06/2020 1 20900 Bone graft, any donor area; minor or sma more Covered/Approved 10/06/2020 2 28285 Correction, hammertoe (eg, interphalange more Covered/Approved 10/06/2020 3 28297 Correction, hallux valgus (bunionectomy) more Covered/Approved 10/06/2020 4 28310 Osteotomy, shortening, angular or rotati more Covered/Approved 10/06/2020

## 2020-10-11 ENCOUNTER — Other Ambulatory Visit: Payer: Self-pay | Admitting: Family Medicine

## 2020-10-11 ENCOUNTER — Telehealth: Payer: Self-pay

## 2020-10-11 DIAGNOSIS — E039 Hypothyroidism, unspecified: Secondary | ICD-10-CM

## 2020-10-11 DIAGNOSIS — E785 Hyperlipidemia, unspecified: Secondary | ICD-10-CM

## 2020-10-11 NOTE — Telephone Encounter (Signed)
I am not sure about what labs she was supposed to have. I placed order for FLP and TSH. Thanks, BJ

## 2020-10-11 NOTE — Telephone Encounter (Signed)
Patient has a lab appointment on Thursday for fasting labs, but no orders are in. Which labs do you want drawn?

## 2020-10-13 ENCOUNTER — Encounter (HOSPITAL_BASED_OUTPATIENT_CLINIC_OR_DEPARTMENT_OTHER): Payer: Self-pay | Admitting: Podiatry

## 2020-10-13 ENCOUNTER — Telehealth: Payer: Self-pay | Admitting: Podiatry

## 2020-10-13 ENCOUNTER — Other Ambulatory Visit: Payer: Self-pay

## 2020-10-13 NOTE — Telephone Encounter (Signed)
Spoke with the patient's daughter Gaynelle Adu via telephone and discussed her mother's upcoming surgery then she and her mom had questions for.  We discussed risk, benefits and potential complications arise after surgery we also discussed the postoperative course including period of nonweightbearing use of crutches and also a rolling knee scooter.  We discussed risks of anesthesia and I discussed these with her cardiologist.  We will plan for surgery with a regional block and monitored anesthesia care.  Also discussed postoperative pain medication that I will send for her on Friday.  Advised to call if they have any further questions otherwise I will see on Friday.

## 2020-10-13 NOTE — Progress Notes (Addendum)
Spoke w/ via phone for pre-op interview--- Pt, thru [place] interpreter [phone] Lab needs dos---- [place]              Lab results------ current ekg in epic/ chart  COVID test ------ 10-14-2020 @ 1120 Arrive at ------- 0830 on  10-15-2020 NPO after [place] Medications to take morning of surgery ----- Synthroid Diabetic medication ----- n/a Patient Special Instructions ----- n/a Pre-Op special Istructions ----- requested spanish interpreter via email to [place] interpreting for [place] , pt did not have gender preference.  Pt pcp H&P received via fax from Dr Sherryle Lis office, dated 09-14-2020, placed with chart.  Also, device orders requested and received via inbox message in epic (printed copy for chart and it's in epic).   Patient verbalized understanding of instructions that were given at this phone interview. Patient denies shortness of breath, chest pain, fever, cough at this phone interview.   Anesthesia : s/p PPM 03-14-2020 for second degree AV block Mobitz 2 / symptomatic bracycardia (device orders w/ chart);  Pt denies cardiac s&s, sob, no peripheral swelling.  Per pcp office note (09-13-2020) pt complaint of cough/ "a little" sob, stated lungs clear and normal cxr.  Pt denied any sob / cough.  PCP: Dr Jacinto Reap. [place] (lov 09-13-2020 epic and H&P 09-14-2020 with chart) Cardiologist : Dr Curt Bears (lov 06-29-2020 epic) Device check: last one 09-13-2020 epic Chest x-ray : 09-13-2020 epic EKG : 06-29-2020 epic Echo : 03-13-2020 epic Stress test: no Cardiac Cath :  no Activity level:  Pt denies sob w/ any activity with exception flight of stairs gets a little sob but recovers quickly when stop Sleep Study/ CPAP :  NO Fasting Blood Sugar :      / Checks Blood Sugar -- times a day:   N/A Blood Thinner/ Instructions Maryjane Hurter Dose:  nO ASA / Instructions/ Last Dose : ASA 81mg ;  Per pt had stopped asa 2 wks ago, approx 10-01-2020.

## 2020-10-13 NOTE — Telephone Encounter (Signed)
Patients daughter called in requesting return call for mom, states she has some concerns/questions regarding surgery that is set for 10/14/20.

## 2020-10-14 ENCOUNTER — Other Ambulatory Visit: Payer: Self-pay

## 2020-10-14 ENCOUNTER — Other Ambulatory Visit (INDEPENDENT_AMBULATORY_CARE_PROVIDER_SITE_OTHER): Payer: Medicare Other

## 2020-10-14 ENCOUNTER — Other Ambulatory Visit (HOSPITAL_COMMUNITY)
Admission: RE | Admit: 2020-10-14 | Discharge: 2020-10-14 | Disposition: A | Discharge status: Home / Self Care | Payer: Medicare Other | Source: Ambulatory Visit | Attending: Podiatry | Admitting: Podiatry

## 2020-10-14 DIAGNOSIS — E785 Hyperlipidemia, unspecified: Secondary | ICD-10-CM

## 2020-10-14 DIAGNOSIS — Z01812 Encounter for preprocedural laboratory examination: Secondary | ICD-10-CM | POA: Diagnosis not present

## 2020-10-14 DIAGNOSIS — M79671 Pain in right foot: Secondary | ICD-10-CM | POA: Diagnosis not present

## 2020-10-14 DIAGNOSIS — E039 Hypothyroidism, unspecified: Secondary | ICD-10-CM

## 2020-10-14 DIAGNOSIS — M79672 Pain in left foot: Secondary | ICD-10-CM | POA: Diagnosis not present

## 2020-10-14 DIAGNOSIS — Z20822 Contact with and (suspected) exposure to covid-19: Secondary | ICD-10-CM | POA: Diagnosis not present

## 2020-10-14 LAB — LIPID PANEL
Cholesterol: 232 mg/dL — ABNORMAL HIGH (ref 0–200)
HDL: 52.3 mg/dL
LDL Cholesterol: 160 mg/dL — ABNORMAL HIGH (ref 0–99)
NonHDL: 179.78
Total CHOL/HDL Ratio: 4
Triglycerides: 101 mg/dL (ref 0.0–149.0)
VLDL: 20.2 mg/dL (ref 0.0–40.0)

## 2020-10-14 LAB — SARS CORONAVIRUS 2 (TAT 6-24 HRS): SARS Coronavirus 2: NEGATIVE

## 2020-10-14 LAB — TSH: TSH: 2.34 u[IU]/mL (ref 0.35–4.50)

## 2020-10-14 NOTE — Anesthesia Preprocedure Evaluation (Addendum)
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  Reproductive/Obstetrics negative OB ROS                            Anesthesia Physical Anesthesia Plan  ASA: II  Anesthesia Plan: MAC and Regional   Post-op Pain Management:    Induction:   PONV Risk Score and Plan: 2 and Treatment may vary due to age or medical condition, Propofol infusion, Ondansetron and Midazolam  Airway Management Planned: Natural Airway and Simple Face Mask  Additional Equipment: None  Intra-op Plan:   Post-operative Plan:   Informed Consent: I have reviewed the patients History and Physical, chart, labs and discussed the procedure including the risks, benefits and alternatives for the proposed anesthesia with the patient or authorized representative who has indicated his/her understanding and acceptance.       Plan Discussed with: CRNA  Anesthesia Plan Comments:        Anesthesia Quick Evaluation

## 2020-10-15 ENCOUNTER — Ambulatory Visit (HOSPITAL_BASED_OUTPATIENT_CLINIC_OR_DEPARTMENT_OTHER): Payer: Medicare Other | Admitting: Anesthesiology

## 2020-10-15 ENCOUNTER — Ambulatory Visit (HOSPITAL_BASED_OUTPATIENT_CLINIC_OR_DEPARTMENT_OTHER): Payer: Medicare Other

## 2020-10-15 ENCOUNTER — Other Ambulatory Visit: Payer: Self-pay

## 2020-10-15 ENCOUNTER — Encounter (HOSPITAL_BASED_OUTPATIENT_CLINIC_OR_DEPARTMENT_OTHER): Payer: Self-pay | Admitting: Podiatry

## 2020-10-15 ENCOUNTER — Ambulatory Visit (HOSPITAL_BASED_OUTPATIENT_CLINIC_OR_DEPARTMENT_OTHER)
Admission: RE | Admit: 2020-10-15 | Discharge: 2020-10-15 | Disposition: A | Discharge status: Home / Self Care | Payer: Medicare Other | Source: Ambulatory Visit | Attending: Podiatry | Admitting: Podiatry

## 2020-10-15 ENCOUNTER — Encounter (HOSPITAL_BASED_OUTPATIENT_CLINIC_OR_DEPARTMENT_OTHER)
Admission: RE | Disposition: A | Discharge status: Home / Self Care | Payer: Self-pay | Source: Ambulatory Visit | Attending: Podiatry

## 2020-10-15 ENCOUNTER — Ambulatory Visit (HOSPITAL_COMMUNITY): Payer: Medicare Other

## 2020-10-15 DIAGNOSIS — M2011 Hallux valgus (acquired), right foot: Secondary | ICD-10-CM | POA: Insufficient documentation

## 2020-10-15 DIAGNOSIS — Z79899 Other long term (current) drug therapy: Secondary | ICD-10-CM | POA: Insufficient documentation

## 2020-10-15 DIAGNOSIS — Q66211 Congenital metatarsus primus varus, right foot: Secondary | ICD-10-CM | POA: Diagnosis not present

## 2020-10-15 DIAGNOSIS — M205X1 Other deformities of toe(s) (acquired), right foot: Secondary | ICD-10-CM | POA: Diagnosis not present

## 2020-10-15 DIAGNOSIS — M2012 Hallux valgus (acquired), left foot: Secondary | ICD-10-CM

## 2020-10-15 DIAGNOSIS — M21611 Bunion of right foot: Secondary | ICD-10-CM | POA: Insufficient documentation

## 2020-10-15 DIAGNOSIS — Z853 Personal history of malignant neoplasm of breast: Secondary | ICD-10-CM | POA: Insufficient documentation

## 2020-10-15 DIAGNOSIS — M2041 Other hammer toe(s) (acquired), right foot: Secondary | ICD-10-CM | POA: Insufficient documentation

## 2020-10-15 DIAGNOSIS — M2042 Other hammer toe(s) (acquired), left foot: Secondary | ICD-10-CM

## 2020-10-15 DIAGNOSIS — M7989 Other specified soft tissue disorders: Secondary | ICD-10-CM | POA: Diagnosis not present

## 2020-10-15 DIAGNOSIS — Z9889 Other specified postprocedural states: Secondary | ICD-10-CM

## 2020-10-15 DIAGNOSIS — Z981 Arthrodesis status: Secondary | ICD-10-CM | POA: Diagnosis not present

## 2020-10-15 HISTORY — DX: Intraductal carcinoma in situ of left breast: D05.12

## 2020-10-15 HISTORY — DX: Prediabetes: R73.03

## 2020-10-15 HISTORY — DX: Presence of spectacles and contact lenses: Z97.3

## 2020-10-15 HISTORY — PX: HALLUX VALGUS LAPIDUS: SHX6626

## 2020-10-15 HISTORY — DX: Presence of dental prosthetic device (complete) (partial): Z97.2

## 2020-10-15 HISTORY — DX: Presence of cardiac pacemaker: Z95.0

## 2020-10-15 HISTORY — PX: HAMMER TOE SURGERY: SHX385

## 2020-10-15 HISTORY — PX: AIKEN OSTEOTOMY: SHX6331

## 2020-10-15 LAB — POCT I-STAT, CHEM 8
BUN: 14 mg/dL (ref 8–23)
Calcium, Ion: 1.27 mmol/L (ref 1.15–1.40)
Chloride: 103 mmol/L (ref 98–111)
Creatinine, Ser: 0.6 mg/dL (ref 0.44–1.00)
Glucose, Bld: 129 mg/dL — ABNORMAL HIGH (ref 70–99)
HCT: 44 % (ref 36.0–46.0)
Hemoglobin: 15 g/dL (ref 12.0–15.0)
Potassium: 3.4 mmol/L — ABNORMAL LOW (ref 3.5–5.1)
Sodium: 143 mmol/L (ref 135–145)
TCO2: 27 mmol/L (ref 22–32)

## 2020-10-15 SURGERY — BUNIONECTOMY, LAPIDUS
Anesthesia: Monitor Anesthesia Care | Site: Toe | Laterality: Right

## 2020-10-15 MED ORDER — ACETAMINOPHEN 500 MG PO TABS
ORAL_TABLET | ORAL | Status: AC
  Filled 2020-10-15: qty 2

## 2020-10-15 MED ORDER — DEXMEDETOMIDINE (PRECEDEX) IN NS 20 MCG/5ML (4 MCG/ML) IV SYRINGE
PREFILLED_SYRINGE | INTRAVENOUS | Status: AC
  Filled 2020-10-15: qty 5

## 2020-10-15 MED ORDER — FENTANYL CITRATE (PF) 100 MCG/2ML IJ SOLN: 25.0000 ug | INTRAMUSCULAR | Status: DC | PRN

## 2020-10-15 MED ORDER — ONDANSETRON HCL 4 MG/2ML IJ SOLN
INTRAMUSCULAR | Status: DC | PRN
  Administered 2020-10-15: 4 mg via INTRAVENOUS

## 2020-10-15 MED ORDER — LIDOCAINE HCL 2 % IJ SOLN
INTRAMUSCULAR | Status: DC | PRN
  Administered 2020-10-15: 5 mL

## 2020-10-15 MED ORDER — ACETAMINOPHEN 500 MG PO TABS: 1000.0000 mg | ORAL_TABLET | Freq: Four times a day (QID) | ORAL | 0 refills | Status: AC | PRN

## 2020-10-15 MED ORDER — LACTATED RINGERS IV SOLN: INTRAVENOUS | Status: DC

## 2020-10-15 MED ORDER — BUPIVACAINE HCL (PF) 0.5 % IJ SOLN
INTRAMUSCULAR | Status: DC | PRN
  Administered 2020-10-15: 30 mL via PERINEURAL

## 2020-10-15 MED ORDER — OXYCODONE HCL 5 MG PO TABS: 5.0000 mg | ORAL_TABLET | Freq: Once | ORAL | Status: DC | PRN

## 2020-10-15 MED ORDER — MIDAZOLAM HCL 2 MG/2ML IJ SOLN
1.0000 mg | Freq: Once | INTRAMUSCULAR | Status: AC
  Administered 2020-10-15: 1 mg via INTRAVENOUS

## 2020-10-15 MED ORDER — ASPIRIN EC 325 MG PO TBEC: 325.0000 mg | DELAYED_RELEASE_TABLET | Freq: Two times a day (BID) | ORAL | 0 refills | Status: AC

## 2020-10-15 MED ORDER — OXYCODONE HCL 5 MG/5ML PO SOLN
5.0000 mg | Freq: Once | ORAL | Status: DC | PRN
Start: 2020-10-15 — End: 2020-10-15

## 2020-10-15 MED ORDER — GABAPENTIN 300 MG PO CAPS: 300.0000 mg | ORAL_CAPSULE | Freq: Three times a day (TID) | ORAL | 0 refills | Status: DC

## 2020-10-15 MED ORDER — FENTANYL CITRATE (PF) 100 MCG/2ML IJ SOLN
50.0000 ug | Freq: Once | INTRAMUSCULAR | Status: AC
  Administered 2020-10-15: 50 ug via INTRAVENOUS

## 2020-10-15 MED ORDER — CEFAZOLIN SODIUM-DEXTROSE 2-4 GM/100ML-% IV SOLN
2.0000 g | INTRAVENOUS | Status: AC
  Administered 2020-10-15: 2 g via INTRAVENOUS

## 2020-10-15 MED ORDER — CEFAZOLIN SODIUM-DEXTROSE 2-4 GM/100ML-% IV SOLN
INTRAVENOUS | Status: AC
  Filled 2020-10-15: qty 100

## 2020-10-15 MED ORDER — FENTANYL CITRATE (PF) 100 MCG/2ML IJ SOLN
INTRAMUSCULAR | Status: AC
  Filled 2020-10-15: qty 2

## 2020-10-15 MED ORDER — PROPOFOL 10 MG/ML IV BOLUS
INTRAVENOUS | Status: AC
  Filled 2020-10-15: qty 20

## 2020-10-15 MED ORDER — PROMETHAZINE HCL 25 MG/ML IJ SOLN: 6.2500 mg | INTRAMUSCULAR | Status: DC | PRN

## 2020-10-15 MED ORDER — IBUPROFEN 600 MG PO TABS: 600.0000 mg | ORAL_TABLET | Freq: Three times a day (TID) | ORAL | 0 refills | Status: AC | PRN

## 2020-10-15 MED ORDER — MIDAZOLAM HCL 2 MG/2ML IJ SOLN
INTRAMUSCULAR | Status: AC
  Filled 2020-10-15: qty 2

## 2020-10-15 MED ORDER — ONDANSETRON HCL 4 MG/2ML IJ SOLN
INTRAMUSCULAR | Status: AC
  Filled 2020-10-15: qty 2

## 2020-10-15 MED ORDER — DEXAMETHASONE SODIUM PHOSPHATE 4 MG/ML IJ SOLN
INTRAMUSCULAR | Status: DC | PRN
  Administered 2020-10-15: 4 mg via INTRAVENOUS

## 2020-10-15 MED ORDER — OXYCODONE HCL 5 MG PO TABS: 5.0000 mg | ORAL_TABLET | ORAL | 0 refills | Status: DC | PRN

## 2020-10-15 MED ORDER — LIDOCAINE HCL (CARDIAC) PF 100 MG/5ML IV SOSY
PREFILLED_SYRINGE | INTRAVENOUS | Status: DC | PRN
  Administered 2020-10-15: 40 mg via INTRAVENOUS

## 2020-10-15 MED ORDER — LIDOCAINE HCL (PF) 2 % IJ SOLN
INTRAMUSCULAR | Status: AC
  Filled 2020-10-15: qty 5

## 2020-10-15 MED ORDER — ACETAMINOPHEN 500 MG PO TABS
1000.0000 mg | ORAL_TABLET | Freq: Once | ORAL | Status: AC
  Administered 2020-10-15: 1000 mg via ORAL

## 2020-10-15 MED ORDER — PROPOFOL 500 MG/50ML IV EMUL
INTRAVENOUS | Status: DC | PRN
  Administered 2020-10-15: 100 ug/kg/min via INTRAVENOUS
  Administered 2020-10-15: 30 mg via INTRAVENOUS

## 2020-10-15 MED ORDER — DEXMEDETOMIDINE (PRECEDEX) IN NS 20 MCG/5ML (4 MCG/ML) IV SYRINGE
PREFILLED_SYRINGE | INTRAVENOUS | Status: DC | PRN
  Administered 2020-10-15: 4 ug via INTRAVENOUS
  Administered 2020-10-15: 2 ug via INTRAVENOUS
  Administered 2020-10-15: 4 ug via INTRAVENOUS
  Administered 2020-10-15: 2 ug via INTRAVENOUS
  Administered 2020-10-15: 4 ug via INTRAVENOUS

## 2020-10-15 MED ORDER — DEXAMETHASONE SODIUM PHOSPHATE 10 MG/ML IJ SOLN
INTRAMUSCULAR | Status: AC
  Filled 2020-10-15: qty 1

## 2020-10-15 SURGICAL SUPPLY — 108 items
0.045 Kwire (Wire) ×2 IMPLANT
0.045 pin head ×2 IMPLANT
APL PRP STRL LF DISP 70% ISPRP (MISCELLANEOUS) ×1
BANDAGE ESMARK 6X9 LF (GAUZE/BANDAGES/DRESSINGS) ×1 IMPLANT
BIT DRILL 2.5 (BIT) ×2
BIT DRILL 2.7 (BIT) ×2
BIT DRILL 2.7XCANN (BIT) ×1 IMPLANT
BIT DRILL 60X2.5XDISP (BIT) ×1 IMPLANT
BIT DRILL SURG BONE GRAFT 8 (DRILL) ×1 IMPLANT
BIT DRL 2.7XCANN (BIT) ×1
BIT DRL 60X2.5XDISP (BIT) ×1
BLADE AVERAGE 25X9 (BLADE) ×2 IMPLANT
BLADE MINI RND TIP GREEN BEAV (BLADE) ×2 IMPLANT
BLADE OSC/SAG .038X5.5 CUT EDG (BLADE) ×2 IMPLANT
BLADE SURG 15 STRL LF DISP TIS (BLADE) ×2 IMPLANT
BLADE SURG 15 STRL SS (BLADE) ×4
BNDG CMPR 9X4 STRL LF SNTH (GAUZE/BANDAGES/DRESSINGS)
BNDG CMPR 9X6 STRL LF SNTH (GAUZE/BANDAGES/DRESSINGS) ×1
BNDG COHESIVE 3X5 TAN STRL LF (GAUZE/BANDAGES/DRESSINGS) ×2 IMPLANT
BNDG CONFORM 2 STRL LF (GAUZE/BANDAGES/DRESSINGS) ×2 IMPLANT
BNDG ELASTIC 3X5.8 VLCR STR LF (GAUZE/BANDAGES/DRESSINGS) ×2 IMPLANT
BNDG ELASTIC 4X5.8 VLCR STR LF (GAUZE/BANDAGES/DRESSINGS) ×2 IMPLANT
BNDG ELASTIC 6X5.8 VLCR STR LF (GAUZE/BANDAGES/DRESSINGS) ×2 IMPLANT
BNDG ESMARK 4X9 LF (GAUZE/BANDAGES/DRESSINGS) IMPLANT
BNDG ESMARK 6X9 LF (GAUZE/BANDAGES/DRESSINGS) ×2
BNDG GAUZE ELAST 4 BULKY (GAUZE/BANDAGES/DRESSINGS) ×2 IMPLANT
BUR MICA 2X12 (BURR) ×1 IMPLANT
BURR MICA 2X12 (BURR) ×2
CHLORAPREP W/TINT 26 (MISCELLANEOUS) ×2 IMPLANT
COVER BACK TABLE 60X90IN (DRAPES) ×2 IMPLANT
COVER WAND RF STERILE (DRAPES) ×2 IMPLANT
CUFF TOURN SGL QUICK 18X4 (TOURNIQUET CUFF) ×2 IMPLANT
DECANTER SPIKE VIAL GLASS SM (MISCELLANEOUS) IMPLANT
DRAPE 3/4 80X56 (DRAPES) ×2 IMPLANT
DRAPE C-ARM 35X43 STRL (DRAPES) IMPLANT
DRAPE EXTREMITY T 121X128X90 (DISPOSABLE) ×2 IMPLANT
DRAPE OEC MINIVIEW 54X84 (DRAPES) ×2 IMPLANT
DRAPE U-SHAPE 47X51 STRL (DRAPES) ×2 IMPLANT
DRILL SURG BONE GRAFT 8 (DRILL) ×2
DRSG PAD ABDOMINAL 8X10 ST (GAUZE/BANDAGES/DRESSINGS) ×2 IMPLANT
DRSG TEGADERM 2-3/8X2-3/4 SM (GAUZE/BANDAGES/DRESSINGS) ×2 IMPLANT
ELECT REM PT RETURN 9FT ADLT (ELECTROSURGICAL) ×2
ELECTRODE REM PT RTRN 9FT ADLT (ELECTROSURGICAL) ×1 IMPLANT
GAUZE 4X4 16PLY RFD (DISPOSABLE) ×2 IMPLANT
GAUZE SPONGE 4X4 12PLY STRL (GAUZE/BANDAGES/DRESSINGS) ×2 IMPLANT
GAUZE SPONGE 4X4 12PLY STRL LF (GAUZE/BANDAGES/DRESSINGS) ×2 IMPLANT
GAUZE XEROFORM 1X8 LF (GAUZE/BANDAGES/DRESSINGS) ×6 IMPLANT
GLOVE SURG ENC MOIS LTX SZ6.5 (GLOVE) ×4 IMPLANT
GLOVE SURG ENC MOIS LTX SZ7 (GLOVE) ×2 IMPLANT
GLOVE SURG UNDER POLY LF SZ7 (GLOVE) ×2 IMPLANT
GLOVE SURG UNDER POLY LF SZ7.5 (GLOVE) ×2 IMPLANT
GOWN STRL REUS W/TWL LRG LVL3 (GOWN DISPOSABLE) ×8 IMPLANT
K-WIRE 0.9 (WIRE) ×4
K-WIRE CAPS STERILE WHITE .045 (WIRE) ×4 IMPLANT
K-WIRE DBL END TROCAR 6X.045 (WIRE) ×4
K-WIRE DBL TROCAR .045X4 (WIRE) ×2
K-WIRE THRD 2.5X150 (WIRE) ×6
KIT TURNOVER CYSTO (KITS) ×2 IMPLANT
KWIRE 0.9 (WIRE) ×2 IMPLANT
KWIRE DBL END TROCAR 6X.045 (WIRE) ×2 IMPLANT
KWIRE DBL TROCAR .045X4 (WIRE) ×1 IMPLANT
KWIRE THRD 2.5X150 (WIRE) ×3 IMPLANT
NDL SAFETY ECLIPSE 18X1.5 (NEEDLE) IMPLANT
NEEDLE HYPO 18GX1.5 SHARP (NEEDLE)
NEEDLE HYPO 25X1 1.5 SAFETY (NEEDLE) ×2 IMPLANT
NS IRRIG 1000ML POUR BTL (IV SOLUTION) IMPLANT
PACK BASIN DAY SURGERY FS (CUSTOM PROCEDURE TRAY) ×2 IMPLANT
PAD CAST 4YDX4 CTTN HI CHSV (CAST SUPPLIES) ×1 IMPLANT
PADDING CAST ABS 4INX4YD NS (CAST SUPPLIES) ×1
PADDING CAST ABS COTTON 4X4 ST (CAST SUPPLIES) ×1 IMPLANT
PADDING CAST COTTON 4X4 STRL (CAST SUPPLIES) ×2
PENCIL SMOKE EVACUATOR (MISCELLANEOUS) ×4 IMPLANT
PIN TEMP (PIN) ×4 IMPLANT
PLATE LAPIDUS STANDARD RT (Plate) ×2 IMPLANT
PLATE LAPIDUS STD RT (Plate) ×1 IMPLANT
SCREW 3.5X 16 NON LOCK (Screw) ×4 IMPLANT
SCREW 4.0X32.5 LONG THREAD (Screw) ×2 IMPLANT
SCREW LOCK 3.5X16 (Screw) ×2 IMPLANT
SCREW LOCK 3.5X22 (Screw) ×2 IMPLANT
SCREW LOCK FT 16X3.5XST PA (Screw) ×1 IMPLANT
SCREW LOCK FT 3.5X14 (Screw) ×2 IMPLANT
SCREW MICA PROSTEP 3.0X22 (Screw) ×2 IMPLANT
SPLINT FIBERGLASS 4X30 (CAST SUPPLIES) ×2 IMPLANT
SPONGE LAP 18X18 RF (DISPOSABLE) ×2 IMPLANT
SPONGE LAP 4X18 RFD (DISPOSABLE) ×2 IMPLANT
STOCKINETTE 6  STRL (DRAPES) ×1
STOCKINETTE 6 STRL (DRAPES) ×1 IMPLANT
SUCTION FRAZIER HANDLE 10FR (MISCELLANEOUS) ×1
SUCTION TUBE FRAZIER 10FR DISP (MISCELLANEOUS) ×1 IMPLANT
SUT ETHILON 3 0 PS 1 (SUTURE) ×6 IMPLANT
SUT ETHILON 4 0 PS 2 18 (SUTURE) ×2 IMPLANT
SUT MNCRL AB 3-0 PS2 18 (SUTURE) ×2 IMPLANT
SUT MNCRL AB 4-0 PS2 18 (SUTURE) ×2 IMPLANT
SUT MON AB 5-0 PS2 18 (SUTURE) ×2 IMPLANT
SUT VIC AB 2-0 CT1 27 (SUTURE) ×4
SUT VIC AB 2-0 CT1 TAPERPNT 27 (SUTURE) ×2 IMPLANT
SUT VIC AB 2-0 SH 27 (SUTURE) ×2
SUT VIC AB 2-0 SH 27XBRD (SUTURE) ×1 IMPLANT
SYR 20ML LL LF (SYRINGE) IMPLANT
SYR BULB EAR ULCER 3OZ GRN STR (SYRINGE) ×2 IMPLANT
SYR CONTROL 10ML LL (SYRINGE) ×2 IMPLANT
TOWEL OR 17X26 10 PK STRL BLUE (TOWEL DISPOSABLE) ×2 IMPLANT
TRAY DSU PREP LF (CUSTOM PROCEDURE TRAY) IMPLANT
TUBE CONNECTING 12X1/4 (SUCTIONS) ×2 IMPLANT
TUBE SURG IRRIGATION (TUBING) ×2 IMPLANT
UNDERPAD 30X36 HEAVY ABSORB (UNDERPADS AND DIAPERS) ×2 IMPLANT
WIRE GUIDE 1.4 NON THRD (WIRE) ×4 IMPLANT
Wright Medical 2.2 Drill Bit ×2 IMPLANT

## 2020-10-15 NOTE — Anesthesia Postprocedure Evaluation (Signed)
Anesthesia Post Note  Patient: Tiffany Berg  Procedure(s) Performed: HALLUX VALGUS LAPIDUS AND BONE GRAFT (Right Toe) AIKEN OSTEOTOMY (Right Toe) HAMMER TOE CORRECTION (Right Toe)     Patient location during evaluation: PACU Anesthesia Type: Regional Level of consciousness: awake and alert and oriented Pain management: pain level controlled Vital Signs Assessment: post-procedure vital signs reviewed and stable Respiratory status: spontaneous breathing, nonlabored ventilation and respiratory function stable Cardiovascular status: blood pressure returned to baseline Postop Assessment: no apparent nausea or vomiting Anesthetic complications: no   No complications documented.  Last Vitals:  Vitals:   10/15/20 1500 10/15/20 1515  BP: 125/71 121/69  Pulse: 60 62  Resp: 10 16  Temp:    SpO2: 93% 93%    Last Pain:  Vitals:   10/15/20 1515  TempSrc:   PainSc: 0-No pain                 Brennan Bailey

## 2020-10-15 NOTE — Anesthesia Procedure Notes (Signed)
Anesthesia Regional Block: Ankle block   Pre-Anesthetic Checklist: ,, timeout performed, Correct Patient, Correct Site, Correct Laterality, Correct Procedure, Correct Position, site marked, Risks and benefits discussed, pre-op evaluation,  At surgeon's request and post-op pain management  Laterality: Right  Prep: Maximum Sterile Barrier Precautions used, chloraprep       Needles:  Injection technique: Single-shot  Needle Type: Echogenic Needle     Needle Length: 4cm  Needle Gauge: 25     Additional Needles:   Narrative:  Start time: 10/15/2020 9:50 AM End time: 10/15/2020 9:55 AM  Performed by: Personally  Anesthesiologist: Brennan Bailey, MD  Additional Notes: Risks, benefits, and alternative discussed. Patient gave consent for procedure. Patient prepped and draped in sterile fashion. Sedation administered, patient remains easily responsive to voice. Local anesthetic given in 5cc increments with no signs or symptoms of intravascular injection. No pain or paraesthesias with injection. Patient monitored throughout procedure with signs of LAST or immediate complications. Tolerated well.   Tawny Asal, MD

## 2020-10-15 NOTE — Interval H&P Note (Signed)
History and Physical Interval Note:  10/15/2020 10:50 AM  Tiffany Berg  has presented today for surgery, with the diagnosis of RIGHT BUNION AND HAMMER TOES.  The various methods of treatment have been discussed with the patient and family. After consideration of risks, benefits and other options for treatment, the patient has consented to  Procedure(s) with comments: [place] (Right) - WITH IV SEDATION AIKEN OSTEOTOMY (Right) HAMMER TOE CORRECTION (Right) as a surgical intervention.  The patient's history has been reviewed, patient examined, no change in status, stable for surgery.  I have reviewed the patient's chart and labs.  Questions were answered to the patient's satisfaction.     Criselda Peaches

## 2020-10-15 NOTE — Transfer of Care (Addendum)
Immediate Anesthesia Transfer of Care Note  Patient: Tiffany Berg  Procedure(s) Performed: Procedure(s) (LRB): HALLUX VALGUS LAPIDUS AND BONE GRAFT (Right) AIKEN OSTEOTOMY (Right) HAMMER TOE CORRECTION (Right)  Patient Location: PACU  Anesthesia Type: MAC  Level of Consciousness: awake, alert  and oriented  Airway & Oxygen Therapy: Patient Spontanous Breathing and Patient connected to face mask oxygen  Post-op Assessment: Report given to PACU RN and Post -op Vital signs reviewed and stable  Post vital signs: Reviewed and stable  Complications: No apparent anesthesia complicationsLast Vitals:  Vitals Value Taken Time  BP 122/66 10/15/20 1431  Temp    Pulse 61 10/15/20 1437  Resp 15 10/15/20 1437  SpO2 95 % 10/15/20 1437  Vitals shown include unvalidated device data.  Last Pain:  Vitals:   10/15/20 0851  TempSrc: Oral  PainSc: 4          Complications: No complications documented.

## 2020-10-15 NOTE — Anesthesia Procedure Notes (Signed)
Procedure Name: MAC Date/Time: 10/15/2020 11:16 AM Performed by: Mechele Claude, CRNA Pre-anesthesia Checklist: Patient identified, Emergency Drugs available, Suction available and Patient being monitored Oxygen Delivery Method: Simple face mask Airway Equipment and Method: Oral airway Placement Confirmation: positive ETCO2,  breath sounds checked- equal and bilateral and CO2 detector

## 2020-10-15 NOTE — Discharge Instructions (Signed)
Post-Surgery Instructions  1. If you are recuperating from surgery anywhere other than home, please be sure to leave [place] a number where you can be reached. 2. Go directly home and rest. 3. The keep operated foot (or feet) elevated six inches above the hip when sitting or lying down. 4. Support the elevated foot and leg with pillows under the calf. DO NOT PLACE PILLOWS UNDER THE KNEE. 5. DO NOT REMOVE or get your bandages wet. This will increase your chances of getting an infection. 6. Wear your surgical shoe at all times when you are up. 7. A limited amount of pain and swelling may occur. The skin may take on a bruised appearance. This is no cause for alarm. 8. For slight pain and swelling, apply an ice pack behind the knee for 15 minutes every hour. Continue icing until seen in the office. DO NOT apply any form of heat to the area. 9. Have prescription(s) filled immediately and take as directed. 10. Drink lots of liquids, water, and juice. 11. CALL THE OFFICE IMMEDIATELY IF: a. Bleeding continues b. Pain increases and/or does not respond to medication c. Bandage or cast appears too tight d. Any liquids (water, coffee, etc.) have spilled on your bandages. e. Tripping, falling, or stubbing the surgical foot f. If your temperature rises above 101 g. If you have ANY questions at all 12. Please use the crutches, knee scooter, or walker you have prescribed, rented, or purchased. If you are non-weight bearing DO NOT put weight on the operated foot for _________ days. If you are weight-bearing, follow your physician's instructions. You are expected to be: ? weight-bearing ?X non-weight bearing 13. Special Instructions: _____________________________________________________________ _________________________________________________________________________________ _________________________________________________________________________________  14. Your next appointment is: 10/21/2020 3:45  PM  If you need to reach the nurse for any reason, please call: [place]: [phone] [place]: [phone] [place]: [phone]   Do not take any Tylenol until after 3:30 pm today.     Post Anesthesia Home Care Instructions  Activity: Get plenty of rest for the remainder of the day. A responsible individual must stay with you for 24 hours following the procedure.  For the next 24 hours, DO NOT: -Drive a car -Paediatric nurse -Drink alcoholic beverages -Take any medication unless instructed by your physician -Make any legal decisions or sign important papers.  Meals: Start with liquid foods such as gelatin or soup. Progress to regular foods as tolerated. Avoid greasy, spicy, heavy foods. If nausea and/or vomiting occur, drink only clear liquids until the nausea and/or vomiting subsides. Call your physician if vomiting continues.  Special Instructions/Symptoms: Your throat may feel dry or sore from the anesthesia or the breathing tube placed in your throat during surgery. If this causes discomfort, gargle with warm salt water. The discomfort should disappear within 24 hours.  Regional Anesthesia Blocks  1. Numbness or the inability to move the "blocked" extremity may last from 3-48 hours after placement. The length of time depends on the medication injected and your individual response to the medication. If the numbness is not going away after 48 hours, call your surgeon.  2. The extremity that is blocked will need to be protected until the numbness is gone and the  Strength has returned. Because you cannot feel it, you will need to take extra care to avoid injury. Because it may be weak, you may have difficulty moving it or using it. You may not know what position it is in without looking at it while the  block is in effect.  3. For blocks in the legs and feet, returning to weight bearing and walking needs to be done carefully. You will need to wait until  the numbness is entirely gone and the strength has returned. You should be able to move your leg and foot normally before you try and bear weight or walk. You will need someone to be with you when you first try to ensure you do not fall and possibly risk injury.  4. Bruising and tenderness at the needle site are common side effects and will resolve in a few days.  5. Persistent numbness or new problems with movement should be communicated to the surgeon or the [place] [phone] [place] [phone]).

## 2020-10-15 NOTE — Progress Notes (Signed)
Assisted Dr. Daiva Huge with right, ultrasound guided, ankle block. Side rails up, monitors on throughout procedure. See vital signs in flow sheet. Tolerated Procedure well.

## 2020-10-17 DIAGNOSIS — M2042 Other hammer toe(s) (acquired), left foot: Secondary | ICD-10-CM

## 2020-10-17 DIAGNOSIS — M2011 Hallux valgus (acquired), right foot: Secondary | ICD-10-CM

## 2020-10-17 DIAGNOSIS — M2041 Other hammer toe(s) (acquired), right foot: Secondary | ICD-10-CM

## 2020-10-17 DIAGNOSIS — Q66211 Congenital metatarsus primus varus, right foot: Secondary | ICD-10-CM

## 2020-10-17 DIAGNOSIS — M2012 Hallux valgus (acquired), left foot: Secondary | ICD-10-CM

## 2020-10-17 NOTE — Brief Op Note (Signed)
10/15/2020  11:58 AM  PATIENT:  Tiffany Berg  65 y.o. female  PRE-OPERATIVE DIAGNOSIS:  RIGHT BUNION AND HAMMER TOES  POST-OPERATIVE DIAGNOSIS:  RIGHT BUNION AND HAMMER TOES  PROCEDURE:  Procedure(s) with comments: HALLUX VALGUS LAPIDUS AND BONE GRAFT (Right) - WITH IV SEDATION; REGIONAL BLOCK AIKEN OSTEOTOMY (Right) - REGIONAL BLOCK HAMMER TOE CORRECTION (Right) - REGIONAL BLOCK  SURGEON:  Surgeon(s) and Role:    * Corrion Stirewalt, Stephan Minister, DPM - Primary  PHYSICIAN ASSISTANT: none  ASSISTANTS: none   ANESTHESIA:   regional and MAC  EBL:  100 mL   BLOOD ADMINISTERED:none  DRAINS: none   LOCAL MEDICATIONS USED:  NONE  SPECIMEN:  No Specimen  DISPOSITION OF SPECIMEN:  N/A  COUNTS:  YES  TOURNIQUET:   Total Tourniquet Time Documented: Calf (Right) - 120 minutes Total: Calf (Right) - 120 minutes   DICTATION: .Note written in EPIC  PLAN OF CARE: Discharge to home after PACU  PATIENT DISPOSITION:  PACU - hemodynamically stable.   Delay start of Pharmacological VTE agent (>24hrs) due to surgical blood loss or risk of bleeding: yes

## 2020-10-17 NOTE — Op Note (Addendum)
Patient Name: Tiffany Berg DOB: [birth year]  MRN: [id number]   Date of Service: 10/15/2020  Surgeon: Dr. Lanae Crumbly, DPM Assistants: None Pre-operative Diagnosis:  1. Hallux valgus right foot 2. Metatarsus primus varus right foot 3. 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 Second hammertoe of right foot Post-operative Diagnosis:  1. Hallux valgus right foot 2. Metatarsus primus varus right foot 3. 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 Second hammertoe of right foot Procedures:  1) Lapidus bunionectomy right foot   2) Akin osteotomy right foot  3) correction hammertoe right second toe Pathology/Specimens: None Anesthesia: Monitored anesthesia care with ankle block Hemostasis:  Total Tourniquet Time Documented: Calf (Right) - 120 minutes Total: Calf (Right) - 120 minutes  Estimated Blood Loss: 100 mL Materials:  Implant Name Type Inv. Item Serial No. Manufacturer Lot No. LRB No. Used Action  4.0 x 37.5 Lag screw Screw   [employer]  Right 1 Implanted  [employer] Standard Lapidus plate    [employer]  Right 1 Implanted  [employer] 3.5 x 14 10CK Screw Screw   [employer]  Right 1 Implanted  SCREW 3.5X 16 NON LOCK - [id number] Screw SCREW 3.5X 16 NON [place]  Right 2 Implanted  SCREW LOCK 3.5X16 - [id number] Screw SCREW LOCK 3.5X16  [employer]  Right 1 Implanted  SCREW LOCK 3.5X22 - [id number] Screw SCREW LOCK 3.5X22  [employer]  Right 1 Implanted  SCREW MICA PROSTEP 3.0X22 - [id number] Screw SCREW MICA PROSTEP 3.0X22  [employer] [id number] Right 1 Implanted  0.045 Kwire Wire   [facility] [facility] SURGICAL INSTRUMENTS [id number] Right 1 Implanted  0.045 pin head    [employer] [id number] Right 1 Implanted   Medications: 6cc lidocaine plain 2% Complications: none  Indications for Procedure:  This is a 65 y.o. female with a history of  painful severe hallux valgus deformity and a painful second hammer toe. Having failed non surgical treatment for these she opted for operative intervention. All risks, benefits, and potential complications were discussed prior to surgery. Informed consent was signed and reviewed in the office and was available on the day of surgery for review.   Procedure in Detail: Patient was identified in pre-operative holding area. Formal consent was signed and the right lower extremity was marked. She underwent pre operative ankle block by the anesthesiologist. Patient was brought back to the operating room. Anesthesia was induced. The extremity was prepped and draped in the usual sterile fashion. Timeout was taken to confirm patient name, laterality, and procedure prior to incision.   Attention was then directed to the left foot where a dorsomedial incision was made over the first tarsometatarsal joint.  This was placed medial to the extensor hallucis longus tendon.  Dissection was carried through subcutaneous tissues, ensuring that all vital neurovascular structures were protected throughout the procedure.  Bleeders were cauterized as necessary.  The deep fascia was incised and the extensor hallucis longus tendon was retracted laterally. I first carried dissection distally in to the first interspace. Blunt dissection was used to expose the lateral capsule and conjoined tendon of the adductor hallucis. This was isolated, cut from its insertion at the lateral sesamoid and the fibular sesamoidal suspensory ligament was incised and released. This allowed mobilization and reduction of the abduction deformity. Proximal dissection was continued, the periosteum was incised and reflected over the first tarsometatarsal joint capsule and ligaments were incised and  the joint was exposed.  An osteotome was used to free plantar ligamentous attachments of the joint.  2.5 mm Steinmann pins were then placed into the medial cuneiform and  base of the first metatarsal and a distractor was placed to expose the joint.  The articular surface of the medial cuneiform and base of the first metatarsal was then debrided and resected using accommodation of osteotomes and curettes.  The subchondral bone plate was left intact.  This was then fenestrated using a 2.5 mm drill once all articular cartilage had been thoroughly resected.  I then directed my attention to the lateral heel where a 1 cm incision was made 2 cm anterior to the posterior border of the heel and 2 cm proximal to the glabrous junction.  A hemostat and freer elevator was used to elevate the soft tissues off the lateral wall the calcaneus.  An 28mm Acumed reamer was used to obtain approximately 1cc autogenous cancellous bone graft following puncture of the lateral wall with the trocar.  This bone graft was taken and placed into the arthrodesis site of the first tarsometatarsal joint.  The calcaneal incision was then irrigated and closed with 3-0 nylon.     Blunt dissection was used to place the lateral portion of the jig for the [place] via the distal first interspace.  I then made an incision over the medial first metatarsal head which was carried down to the medial capsule.  The proper digital branch of the medial dorsal cutaneous nerve was identified and retracted.  The capsule of the first metatarsal head was reflected from the bone and the medial portion of the Lapifuse jig was placed with a 1.3 mm Kirschner wire.  The intermetatarsal angle was then reduced and valgus position of the first ray was also corrected manually and held into position with the Lapifuse jig.  Fluoroscopy confirmed adequate reduction of the deformity.   The jig was held in place and then I began fixation of the first tarsometatarsal arthrodesis.  A guidewire for the 4.0 mm partially-threaded interfragmentary screw was then placed from the plantar medial base of the first metatarsal and advanced  proximally and dorsally into the intermediate cuneiform under fluoroscopy.  The screw was then drilled and advanced with good compression of the arthrodesis site noted.  The dorsal medial plate was then selected and positioned and secured using a combination of locking and nonlocking screws to provide good apposition to the bone.  Additional compression was gained through the compression slot and the interfragmentary screw was then retightened.  Residual interphalangeal abduction remained and I decided to perform an Akin osteotomy. An oblique closing wedge osteotomy was created via a small medial incision over the distal portion of the proximal 1st phalanx. The osteotomy was created with a [facility] 25mm ProStep burr. A guidewire for a 3.5mm ProStep screw was advanced perpendicular to this from proximal medial to distal lateral. The screw was drilled and inserted, and good compression of the osteotomy with correction of the deformity under fluoroscopy was noted.  Attention was then directed to the second digit where a semi elliptical incision was made over the proximal interphalangeal joint.  This was carried deep to expose the extensor digitorum longus tendon and a transverse tenotomy was made.  The PIPJ was exposed and the collateral ligaments were released.  The articular surface of the head of the proximal phalanx and the base of the middle phalanx was then resected using the 2.4mm burr.  A 0.045  inch Kirschner wire was then inserted through the base of the middle phalanx through the distal phalanx and out the tip of the toe just under the nail.  This was then driven retrograde to the level of the base of the proximal phalanx.  Good stability of the osteotomy site was noted clinically and fluoroscopically with adequate reduction of the deformity.   Final films were then taken with a satisfactory result in correction of the deformity.  The wound was then thoroughly irrigated with normal sterile saline.   The incisions were then closed using 2-0 vicryl, 3-0 Monocryl and 3-0 nylon.  She was placed into a well padded below the knee splint.  Patient tolerated the procedure well.  All counts were correct and operative procedure.  She was then aroused arise from anesthesia and transferred back to the recovery area in good condition.   Disposition: Following a period of post-operative monitoring, patient will be transferred to home.

## 2020-10-18 ENCOUNTER — Encounter (HOSPITAL_BASED_OUTPATIENT_CLINIC_OR_DEPARTMENT_OTHER): Payer: Self-pay | Admitting: Podiatry

## 2020-10-21 ENCOUNTER — Ambulatory Visit (INDEPENDENT_AMBULATORY_CARE_PROVIDER_SITE_OTHER): Payer: Medicare Other | Admitting: Podiatry

## 2020-10-21 ENCOUNTER — Other Ambulatory Visit: Payer: Self-pay

## 2020-10-21 DIAGNOSIS — M2012 Hallux valgus (acquired), left foot: Secondary | ICD-10-CM | POA: Diagnosis not present

## 2020-10-21 DIAGNOSIS — M21612 Bunion of left foot: Secondary | ICD-10-CM | POA: Diagnosis not present

## 2020-10-21 DIAGNOSIS — Z9889 Other specified postprocedural states: Secondary | ICD-10-CM

## 2020-10-21 MED ORDER — OXYCODONE HCL 5 MG PO TABS: 5.0000 mg | ORAL_TABLET | ORAL | 0 refills | Status: AC | PRN

## 2020-10-22 ENCOUNTER — Encounter: Payer: Medicare Other | Admitting: Podiatry

## 2020-10-24 ENCOUNTER — Encounter: Payer: Self-pay | Admitting: Podiatry

## 2020-10-24 NOTE — Progress Notes (Signed)
  Subjective:  Patient ID: Tiffany Berg, female    DOB: 1956/04/15,  MRN: [id number]  Chief Complaint  Patient presents with  . Routine Post Op    POV #1 DOS 10/15/2020 RT FOOT BUNION CORRECTION W/FUSION OF 1ST TARSOMETATARSAL JOINT(LAPIDUS PROCEDURE) POSS BONE CUT IN BIG TOE, SECOND HAMMERTOE CORRECTION, BONE GRAFT FROM HEEL    DOS: 10/15/2020 Procedure: Lapidus bunionectomy with calcaneal autograft, second hammertoe correction  65 y.o. female returns for post-op check.  Doing well.  Her pain has been improving.  Here with her daughter as well as a Optometrist from [place] to [place]  Review of Systems: Negative except as noted in the HPI. Denies N/V/F/Ch.   Objective:  There were no vitals filed for this visit. There is no height or weight on file to calculate BMI. Constitutional Well developed. Well nourished.  Vascular Foot warm and well perfused. Capillary refill normal to all digits.   Neurologic Normal speech. Oriented to person, place, and time. Epicritic sensation to light touch grossly present bilaterally.  Dermatologic Skin healing well without signs of infection. Skin edges well coapted without signs of infection.  There is a small hemorrhagic bulla over the second and third MTPJ's  Orthopedic: Tenderness to palpation noted about the surgical site.   Assessment:  No diagnosis found. Plan:  Patient was evaluated and treated and all questions answered.  S/p foot surgery left -Progressing as expected post-operatively. -Following sterile prep of Betadine the hemorrhagic bulla was lanced and drained and skin was left intact -XR: We were not able to take x-rays today as our x-ray machine is not working currently.  We will take them at next visit -WB Status: NWB in CAM boot which I dispensed today -Sutures: We will leave intact for 2 more weeks. -Medications: Refill of oxycodone 5 mg sent to her.  We reviewed pain medication regimen currently.  Encouraged ice and  elevation. -Foot redressed.  No follow-ups on file.

## 2020-10-25 ENCOUNTER — Other Ambulatory Visit: Payer: Self-pay

## 2020-10-25 ENCOUNTER — Telehealth: Payer: Self-pay | Admitting: Podiatry

## 2020-10-25 MED ORDER — LOVASTATIN 40 MG PO TABS: 80.0000 mg | ORAL_TABLET | Freq: Every day | ORAL | 1 refills | Status: DC

## 2020-10-25 NOTE — Telephone Encounter (Signed)
Can you schedule her for 11:45 for me tomorrow? I can take a look at it. I spoke with the daughter and she said this would work for her

## 2020-10-25 NOTE — Telephone Encounter (Signed)
Daughter called and stated that her mother Tiffany Berg has developed a blister next to her sx site and she wanted to know what should be done. Please call patients ASAP

## 2020-10-26 ENCOUNTER — Ambulatory Visit (INDEPENDENT_AMBULATORY_CARE_PROVIDER_SITE_OTHER): Payer: Medicare Other | Admitting: Podiatry

## 2020-10-26 ENCOUNTER — Encounter: Payer: Self-pay | Admitting: Podiatry

## 2020-10-26 ENCOUNTER — Other Ambulatory Visit: Payer: Self-pay

## 2020-10-26 DIAGNOSIS — M2041 Other hammer toe(s) (acquired), right foot: Secondary | ICD-10-CM

## 2020-10-26 DIAGNOSIS — Z9889 Other specified postprocedural states: Secondary | ICD-10-CM

## 2020-10-26 DIAGNOSIS — M21611 Bunion of right foot: Secondary | ICD-10-CM

## 2020-10-26 DIAGNOSIS — M2012 Hallux valgus (acquired), left foot: Secondary | ICD-10-CM

## 2020-10-26 DIAGNOSIS — M21612 Bunion of left foot: Secondary | ICD-10-CM

## 2020-10-26 DIAGNOSIS — M2042 Other hammer toe(s) (acquired), left foot: Secondary | ICD-10-CM

## 2020-10-26 DIAGNOSIS — M2011 Hallux valgus (acquired), right foot: Secondary | ICD-10-CM

## 2020-10-26 MED ORDER — AMOXICILLIN-POT CLAVULANATE 875-125 MG PO TABS: 1.0000 | ORAL_TABLET | Freq: Two times a day (BID) | ORAL | 0 refills | Status: DC

## 2020-10-26 NOTE — Telephone Encounter (Signed)
Scheduled patient, I also called and lvm for patients daughter to confirm appointment time

## 2020-10-26 NOTE — Telephone Encounter (Signed)
Thank you :)

## 2020-10-28 ENCOUNTER — Encounter: Payer: Self-pay | Admitting: Podiatry

## 2020-10-28 NOTE — Progress Notes (Signed)
  Subjective:  Patient ID: Tiffany Berg, female    DOB: 10/18/1955,  MRN: [id number]  Chief Complaint  Patient presents with  . Routine Post Op    Blister     DOS: 10/15/2020 Procedure: Lapidus bunionectomy with calcaneal autograft, second hammertoe correction  65 y.o. female returns for post-op check. She returns on a somewhat urgent basis because the blister has returned and they were concerning.  Review of Systems: Negative except as noted in the HPI. Denies N/V/F/Ch.   Objective:  There were no vitals filed for this visit. There is no height or weight on file to calculate BMI. Constitutional Well developed. Well nourished.  Vascular Foot warm and well perfused. Capillary refill normal to all digits.   Neurologic Normal speech. Oriented to person, place, and time. Epicritic sensation to light touch grossly present bilaterally.  Dermatologic Skin healing well without signs of infection. Skin edges well coapted without signs of infection.  There is a small hemorrhagic bulla over the second and third MTPJ's, has refilled with primarily serous fluid  Orthopedic: Tenderness to palpation noted about the surgical site.   Assessment:   1. Hallux valgus with bunions, left   2. Post-operative state   3. Hallux valgus with bunions, right   4. Hammertoe of left foot   5. Hammertoe of right foot    Plan:  Patient was evaluated and treated and all questions answered.  S/p foot surgery left -Progressing as expected post-operatively. -Following sterile prep of Betadine the hemorrhagic bulla was again lanced and drained and skin was left intact, I made a larger incision this time to allow to continue to drain -Continue to be NWB in the CAM boot -Sterile postop dressing applied compression applied  No follow-ups on file.

## 2020-11-01 ENCOUNTER — Telehealth: Payer: Self-pay | Admitting: Podiatry

## 2020-11-01 ENCOUNTER — Other Ambulatory Visit: Payer: Self-pay

## 2020-11-01 ENCOUNTER — Other Ambulatory Visit: Payer: Self-pay | Admitting: Family Medicine

## 2020-11-01 ENCOUNTER — Encounter: Payer: Self-pay | Admitting: Podiatry

## 2020-11-01 ENCOUNTER — Ambulatory Visit (INDEPENDENT_AMBULATORY_CARE_PROVIDER_SITE_OTHER): Payer: Medicare Other | Admitting: Podiatry

## 2020-11-01 DIAGNOSIS — Z9889 Other specified postprocedural states: Secondary | ICD-10-CM

## 2020-11-01 DIAGNOSIS — M2011 Hallux valgus (acquired), right foot: Secondary | ICD-10-CM

## 2020-11-01 DIAGNOSIS — E039 Hypothyroidism, unspecified: Secondary | ICD-10-CM

## 2020-11-01 DIAGNOSIS — M21611 Bunion of right foot: Secondary | ICD-10-CM

## 2020-11-01 DIAGNOSIS — M2041 Other hammer toe(s) (acquired), right foot: Secondary | ICD-10-CM

## 2020-11-01 MED ORDER — OXYCODONE HCL 5 MG PO TABS: 5.0000 mg | ORAL_TABLET | Freq: Four times a day (QID) | ORAL | 0 refills | Status: DC | PRN

## 2020-11-01 NOTE — Telephone Encounter (Signed)
Patient called inquiring about med's that were prescribed today during visit, Please Advise

## 2020-11-01 NOTE — Progress Notes (Signed)
  Subjective:  Patient ID: Tyrea Froberg, female    DOB: 12-01-1955,  MRN: [id number]  Chief Complaint  Patient presents with  . Routine Post Op    Post op pt stated that she is still having some pain     DOS: 10/15/2020 Procedure: Lapidus bunionectomy with calcaneal autograft, second hammertoe correction  65 y.o. female returns for post-op check.  Here with her daughter again.  She is still having pain that requires narcotic pain medication.  Review of Systems: Negative except as noted in the HPI. Denies N/V/F/Ch.   Objective:  There were no vitals filed for this visit. There is no height or weight on file to calculate BMI. Constitutional Well developed. Well nourished.  Vascular Foot warm and well perfused. Capillary refill normal to all digits.   Neurologic Normal speech. Oriented to person, place, and time. Epicritic sensation to light touch grossly present bilaterally.  Dermatologic Skin healing well without signs of infection. Skin edges well coapted without signs of infection.  Hemorrhagic bulla has healed and coalesced.  Mild serous blister around the proximal incision  Orthopedic: Tenderness to palpation noted about the surgical site.   Assessment:   1. Hallux valgus with bunions, right   2. Hammertoe of right foot   3. Post-operative state    Plan:  Patient was evaluated and treated and all questions answered.  S/p foot surgery left -Progressing as expected post-operatively. -Sutures removed today -Incision painted with Betadine -Continue to be NWB in the CAM boot -She may begin bathing, no soaking or scrubbing.  Apply Neosporin ointment around pin sites along incision before and after bathing  -X-ray at next visit, will pull Kirschner wire and begin weightbearing in boot  Return in 17 days (on 11/18/2020) for new x-ray, pull pin.

## 2020-11-01 NOTE — Telephone Encounter (Signed)
I sent it  ?Thanks ?

## 2020-11-07 IMAGING — DX DG CHEST 1V PORT
1 series · 1 of 1 positions shown · non-contrast
Comparison: None.

CLINICAL DATA: Shortness of breath

EXAM:
PORTABLE CHEST 1 VIEW

[chest ap]
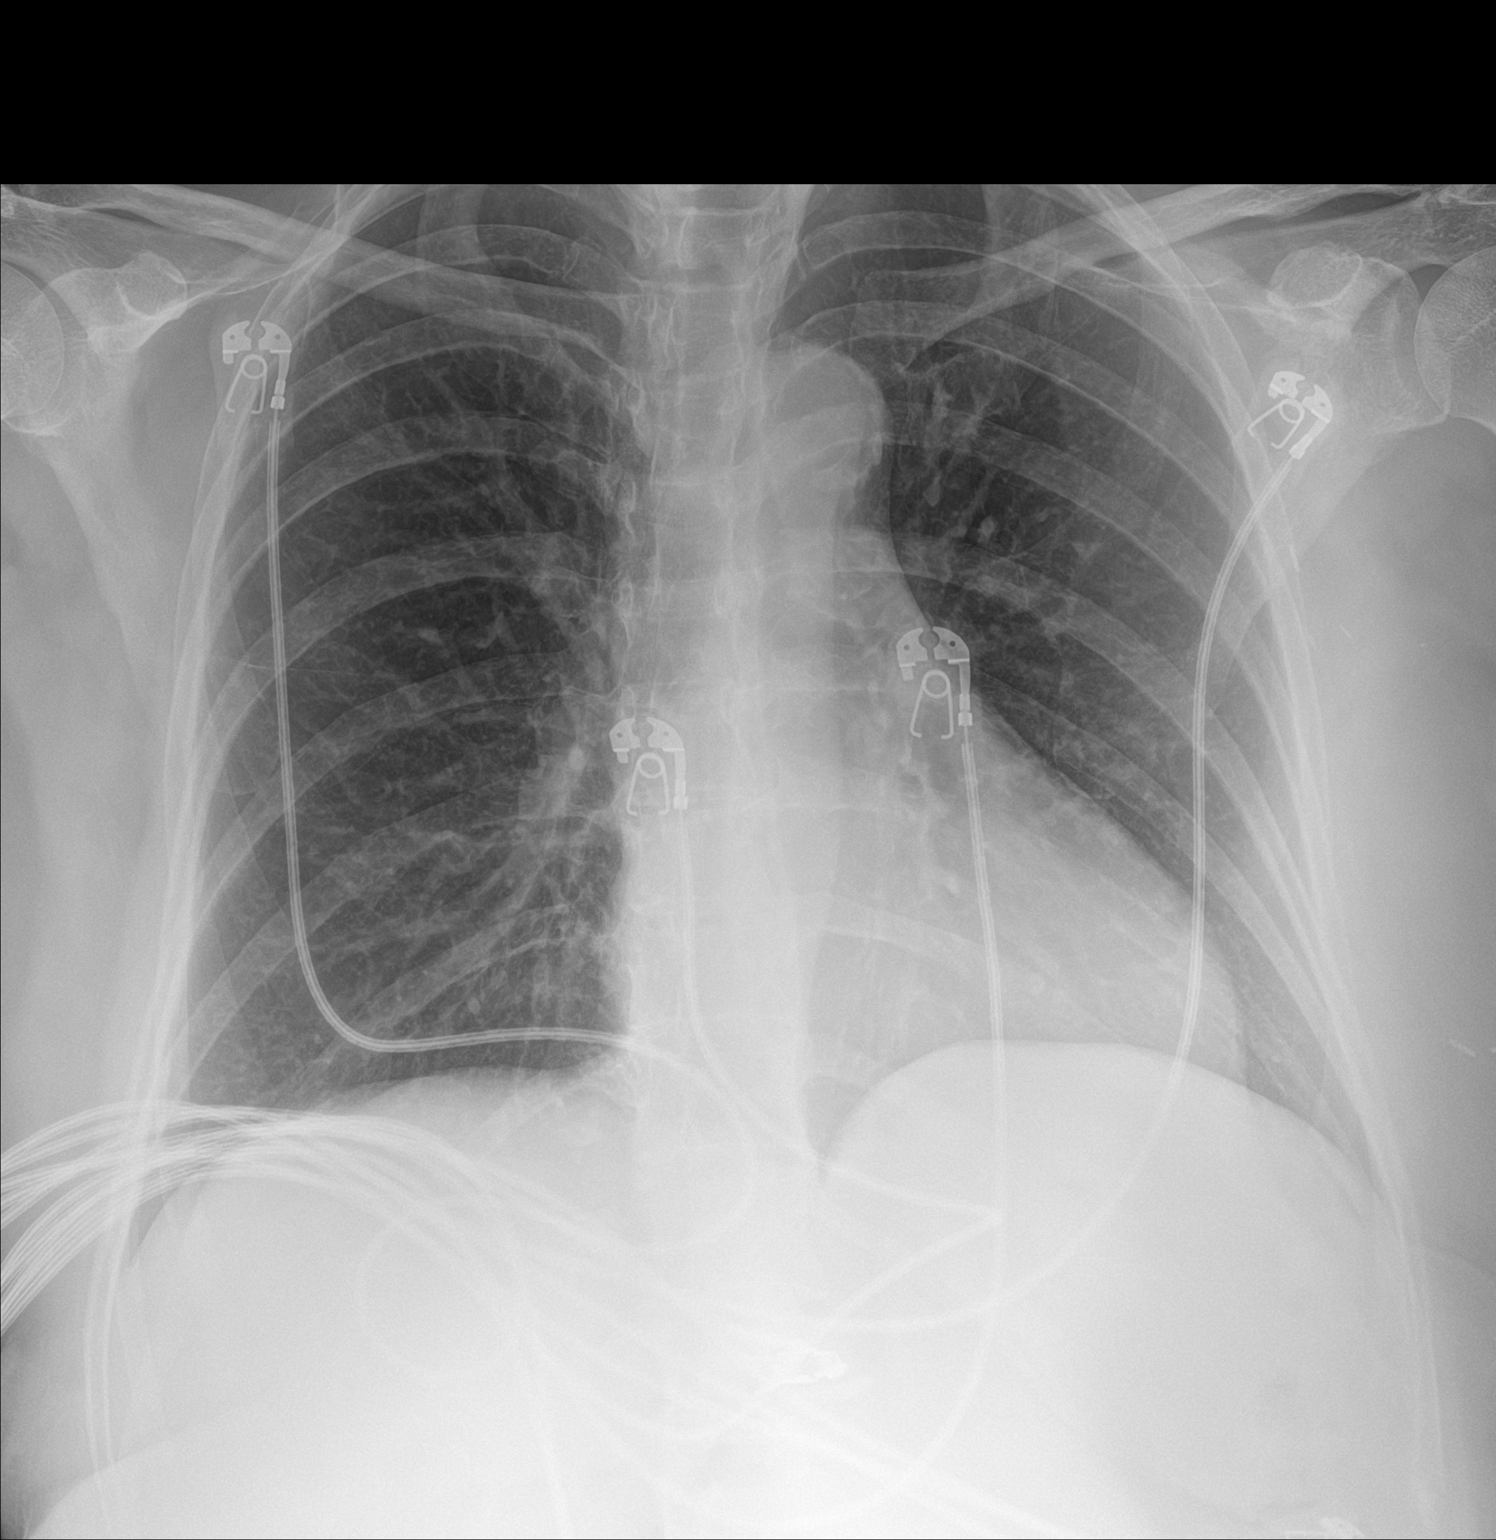

[1 of 1 positions shown; findings below may reference images not displayed]

FINDINGS: The heart size and mediastinal contours are within normal limits.
Aortic knob calcifications. Both lungs are clear. The visualized
skeletal structures are unremarkable.
IMPRESSION: No acute cardiopulmonary process.

## 2020-11-07 IMAGING — CT CT HEAD W/O CM
4 series · 16 of 47 positions shown, 18 images · non-contrast
Comparison: None.

CLINICAL DATA: Headaches and right-sided weakness on going for days

EXAM:
CT HEAD WITHOUT CONTRAST
TECHNIQUE: Contiguous axial images were obtained from the base of the skull
through the vertex without intravenous contrast.

[Series 3: head wo · axial · 0.40mm/px · z∈[-70,+50]mm · 7 of 32 slices shown, 9 images]
[im 4/32  brain]
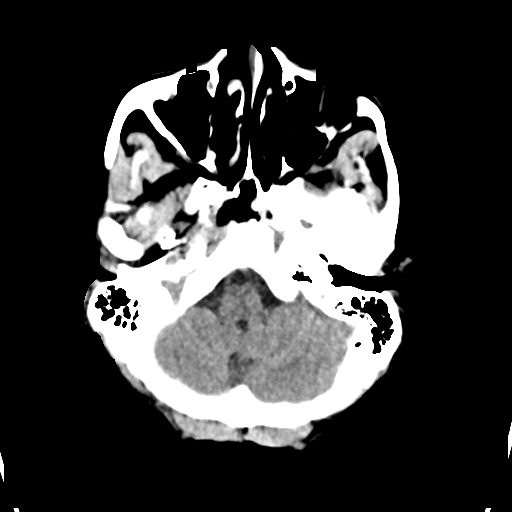
[im 4/32  bone]
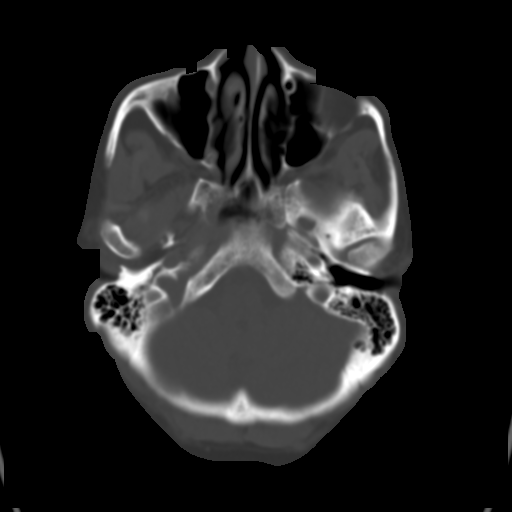
[im 8/32  brain]
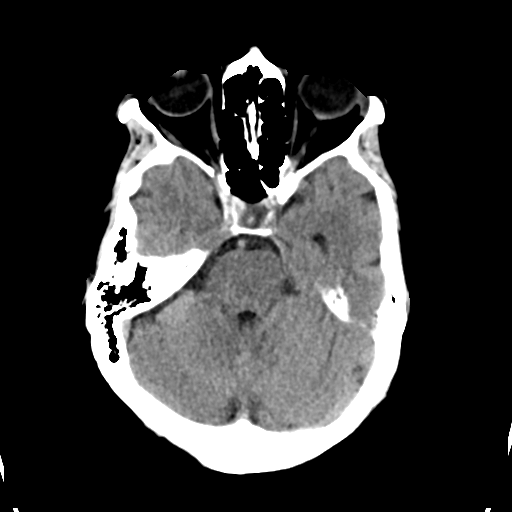
[im 12/32  brain]
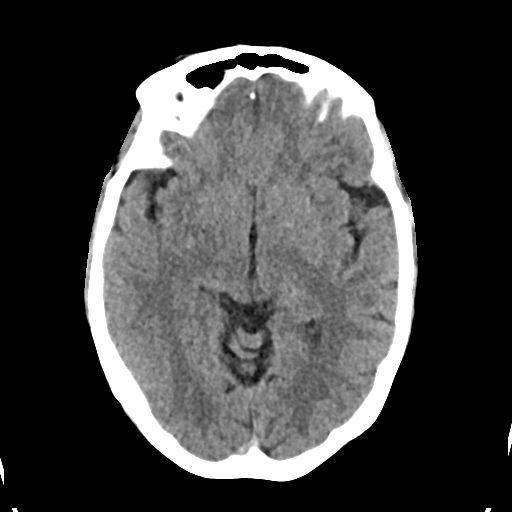
[im 16/32  brain]
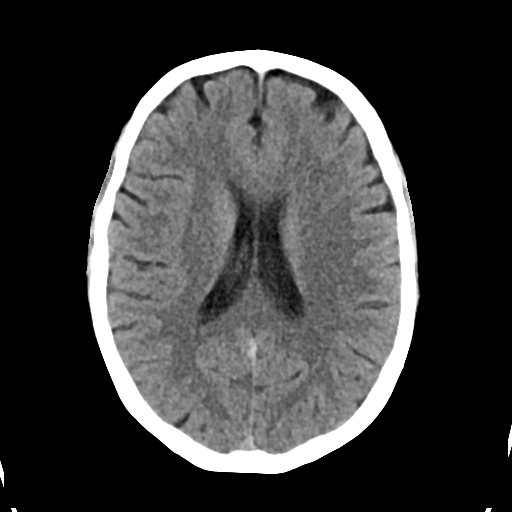
[im 20/32  brain]
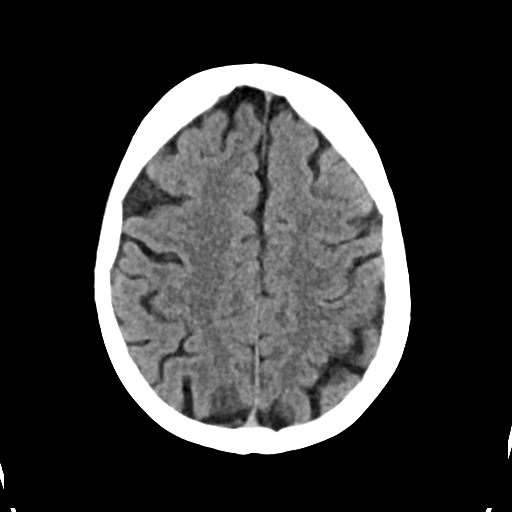
[im 20/32  bone]
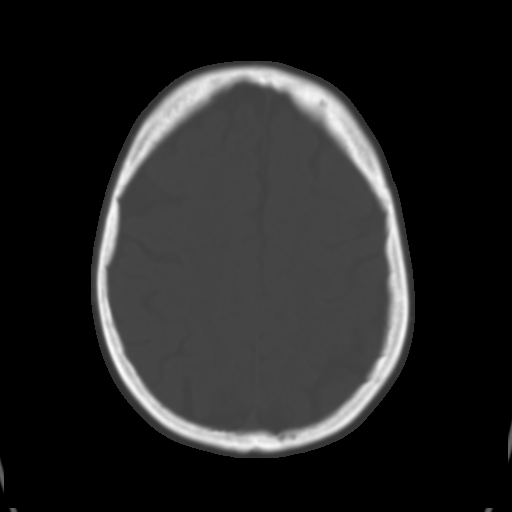
[im 24/32  brain]
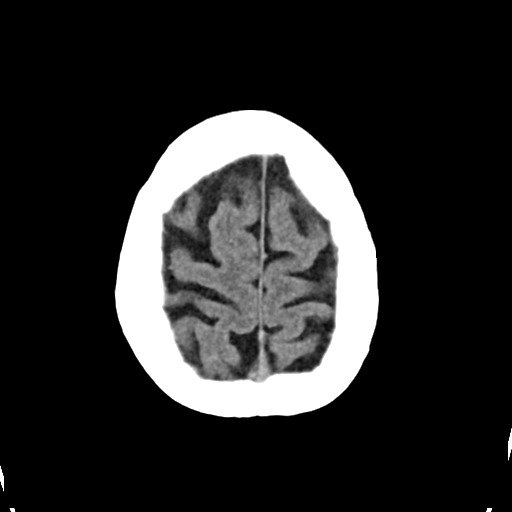
[im 28/32  brain]
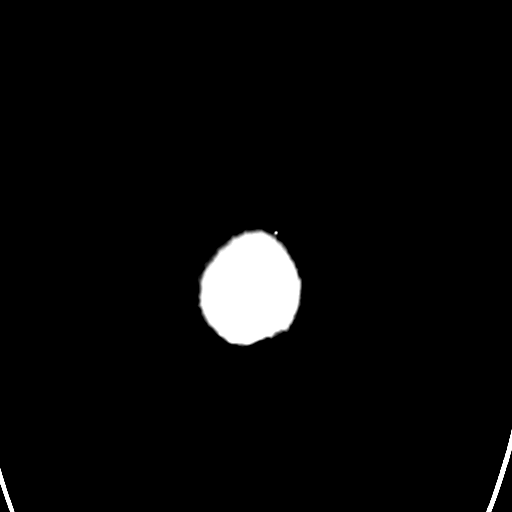

[Series 4: head bone · axial · 0.40mm/px · z∈[-71,-39]mm · 3 of 79 slices shown]
[im 8/79  bone]
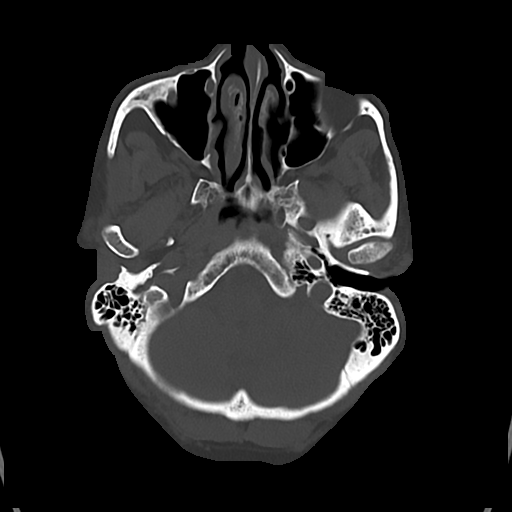
[im 16/79  bone]
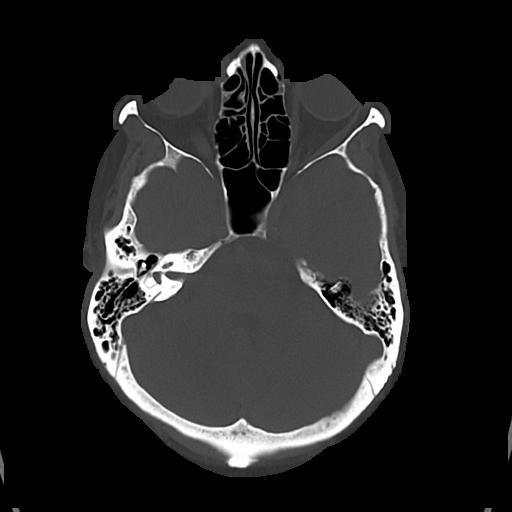
[im 24/79  bone]
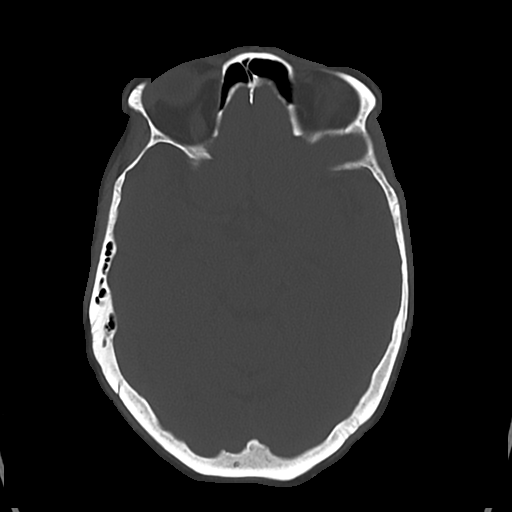

[Series 5: cor soft · coronal · 0.29mm/px · 3 of 64 slices shown]
[im 22/64  brain]
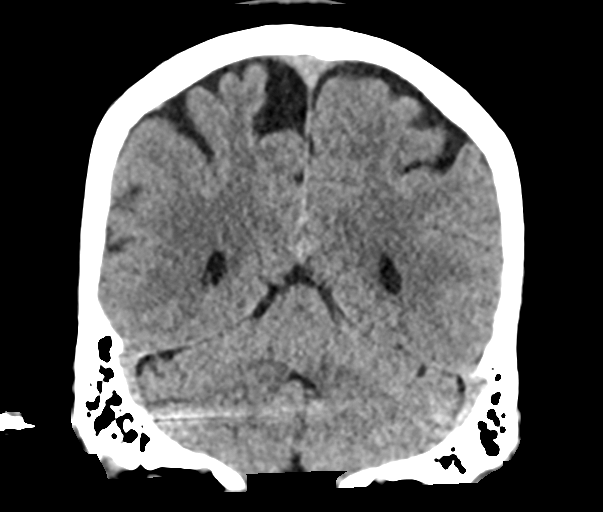
[im 29/64  brain]
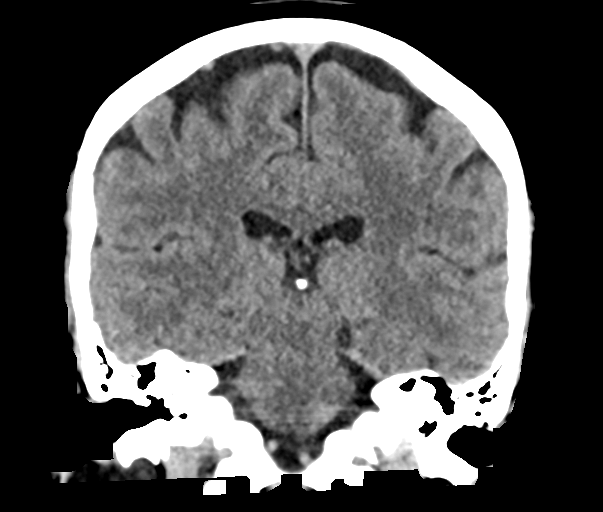
[im 36/64  brain]
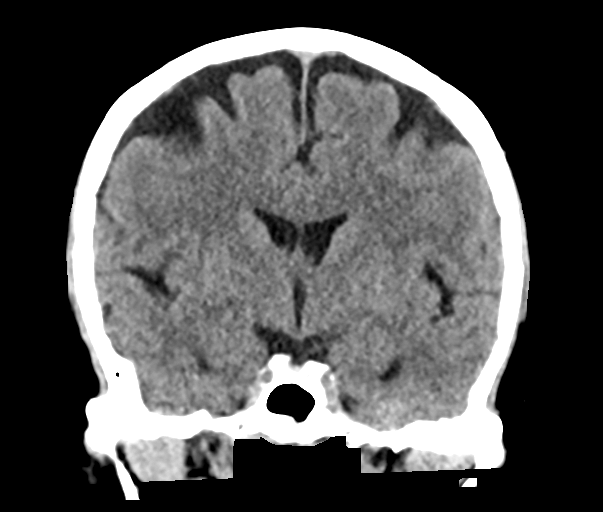

[Series 6: sag soft · sagittal · 0.31mm/px · 3 of 51 slices shown]
[im 17/51  brain]
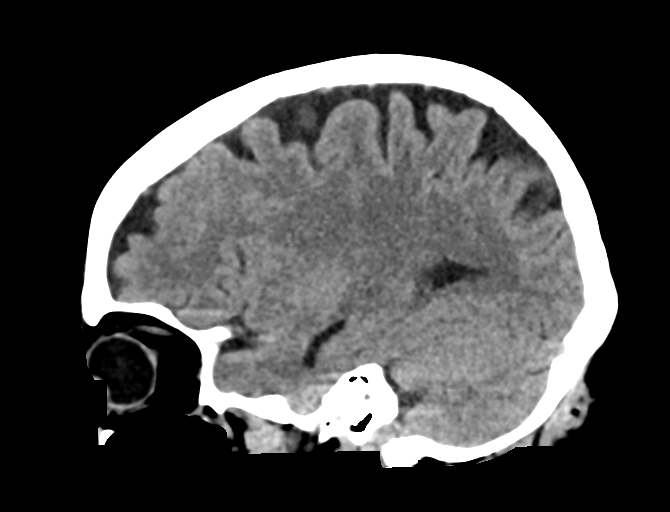
[im 26/51  brain]
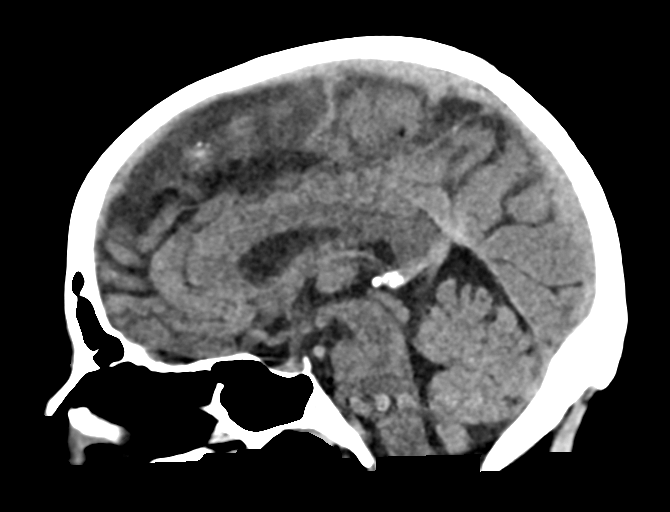
[im 34/51  brain]
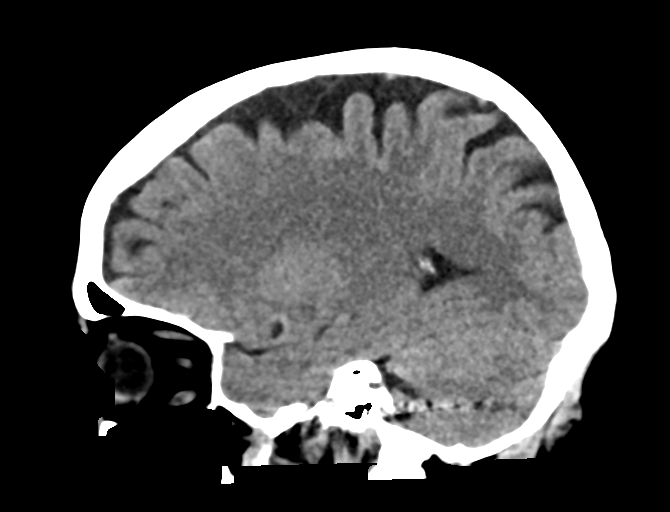

[16 of 47 positions shown; findings below may reference images not displayed]

FINDINGS: Brain: No evidence of acute infarction, hemorrhage, hydrocephalus,
extra-axial collection or mass lesion/mass effect.

Vascular: There are vascular calcifications in the carotid siphons.

Skull: Normal. Negative for fracture or focal lesion.

Sinuses/Orbits: No acute finding.

Other: None.
IMPRESSION: No acute intracranial process.

## 2020-11-08 ENCOUNTER — Other Ambulatory Visit: Payer: Self-pay | Admitting: Podiatry

## 2020-11-08 MED ORDER — OXYCODONE HCL 5 MG PO TABS: 5.0000 mg | ORAL_TABLET | Freq: Four times a day (QID) | ORAL | 0 refills | Status: AC | PRN

## 2020-11-08 NOTE — Progress Notes (Signed)
Pt daughter called for pain med refill; sent to pharmacy

## 2020-11-11 ENCOUNTER — Encounter: Payer: Medicare Other | Admitting: Podiatry

## 2020-11-12 ENCOUNTER — Encounter: Payer: Medicare Other | Admitting: Podiatry

## 2020-11-15 NOTE — Progress Notes (Unsigned)
Subjective:   Tiffany Berg is a 65 y.o. female who presents for an Initial Medicare Annual Wellness Visit.  Review of Systems    N/A        Objective:    There were no vitals filed for this visit. There is no height or weight on file to calculate BMI.  Advanced Directives 10/15/2020 03/12/2020 11/14/2019 02/19/2015 02/17/2015  Does Patient Have a Medical Advance Directive? No No No No No  Would patient like information on creating a medical advance directive? Yes (MAU/Ambulatory/Procedural Areas - Information given) No - Patient declined Yes (Inpatient - patient defers creating a medical advance directive at this time - Information given) Yes - Educational materials given Yes - Educational materials given    Current Medications (verified) Outpatient Encounter Medications as of 11/16/2020  Medication Sig  . amoxicillin-clavulanate (AUGMENTIN) 875-125 MG tablet Take 1 tablet by mouth 2 (two) times daily.  . B Complex Vitamins (VITAMIN B COMPLEX PO) Take by mouth.  . chlorhexidine (PERIDEX) 0.12 % solution SMARTSIG:By Mouth  . EUTHYROX 75 MCG tablet TAKE 1 TABLET BY MOUTH ONCE DAILY BEFORE BREAKFAST  . lovastatin (MEVACOR) 40 MG tablet Take 2 tablets (80 mg total) by mouth at bedtime.  . Misc. Devices (CRUTCH HANDGRIPS) MISC 1 Units by Does not apply route as directed. To use after foot surgery.  Marland Kitchen oxyCODONE (OXY IR/ROXICODONE) 5 MG immediate release tablet Take 1 tablet (5 mg total) by mouth every 6 (six) hours as needed for up to 7 days for severe pain.  Marland Kitchen tiZANidine (ZANAFLEX) 2 MG tablet Take 1-2 tablets (2-4 mg total) by mouth every 6 (six) hours as needed for muscle spasms.   No facility-administered encounter medications on file as of 11/16/2020.    Allergies (verified) Patient has no known allergies.   History: Past Medical History:  Diagnosis Date  . 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 Depression    undiagnosed- pt feels like crying sometimes for no reason  . Ductal carcinoma in situ (DCIS) of left breast oncologist--  dr c. Faythe Ghee Deanne Coffer)   dx 10/ 2014  DCIS left breast , ER/PR+;  07-22-2013 s/p  left breast partial mastectomy w/ sln dissection's and  08-12-2013 s/p left breast lumpecotmy (excision another site);  completed radiation 02/ 2015  . Headache   . Hyperlipidemia   . Hypothyroidism   . Mobitz type 2 second degree heart block 03/12/2020   w/ symptomatic bradycardia, rate 30s;  s/p  PPM 03-14-2020  . Pre-diabetes   . Symptomatic bradycardia 03/12/2020  . Wears glasses   . Wears partial dentures    upper and lower   Past Surgical History:  Procedure Laterality Date  . Barbie Banner OSTEOTOMY Right 10/15/2020   Procedure: [employer];  Surgeon: Criselda Peaches, DPM;  Location: [facility];  Service: Podiatry;  Laterality: Right;  REGIONAL BLOCK  . ANTERIOR AND POSTERIOR REPAIR N/A 02/18/2015   Procedure: ANTERIOR (CYSTOCELE) AND POSTERIOR REPAIR (RECTOCELE);  Surgeon: Everett Graff, MD;  Location: [facility];  Service: Gynecology;  Laterality: N/A;  . BLADDER SUSPENSION N/A 02/18/2015   Procedure: TRANSVAGINAL TAPE (TVT) PROCEDURE;  Surgeon: Everett Graff, MD;  Location: [facility];  Service: Gynecology;  Laterality: N/A;  . CYSTOSCOPY Bilateral 02/18/2015   Procedure: CYSTOSCOPY;  Surgeon: Everett Graff, MD;  Location: [facility];  Service: Gynecology;  Laterality: Bilateral;  . HALLUX VALGUS LAPIDUS Right 10/15/2020   Procedure: HALLUX VALGUS LAPIDUS AND BONE GRAFT;  Surgeon: Criselda Peaches, DPM;  Location: [facility];  Service: Podiatry;  Laterality: Right;  WITH IV SEDATION; REGIONAL BLOCK  . HAMMER TOE SURGERY Right 10/15/2020   Procedure: HAMMER TOE CORRECTION;  Surgeon: Criselda Peaches, DPM;  Location: [facility];   Service: Podiatry;  Laterality: Right;  REGIONAL BLOCK  . LAPAROSCOPIC ASSISTED VAGINAL HYSTERECTOMY N/A 02/18/2015   Procedure: LAPAROSCOPIC ASSISTED VAGINAL HYSTERECTOMY;  Surgeon: Everett Graff, MD;  Location: [facility];  Service: Gynecology;  Laterality: N/A;  . MASTECTOMY, PARTIAL Left 08-12-2013  @[facility]    excision another site  . PACEMAKER IMPLANT N/A 03/14/2020   Procedure: PACEMAKER IMPLANT;  Surgeon: Constance Haw, MD;  Location: [facility];  Service: Cardiovascular;  Laterality: N/A;  . RADIOACTIVE SEED GUIDED PARTIAL MASTECTOMY/AXILLARY SENTINEL NODE BIOPSY/AXILLARY NODE DISSECTION Left 07-22-2013  @[facility]   . SALPINGOOPHORECTOMY Bilateral 02/18/2015   Procedure: BILATERAL SALPINGO OOPHORECTOMY;  Surgeon: Everett Graff, MD;  Location: [facility];  Service: Gynecology;  Laterality: Bilateral;   Family History  Problem Relation Age of Onset  . Heart disease Mother   . Cancer Father        prostata  . Cancer Sister 74       Breast   Social History   Socioeconomic History  . Marital status: Legally Separated    Spouse name: Not on file  . Number of children: Not on file  . Years of education: Not on file  . Highest education level: Not on file  Occupational History  . Not on file  Tobacco Use  . Smoking status: Never Smoker  . Smokeless tobacco: Never Used  Vaping Use  . Vaping Use: Never used  Substance and Sexual Activity  . Alcohol use: Yes    Comment: socially  . Drug use: Never  . Sexual activity: Yes    Birth control/protection: Post-menopausal, Surgical  Other Topics Concern  . Not on file  Social History Narrative  . Not on file   Social Determinants of Health   Financial Resource Strain: Not on file  Food Insecurity: Not on file  Transportation Needs: Not on file  Physical Activity: Not on file  Stress: Not on file  Social Connections: Not on file    Tobacco Counseling Counseling given: Not Answered   Clinical Intake:                  Diabetic?No          Activities of Daily Living In your present state of health, do you have any difficulty performing the following activities: 10/15/2020 03/13/2020  Hearing? N N  Vision? N N  Difficulty concentrating or making decisions? N N  Walking or climbing stairs? N N  Dressing or bathing? N N  Doing errands, shopping? - N  Some recent data might be hidden    Patient Care Team: Martinique, Betty G, MD as PCP - General (Family Medicine) Jerline Pain, MD as PCP - Cardiology (Cardiology) Constance Haw, MD as PCP - Electrophysiology (Cardiology)  Indicate any recent Medical Services you may have received from other than [facility] providers in the past year (date may be approximate).     Assessment:   This is a routine wellness examination for Tiffany Berg.  Hearing/Vision screen No exam data present  Dietary issues and exercise activities discussed:  Goals   None    Depression Screen PHQ 2/9 Scores 09/02/2020 08/20/2019  PHQ - 2 Score 0 0    Fall Risk Fall Risk  09/29/2020 08/20/2019  Falls in the past year? 0 1  Number falls in past yr: - 0  Injury with Fall? - 0  Risk for fall due to : - History of fall(s)  Follow up - Education provided    [place]:  Any stairs in or around the home? {YES/NO:21197} If so, are there any without handrails? No  Home free of loose throw rugs in walkways, pet beds, electrical cords, etc? Yes  Adequate lighting in your home to reduce risk of falls? Yes   ASSISTIVE DEVICES UTILIZED TO PREVENT FALLS:  Life alert? {YES/NO:21197} Use of a cane, walker or w/c? {YES/NO:21197} Grab bars in the bathroom? {YES/NO:21197} Shower chair or bench in shower? {YES/NO:21197} Elevated toilet seat or a handicapped toilet? {YES/NO:21197}    Cognitive Function:        Immunizations Immunization History  Administered Date(s) Administered  . Fluad Quad(high Dose 65+) 08/20/2019  .  Influenza, Seasonal, Injecte, Preservative Fre 07/16/2012  . Influenza-Unspecified 06/17/2013  . Pneumococcal Polysaccharide-23 08/20/2019    TDAP status: Due, Education has been provided regarding the importance of this vaccine. Advised may receive this vaccine at local pharmacy or Health Dept. Aware to provide a copy of the vaccination record if obtained from local pharmacy or Health Dept. Verbalized acceptance and understanding.  Flu Vaccine status: Due, Education has been provided regarding the importance of this vaccine. Advised may receive this vaccine at local pharmacy or Health Dept. Aware to provide a copy of the vaccination record if obtained from local pharmacy or Health Dept. Verbalized acceptance and understanding.  Pneumococcal vaccine status: Due, Education has been provided regarding the importance of this vaccine. Advised may receive this vaccine at local pharmacy or Health Dept. Aware to provide a copy of the vaccination record if obtained from local pharmacy or Health Dept. Verbalized acceptance and understanding.  {Covid-19 vaccine status:2101808}  Qualifies for Shingles Vaccine? Yes   Zostavax completed No   Shingrix Completed?: No.    Education has been provided regarding the importance of this vaccine. Patient has been advised to call insurance company to determine out of pocket expense if they have not yet received this vaccine. Advised may also receive vaccine at local pharmacy or Health Dept. Verbalized acceptance and understanding.  Screening Tests Health Maintenance  Topic Date Due  . COVID-19 Vaccine (1) Never done  . TETANUS/TDAP  Never done  . COLONOSCOPY (Pts 45-28yrs Insurance coverage will need to be confirmed)  Never done  . INFLUENZA VACCINE  04/04/2020  . PNA vac Low Risk Adult (2 of 2 - PCV13) 08/19/2020  . MAMMOGRAM  03/02/2021  . DEXA SCAN  Completed  . Hepatitis C Screening  Completed  . HPV VACCINES  Aged Out    Health Maintenance  Health  Maintenance Due  Topic Date Due  . COVID-19 Vaccine (1) Never done  . TETANUS/TDAP  Never done  . COLONOSCOPY (Pts 45-76yrs Insurance coverage will need to be confirmed)  Never done  . INFLUENZA VACCINE  04/04/2020  . PNA vac Low Risk Adult (2 of 2 - PCV13) 08/19/2020    {Colorectal cancer screening:2101809}  Mammogram status: Ordered 11/16/2020. Pt provided with contact info and advised to call to schedule appt.   Bone Density status: Completed 11/18/2019. Results reflect: Bone density results: OSTEOPENIA. Repeat  every 2 years.  Lung Cancer Screening: (Low Dose CT Chest recommended if Age 31-80 years, 30 pack-year currently smoking OR have quit w/in 15years.) does not qualify.   Lung Cancer Screening Referral: N/A  Additional Screening:  Hepatitis C Screening: does qualify; Completed 08/20/2019  Vision Screening: Recommended annual ophthalmology exams for early detection of glaucoma and other disorders of the eye. Is the patient up to date with their annual eye exam?  {YES/NO:21197} Who is the provider or what is the name of the office in which the patient attends annual eye exams? *** If pt is not established with a provider, would they like to be referred to a provider to establish care? {YES/NO:21197}.   Dental Screening: Recommended annual dental exams for proper oral hygiene  Community Resource Referral / Chronic Care Management: CRR required this visit?  No   CCM required this visit?  No      Plan:     I have personally reviewed and noted the following in the patient's chart:   . Medical and social history . Use of alcohol, tobacco or illicit drugs  . Current medications and supplements . Functional ability and status . Nutritional status . Physical activity . Advanced directives . List of other physicians . Hospitalizations, surgeries, and ER visits in previous 12 months . Vitals . Screenings to include cognitive, depression, and falls . Referrals and  appointments  In addition, I have reviewed and discussed with patient certain preventive protocols, quality metrics, and best practice recommendations. A written personalized care plan for preventive services as well as general preventive health recommendations were provided to patient.     Ofilia Neas, LPN   05/24/1006   Nurse Notes: None

## 2020-11-16 ENCOUNTER — Ambulatory Visit: Payer: Medicare Other

## 2020-11-16 ENCOUNTER — Other Ambulatory Visit: Payer: Self-pay

## 2020-11-16 DIAGNOSIS — Z0000 Encounter for general adult medical examination without abnormal findings: Secondary | ICD-10-CM

## 2020-11-18 ENCOUNTER — Ambulatory Visit (INDEPENDENT_AMBULATORY_CARE_PROVIDER_SITE_OTHER): Payer: Medicare Other | Admitting: Podiatry

## 2020-11-18 ENCOUNTER — Other Ambulatory Visit: Payer: Self-pay

## 2020-11-18 ENCOUNTER — Ambulatory Visit (INDEPENDENT_AMBULATORY_CARE_PROVIDER_SITE_OTHER): Payer: Medicare Other

## 2020-11-18 DIAGNOSIS — M21611 Bunion of right foot: Secondary | ICD-10-CM

## 2020-11-18 DIAGNOSIS — M2011 Hallux valgus (acquired), right foot: Secondary | ICD-10-CM

## 2020-11-18 DIAGNOSIS — Z9889 Other specified postprocedural states: Secondary | ICD-10-CM

## 2020-11-18 DIAGNOSIS — M2041 Other hammer toe(s) (acquired), right foot: Secondary | ICD-10-CM

## 2020-11-18 MED ORDER — OXYCODONE HCL 5 MG PO TABS: 5.0000 mg | ORAL_TABLET | Freq: Four times a day (QID) | ORAL | 0 refills | Status: AC | PRN

## 2020-11-18 MED ORDER — IBUPROFEN 600 MG PO TABS: 600.0000 mg | ORAL_TABLET | Freq: Four times a day (QID) | ORAL | 0 refills | Status: AC | PRN

## 2020-11-22 NOTE — Progress Notes (Signed)
  Subjective:  Patient ID: Tiffany Berg, female    DOB: 1956-07-20,  MRN: [id number]  Chief Complaint  Patient presents with  . Routine Post Op    PT stated that she is still having a little bit of pain and she feels like she may need some more pain medication     DOS: 10/15/2020 Procedure: Lapidus bunionectomy with calcaneal autograft, second hammertoe correction  65 y.o. female returns for post-op check.  She is doing well, pain is improving but she still needs narcotic pain medication.  She requests a refill of this again today. Review of Systems: Negative except as noted in the HPI. Denies N/V/F/Ch.   Objective:  There were no vitals filed for this visit. There is no height or weight on file to calculate BMI. Constitutional Well developed. Well nourished.  Vascular Foot warm and well perfused. Capillary refill normal to all digits.   Neurologic Normal speech. Oriented to person, place, and time. Epicritic sensation to light touch grossly present bilaterally.  Dermatologic  incisions are well-healed  Orthopedic: Tenderness to palpation noted about the surgical site.   Radiographs of the right foot were taken: Good maintenance of correction, osseous bridging across the arthrodesis site of the Lapidus as well as the Akin osteotomy Assessment:   1. Hallux valgus with bunions, right    Plan:  Patient was evaluated and treated and all questions answered.  S/p foot surgery right -Progressing as expected post-operatively. -Kirschner wire pulled uneventfully -May begin partial weightbearing in the CAM boot and increase to full weightbearing in the boot by her next visit -New x-rays next visit -Refill of oxycodone and ibuprofen sent to pharmacy, do not expect her to need this at her next visit  Return in about 1 month (around 12/19/2020) for take new x-rays, follow up on right foot surgery.

## 2020-12-02 NOTE — Progress Notes (Signed)
Subjective:   Tiffany Berg is a 65 y.o. female who presents for an Initial Medicare Annual Wellness Visit.      Review of Systems    n/a Cardiac Risk Factors include: advanced age (>67men, >56 women);dyslipidemia     Objective:    There were no vitals filed for this visit. There is no height or weight on file to calculate BMI.  Advanced Directives 10/15/2020 03/12/2020 11/14/2019 02/19/2015 02/17/2015  Does Patient Have a Medical Advance Directive? No No No No No  Would patient like information on creating a medical advance directive? Yes (MAU/Ambulatory/Procedural Areas - Information given) No - Patient declined Yes (Inpatient - patient defers creating a medical advance directive at this time - Information given) Yes - Educational materials given Yes - Educational materials given    Current Medications (verified) Outpatient Encounter Medications as of 12/03/2020  Medication Sig  . B Complex Vitamins (VITAMIN B COMPLEX PO) Take by mouth.  [name] 75 MCG tablet TAKE 1 TABLET BY MOUTH ONCE DAILY BEFORE BREAKFAST  . ibuprofen (ADVIL) 600 MG tablet Take 1 tablet (600 mg total) by mouth every 6 (six) hours as needed for up to 28 days.  Marland Kitchen lovastatin (MEVACOR) 40 MG tablet Take 2 tablets (80 mg total) by mouth at bedtime.  . Misc. Devices (CRUTCH HANDGRIPS) MISC 1 Units by Does not apply route as directed. To use after foot surgery.  Marland Kitchen tiZANidine (ZANAFLEX) 2 MG tablet Take 1-2 tablets (2-4 mg total) by mouth every 6 (six) hours as needed for muscle spasms.  Marland Kitchen amoxicillin-clavulanate (AUGMENTIN) 875-125 MG tablet Take 1 tablet by mouth 2 (two) times daily. (Patient not taking: Reported on 12/03/2020)  . chlorhexidine (PERIDEX) 0.12 % solution SMARTSIG:By Mouth (Patient not taking: Reported on 12/03/2020)  . [EXPIRED] oxyCODONE (OXY IR/ROXICODONE) 5 MG immediate release tablet Take 1 tablet (5 mg total) by mouth every 6 (six) hours as needed for up to 14 days for severe pain.   No  facility-administered encounter medications on file as of 12/03/2020.    Allergies (verified) Patient has no known allergies.   History: Past Medical History:  Diagnosis Date  . Cardiac pacemaker in situ 03/14/2020   dual chamber medtronic   cardiologist--- dr Curt Bears---  ED 03-12-2020 presyncope, sob, some chest pain w/ exertion, found to have symptomatic bradycardia and second degree av block type 2  . Depression    undiagnosed- pt feels like crying sometimes for no reason  . Ductal carcinoma in situ (DCIS) of left breast oncologist--  dr c. Faythe Ghee Deanne Coffer)   dx 10/ 2014  DCIS left breast , ER/PR+;  07-22-2013 s/p  left breast partial mastectomy w/ sln dissection's and  08-12-2013 s/p left breast lumpecotmy (excision another site);  completed radiation 02/ 2015  . Headache   . Hyperlipidemia   . Hypothyroidism   . Mobitz type 2 second degree heart block 03/12/2020   w/ symptomatic bradycardia, rate 30s;  s/p  PPM 03-14-2020  . Pre-diabetes   . Symptomatic bradycardia 03/12/2020  . Wears glasses   . Wears partial dentures    upper and lower   Past Surgical History:  Procedure Laterality Date  . Barbie Banner OSTEOTOMY Right 10/15/2020   Procedure: [employer];  Surgeon: Criselda Peaches, DPM;  Location[facility];  Service: Podiatry;  Laterality: Right;  REGIONAL BLOCK  . ANTERIOR AND POSTERIOR REPAIR N/A 02/18/2015   Procedure: ANTERIOR (CYSTOCELE) AND POSTERIOR REPAIR (RECTOCELE);  Surgeon: Everett Graff, MD;  Location: [facility];  Service: Gynecology;  Laterality: N/A;  . BLADDER SUSPENSION N/A 02/18/2015   Procedure: TRANSVAGINAL TAPE (TVT) PROCEDURE;  Surgeon: Everett Graff, MD;  Location: [facility];  Service: Gynecology;  Laterality: N/A;  . CYSTOSCOPY Bilateral 02/18/2015   Procedure: CYSTOSCOPY;  Surgeon: Everett Graff, MD;  Location: [facility];  Service: Gynecology;  Laterality: Bilateral;  . HALLUX VALGUS LAPIDUS Right 10/15/2020   Procedure: HALLUX VALGUS LAPIDUS AND  BONE GRAFT;  Surgeon: Criselda Peaches, DPM;  Location: [facility];  Service: Podiatry;  Laterality: Right;  WITH IV SEDATION; REGIONAL BLOCK  . HAMMER TOE SURGERY Right 10/15/2020   Procedure: HAMMER TOE CORRECTION;  Surgeon: Criselda Peaches, DPM;  Location: [place];  Service: Podiatry;  Laterality: Right;  REGIONAL BLOCK  . LAPAROSCOPIC ASSISTED VAGINAL HYSTERECTOMY N/A 02/18/2015   Procedure: LAPAROSCOPIC ASSISTED VAGINAL HYSTERECTOMY;  Surgeon: Everett Graff, MD;  Location: [facility];  Service: Gynecology;  Laterality: N/A;  . MASTECTOMY, PARTIAL Left 08-12-2013  @[facility]    excision another site  . PACEMAKER IMPLANT N/A 03/14/2020   Procedure: PACEMAKER IMPLANT;  Surgeon: Constance Haw, MD;  Location: [facility];  Service: Cardiovascular;  Laterality: N/A;  . RADIOACTIVE SEED GUIDED PARTIAL MASTECTOMY/AXILLARY SENTINEL NODE BIOPSY/AXILLARY NODE DISSECTION Left 07-22-2013  @[facility]   . SALPINGOOPHORECTOMY Bilateral 02/18/2015   Procedure: BILATERAL SALPINGO OOPHORECTOMY;  Surgeon: Everett Graff, MD;  Location: [facility];  Service: Gynecology;  Laterality: Bilateral;   Family History  Problem Relation Age of Onset  . Heart disease Mother   . Cancer Father        prostata  . Cancer Sister 58       Breast   Social History   Socioeconomic History  . Marital status: Legally Separated    Spouse name: Not on file  . Number of children: Not on file  . Years of education: Not on file  . Highest education level: Not on file  Occupational History  . Not on file  Tobacco Use  . Smoking status: Never Smoker  . Smokeless tobacco: Never Used  Vaping Use  . Vaping Use: Never used  Substance and Sexual Activity  . Alcohol use: Yes    Comment: socially  . Drug use: Never  . Sexual activity: Yes    Birth control/protection: Post-menopausal, Surgical  Other Topics Concern  . Not on file  Social History Narrative  . Not on file   Social Determinants of  Health   Financial Resource Strain: Low Risk   . Difficulty of Paying Living Expenses: Not hard at all  Food Insecurity: No Food Insecurity  . Worried About Charity fundraiser in the Last Year: Never true  . Ran Out of Food in the Last Year: Never true  Transportation Needs: No Transportation Needs  . Lack of Transportation (Medical): No  . Lack of Transportation (Non-Medical): No  Physical Activity: Unknown  . Days of Exercise per Week: Not on file  . Minutes of Exercise per Session: 30 min  Stress: Not on file  Social Connections: Moderately Isolated  . Frequency of Communication with Friends and Family: More than three times a week  . Frequency of Social Gatherings with Friends and Family: More than three times a week  . Attends Religious Services: More than 4 times per year  . Active Member of Clubs or Organizations: No  . Attends Archivist Meetings: Never  . Marital Status: Separated    Tobacco Counseling Counseling given: Not Answered  Clinical Intake:     Pain : No/denies pain     Nutritional Risks: None Diabetes: No  How often do you need to have someone help you when you read instructions, pamphlets, or other written materials from your doctor or pharmacy?: 1 - Never What is the last grade level you completed in school?: 6 th grade  Diabetic?no  Interpreter Needed?: Yes Interpreter Agency: [place] Interpreter Name: Rebeca Allegra Patient Declined Interpreter : No Patient signed [address] waiver: Yes      Activities of Daily Living In your present state of health, do you have any difficulty performing the following activities: 12/03/2020 10/15/2020  Hearing? Y N  Vision? N N  Difficulty concentrating or making decisions? Y N  Walking or climbing stairs? N N  Dressing or bathing? N N  Doing errands, shopping? N -  Preparing Food and eating ? N -  Using the Toilet? N -  In the past six months, have you accidently leaked urine? N -  Do you  have problems with loss of bowel control? N -  Managing your Medications? N -  Managing your Finances? N -  Housekeeping or managing your Housekeeping? N -  Some recent data might be hidden    Patient Care Team: [place], Betty G, MD as PCP - General (Family Medicine) Jerline Pain, MD as PCP - Cardiology (Cardiology) Constance Haw, MD as PCP - Electrophysiology (Cardiology)  Indicate any recent Medical Services you may have received from other than [facility] providers in the past year (date may be approximate).     Assessment:   This is a routine wellness examination for Kloe.  Hearing/Vision screen No exam data present  Dietary issues and exercise activities discussed: Current Exercise Habits: Home exercise routine, Type of exercise: walking, Time (Minutes): 30, Frequency (Times/Week): 3, Weekly Exercise (Minutes/Week): 90, Intensity: Mild, Exercise limited by: orthopedic condition(s)  Goals    . DIET - INCREASE LEAN PROTEINS     Low cholesterol with more fruits vegtables      Depression Screen PHQ 2/9 Scores 12/03/2020 09/02/2020 08/20/2019  PHQ - 2 Score 0 0 0    Fall Risk Fall Risk  12/03/2020 09/29/2020 08/20/2019  Falls in the past year? 0 0 1  Number falls in past yr: 0 - 0  Injury with Fall? 0 - 0  Risk for fall due to : - - History of fall(s)  Follow up Falls evaluation completed - Education provided    [place]:  Any stairs in or around the home? Yes  If so, are there any without handrails? Yes  Home free of loose throw rugs in walkways, pet beds, electrical cords, etc? Yes  Adequate lighting in your home to reduce risk of falls? Yes   ASSISTIVE DEVICES UTILIZED TO PREVENT FALLS:  Life alert? No  Use of a cane, walker or w/c? No  Grab bars in the bathroom? No  Shower chair or bench in shower? Yes  Elevated toilet seat or a handicapped toilet? Yes     TIMED UP AND GO:  Was the test performed? Yes .  Length of  time to ambulate 10 feet: 6 sec.   Gait steady and fast with assistive device  Cognitive Function:   Normal cognitive status assessed by direct observation by this Nurse Health Advisor. No abnormalities found.         Immunizations Immunization History  Administered Date(s) Administered  . Fluad Quad(high Dose 65+)  08/20/2019  . Influenza, Seasonal, Injecte, Preservative Fre 07/16/2012  . Influenza-Unspecified 06/17/2013  . Pneumococcal Polysaccharide-23 08/20/2019    TDAP status: Due, Education has been provided regarding the importance of this vaccine. Advised may receive this vaccine at local pharmacy or Health Dept. Aware to provide a copy of the vaccination record if obtained from local pharmacy or Health Dept. Verbalized acceptance and understanding.  Flu Vaccine status: Up to date  Pneumococcal vaccine status: Up to date  Covid-19 vaccine status: Information provided on how to obtain vaccines.   Qualifies for Shingles Vaccine? No   Zostavax completed No   Shingrix Completed?: No.    Education has been provided regarding the importance of this vaccine. Patient has been advised to call insurance company to determine out of pocket expense if they have not yet received this vaccine. Advised may also receive vaccine at local pharmacy or Health Dept. Verbalized acceptance and understanding.  Screening Tests Health Maintenance  Topic Date Due  . COVID-19 Vaccine (1) Never done  . TETANUS/TDAP  Never done  . COLONOSCOPY (Pts 45-64yrs Insurance coverage will need to be confirmed)  Never done  . PNA vac Low Risk Adult (2 of 2 - PCV13) 08/19/2020  . MAMMOGRAM  03/02/2021  . INFLUENZA VACCINE  04/04/2021  . DEXA SCAN  Completed  . Hepatitis C Screening  Completed  . HPV VACCINES  Aged Out    Health Maintenance  Health Maintenance Due  Topic Date Due  . COVID-19 Vaccine (1) Never done  . TETANUS/TDAP  Never done  . COLONOSCOPY (Pts 45-77yrs Insurance coverage will need  to be confirmed)  Never done  . PNA vac Low Risk Adult (2 of 2 - PCV13) 08/19/2020    Colorectal cancer screening: Referral to GI placed 12/03/2020. Pt aware the office will call re: appt.  Mammogram status: Completed 05/31/2020. Repeat every year  Bone Density status: Completed 11/18/2019. Results reflect: Bone density results: NORMAL. Repeat every 5 years.  Lung Cancer Screening: (Low Dose CT Chest recommended if Age 52-80 years, 30 pack-year currently smoking OR have quit w/in 15years.) does not qualify.   Lung Cancer Screening Referral:    Additional Screening:  Hepatitis C Screening: does qualify  Vision Screening: Recommended annual ophthalmology exams for early detection of glaucoma and other disorders of the eye. Is the patient up to date with their annual eye exam?  Yes  Who is the provider or what is the name of the office in which the patient attends annual eye exams? unknowm If pt is not established with a provider, would they like to be referred to a provider to establish care? Yes .   Dental Screening: Recommended annual dental exams for proper oral hygiene  Community Resource Referral / Chronic Care Management: CRR required this visit?  No   CCM required this visit?  No      Plan:     I have personally reviewed and noted the following in the patient's chart:   . Medical and social history . Use of alcohol, tobacco or illicit drugs  . Current medications and supplements . Functional ability and status . Nutritional status . Physical activity . Advanced directives . List of other physicians . Hospitalizations, surgeries, and ER visits in previous 12 months . Vitals . Screenings to include cognitive, depression, and falls . Referrals and appointments  In addition, I have reviewed and discussed with patient certain preventive protocols, quality metrics, and best practice recommendations. A written personalized care plan for preventive  services as well as  general preventive health recommendations were provided to patient.     Randel Pigg, LPN   02/05/1582   Nurse Notes: none

## 2020-12-03 ENCOUNTER — Ambulatory Visit (INDEPENDENT_AMBULATORY_CARE_PROVIDER_SITE_OTHER): Payer: Medicare Other

## 2020-12-03 ENCOUNTER — Other Ambulatory Visit: Payer: Self-pay

## 2020-12-03 VITALS — BP 140/80 | HR 75 | Temp 97.8°F | Wt 150.0 lb

## 2020-12-03 DIAGNOSIS — Z0100 Encounter for examination of eyes and vision without abnormal findings: Secondary | ICD-10-CM

## 2020-12-03 DIAGNOSIS — Z1211 Encounter for screening for malignant neoplasm of colon: Secondary | ICD-10-CM | POA: Diagnosis not present

## 2020-12-03 DIAGNOSIS — Z0000 Encounter for general adult medical examination without abnormal findings: Secondary | ICD-10-CM | POA: Diagnosis not present

## 2020-12-03 NOTE — Patient Instructions (Signed)
[name] , Thank you for taking time to come for your Medicare Wellness Visit. I appreciate your ongoing commitment to your health goals. Please review the following plan we discussed and let me know if I can assist you in the future.   Screening recommendations/referrals: Colonoscopy: orders placed Mammogram: up to date due 05/31/2021 Bone Density: Up to date  Recommended yearly ophthalmology/optometry visit for glaucoma screening and checkup Recommended yearly dental visit for hygiene and checkup  Vaccinations: Influenza vaccine: due Fall 2022 Pneumococcal vaccine: completed series  Tdap vaccine: pt will obtain health department  Shingles vaccine: pt will obtain at the health department   [place], [place]     Advanced directives: Patient  will provide copies   Conditions/risks identified: None   Next appointment: None    Preventive Care 67 Years and Older, Female Preventive care refers to lifestyle choices and visits with your health care provider that can promote health and wellness. What does preventive care include?  A yearly physical exam. This is also called an annual well check.  Dental exams once or twice a year.  Routine eye exams. Ask your health care provider how often you should have your eyes checked.  Personal lifestyle choices, including:  Daily care of your teeth and gums.  Regular physical activity.  Eating a healthy diet.  Avoiding tobacco and drug use.  Limiting alcohol use.  Practicing safe sex.  Taking low-dose aspirin every day.  Taking vitamin and mineral supplements as recommended by your health care provider. What happens during an annual well check? The services and screenings done by your health care provider during your annual well check will depend on your age, overall health, lifestyle risk factors, and family history of disease. Counseling  Your health care provider may  ask you questions about your:  Alcohol use.  Tobacco use.  Drug use.  Emotional well-being.  Home and relationship well-being.  Sexual activity.  Eating habits.  History of falls.  Memory and ability to understand (cognition).  Work and work Statistician.  Reproductive health. Screening  You may have the following tests or measurements:  Height, weight, and BMI.  Blood pressure.  Lipid and cholesterol levels. These may be checked every 5 years, or more frequently if you are over 86 years old.  Skin check.  Lung cancer screening. You may have this screening every year starting at age 67 if you have a 30-pack-year history of smoking and currently smoke or have quit within the past 15 years.  Fecal occult blood test (FOBT) of the stool. You may have this test every year starting at age 67.  Flexible sigmoidoscopy or colonoscopy. You may have a sigmoidoscopy every 5 years or a colonoscopy every 10 years starting at age 5.  Hepatitis C blood test.  Hepatitis B blood test.  Sexually transmitted disease (STD) testing.  Diabetes screening. This is done by checking your blood sugar (glucose) after you have not eaten for a while (fasting). You may have this done every 1-3 years.  Bone density scan. This is done to screen for osteoporosis. You may have this done starting at age 67.  Mammogram. This may be done every 1-2 years. Talk to your health care provider about how often you should have regular mammograms. Talk with your health care provider about your test results, treatment options, and if necessary, the need for more tests. Vaccines  Your health care provider may recommend certain vaccines,  such as:  Influenza vaccine. This is recommended every year.  Tetanus, diphtheria, and acellular pertussis (Tdap, Td) vaccine. You may need a Td booster every 10 years.  Zoster vaccine. You may need this after age 67.  Pneumococcal 13-valent conjugate (PCV13) vaccine. One  dose is recommended after age 67.  Pneumococcal polysaccharide (PPSV23) vaccine. One dose is recommended after age 67. Talk to your health care provider about which screenings and vaccines you need and how often you need them. This information is not intended to replace advice given to you by your health care provider. Make sure you discuss any questions you have with your health care provider. Document Released: 09/17/2015 Document Revised: 05/10/2016 Document Reviewed: 06/22/2015 Elsevier Interactive Patient Education  2017 [place] Prevention in the Home Falls can cause injuries. They can happen to people of all ages. There are many things you can do to make your home safe and to help prevent falls. What can I do on the outside of my home?  Regularly fix the edges of walkways and driveways and fix any cracks.  Remove anything that might make you trip as you walk through a door, such as a raised step or threshold.  Trim any bushes or trees on the path to your home.  Use bright outdoor lighting.  Clear any walking paths of anything that might make someone trip, such as rocks or tools.  Regularly check to see if handrails are loose or broken. Make sure that both sides of any steps have handrails.  Any raised decks and porches should have guardrails on the edges.  Have any leaves, snow, or ice cleared regularly.  Use sand or salt on walking paths during winter.  Clean up any spills in your garage right away. This includes oil or grease spills. What can I do in the bathroom?  Use night lights.  Install grab bars by the toilet and in the tub and shower. Do not use towel bars as grab bars.  Use non-skid mats or decals in the tub or shower.  If you need to sit down in the shower, use a plastic, non-slip stool.  Keep the floor dry. Clean up any water that spills on the floor as soon as it happens.  Remove soap buildup in the tub or shower regularly.  Attach bath  mats securely with double-sided non-slip rug tape.  Do not have throw rugs and other things on the floor that can make you trip. What can I do in the bedroom?  Use night lights.  Make sure that you have a light by your bed that is easy to reach.  Do not use any sheets or blankets that are too big for your bed. They should not hang down onto the floor.  Have a firm chair that has side arms. You can use this for support while you get dressed.  Do not have throw rugs and other things on the floor that can make you trip. What can I do in the kitchen?  Clean up any spills right away.  Avoid walking on wet floors.  Keep items that you use a lot in easy-to-reach places.  If you need to reach something above you, use a strong step stool that has a grab bar.  Keep electrical cords out of the way.  Do not use floor polish or wax that makes floors slippery. If you must use wax, use non-skid floor wax.  Do not have throw rugs and other things on  the floor that can make you trip. What can I do with my stairs?  Do not leave any items on the stairs.  Make sure that there are handrails on both sides of the stairs and use them. Fix handrails that are broken or loose. Make sure that handrails are as long as the stairways.  Check any carpeting to make sure that it is firmly attached to the stairs. Fix any carpet that is loose or worn.  Avoid having throw rugs at the top or bottom of the stairs. If you do have throw rugs, attach them to the floor with carpet tape.  Make sure that you have a light switch at the top of the stairs and the bottom of the stairs. If you do not have them, ask someone to add them for you. What else can I do to help prevent falls?  Wear shoes that:  Do not have high heels.  Have rubber bottoms.  Are comfortable and fit you well.  Are closed at the toe. Do not wear sandals.  If you use a stepladder:  Make sure that it is fully opened. Do not climb a closed  stepladder.  Make sure that both sides of the stepladder are locked into place.  Ask someone to hold it for you, if possible.  Clearly mark and make sure that you can see:  Any grab bars or handrails.  First and last steps.  Where the edge of each step is.  Use tools that help you move around (mobility aids) if they are needed. These include:  Canes.  Walkers.  Scooters.  Crutches.  Turn on the lights when you go into a dark area. Replace any light bulbs as soon as they burn out.  Set up your furniture so you have a clear path. Avoid moving your furniture around.  If any of your floors are uneven, fix them.  If there are any pets around you, be aware of where they are.  Review your medicines with your doctor. Some medicines can make you feel dizzy. This can increase your chance of falling. Ask your doctor what other things that you can do to help prevent falls. This information is not intended to replace advice given to you by your health care provider. Make sure you discuss any questions you have with your health care provider. Document Released: 06/17/2009 Document Revised: 01/27/2016 Document Reviewed: 09/25/2014 Elsevier Interactive Patient Education  2017 [employer].

## 2020-12-13 ENCOUNTER — Ambulatory Visit (INDEPENDENT_AMBULATORY_CARE_PROVIDER_SITE_OTHER): Payer: Medicare Other

## 2020-12-13 DIAGNOSIS — I441 Atrioventricular block, second degree: Secondary | ICD-10-CM

## 2020-12-15 LAB — CUP PACEART REMOTE DEVICE CHECK
Battery Remaining Longevity: 127 mo
Battery Voltage: 3.06 V
Brady Statistic AP VP Percent: 34.34 %
Brady Statistic AP VS Percent: 0 %
Brady Statistic AS VP Percent: 65.64 %
Brady Statistic AS VS Percent: 0.02 %
Brady Statistic RA Percent Paced: 34.33 %
Brady Statistic RV Percent Paced: 99.98 %
Date Time Interrogation Session: 20220410210614
Implantable Lead Implant Date: 20210711
Implantable Lead Implant Date: 20210711
Implantable Lead Location: 753859
Implantable Lead Location: 753860
Implantable Lead Model: 5076
Implantable Lead Model: 5076
Implantable Pulse Generator Implant Date: 20210711
Lead Channel Impedance Value: 342 Ohm
Lead Channel Impedance Value: 380 Ohm
Lead Channel Impedance Value: 494 Ohm
Lead Channel Impedance Value: 551 Ohm
Lead Channel Pacing Threshold Amplitude: 0.625 V
Lead Channel Pacing Threshold Amplitude: 1 V
Lead Channel Pacing Threshold Pulse Width: 0.4 ms
Lead Channel Pacing Threshold Pulse Width: 0.4 ms
Lead Channel Sensing Intrinsic Amplitude: 3.375 mV
Lead Channel Sensing Intrinsic Amplitude: 3.375 mV
Lead Channel Sensing Intrinsic Amplitude: 4.75 mV
Lead Channel Sensing Intrinsic Amplitude: 4.75 mV
Lead Channel Setting Pacing Amplitude: 2 V
Lead Channel Setting Pacing Amplitude: 2.5 V
Lead Channel Setting Pacing Pulse Width: 0.4 ms
Lead Channel Setting Sensing Sensitivity: 1.2 mV

## 2020-12-23 ENCOUNTER — Ambulatory Visit (INDEPENDENT_AMBULATORY_CARE_PROVIDER_SITE_OTHER): Payer: Medicare Other

## 2020-12-23 ENCOUNTER — Other Ambulatory Visit: Payer: Self-pay

## 2020-12-23 ENCOUNTER — Encounter: Payer: Self-pay | Admitting: Podiatry

## 2020-12-23 ENCOUNTER — Ambulatory Visit (INDEPENDENT_AMBULATORY_CARE_PROVIDER_SITE_OTHER): Payer: Medicare Other | Admitting: Podiatry

## 2020-12-23 DIAGNOSIS — M2011 Hallux valgus (acquired), right foot: Secondary | ICD-10-CM | POA: Diagnosis not present

## 2020-12-23 DIAGNOSIS — M2041 Other hammer toe(s) (acquired), right foot: Secondary | ICD-10-CM

## 2020-12-23 DIAGNOSIS — M21611 Bunion of right foot: Secondary | ICD-10-CM

## 2020-12-23 DIAGNOSIS — Z9889 Other specified postprocedural states: Secondary | ICD-10-CM

## 2020-12-23 MED ORDER — ACETAMINOPHEN 500 MG PO TABS: 1000.0000 mg | ORAL_TABLET | Freq: Four times a day (QID) | ORAL | 0 refills | Status: AC | PRN

## 2020-12-23 MED ORDER — IBUPROFEN 600 MG PO TABS: 600.0000 mg | ORAL_TABLET | Freq: Four times a day (QID) | ORAL | 0 refills | Status: AC | PRN

## 2020-12-26 NOTE — Progress Notes (Signed)
  Subjective:  Patient ID: Tiffany Berg, female    DOB: 02/05/1956,  MRN: [id number]  Chief Complaint  Patient presents with  . Routine Post Op    POV pt states she has been in pain for the last 2 days and would like refills of her oxycodone medication     DOS: 10/15/2020 Procedure: Lapidus bunionectomy with calcaneal autograft, second hammertoe correction  65 y.o. female returns for post-op check.  She is doing well, pain is improving but she still has had to take some ibuprofen and narcotic pain medication. Review of Systems: Negative except as noted in the HPI. Denies N/V/F/Ch.   Objective:  There were no vitals filed for this visit. There is no height or weight on file to calculate BMI. Constitutional Well developed. Well nourished.  Vascular Foot warm and well perfused. Capillary refill normal to all digits.   Neurologic Normal speech. Oriented to person, place, and time. Epicritic sensation to light touch grossly present bilaterally.  Dermatologic  incisions are well-healed  Orthopedic: Tenderness to palpation noted about the surgical site.   Radiographs of the right foot were taken: Good maintenance of correction, is healed Assessment:   1. Hallux valgus with bunions, right    Plan:  Patient was evaluated and treated and all questions answered.  S/p foot surgery right -Progressing as expected post-operatively. -Begin full weightbearing and range of motion of the toe in regular shoe gear -New x-rays next visit -I sent refills of ibuprofen and acetaminophen, I discussed with her that she should not need oxycodone at this point and hopefully will not need to refill this at this point I think this will continue to improve the more she does  Return in about 1 month (around 01/22/2021) for new x-rays .

## 2020-12-28 NOTE — Progress Notes (Signed)
Remote pacemaker transmission.   

## 2021-01-02 ENCOUNTER — Other Ambulatory Visit: Payer: Self-pay | Admitting: Family Medicine

## 2021-01-02 DIAGNOSIS — E039 Hypothyroidism, unspecified: Secondary | ICD-10-CM

## 2021-01-11 ENCOUNTER — Telehealth: Payer: Self-pay | Admitting: Podiatry

## 2021-01-11 NOTE — Telephone Encounter (Signed)
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

## 2021-01-11 NOTE — Telephone Encounter (Signed)
Patient is scheduled to rtw on 01/13/2021. She has a appt. 01/18/2021, should I extend until that date or can she rtw on 01/13/2021?

## 2021-01-18 ENCOUNTER — Ambulatory Visit (INDEPENDENT_AMBULATORY_CARE_PROVIDER_SITE_OTHER): Payer: Medicare Other

## 2021-01-18 ENCOUNTER — Ambulatory Visit (INDEPENDENT_AMBULATORY_CARE_PROVIDER_SITE_OTHER): Payer: Medicare Other | Admitting: Podiatry

## 2021-01-18 ENCOUNTER — Other Ambulatory Visit: Payer: Self-pay

## 2021-01-18 DIAGNOSIS — M21611 Bunion of right foot: Secondary | ICD-10-CM

## 2021-01-18 DIAGNOSIS — Z9889 Other specified postprocedural states: Secondary | ICD-10-CM

## 2021-01-18 DIAGNOSIS — M2041 Other hammer toe(s) (acquired), right foot: Secondary | ICD-10-CM

## 2021-01-18 DIAGNOSIS — M2011 Hallux valgus (acquired), right foot: Secondary | ICD-10-CM

## 2021-01-19 ENCOUNTER — Telehealth: Payer: Self-pay | Admitting: Family Medicine

## 2021-01-19 ENCOUNTER — Encounter: Payer: Self-pay | Admitting: Podiatry

## 2021-01-19 NOTE — Progress Notes (Signed)
  Subjective:  Patient ID: Tiffany Berg, female    DOB: [birth year],  MRN: [id number]  Chief Complaint  Patient presents with  . Bunions      (xray)1 MONTH FU new xrays  follow up on right foot surgery    DOS: 10/15/2020 Procedure: Lapidus bunionectomy with calcaneal autograft, second hammertoe correction  65 y.o. female returns for post-op check.  She is doing well, pain is improved quite a bit and she does still have some swelling Review of Systems: Negative except as noted in the HPI. Denies N/V/F/Ch.   Objective:  There were no vitals filed for this visit. There is no height or weight on file to calculate BMI. Constitutional Well developed. Well nourished.  Vascular Foot warm and well perfused. Capillary refill normal to all digits.   Neurologic Normal speech. Oriented to person, place, and time. Epicritic sensation to light touch grossly present bilaterally.  Dermatologic  incisions are well-healed  Orthopedic:  She has no tenderness to palpation noted about the surgical site.  Mild edema of the foot   Radiographs of the right foot were taken: Good maintenance of correction, is healed Assessment:   1. Hallux valgus with bunions, right   2. Hammertoe of right foot   3. Post-operative state    Plan:  Patient was evaluated and treated and all questions answered.  S/p foot surgery right -Progressing as expected post-operatively. -Continue full weightbearing in regular shoe gear with toe range of motion exercises.  She can do any activity with the exception of impact activity at this point. -Advised to continue her compression sleeve to reduce swelling  Return in about 3 months (around 04/20/2021) for final post op visit, new x-rays .

## 2021-01-19 NOTE — Telephone Encounter (Signed)
error 

## 2021-01-20 ENCOUNTER — Telehealth: Payer: Self-pay | Admitting: Family Medicine

## 2021-01-20 DIAGNOSIS — E039 Hypothyroidism, unspecified: Secondary | ICD-10-CM

## 2021-01-20 NOTE — Telephone Encounter (Signed)
Pt call and stated she need a refill on  EUTHYROX 75 MCG tablet sent to  [facility], [place] Phone:  [phone]  Fax:  [phone]

## 2021-01-24 MED ORDER — LEVOTHYROXINE SODIUM 75 MCG PO TABS: 75.0000 ug | ORAL_TABLET | Freq: Every day | ORAL | 3 refills | Status: DC

## 2021-01-24 NOTE — Telephone Encounter (Signed)
Rx sent in as requested. Last TSH checked in 10/2020. Refilled for a year.

## 2021-02-17 ENCOUNTER — Encounter: Payer: Self-pay | Admitting: Podiatry

## 2021-04-21 ENCOUNTER — Encounter: Payer: Medicare Other | Admitting: Podiatry

## 2021-04-22 ENCOUNTER — Encounter: Payer: Self-pay | Admitting: Family Medicine

## 2021-04-22 ENCOUNTER — Ambulatory Visit (INDEPENDENT_AMBULATORY_CARE_PROVIDER_SITE_OTHER): Payer: Medicare Other

## 2021-04-22 DIAGNOSIS — H40033 Anatomical narrow angle, bilateral: Secondary | ICD-10-CM | POA: Diagnosis not present

## 2021-04-22 DIAGNOSIS — I441 Atrioventricular block, second degree: Secondary | ICD-10-CM | POA: Diagnosis not present

## 2021-04-22 DIAGNOSIS — H40013 Open angle with borderline findings, low risk, bilateral: Secondary | ICD-10-CM | POA: Diagnosis not present

## 2021-04-22 DIAGNOSIS — H35363 Drusen (degenerative) of macula, bilateral: Secondary | ICD-10-CM | POA: Diagnosis not present

## 2021-04-22 DIAGNOSIS — H2513 Age-related nuclear cataract, bilateral: Secondary | ICD-10-CM | POA: Diagnosis not present

## 2021-04-25 LAB — CUP PACEART REMOTE DEVICE CHECK
Battery Remaining Longevity: 121 mo
Battery Voltage: 3.02 V
Brady Statistic AP VP Percent: 21.21 %
Brady Statistic AP VS Percent: 0.09 %
Brady Statistic AS VP Percent: 78.53 %
Brady Statistic AS VS Percent: 0.17 %
Brady Statistic RA Percent Paced: 21.29 %
Brady Statistic RV Percent Paced: 99.74 %
Date Time Interrogation Session: 20220818223958
Implantable Lead Implant Date: 20210711
Implantable Lead Implant Date: 20210711
Implantable Lead Location: 753859
Implantable Lead Location: 753860
Implantable Lead Model: 5076
Implantable Lead Model: 5076
Implantable Pulse Generator Implant Date: 20210711
Lead Channel Impedance Value: 342 Ohm
Lead Channel Impedance Value: 342 Ohm
Lead Channel Impedance Value: 456 Ohm
Lead Channel Impedance Value: 589 Ohm
Lead Channel Pacing Threshold Amplitude: 0.75 V
Lead Channel Pacing Threshold Amplitude: 0.875 V
Lead Channel Pacing Threshold Pulse Width: 0.4 ms
Lead Channel Pacing Threshold Pulse Width: 0.4 ms
Lead Channel Sensing Intrinsic Amplitude: 3.5 mV
Lead Channel Sensing Intrinsic Amplitude: 3.5 mV
Lead Channel Sensing Intrinsic Amplitude: 4.125 mV
Lead Channel Sensing Intrinsic Amplitude: 4.125 mV
Lead Channel Setting Pacing Amplitude: 2 V
Lead Channel Setting Pacing Amplitude: 2.5 V
Lead Channel Setting Pacing Pulse Width: 0.4 ms
Lead Channel Setting Sensing Sensitivity: 1.2 mV

## 2021-05-02 ENCOUNTER — Other Ambulatory Visit: Payer: Self-pay

## 2021-05-02 ENCOUNTER — Ambulatory Visit (INDEPENDENT_AMBULATORY_CARE_PROVIDER_SITE_OTHER): Payer: Medicare Other | Admitting: Podiatry

## 2021-05-02 ENCOUNTER — Ambulatory Visit (INDEPENDENT_AMBULATORY_CARE_PROVIDER_SITE_OTHER): Payer: Medicare Other

## 2021-05-02 DIAGNOSIS — G629 Polyneuropathy, unspecified: Secondary | ICD-10-CM

## 2021-05-02 DIAGNOSIS — M2011 Hallux valgus (acquired), right foot: Secondary | ICD-10-CM

## 2021-05-02 DIAGNOSIS — M21611 Bunion of right foot: Secondary | ICD-10-CM

## 2021-05-02 MED ORDER — LIDOCAINE 5 % EX OINT: 1.0000 "application " | TOPICAL_OINTMENT | Freq: Three times a day (TID) | CUTANEOUS | 2 refills | Status: AC | PRN

## 2021-05-04 NOTE — Progress Notes (Signed)
  Subjective:  Patient ID: Tiffany Berg, female    DOB: Nov 14, 1955,  MRN: [id number]  Chief Complaint  Patient presents with   Bunions    3 month follow up bunions right    DOS: 10/15/2020 Procedure: Lapidus bunionectomy with calcaneal autograft, second hammertoe correction  65 y.o. female returns for post-op check.  She is doing well, still has some tenderness over the incision but much improved Review of Systems: Negative except as noted in the HPI. Denies N/V/F/Ch.   Objective:  There were no vitals filed for this visit. There is no height or weight on file to calculate BMI. Constitutional Well developed. Well nourished.  Vascular Foot warm and well perfused. Capillary refill normal to all digits.   Neurologic Normal speech. Oriented to person, place, and time. Epicritic sensation to light touch grossly present bilaterally.  Dermatologic  incisions are well-healed, they are slightly tender to palpation dorsally over the MTPJ  Orthopedic: Postoperative edema has fully resolved she does have some stiffness of the first MTPJ   Radiographs of the right foot were taken: Good maintenance of correction, is healed Assessment:   1. Peripheral neuritis   2. Hallux valgus with bunions, right    Plan:  Patient was evaluated and treated and all questions answered.  S/p foot surgery right -Overall doing well and she shows good healing of her Lapidus without recurrence.  Advised to continue range of motion of the big toe joint.  I did prescribe her lidocaine ointment to apply to the incision suspect this is local neuritis there could be some possible nerve entrapment along the dorsal digital cutaneous nerve.  We discussed the possibility of an injection but she would like to hold off on this.  We will reevaluate her towards the end of the year to see if this is necessary  Return in about 15 weeks (around 08/15/2021) for follow up bunion surgery / nerve pain .

## 2021-05-07 NOTE — Progress Notes (Signed)
Remote pacemaker transmission.   

## 2021-05-13 DIAGNOSIS — Z1231 Encounter for screening mammogram for malignant neoplasm of breast: Secondary | ICD-10-CM | POA: Diagnosis not present

## 2021-05-13 DIAGNOSIS — Z95810 Presence of automatic (implantable) cardiac defibrillator: Secondary | ICD-10-CM | POA: Diagnosis not present

## 2021-05-13 DIAGNOSIS — D0512 Intraductal carcinoma in situ of left breast: Secondary | ICD-10-CM | POA: Diagnosis not present

## 2021-05-24 ENCOUNTER — Encounter: Payer: Self-pay | Admitting: Family Medicine

## 2021-05-24 ENCOUNTER — Telehealth (INDEPENDENT_AMBULATORY_CARE_PROVIDER_SITE_OTHER): Payer: Medicare Other | Admitting: Family Medicine

## 2021-05-24 VITALS — Ht 62.0 in

## 2021-05-24 DIAGNOSIS — Z20822 Contact with and (suspected) exposure to covid-19: Secondary | ICD-10-CM

## 2021-05-24 DIAGNOSIS — J069 Acute upper respiratory infection, unspecified: Secondary | ICD-10-CM | POA: Diagnosis not present

## 2021-05-24 DIAGNOSIS — R059 Cough, unspecified: Secondary | ICD-10-CM

## 2021-05-24 MED ORDER — HYDROCODONE BIT-HOMATROP MBR 5-1.5 MG/5ML PO SOLN: 5.0000 mL | Freq: Two times a day (BID) | ORAL | 0 refills | Status: AC | PRN

## 2021-05-24 MED ORDER — FLUTICASONE PROPIONATE 50 MCG/ACT NA SUSP: 1.0000 | Freq: Two times a day (BID) | NASAL | 0 refills | Status: DC

## 2021-05-24 MED ORDER — BENZONATATE 100 MG PO CAPS: 200.0000 mg | ORAL_CAPSULE | Freq: Two times a day (BID) | ORAL | 0 refills | Status: AC | PRN

## 2021-05-24 MED ORDER — NIRMATRELVIR/RITONAVIR (PAXLOVID)TABLET: 3.0000 | ORAL_TABLET | Freq: Two times a day (BID) | ORAL | 0 refills | Status: AC

## 2021-05-24 NOTE — Progress Notes (Signed)
Virtual Visit via Video Note I connected with Tiffany Berg on 05/24/21 at  4:00 PM EDT by telephone and verified that I am speaking with the correct person using two identifiers.   I discussed the limitations, risks, security and privacy concerns of performing an evaluation and management service by telephone and the availability of in person appointments. I also discussed with the patient that there may be a patient responsible charge related to this service. The patient expressed understanding and agreed to proceed.  Location patient: home Location provider: work office Participants present for the call: patient, provider Patient did not have a visit in the prior 7 days to address this/these issue(s).  Chief Complaint  Patient presents with   Not feeling well    Pale, chest pain, nausea, fatigue.   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 Subjective fever,chills, fatigue, bitemporal headache, body aches, nasal congestion, rhinorrhea sore throat,and non productive cough. "Little" SOB, at rest, she cannot breath through nose. Chest and back pain, 8/10, constant. Pain is not radiated and not associated palpitations, diaphoresis, or lower extremity edema.  She is not sure about anosmia or ageusia.  + Nausea. Negative for abdominal pain, vomiting, changes in bowel habits, urinary symptoms, or a skin rash.  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 COVID-19 vaccination completed. She has taken Tylenol and home remedies (tea,honey). She just had a COVID 19 test done at [facility], result will be back in 2-3 days. She does not have a rapid home test.  Observations/Objective: Patient sounds cheerful and well on the phone. I do not appreciate any  SOB,wheezing,or cough. Speech and thought processing are grossly intact. Patient reported vitals:Ht 5\' 2"  (1.575 m)   BMI 27.44 kg/m   Assessment and Plan:  Tiffany Berg had a virtual visit today (telephone)  Diagnoses and all orders for this visit:  1. Exposure to confirmed case of COVID-19 - nirmatrelvir/ritonavir EUA (PAXLOVID) 20 x 150 MG & 10 x 100MG  TABS; Take 3 tablets by mouth 2 (two) times daily for 5 days. (Take nirmatrelvir 150 mg two tablets twice daily for 5 days and ritonavir 100 mg one tablet twice daily for 5 days) Patient GFR is 79 in 06/2020.  Dispense: 30 tablet; Refill: 0  2. URI, acute The probability of her symptoms be caused by COVID 19 infection is high. [facility]ending [facility] COVID [facility] test result. I recommend performing a home test today at home and to let me know result. She would like to start antiviral treatment.  We discussed clinical presentation of COVID 19 infection,possible complications (including thrombotic events), and treatment options. She has risk for complications from COVID 19 infection. We discussed oral antiviral options and side effects. Paxlovid sent to her pharmacy to pick up if she decides to take it. Symptomatic treatment with plenty of fluids,rest,tylenol 500 mg 3-4 times per day prn. Throat lozenges if needed for sore throat. Nasal saline irrigations prn and Flonase nasal spray daily as needed for nasal congestion. 7 days of quarantine recommended, starting from symptoms onset, letter for work will be provided. Clearly instructed about warning signs.  - nirmatrelvir/ritonavir EUA (PAXLOVID) 20 x 150 MG & 10 x 100MG  TABS; Take 3 tablets by mouth 2 (two) times daily for 5 days. (Take nirmatrelvir 150 mg  two tablets twice daily for 5 days and ritonavir 100 mg one tablet twice daily for 5 days) Patient GFR is 79 in 06/2020.  Dispense: 30 tablet; Refill: 0  3. Cough I do not think CXR is needed at this time. Her "little" SOB seems to be caused by  nasal congestion.She was instructed to monitor for symptoms that may suggest respiratory distress, in which case she will need to go to the ER. Explained that cough and congestion can last a few more days and even weeks after acute symptoms have resolved. Symptomatic treatment with Hycodan and benzonatate recommended. Adequate hydration.   Son side effects of medication discussed.  - HYDROcodone bit-homatropine (HYCODAN) 5-1.5 MG/5ML syrup; Take 5 mLs by mouth every 12 (twelve) hours as needed for up to 10 days for cough.  Dispense: 100 mL; Refill: 0 - benzonatate (TESSALON) 100 MG capsule; Take 2 capsules (200 mg total) by mouth 2 (two) times daily as needed for up to 10 days.  Dispense: 30 capsule; Refill: 0  Follow Up Instructions:  Return if symptoms worsen or fail to improve.  I did not refer this patient for an OV in the next 24 hours for this/these issue(s).  I discussed the assessment and treatment plan with the patient. The patient was provided an opportunity to ask questions and all were answered. The patient agreed with the plan and demonstrated an understanding of the instructions.   The patient was advised to call back or seek an in-person evaluation if the symptoms worsen or if the condition fails to improve as anticipated.  I provided 24 minutes of non-face-to-face time during this encounter.  Betty Martinique, MD

## 2021-05-27 ENCOUNTER — Other Ambulatory Visit: Payer: Self-pay | Admitting: Family Medicine

## 2021-07-22 ENCOUNTER — Ambulatory Visit (INDEPENDENT_AMBULATORY_CARE_PROVIDER_SITE_OTHER): Payer: Medicare Other

## 2021-07-22 DIAGNOSIS — I441 Atrioventricular block, second degree: Secondary | ICD-10-CM | POA: Diagnosis not present

## 2021-07-22 LAB — CUP PACEART REMOTE DEVICE CHECK
Battery Remaining Longevity: 117 mo
Battery Voltage: 3.01 V
Brady Statistic AP VP Percent: 28.71 %
Brady Statistic AP VS Percent: 0.16 %
Brady Statistic AS VP Percent: 70.85 %
Brady Statistic AS VS Percent: 0.28 %
Brady Statistic RA Percent Paced: 28.86 %
Brady Statistic RV Percent Paced: 99.56 %
Date Time Interrogation Session: 20221117222206
Implantable Lead Implant Date: 20210711
Implantable Lead Implant Date: 20210711
Implantable Lead Location: 753859
Implantable Lead Location: 753860
Implantable Lead Model: 5076
Implantable Lead Model: 5076
Implantable Pulse Generator Implant Date: 20210711
Lead Channel Impedance Value: 323 Ohm
Lead Channel Impedance Value: 361 Ohm
Lead Channel Impedance Value: 437 Ohm
Lead Channel Impedance Value: 589 Ohm
Lead Channel Pacing Threshold Amplitude: 0.75 V
Lead Channel Pacing Threshold Amplitude: 0.875 V
Lead Channel Pacing Threshold Pulse Width: 0.4 ms
Lead Channel Pacing Threshold Pulse Width: 0.4 ms
Lead Channel Sensing Intrinsic Amplitude: 3.625 mV
Lead Channel Sensing Intrinsic Amplitude: 3.625 mV
Lead Channel Sensing Intrinsic Amplitude: 4.375 mV
Lead Channel Sensing Intrinsic Amplitude: 4.375 mV
Lead Channel Setting Pacing Amplitude: 1.75 V
Lead Channel Setting Pacing Amplitude: 2.5 V
Lead Channel Setting Pacing Pulse Width: 0.4 ms
Lead Channel Setting Sensing Sensitivity: 1.2 mV

## 2021-07-25 ENCOUNTER — Telehealth: Payer: Self-pay

## 2021-07-25 NOTE — Telephone Encounter (Signed)
Yes, it is ok to change generic Levothyroxine and bring her for TSH in 6-8 weeks. Thanks, BJ

## 2021-07-25 NOTE — Telephone Encounter (Signed)
Okay to change? TSH in 6-8 weeks?

## 2021-07-25 NOTE — Telephone Encounter (Signed)
Verbal given 

## 2021-07-25 NOTE — Telephone Encounter (Signed)
Tiffany Berg  called asking for approval to change manufacture for patient 's Rx levothyroxine (EUTHYROX) 75 MCG tablet Pharmacy asking for a call back for verbal approval

## 2021-08-01 NOTE — Progress Notes (Signed)
Remote pacemaker transmission.   

## 2021-08-16 ENCOUNTER — Other Ambulatory Visit: Payer: Self-pay

## 2021-08-16 ENCOUNTER — Ambulatory Visit (INDEPENDENT_AMBULATORY_CARE_PROVIDER_SITE_OTHER): Payer: Medicare Other | Admitting: Podiatry

## 2021-08-16 ENCOUNTER — Encounter: Payer: Self-pay | Admitting: Podiatry

## 2021-08-16 DIAGNOSIS — M2012 Hallux valgus (acquired), left foot: Secondary | ICD-10-CM

## 2021-08-16 DIAGNOSIS — M21612 Bunion of left foot: Secondary | ICD-10-CM

## 2021-08-16 DIAGNOSIS — M2042 Other hammer toe(s) (acquired), left foot: Secondary | ICD-10-CM | POA: Diagnosis not present

## 2021-08-18 NOTE — Progress Notes (Signed)
°  Subjective:  Patient ID: Tiffany Berg, female    DOB: 1956/07/23,  MRN: [id number]  Chief Complaint  Patient presents with   neuritis     for follow up bunion surgery / nerve pain     65 y.o. female presents with the above complaint. History confirmed with patient.  Right foot is doing much better now.  She is 10 months status post bunionectomy and second hammertoe correction.  She is ready schedule surgery for the left foot.  Objective:  Physical Exam: warm, good capillary refill, no trophic changes or ulcerative lesions, normal DP and PT pulses, and normal sensory exam. Left Foot:  Hallux valgus deformity and second hammertoe semireducible Right Foot: Correction maintained no recurrence of hallux valgus well-healed surgical scar.  Some numbness in the area of the incision  No images are attached to the encounter.  Radiographs: Prior left foot radiographs reviewed she has hallux valgus with metatarsus primus varus and hammertoe deformity Assessment:   1. Hallux valgus with bunions, left   2. Hammertoe of left foot      Plan:  Patient was evaluated and treated and all questions answered.  Discussed the etiology and treatment including surgical and non surgical treatment for painful bunions and hammertoes.  She has exhausted all non surgical treatment prior to this visit including shoe gear changes and padding.  She has done well with her surgery on the right foot.  And she now desires surgical intervention for the left foot. We discussed all risks including but not limited to: pain, swelling, infection, scar, numbness which may be temporary or permanent, chronic pain, stiffness, nerve pain or damage, wound healing problems, bone healing problems including delayed or non-union and recurrence. Specifically we discussed the following procedures: Lapidus arthrodesis bunion correction of the left foot, possible Akin, second hammertoe correction and bone graft from ipsilateral calcaneus.  Informed consent was signed today. Surgery will be scheduled at a mutually agreeable date. Information regarding this will be forwarded to our surgery scheduler.  Previously for her other foot her daughter helped take care of her after surgery.  Her daughter would not be available to do this after surgery this time.  She is requesting that her brother travel from [place] to help take care of her.  She would like me to write a letter in support of this to give to the [facility] Department for a temporary visa.  I agreed to do this today and discussed with her if she needs anything further if I can help further please let me know.  Surgical plan:  Procedure: -Left foot Lapidus bunionectomy, possible Akin, second hammertoe correction, bone graft from heel  Location: -[facility]  Anesthesia plan: -Regional with block and sedation  Postoperative pain plan: - Tylenol 1000 mg every 6 hours, ibuprofen 600 mg every 6 hours, gabapentin 300 mg every 8 hours x5 days, oxycodone 5 mg 1-2 tabs every 6 hours only as needed  DVT prophylaxis: -Aspirin 325 twice daily  WB Restrictions / DME needs: -NWB in CAM boot which we will dispense a new one at the surgical center  Return in about 5 weeks (around 09/22/2021) for left foot x-rays, pre surgery visit for March for bunion and hammertoe .

## 2021-09-06 ENCOUNTER — Telehealth: Payer: Self-pay

## 2021-09-06 DIAGNOSIS — E039 Hypothyroidism, unspecified: Secondary | ICD-10-CM

## 2021-09-06 NOTE — Telephone Encounter (Signed)
-----   Message from Rodrigo Ran, [place] sent at 07/25/2021  2:58 PM EST ----- TSH needed in 6-8 weeks due to manufacturer change.

## 2021-09-06 NOTE — Telephone Encounter (Signed)
Can you get her scheduled for a repeat thyroid level? The order is already in. Thank you!

## 2021-09-20 ENCOUNTER — Other Ambulatory Visit: Payer: Medicare Other

## 2021-09-21 ENCOUNTER — Other Ambulatory Visit (INDEPENDENT_AMBULATORY_CARE_PROVIDER_SITE_OTHER): Payer: Medicare Other

## 2021-09-21 DIAGNOSIS — E039 Hypothyroidism, unspecified: Secondary | ICD-10-CM | POA: Diagnosis not present

## 2021-09-21 LAB — TSH: TSH: 9.07 u[IU]/mL — ABNORMAL HIGH (ref 0.35–5.50)

## 2021-09-22 ENCOUNTER — Other Ambulatory Visit: Payer: Self-pay

## 2021-09-22 ENCOUNTER — Ambulatory Visit (INDEPENDENT_AMBULATORY_CARE_PROVIDER_SITE_OTHER): Payer: Medicare Other

## 2021-09-22 ENCOUNTER — Encounter: Payer: Self-pay | Admitting: Podiatry

## 2021-09-22 ENCOUNTER — Ambulatory Visit (INDEPENDENT_AMBULATORY_CARE_PROVIDER_SITE_OTHER): Payer: Medicare Other | Admitting: Podiatry

## 2021-09-22 DIAGNOSIS — M21611 Bunion of right foot: Secondary | ICD-10-CM

## 2021-09-22 DIAGNOSIS — M2012 Hallux valgus (acquired), left foot: Secondary | ICD-10-CM | POA: Diagnosis not present

## 2021-09-22 DIAGNOSIS — M2011 Hallux valgus (acquired), right foot: Secondary | ICD-10-CM | POA: Diagnosis not present

## 2021-09-22 DIAGNOSIS — M21612 Bunion of left foot: Secondary | ICD-10-CM

## 2021-09-26 NOTE — Progress Notes (Signed)
°  Subjective:  Patient ID: Tiffany Berg, female    DOB: 09/12/1955,  MRN: [id number]  Chief Complaint  Patient presents with   Follow-up    1 month follow up surgery consult     66 y.o. female presents with the above complaint. History confirmed with patient.   Objective:  Physical Exam: warm, good capillary refill, no trophic changes or ulcerative lesions, normal DP and PT pulses, and normal sensory exam. Left Foot:  Hallux valgus deformity and second hammertoe semireducible Right Foot: Correction maintained no recurrence of hallux valgus well-healed surgical scar.  Some numbness in the area of the incision  No images are attached to the encounter.  Radiographs: Prior left foot radiographs reviewed she has hallux valgus with metatarsus primus varus and hammertoe deformity Assessment:   1. Hallux valgus with bunions, left   2. Hallux valgus with bunions, right      Plan:  Patient was evaluated and treated and all questions answered.  Discussed the etiology and treatment including surgical and non surgical treatment for painful bunions and hammertoes.  She has exhausted all non surgical treatment prior to this visit including shoe gear changes and padding.  She has done well with her surgery on the right foot.  And she now desires surgical intervention for the left foot. We discussed all risks including but not limited to: pain, swelling, infection, scar, numbness which may be temporary or permanent, chronic pain, stiffness, nerve pain or damage, wound healing problems, bone healing problems including delayed or non-union and recurrence. Specifically we discussed the following procedures: Lapidus arthrodesis bunion correction of the left foot, possible Akin, second hammertoe correction and bone graft from ipsilateral calcaneus. Informed consent was signed today. Surgery will be scheduled at a mutually agreeable date. Information regarding this will be forwarded to our surgery  scheduler.  We discussed her postoperative care and she says she has adequate family support to pursue surgery at this time.  She will let me know if this changes if we need to delay surgery.  Otherwise we will proceed as planned.  Surgical plan:  Procedure: -Left foot Lapidus bunionectomy, possible Akin, second hammertoe correction, bone graft from heel  Location: -[facility]  Anesthesia plan: -Regional with block and sedation  Postoperative pain plan: - Tylenol 1000 mg every 6 hours, ibuprofen 600 mg every 6 hours, gabapentin 300 mg every 8 hours x5 days, oxycodone 5 mg 1-2 tabs every 6 hours only as needed  DVT prophylaxis: -Aspirin 325 twice daily  WB Restrictions / DME needs: -NWB in CAM boot which we will dispense a new one at the surgical center  Return for after surgery .

## 2021-09-30 ENCOUNTER — Telehealth: Payer: Self-pay | Admitting: Family Medicine

## 2021-09-30 NOTE — Telephone Encounter (Signed)
Patient daughter Izora Gala is returning Judson Roch call regarding lab results.  Please advise

## 2021-10-03 ENCOUNTER — Telehealth (INDEPENDENT_AMBULATORY_CARE_PROVIDER_SITE_OTHER): Payer: Medicare Other | Admitting: Family Medicine

## 2021-10-03 ENCOUNTER — Encounter: Payer: Self-pay | Admitting: Podiatry

## 2021-10-03 ENCOUNTER — Other Ambulatory Visit: Payer: Self-pay

## 2021-10-03 DIAGNOSIS — J069 Acute upper respiratory infection, unspecified: Secondary | ICD-10-CM

## 2021-10-03 NOTE — Telephone Encounter (Signed)
See result note.  

## 2021-10-03 NOTE — Progress Notes (Signed)
Patient ID: Tiffany Berg, female   DOB: 1955/11/06, 66 y.o.   MRN: [id number]  This visit type was conducted due to national recommendations for restrictions regarding the COVID-19 pandemic in an effort to limit this patient's exposure and mitigate transmission in our community.   Virtual Visit via Telephone Note  I connected with Tiffany Berg on 10/03/21 at  2:15 PM EST by telephone and verified that I am speaking with the correct person using two identifiers.   I discussed the limitations, risks, security and privacy concerns of performing an evaluation and management service by telephone and the availability of in person appointments. I also discussed with the patient that there may be a patient responsible charge related to this service. The patient expressed understanding and agreed to proceed.  Location patient: home Location provider: work or home office Participants present for the call: patient, provider Patient did not have a visit in the prior 7 days to address this/these issue(s).   History of Present Illness: This call was enabled by an interpreter.  Patient reportedly has had cough, sore throat and questionable subjective fever for couple days.  No dyspnea.  She is had some body aches and nasal congestion.  Denies any nausea, vomiting, or diarrhea.  No known sick contacts.  Has been using Tylenol which helps somewhat.  Has not done any home COVID testing.  She reportedly has history of Mobitz 2 heart block.  Hyperlipidemia history.  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 Faythe Ghee ([facility])   dx 10/ 2014  DCIS left breast , ER/PR+;  07-22-2013 s/p  left breast partial mastectomy w/ sln dissection's and  08-12-2013 s/p left breast lumpecotmy (excision another site);  completed radiation 02/ 2015   Headache    Hyperlipidemia    Hypothyroidism    Mobitz type 2 second degree heart block 03/12/2020   w/ symptomatic bradycardia, rate 30s;  s/p  PPM 03-14-2020   Pre-diabetes    Symptomatic bradycardia 03/12/2020   Wears glasses    Wears partial dentures    upper and lower   Past Surgical History:  Procedure Laterality Date   Barbie Banner OSTEOTOMY Right 10/15/2020   Procedure: Barbie Banner OSTEOTOMY;  Surgeon: Criselda Peaches, DPM;  Location: [place];  Service: Podiatry;  Laterality: Right;  REGIONAL BLOCK   ANTERIOR AND POSTERIOR REPAIR N/A 02/18/2015   Procedure: ANTERIOR (CYSTOCELE) AND POSTERIOR REPAIR (RECTOCELE);  Surgeon: Everett Graff, MD;  Location: [facility];  Service: Gynecology;  Laterality: N/A;   BLADDER SUSPENSION N/A 02/18/2015   Procedure: TRANSVAGINAL TAPE (TVT) PROCEDURE;  Surgeon: Everett Graff, MD;  Location: [facility];  Service: Gynecology;  Laterality: N/A;   CYSTOSCOPY Bilateral 02/18/2015   Procedure: CYSTOSCOPY;  Surgeon: Everett Graff, MD;  Location: [facility];  Service: Gynecology;  Laterality: Bilateral;   HALLUX VALGUS LAPIDUS Right 10/15/2020   Procedure: HALLUX VALGUS LAPIDUS AND BONE GRAFT;  Surgeon: Criselda Peaches, DPM;  Location: [place];  Service: Podiatry;  Laterality: Right;  WITH IV SEDATION; REGIONAL BLOCK   HAMMER TOE SURGERY Right  10/15/2020   Procedure: HAMMER TOE CORRECTION;  Surgeon: Criselda Peaches, DPM;  Location: [facility];  Service: Podiatry;  Laterality: Right;  REGIONAL BLOCK   LAPAROSCOPIC ASSISTED VAGINAL HYSTERECTOMY N/A 02/18/2015   Procedure: LAPAROSCOPIC ASSISTED VAGINAL HYSTERECTOMY;  Surgeon: Everett Graff, MD;  Location: [facility];  Service: Gynecology;   Laterality: N/A;   MASTECTOMY, PARTIAL Left 08-12-2013  @[facility]    excision another site   PACEMAKER IMPLANT N/A 03/14/2020   Procedure: PACEMAKER IMPLANT;  Surgeon: Constance Haw, MD;  Location: [facility];  Service: Cardiovascular;  Laterality: N/A;   RADIOACTIVE SEED GUIDED PARTIAL MASTECTOMY/AXILLARY SENTINEL NODE BIOPSY/AXILLARY NODE DISSECTION Left 07-22-2013  @[facility]    SALPINGOOPHORECTOMY Bilateral 02/18/2015   Procedure: BILATERAL SALPINGO OOPHORECTOMY;  Surgeon: Everett Graff, MD;  Location: [facility];  Service: Gynecology;  Laterality: Bilateral;    reports that she has never smoked. She has never used smokeless tobacco. She reports current alcohol use. She reports that she does not use drugs. family history includes Cancer in her father; Cancer (age of onset: 35) in her sister; Heart disease in her mother. No Known Allergies    Observations/Objective: Patient sounds cheerful and well on the phone. I do not appreciate any SOB. Speech and thought processing are grossly intact. Patient reported vitals:  Assessment and Plan:  Viral URI symptoms with cough.  Patient denies any dyspnea.  In no respiratory distress.  She is day 2.  We have recommended that she do home COVID testing and if positive be in touch.  If negative, treat symptomatically for now unless she develops any increasing shortness of breath or worsening symptoms  Follow Up Instructions:   [id number] [id number] [id number] 11-20 [id number] 21-30 I did not refer this patient for an OV in the next 24 hours for this/these issue(s).  I discussed the assessment and treatment plan with the patient. The patient was provided an opportunity to ask questions and all were answered. The patient agreed with the plan and demonstrated an understanding of the instructions.   The patient was advised to call back or seek an in-person evaluation if the symptoms worsen or if the condition fails to improve as anticipated.  I provided 15 minutes of  non-face-to-face time during this encounter.   Carolann Littler, MD

## 2021-10-05 ENCOUNTER — Encounter: Payer: Self-pay | Admitting: Family Medicine

## 2021-10-05 ENCOUNTER — Telehealth: Payer: Self-pay | Admitting: Family Medicine

## 2021-10-05 DIAGNOSIS — Z20822 Contact with and (suspected) exposure to covid-19: Secondary | ICD-10-CM | POA: Diagnosis not present

## 2021-10-05 DIAGNOSIS — Z03818 Encounter for observation for suspected exposure to other biological agents ruled out: Secondary | ICD-10-CM | POA: Diagnosis not present

## 2021-10-05 DIAGNOSIS — M79676 Pain in unspecified toe(s): Secondary | ICD-10-CM

## 2021-10-05 NOTE — Telephone Encounter (Signed)
Pt daughter call and stated pt is sick and want a note to go back to work on 10/10/21 .

## 2021-10-05 NOTE — Telephone Encounter (Signed)
Last seen 10/03/2021.    Please advise if okay for work note.

## 2021-10-05 NOTE — Progress Notes (Deleted)
HPI:  [name] is a 66 y.o. female, who is here today for pre-op evaluation.   Review of Systems Rest see pertinent positives and negatives per HPI.  Current Outpatient Medications on File Prior to Visit  Medication Sig Dispense Refill   B Complex Vitamins (VITAMIN B COMPLEX PO) Take by mouth.     fluticasone (FLONASE) 50 MCG/ACT nasal spray Place 1 spray into both nostrils 2 (two) times daily. 16 g 0   levothyroxine (EUTHYROX) 75 MCG tablet Take 1 tablet (75 mcg total) by mouth daily before breakfast. 90 tablet 3   lovastatin (MEVACOR) 40 MG tablet TAKE 2 TABLETS BY MOUTH AT BEDTIME 180 tablet 1   Misc. Devices (CRUTCH HANDGRIPS) MISC 1 Units by Does not apply route as directed. To use after foot surgery. 1 each 0   tiZANidine (ZANAFLEX) 2 MG tablet Take 1-2 tablets (2-4 mg total) by mouth every 6 (six) hours as needed for muscle spasms. 60 tablet 1   No current facility-administered medications on file prior to visit.    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 Faythe Ghee [facility])   dx 10/ 2014  DCIS left breast , ER/PR+;  07-22-2013 s/p  left breast partial mastectomy w/ sln dissection's and  08-12-2013 s/p left breast lumpecotmy (excision another site);  completed radiation 02/ 2015   Headache    Hyperlipidemia    Hypothyroidism    Mobitz type 2 second degree heart block 03/12/2020   w/ symptomatic bradycardia, rate 30s;  s/p  PPM 03-14-2020   Pre-diabetes    Symptomatic bradycardia 03/12/2020   Wears glasses    Wears partial dentures    upper and lower   No Known Allergies  Social History    Socioeconomic History   Marital status: Legally Separated    Spouse name: Not on file   Number of children: Not on file   Years of education: Not on file   Highest education level: Not on file  Occupational History   Not on file  Tobacco Use   Smoking status: Never   Smokeless tobacco: Never  Vaping Use   Vaping Use: Never used  Substance and Sexual Activity   Alcohol use: Yes    Comment: socially   Drug use: Never   Sexual activity: Yes    Birth control/protection: Post-menopausal, Surgical  Other Topics Concern   Not on file  Social History Narrative   Not on file   Social Determinants of Health   Financial Resource Strain: Low Risk    Difficulty of Paying Living Expenses: Not hard at all  Food Insecurity: No Food Insecurity   Worried About Charity fundraiser in the Last Year: Never true   Ran Out of Food in the Last Year: Never true  Transportation Needs: No Transportation Needs   Lack of Transportation (Medical): No   Lack of Transportation (Non-Medical): No  Physical Activity: Unknown   Days of Exercise per Week: Not on file   Minutes of Exercise per Session: 30 min  Stress: Not on file  Social Connections: Moderately Isolated   Frequency of  Communication with Friends and Family: More than three times a week   Frequency of Social Gatherings with Friends and Family: More than three times a week   Attends Religious Services: More than 4 times per year   Active Member of [employer] or Organizations: No   Attends Archivist Meetings: Never   Marital Status: Separated    There were no vitals filed for this visit. There is no height or weight on file to calculate BMI.  Physical Exam  ASSESSMENT AND PLAN:   There are no diagnoses linked to this encounter.  No orders of the defined types were placed in this encounter.   No problem-specific Assessment & Plan notes found for this encounter.   No follow-ups on file.   Betty G. Martinique,  MD  [facility]. [place] office.

## 2021-10-05 NOTE — Telephone Encounter (Signed)
Called daughter Izora Gala, work note complete.   Will come by office to pick up copy.

## 2021-10-06 ENCOUNTER — Telehealth: Payer: Self-pay | Admitting: Podiatry

## 2021-10-06 NOTE — Telephone Encounter (Signed)
Good Morning, just have a question about Tiffany Berg disability. She has a letter stating that her 1st day out of work will be 10/26/2021, but her surgery is 11/25/2021. I just need to know why she is scheduled to be out of work a month before her surgery, im completing her paperwork I will hold paperwork for response.

## 2021-10-07 ENCOUNTER — Ambulatory Visit: Payer: Medicare Other | Admitting: Family Medicine

## 2021-10-21 ENCOUNTER — Ambulatory Visit (INDEPENDENT_AMBULATORY_CARE_PROVIDER_SITE_OTHER): Payer: Medicare Other

## 2021-10-21 DIAGNOSIS — I441 Atrioventricular block, second degree: Secondary | ICD-10-CM | POA: Diagnosis not present

## 2021-10-21 LAB — CUP PACEART REMOTE DEVICE CHECK
Battery Remaining Longevity: 110 mo
Battery Voltage: 3.01 V
Brady Statistic AP VP Percent: 31.05 %
Brady Statistic AP VS Percent: 0.61 %
Brady Statistic AS VP Percent: 67.36 %
Brady Statistic AS VS Percent: 0.99 %
Brady Statistic RA Percent Paced: 31.65 %
Brady Statistic RV Percent Paced: 98.4 %
Date Time Interrogation Session: 20230216202800
Implantable Lead Implant Date: 20210711
Implantable Lead Implant Date: 20210711
Implantable Lead Location: 753859
Implantable Lead Location: 753860
Implantable Lead Model: 5076
Implantable Lead Model: 5076
Implantable Pulse Generator Implant Date: 20210711
Lead Channel Impedance Value: 323 Ohm
Lead Channel Impedance Value: 342 Ohm
Lead Channel Impedance Value: 380 Ohm
Lead Channel Impedance Value: 551 Ohm
Lead Channel Pacing Threshold Amplitude: 0.75 V
Lead Channel Pacing Threshold Amplitude: 0.875 V
Lead Channel Pacing Threshold Pulse Width: 0.4 ms
Lead Channel Pacing Threshold Pulse Width: 0.4 ms
Lead Channel Sensing Intrinsic Amplitude: 3.625 mV
Lead Channel Sensing Intrinsic Amplitude: 3.625 mV
Lead Channel Sensing Intrinsic Amplitude: 4.125 mV
Lead Channel Sensing Intrinsic Amplitude: 4.125 mV
Lead Channel Setting Pacing Amplitude: 1.75 V
Lead Channel Setting Pacing Amplitude: 2.5 V
Lead Channel Setting Pacing Pulse Width: 0.4 ms
Lead Channel Setting Sensing Sensitivity: 1.2 mV

## 2021-10-21 NOTE — Progress Notes (Signed)
Called patient daughter Izora Gala.  Izora Gala stopped by office to pick up patient work note.

## 2021-10-26 NOTE — Progress Notes (Signed)
Remote pacemaker transmission.   

## 2021-10-28 NOTE — Progress Notes (Signed)
Chief Complaint  Patient presents with   surgery clearance    Surgery date: 3/24 -podiatry   HPI: [name] is a 66 y.o. female, who is here today for surgery clearance requested by Dr Sherryle Lis. Planning on having left foot surgery: Hallux valgus lapidus Akin osteotomy, hammertoe correction of second toe on left foot, and bone graft. She thinks procedure will be done under general anesthesia on 11/25/21. Sh had same surgery in right food on 10/15/20 with IV sedation, she recovered well and denies any complications during or after procedure.  She is planning on staying with one of her daughter during recovery.   No hx of tobacco use,CKD, or diabetes. Second degree AV block s/p pacemaker placement.   Aortic atherosclerosis has been seen on imaging, chest x-ray in 03/2020. Currently she is on lovastatin 40 mg daily.  She has had hysterectomy about 3-4 years, general anesthesia, no complications. Left breast mass 6-7 years ago, she is not sure about type of anesthesia but no perioperative complications.   "Little" left upper above breast soreness (points to area of pacemaker) and fatigue with heavy lifting. Pacemaker placement. No SOB,palpitation, or diaphoresis. She had fever 2-3 weeks ago and cold like symptoms, resolved.  -Today she is c/o muscle aches and "bone" pain: Tights and shoulders mainly at night. No hx of trauma. She has not noted rash or ecchymosis.  -Hypothyroidism: She was taking medication and eating 10 min after, a week ago she started waiting 30 min before eating after taking medication. He has has some fatigue for the past week. No sick contact.  Having difficulty sleeping for the past few days. She has not tried OTC medication.  Lab Results  Component Value Date   TSH 9.07 (H) 09/21/2021   -Bitemporal headache for the past 3-4 days, pressure like, intermittent, 7/10. Denies hx of headaches. No associated symptoms. She has not identified  exacerbating or alleviating factors.  Concerned about elevated BS's. Glucose elevated at 129 10/15/20. Negative for abdominal pain, nausea,vomiting, polydipsia,polyuria, or polyphagia.  Lab Results  Component Value Date   HGBA1C 6.0 08/20/2019   Review of Systems  Constitutional:  Positive for fatigue (x a week). Negative for appetite change, chills and unexpected weight change.  HENT:  Negative for mouth sores, nosebleeds and sore throat.   Respiratory:  Negative for cough and wheezing.   Gastrointestinal:  Negative for abdominal pain, nausea and vomiting.  Endocrine: Negative for cold intolerance and heat intolerance.  Genitourinary:  Negative for decreased urine volume, dysuria and hematuria.  Musculoskeletal:  Negative for gait problem.  Skin:  Negative for pallor and rash.  Neurological:  Negative for syncope and facial asymmetry.  Hematological:  Negative for adenopathy. Does not bruise/bleed easily.  Psychiatric/Behavioral:  Positive for sleep disturbance. Negative for confusion. The patient is nervous/anxious.   Rest see pertinent positives and negatives per HPI.  Current Outpatient Medications on File Prior to Visit  Medication Sig Dispense Refill   B Complex Vitamins (VITAMIN B COMPLEX PO) Take by mouth.     levothyroxine (EUTHYROX) 75 MCG tablet Take 1 tablet (75 mcg total) by mouth daily before breakfast. 90 tablet 3   lovastatin (MEVACOR) 40 MG tablet TAKE 2 TABLETS BY MOUTH AT BEDTIME 180 tablet 1   Misc. Devices (CRUTCH HANDGRIPS) MISC 1 Units by Does not apply route as directed. To use after foot surgery. 1 each 0   tiZANidine (ZANAFLEX) 2 MG tablet Take 1-2 tablets (2-4 mg total) by mouth every 6 (six) hours  as needed for muscle spasms. 60 tablet 1   fluticasone (FLONASE) 50 MCG/ACT nasal spray Place 1 spray into both nostrils 2 (two) times daily. 16 g 0   No current facility-administered medications on file prior to visit.   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 Faythe Ghee [facility])   dx 10/ 2014  DCIS left breast , ER/PR+;  07-22-2013 s/p  left breast partial mastectomy w/ sln dissection's and  08-12-2013 s/p left breast lumpecotmy (excision another site);  completed radiation 02/ 2015   Headache    Hyperlipidemia    Hypothyroidism    Mobitz type 2 second degree heart block 03/12/2020   w/ symptomatic bradycardia, rate 30s;  s/p  PPM 03-14-2020   Pre-diabetes    Symptomatic bradycardia 03/12/2020   Wears glasses    Wears partial dentures    upper and lower   No Known Allergies  Social History   Socioeconomic History   Marital status: Legally Separated    Spouse name: Not on file   Number of children: Not on file   Years of education: Not on file   Highest education level: Not on file  Occupational History   Not on file  Tobacco Use   Smoking status: Never   Smokeless tobacco: Never  Vaping Use   Vaping Use: Never used  Substance and Sexual Activity   Alcohol use: Yes    Comment: socially   Drug use: Never   Sexual activity: Yes    Birth control/protection: Post-menopausal, Surgical  Other Topics Concern   Not on file  Social History Narrative   Not on file   Social Determinants of Health   Financial Resource Strain: Low Risk    Difficulty of Paying Living Expenses: Not hard at all  Food Insecurity: No Food Insecurity   Worried About Charity fundraiser in the Last Year: Never true   Ran Out of Food in the Last Year: Never true  Transportation Needs: No Transportation Needs   Lack of Transportation (Medical): No   Lack of Transportation (Non-Medical): No   Physical Activity: Unknown   Days of Exercise per Week: Not on file   Minutes of Exercise per Session: 30 min  Stress: Not on file  Social Connections: Moderately Isolated   Frequency of Communication with Friends and Family: More than three times a week   Frequency of Social Gatherings with Friends and Family: More than three times a week   Attends Religious Services: More than 4 times per year   Active Member of Clubs or Organizations: No   Attends Archivist Meetings: Never   Marital Status: Separated   Vitals:   10/31/21 1509  BP: 120/78  Pulse: 81  Resp: 16  SpO2: 97%   Body mass index is 27.82 kg/m.  Physical Exam Vitals and nursing note reviewed.  Constitutional:      General: She is not in acute distress.    Appearance: She is well-developed.  HENT:     Head: Normocephalic and atraumatic.     Mouth/Throat:     Mouth: Mucous membranes are moist.  Pharynx: Oropharynx is clear.  Eyes:     Conjunctiva/sclera: Conjunctivae normal.  Cardiovascular:     Rate and Rhythm: Normal rate and regular rhythm.     Pulses:          Dorsalis pedis pulses are 2+ on the right side and 2+ on the left side.     Heart sounds: No murmur heard. Pulmonary:     Effort: Pulmonary effort is normal. No respiratory distress.     Breath sounds: Normal breath sounds.  Abdominal:     Palpations: Abdomen is soft. There is no hepatomegaly or mass.     Tenderness: There is no abdominal tenderness.  Musculoskeletal:     Comments: No signs of synovitis or tender trigger points.  Lymphadenopathy:     Cervical: No cervical adenopathy.  Skin:    General: Skin is warm.     Findings: No erythema or rash.  Neurological:     General: No focal deficit present.     Mental Status: She is alert and oriented to person, place, and time.     Cranial Nerves: No cranial nerve deficit.     Gait: Gait normal.  Psychiatric:        Mood and Affect: Mood is anxious.     Comments: Well groomed,  good eye contact.   ASSESSMENT AND PLAN:  [name] was seen today for surgery clearance.  Diagnoses and all orders for this visit: Orders Placed This Encounter  Procedures   Comprehensive metabolic panel   CBC   CK   C-reactive protein   Sedimentation rate   Hemoglobin A1c   Lab Results  Component Value Date   WBC 6.1 10/31/2021   HGB 14.1 10/31/2021   HCT 41.5 10/31/2021   MCV 91.8 10/31/2021   PLT 224.0 10/31/2021   Lab Results  Component Value Date   CREATININE 0.68 10/31/2021   BUN 18 10/31/2021   NA 140 10/31/2021   K 3.8 10/31/2021   CL 102 10/31/2021   CO2 29 10/31/2021   Lab Results  Component Value Date   ALT 17 10/31/2021   AST 26 10/31/2021   ALKPHOS 94 10/31/2021   BILITOT 0.4 10/31/2021   Lab Results  Component Value Date   HGBA1C 6.4 10/31/2021   Pre-op exam She has had similar procedure in right foot and recovered well with no complications. DVT prophylaxis: Early ambulation. Cardiac clearance from her cardiologist. She was instructed to avoid NSAIDs, Aspirin, and OTC supplements for at least 8 days before procedure. Opioid may be prescribed for pain management, which she has tolerated before but can cause constipation and increase risk for falls among some. She can take her chronic medications (lovastatin and levothyroxine) the day before of surgery and resume the day after. Will fax copy of note to Dr.McDonald after lab results are back.  Myalgia, multiple sites Seems to be a new problem. Monitor for new symptoms. Statin medication could be aggravating problem, recommend holding lovastatin for 3 weeks and monitor for changes. Further recommendation will be given according to lab results.  Temporal headache We discussed possible etiologies. History and examination do not suggest a serious process. ? Tension headache. Sleep difficulties could aggravate problem. CRP and sed rate ordered today. Instructed about warning  signs.  Insomnia, unspecified type Started recently and most likely contributing to fatigue. Good sleep hygiene recommended. OTC melatonin 5-15 mg 2 hours before bedtime on empty stomach may help. We can consider pharmacologic treatment if problem is persistent for the  next 3 months.  Hyperlipidemia Lovastatin could be causing or aggravating myalgias, recommend holding med for 3 week and monitor for changes. If resolves, she can resume 1/2 of current dose. We can consider changing to a different statin if myalgias are caused by statin and not better with lower dose. Continue low fat diet. Will plan on checking FLP next visit.  Atherosclerosis of aorta ([place]) Seen on CXR in 03/2020. She is on Lovastatin and Aspirin 81 mg daily.  Prediabetes A healthy life style encouraged for diabetes prevention. Further recommendations according to HgA1C result.  Acquired hypothyroidism Last TSH abnormal. Continue same dose Levothyroxine 30-60 min before breakfast.  I spent a total of 45 minutes in both face to face and non face to face activities for this visit on the date of this encounter. During this time history was obtained and documented, examination was performed, prior labs reviewed, and assessment/plan discussed.  Return if symptoms worsen or fail to improve, for Depending of lab results.  Ava Deguire G. Martinique, MD  [facility]. [place] office.

## 2021-10-31 ENCOUNTER — Ambulatory Visit (INDEPENDENT_AMBULATORY_CARE_PROVIDER_SITE_OTHER): Payer: Medicare Other | Admitting: Family Medicine

## 2021-10-31 ENCOUNTER — Encounter: Payer: Self-pay | Admitting: Family Medicine

## 2021-10-31 VITALS — BP 120/78 | HR 81 | Resp 16 | Ht 62.0 in | Wt 152.1 lb

## 2021-10-31 DIAGNOSIS — R7303 Prediabetes: Secondary | ICD-10-CM | POA: Diagnosis not present

## 2021-10-31 DIAGNOSIS — Z01818 Encounter for other preprocedural examination: Secondary | ICD-10-CM | POA: Diagnosis not present

## 2021-10-31 DIAGNOSIS — E039 Hypothyroidism, unspecified: Secondary | ICD-10-CM | POA: Insufficient documentation

## 2021-10-31 DIAGNOSIS — I700 Atherosclerosis of aorta: Secondary | ICD-10-CM

## 2021-10-31 DIAGNOSIS — E785 Hyperlipidemia, unspecified: Secondary | ICD-10-CM | POA: Diagnosis not present

## 2021-10-31 DIAGNOSIS — R519 Headache, unspecified: Secondary | ICD-10-CM | POA: Diagnosis not present

## 2021-10-31 DIAGNOSIS — G4700 Insomnia, unspecified: Secondary | ICD-10-CM

## 2021-10-31 DIAGNOSIS — M7918 Myalgia, other site: Secondary | ICD-10-CM

## 2021-10-31 NOTE — Assessment & Plan Note (Signed)
Last TSH abnormal. Continue same dose Levothyroxine 30-60 min before breakfast.

## 2021-10-31 NOTE — Assessment & Plan Note (Signed)
A healthy life style encouraged for diabetes prevention. Further recommendations according to HgA1C result. 

## 2021-10-31 NOTE — Assessment & Plan Note (Addendum)
Lovastatin could be causing or aggravating myalgias, recommend holding med for 3 week and monitor for changes. If resolves, she can resume 1/2 of current dose. We can consider changing to a different statin if myalgias are caused by statin and not better with lower dose. Continue low fat diet. Will plan on checking FLP next visit.

## 2021-10-31 NOTE — Patient Instructions (Addendum)
A few things to remember from today's visit:   Pre-op exam  Acquired hypothyroidism  Myalgia, multiple sites - Plan: CK  Hyperlipidemia, unspecified hyperlipidemia type - Plan: Comprehensive metabolic panel  Temporal headache - Plan: CBC, C-reactive protein, Sedimentation rate  Atherosclerosis of aorta ([place]), Chronic  Prediabetes - Plan: Hemoglobin A1c  If you need refills please call your pharmacy. Do not use My Chart to request refills or for acute issues that need immediate attention.  Melatonin 5-15 mg 2 horas anter de dormir. Pare Lovastatin por 3 semanas , ouede estar causando [employer] de musculos. Si mejora empieze la mitad de la dosis. Si no diferencias, empieze la misma dosis.  Please be sure medication list is accurate. If a new problem present, please set up appointment sooner than planned today.

## 2021-10-31 NOTE — Assessment & Plan Note (Addendum)
Seen on CXR in 03/2020. She is on Lovastatin and Aspirin 81 mg daily. 

## 2021-11-01 ENCOUNTER — Telehealth: Payer: Self-pay | Admitting: Urology

## 2021-11-01 LAB — SEDIMENTATION RATE: Sed Rate: 17 mm/hr (ref 0–30)

## 2021-11-01 LAB — COMPREHENSIVE METABOLIC PANEL
ALT: 17 U/L (ref 0–35)
AST: 26 U/L (ref 0–37)
Albumin: 4.5 g/dL (ref 3.5–5.2)
Alkaline Phosphatase: 94 U/L (ref 39–117)
BUN: 18 mg/dL (ref 6–23)
CO2: 29 mEq/L (ref 19–32)
Calcium: 9.9 mg/dL (ref 8.4–10.5)
Chloride: 102 mEq/L (ref 96–112)
Creatinine, Ser: 0.68 mg/dL (ref 0.40–1.20)
GFR: 89.77 mL/min (ref >60.00)
Glucose, Bld: 98 mg/dL (ref 70–99)
Potassium: 3.8 mEq/L (ref 3.5–5.1)
Sodium: 140 mEq/L (ref 135–145)
Total Bilirubin: 0.4 mg/dL (ref 0.2–1.2)
Total Protein: 7.6 g/dL (ref 6.0–8.3)

## 2021-11-01 LAB — HEMOGLOBIN A1C: Hgb A1c MFr Bld: 6.4 % (ref 4.6–6.5)

## 2021-11-01 LAB — CBC
HCT: 41.5 % (ref 36.0–46.0)
Hemoglobin: 14.1 g/dL (ref 12.0–15.0)
MCHC: 33.9 g/dL (ref 30.0–36.0)
MCV: 91.8 fl (ref 78.0–100.0)
Platelets: 224 10*3/uL (ref 150.0–400.0)
RBC: 4.52 Mil/uL (ref 3.87–5.11)
RDW: 13.8 % (ref 11.5–15.5)
WBC: 6.1 10*3/uL (ref 4.0–10.5)

## 2021-11-01 LAB — C-REACTIVE PROTEIN: CRP: <1 mg/dL (ref 0.5–20.0)

## 2021-11-01 LAB — CK: Total CK: 142 U/L (ref 7–177)

## 2021-11-01 NOTE — Telephone Encounter (Signed)
DOS -  11/25/21  AIKEN OSTEOTOMY LEFT --- 63943 LAPIDUS PROC. INC BUNIONECTOMY LEFT --- 20037 HAMMERTOE REPAIR 2ND LEFT --- [id number]  [facility] EFFECTIVE DATE - 09/04/21  PLAN DEDUCTIBLE - $0.00 OUT OF POCKET - $8,300.00 W/ $9,444.61 REMAINING COINSURANCE - 20% COPAY - $0.00  PER [employer] WEBSITE CPT CODES 90122, [id number] AND 64314 HAS BEEN APPROVED, AUTH # [id number], GOOD FROM 11/25/21 - 02/23/22.

## 2021-11-02 ENCOUNTER — Telehealth: Payer: Self-pay | Admitting: Family Medicine

## 2021-11-02 NOTE — Telephone Encounter (Signed)
I called and spoke with pt's daughter. She will try the ibuprofen. I also scheduled pt an appointment for tomorrow afternoon at 3pm to see Dr. Jerilee Hoh to eval the pain since pcp is out of office. ?

## 2021-11-02 NOTE — Telephone Encounter (Signed)
Tylenol is the safer for her but given the fact her renal function was normal, she can take Ibuprofen 400 mg (2 gel caps OTC) 2 times daily as needed for 7-10 days. ?If pain gets worse or is not any better she will need to post pone foot surgery until we determine cause of pain. ?F/U in 2 weeks if pain continues. ?Thanks, ?BJ ?

## 2021-11-02 NOTE — Telephone Encounter (Signed)
Patient daughter Darrold Span called in requesting a phone call back from someone regarding her mom. Patient was seen on 02/27 with Dr.Jordan and expressed the pain she was feeling. Patient stated that Dr.Jordan was supposed to send her something in for the pain but she didn't. ? ?Patient daughter would like to be contacted at [phone]. ? ?Please advise. ?

## 2021-11-02 NOTE — Telephone Encounter (Signed)
I spoke with pt's daughter. The tylenol is not helping pt. She was up all night because she couldn't sleep due to the pain. Daughter wants to know if there is anything you can send in to help with the pain? ?

## 2021-11-02 NOTE — Telephone Encounter (Signed)
I saw her for pre op evaluation, she reported pain in muscles and "bones". I did not make specific recommendations in regard to pain med except for Tylenol, which she can take 3-4 times per day prn. I did recommend holding on statin medication for 3 weeks and see if pain gets better. I also recommended Melatonin to help with sleep. ?Labs done were otherwise normal, except for mildly elevated HgA1C, still prediabetes but going up. We can repeat lab during her next CPE. ?Thanks, ?BJ ?

## 2021-11-03 ENCOUNTER — Other Ambulatory Visit: Payer: Self-pay | Admitting: Internal Medicine

## 2021-11-03 ENCOUNTER — Encounter: Payer: Self-pay | Admitting: Internal Medicine

## 2021-11-03 ENCOUNTER — Ambulatory Visit (INDEPENDENT_AMBULATORY_CARE_PROVIDER_SITE_OTHER): Payer: Medicare Other | Admitting: Internal Medicine

## 2021-11-03 VITALS — BP 130/84 | HR 75 | Temp 97.5°F | Wt 153.9 lb

## 2021-11-03 DIAGNOSIS — E039 Hypothyroidism, unspecified: Secondary | ICD-10-CM

## 2021-11-03 DIAGNOSIS — M5441 Lumbago with sciatica, right side: Secondary | ICD-10-CM

## 2021-11-03 MED ORDER — MELOXICAM 7.5 MG PO TABS: 7.5000 mg | ORAL_TABLET | Freq: Every day | ORAL | 0 refills | Status: DC

## 2021-11-03 MED ORDER — PREDNISONE 10 MG (21) PO TBPK: ORAL_TABLET | ORAL | 0 refills | Status: DC

## 2021-11-03 NOTE — Patient Instructions (Signed)
-  Prednisona como indica el paquete. ? ?-Meloxicam 1 tableta diaria por 10 dias. ? ?-[employer] ejercicios de la espalda todos los dias. ?

## 2021-11-03 NOTE — Progress Notes (Signed)
? ? ? ?Acute office Visit ? ? ? ? ?This visit occurred during the SARS-CoV-2 public health emergency.  Safety protocols were in place, including screening questions prior to the visit, additional usage of staff PPE, and extensive cleaning of exam room while observing appropriate contact time as indicated for disinfecting solutions.  ? ? ?CC/Reason for Visit: Discuss low back pain ? ?HPI: Tiffany Berg is a 66 y.o. female who is coming in today for the above mentioned reasons.  For the past week or so she has been having significant right-sided back pain radiating down the posterior and lateral part of her thigh all the way down into her foot.  Pain is significant and not allowing her to sleep or walk.  Her TSH was high in January and she has not yet had it rechecked.  I do not see a B12 level on file. ? ?Past Medical/Surgical History: ?Past Medical History:  ?Diagnosis Date  ? 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 cardiologist--- dr Curt Bears---  ED 03-12-2020 presyncope, sob, some chest pain w/ exertion, found to have symptomatic bradycardia and second degree av block type 2  ? Depression   ? undiagnosed- pt feels like crying sometimes for no reason  ? Ductal carcinoma in situ (DCIS) of left breast oncologist--  dr c. Faythe Ghee [facility])  ? dx 10/ 2014  DCIS left breast , ER/PR+;  07-22-2013 s/p  left breast partial mastectomy w/ sln dissection's and  08-12-2013 s/p left breast lumpecotmy (excision another site);  completed radiation 02/ 2015  ? Headache   ? Hyperlipidemia   ? Hypothyroidism   ? Mobitz type 2 second degree heart block 03/12/2020  ? w/ symptomatic bradycardia, rate 30s;  s/p  PPM 03-14-2020  ? Pre-diabetes   ? Symptomatic bradycardia 03/12/2020  ? Wears glasses   ? Wears partial dentures   ? upper and lower  ? ? ?Past Surgical History:  ?Procedure Laterality Date  ? AIKEN OSTEOTOMY Right 10/15/2020  ? Procedure: [employer];  Surgeon: Criselda Peaches, DPM;  Location:  [facility];  Service: Podiatry;  Laterality: Right;  REGIONAL BLOCK  ? ANTERIOR AND POSTERIOR REPAIR N/A 02/18/2015  ? Procedure: ANTERIOR (CYSTOCELE) AND POSTERIOR REPAIR (RECTOCELE);  Surgeon: Everett Graff, MD;  Location: [place] [facility];  Service: Gynecology;  Laterality: N/A;  ? BLADDER SUSPENSION N/A 02/18/2015  ? Procedure: TRANSVAGINAL TAPE (TVT) PROCEDURE;  Surgeon: Everett Graff, MD;  Location: [place] [facility];  Service: Gynecology;  Laterality: N/A;  ? CYSTOSCOPY Bilateral 02/18/2015  ? Procedure: CYSTOSCOPY;  Surgeon: Everett Graff, MD;  Location: [place] [facility];  Service: Gynecology;  Laterality: Bilateral;  ? HALLUX VALGUS LAPIDUS Right 10/15/2020  ? Procedure: HALLUX VALGUS LAPIDUS AND BONE GRAFT;  Surgeon: Criselda Peaches, DPM;  Location: [place];  Service: Podiatry;  Laterality: Right;  WITH IV SEDATION; REGIONAL BLOCK  ? HAMMER TOE SURGERY Right 10/15/2020  ? Procedure: HAMMER TOE CORRECTION;  Surgeon: Criselda Peaches, DPM;  Location: [place];  Service: Podiatry;  Laterality: Right;  REGIONAL BLOCK  ? LAPAROSCOPIC ASSISTED VAGINAL HYSTERECTOMY N/A 02/18/2015  ? Procedure: LAPAROSCOPIC ASSISTED VAGINAL HYSTERECTOMY;  Surgeon: Everett Graff, MD;  Location: [place] [facility];  Service: Gynecology;  Laterality: N/A;  ? MASTECTOMY, PARTIAL Left 08-12-2013  @[facility]   ? excision another site  ? PACEMAKER IMPLANT N/A 03/14/2020  ? Procedure: PACEMAKER IMPLANT;  Surgeon: Constance Haw, MD;  Location: [place] [facility];  Service: Cardiovascular;  Laterality: N/A;  ?  RADIOACTIVE SEED GUIDED PARTIAL MASTECTOMY/AXILLARY SENTINEL NODE BIOPSY/AXILLARY NODE DISSECTION Left 07-22-2013  @[facility]   ? SALPINGOOPHORECTOMY Bilateral 02/18/2015  ? Procedure: BILATERAL SALPINGO OOPHORECTOMY;  Surgeon: Everett Graff, MD;  Location: [place] [facility];  Service: Gynecology;  Laterality: Bilateral;  ? ? ?Social History: ? reports that she has never smoked. She has never used smokeless tobacco. She reports current  alcohol use. She reports that she does not use drugs. ? ?Allergies: ?No Known Allergies ? ?Family History:  ?Family History  ?Problem Relation Age of Onset  ? Heart disease Mother   ? Cancer Father   ?     prostata  ? Cancer Sister 23  ?     Breast  ? ? ? ?Current Outpatient Medications:  ?  B Complex Vitamins (VITAMIN B COMPLEX PO), Take by mouth., Disp: , Rfl:  ?  levothyroxine (EUTHYROX) 75 MCG tablet, Take 1 tablet (75 mcg total) by mouth daily before breakfast., Disp: 90 tablet, Rfl: 3 ?  lovastatin (MEVACOR) 40 MG tablet, TAKE 2 TABLETS BY MOUTH AT BEDTIME, Disp: 180 tablet, Rfl: 1 ?  meloxicam (MOBIC) 7.5 MG tablet, Take 1 tablet (7.5 mg total) by mouth daily. Take daily for 10 days., Disp: 30 tablet, Rfl: 0 ?  Misc. Devices (CRUTCH HANDGRIPS) MISC, 1 Units by Does not apply route as directed. To use after foot surgery., Disp: 1 each, Rfl: 0 ?  predniSONE (STERAPRED UNI-PAK 21 TAB) 10 MG (21) TBPK tablet, Take as directed., Disp: 21 tablet, Rfl: 0 ?  tiZANidine (ZANAFLEX) 2 MG tablet, Take 1-2 tablets (2-4 mg total) by mouth every 6 (six) hours as needed for muscle spasms., Disp: 60 tablet, Rfl: 1 ?  fluticasone (FLONASE) 50 MCG/ACT nasal spray, Place 1 spray into both nostrils 2 (two) times daily., Disp: 16 g, Rfl: 0 ? ?Review of Systems:  ?Constitutional: Denies fever, chills, diaphoresis, appetite change and fatigue.  ?HEENT: Denies photophobia, eye pain, redness, hearing loss, ear pain, congestion, sore throat, rhinorrhea, sneezing, mouth sores, trouble swallowing, neck pain, neck stiffness and tinnitus.   ?Respiratory: Denies SOB, DOE, cough, chest tightness,  and wheezing.   ?Cardiovascular: Denies chest pain, palpitations and leg swelling.  ?Gastrointestinal: Denies nausea, vomiting, abdominal pain, diarrhea, constipation, blood in stool and abdominal distention.  ?Genitourinary: Denies dysuria, urgency, frequency, hematuria, flank pain and difficulty urinating.  ?Endocrine: Denies: hot or cold  intolerance, sweats, changes in hair or nails, polyuria, polydipsia. ?Musculoskeletal: Denies  joint swelling. ?Skin: Denies pallor, rash and wound.  ?Neurological: Denies dizziness, seizures, syncope, weakness, light-headedness, numbness and headaches.  ?Hematological: Denies adenopathy. Easy bruising, personal or family bleeding history  ?Psychiatric/Behavioral: Denies suicidal ideation, mood changes, confusion, nervousness, sleep disturbance and agitation ? ? ? ?Physical Exam: ?Vitals:  ? 11/03/21 1502  ?BP: 130/84  ?Pulse: 75  ?Temp: (!) 97.5 ?F (36.4 ?C)  ?TempSrc: Oral  ?SpO2: 95%  ?Weight: 153 lb 14.4 oz (69.8 kg)  ? ? ?Body mass index is 28.15 kg/m?. ? ? ?Constitutional: NAD, calm, comfortable ?Eyes: PERRL, lids and conjunctivae normal ?ENMT: Mucous membranes are moist.  ?Psychiatric: Normal judgment and insight. Alert and oriented x 3. Normal mood.  ? ? ?Impression and Plan: ? ?Acute right-sided low back pain with right-sided sciatica  ?- Plan: Vitamin B12, meloxicam (MOBIC) 7.5 MG tablet, predniSONE (STERAPRED UNI-PAK 21 TAB) 10 MG (21) TBPK tablet ?-Pain is classic sciatica, I have decided to treat her with meloxicam and prednisone taper. ?-She at times will also have pain in her arms, I wonder about B12  deficiency, her TSH was also elevated in January, hypothyroidism can also lead to muscle aches, check TSH and B12 levels today. ? ?Acquired hypothyroidism ? - Plan: TSH ? ?Time spent: 30 minutes reviewing chart, interviewing and examining patient and formulating plan of care. ? ? ?Patient Instructions  ?-Prednisona como indica el paquete. ? ?-Meloxicam 1 tableta diaria por 10 dias. ? ?-[employer] ejercicios de la espalda todos los dias. ? ? ? ?Lelon Frohlich, MD ?[facility] ? ? ?

## 2021-11-11 DIAGNOSIS — H40013 Open angle with borderline findings, low risk, bilateral: Secondary | ICD-10-CM | POA: Diagnosis not present

## 2021-11-23 ENCOUNTER — Encounter (HOSPITAL_BASED_OUTPATIENT_CLINIC_OR_DEPARTMENT_OTHER): Payer: Self-pay | Admitting: Podiatry

## 2021-11-23 NOTE — Progress Notes (Signed)
Pt is schedule for surgery 11-25-2021 by Dr Thomasene Lot @ [facility].  Pt has PPM, sent inbox message in epic to cardiac device clinic for clearance.  Received message to unable to clear pt since see has not seen her cardiologist since 06-29-2020 (per note pt was to follow up in 9 months, pt did not and does not have an upcoming appointment).   Pt will have to have office visit prior to getting device clearance. ?Called and spoke w [facility], OR scheduler for Dr Britt Bottom office, informed her about pt needing cardiology visit to getting cardiac device clearance for surgery.  Stated she will let surgeon know. ?

## 2021-11-25 ENCOUNTER — Ambulatory Visit (HOSPITAL_BASED_OUTPATIENT_CLINIC_OR_DEPARTMENT_OTHER): Admission: RE | Admit: 2021-11-25 | Payer: Medicare Other | Source: Home / Self Care | Admitting: Podiatry

## 2021-11-25 HISTORY — DX: Insomnia, unspecified: G47.00

## 2021-11-25 HISTORY — DX: Myalgia, other site: M79.18

## 2021-11-25 SURGERY — BUNIONECTOMY, LAPIDUS
Anesthesia: Choice | Site: Toe | Laterality: Left

## 2021-12-01 ENCOUNTER — Encounter: Payer: Medicare Other | Admitting: Sports Medicine

## 2021-12-06 ENCOUNTER — Telehealth: Payer: Self-pay | Admitting: Cardiology

## 2021-12-06 NOTE — Telephone Encounter (Signed)
? ?  Pre-operative Risk Assessment  ?  ?Patient Name: Tiffany Berg  ?DOB: [birth year] ?MRN: [id number]  ? ?  ? ?Request for Surgical Clearance   ? ?Procedure:  L Foot bunion correction (Lapidus Akin ),  L foot 2nd toe hammertoe correction and bone graft  ? ?Date of Surgery:  Clearance TBD                              ?   ?Surgeon:  Dr. Lanae Crumbly ?Surgeon's Group or Practice Name:  [facility] ?Phone number:  [phone] ?Fax number:  [phone] ?  ?Type of Clearance Requested:   ?- Medical  ?- Pharmacy:  Hold TBD by Cardiology ?  ?Type of Anesthesia:  choice  ?  ?Additional requests/questions:   ? ?Signed, ?Johnna Acosta   ?12/06/2021, 9:58 AM   ?

## 2021-12-06 NOTE — Telephone Encounter (Signed)
Patient has upcoming visit with Dr. Curt Bears in 2 days, will defer final cardiac clearance to MD and remove this request from the pool. ?

## 2021-12-08 ENCOUNTER — Ambulatory Visit (INDEPENDENT_AMBULATORY_CARE_PROVIDER_SITE_OTHER): Payer: Medicare Other | Admitting: Cardiology

## 2021-12-08 ENCOUNTER — Encounter: Payer: Self-pay | Admitting: Cardiology

## 2021-12-08 VITALS — BP 138/94 | HR 82 | Ht 62.0 in | Wt 152.6 lb

## 2021-12-08 DIAGNOSIS — I442 Atrioventricular block, complete: Secondary | ICD-10-CM

## 2021-12-08 NOTE — Progress Notes (Deleted)
? ?Subjective:  ? Tiffany Berg is a 66 y.o. female who presents for Medicare Annual (Subsequent) preventive examination. ? ?Review of Systems    ?*** ?  ? ?   ?Objective:  ?  ?There were no vitals filed for this visit. ?There is no height or weight on file to calculate BMI. ? ? ?  10/15/2020  ?  9:08 AM 03/12/2020  ?  8:32 PM 11/14/2019  ?  9:33 AM 02/19/2015  ?  3:00 AM 02/17/2015  ? 10:18 AM  ?Advanced Directives  ?Does Patient Have a Medical Advance Directive? No No No No No  ?Would patient like information on creating a medical advance directive? Yes (MAU/Ambulatory/Procedural Areas - Information given) No - Patient declined Yes (Inpatient - patient defers creating a medical advance directive at this time - Information given) Yes - Educational materials given Yes - Educational materials given  ? ? ?Current Medications (verified) ?Outpatient Encounter Medications as of 12/08/2021  ?Medication Sig  ? B Complex Vitamins (VITAMIN B COMPLEX PO) Take by mouth.  ? fluticasone (FLONASE) 50 MCG/ACT nasal spray Place 1 spray into both nostrils 2 (two) times daily.  ? levothyroxine (EUTHYROX) 75 MCG tablet Take 1 tablet (75 mcg total) by mouth daily before breakfast.  ? lovastatin (MEVACOR) 40 MG tablet TAKE 2 TABLETS BY MOUTH AT BEDTIME  ? meloxicam (MOBIC) 7.5 MG tablet TAKE 1 TABLET BY MOUTH ONCE DAILY FOR 10 DAYS  ? predniSONE (STERAPRED UNI-PAK 21 TAB) 10 MG (21) TBPK tablet Take as directed.  ? tiZANidine (ZANAFLEX) 2 MG tablet Take 1-2 tablets (2-4 mg total) by mouth every 6 (six) hours as needed for muscle spasms.  ? ?No facility-administered encounter medications on file as of 12/08/2021.  ? ? ?Allergies (verified) ?Patient has no known allergies.  ? ?History: ?Past Medical History:  ?Diagnosis Date  ? 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 cardiologist--- dr Curt Bears---  ED 03-12-2020 presyncope, sob, some chest pain w/ exertion, found to have symptomatic bradycardia and second degree av block  type 2  ? Ductal carcinoma in situ (DCIS) of left breast   ? oncologist-- dr c. Faythe Ghee ([facility]);  dx 10/ 2014  DCIS left breast , ER/PR+;  07-22-2013 s/p  left breast partial mastectomy w/ sln dissection's and  08-12-2013 s/p left breast lumpecotmy (excision another site);  completed radiation 02/ 2015  ? Headache   ? Hyperlipidemia   ? Hypothyroidism   ? Insomnia   ? Mobitz type 2 second degree heart block 03/12/2020  ? w/ symptomatic bradycardia, rate 30s;  s/p  PPM 03-14-2020  ? Myalgia, multiple sites   ? Pre-diabetes   ? Symptomatic bradycardia 03/12/2020  ? s/p PPM  ? Wears glasses   ? Wears partial dentures   ? upper and lower  ? ?Past Surgical History:  ?Procedure Laterality Date  ? AIKEN OSTEOTOMY Right 10/15/2020  ? Procedure: [employer];  Surgeon: Criselda Peaches, DPM;  Location: [facility];  Service: Podiatry;  Laterality: Right;  REGIONAL BLOCK  ? ANTERIOR AND POSTERIOR REPAIR N/A 02/18/2015  ? Procedure: ANTERIOR (CYSTOCELE) AND POSTERIOR REPAIR (RECTOCELE);  Surgeon: Everett Graff, MD;  Location: [place] [facility];  Service: Gynecology;  Laterality: N/A;  ? BLADDER SUSPENSION N/A 02/18/2015  ? Procedure: TRANSVAGINAL TAPE (TVT) PROCEDURE;  Surgeon: Everett Graff, MD;  Location: [place] [facility];  Service: Gynecology;  Laterality: N/A;  ? CYSTOSCOPY Bilateral 02/18/2015  ? Procedure: CYSTOSCOPY;  Surgeon: Everett Graff, MD;  [facility]: [facility];  Service: Gynecology;  Laterality: Bilateral;  ? HALLUX VALGUS LAPIDUS Right 10/15/2020  ? Procedure: HALLUX VALGUS LAPIDUS AND BONE GRAFT;  Surgeon: Criselda Peaches, DPM;  Location: [place];  Service: Podiatry;  Laterality: Right;  WITH IV SEDATION; REGIONAL BLOCK  ? HAMMER TOE SURGERY Right 10/15/2020  ? Procedure: HAMMER TOE CORRECTION;  Surgeon: Criselda Peaches, DPM;  Location: [place];  Service: Podiatry;  Laterality: Right;  REGIONAL BLOCK  ? LAPAROSCOPIC ASSISTED VAGINAL HYSTERECTOMY N/A 02/18/2015  ? Procedure:  LAPAROSCOPIC ASSISTED VAGINAL HYSTERECTOMY;  Surgeon: Everett Graff, MD;  Location: [place] [facility];  Service: Gynecology;  Laterality: N/A;  ? MASTECTOMY, PARTIAL Left 08-12-2013  '@[facility]'$   ? excision another site  ? PACEMAKER IMPLANT N/A 03/14/2020  ? Procedure: PACEMAKER IMPLANT;  Surgeon: Constance Haw, MD;  Location: [place] [facility];  Service: Cardiovascular;  Laterality: N/A;  ? RADIOACTIVE SEED GUIDED PARTIAL MASTECTOMY/AXILLARY SENTINEL NODE BIOPSY/AXILLARY NODE DISSECTION Left 07-22-2013  '@[facility]'$   ? SALPINGOOPHORECTOMY Bilateral 02/18/2015  ? Procedure: BILATERAL SALPINGO OOPHORECTOMY;  Surgeon: Everett Graff, MD;  Location: [place] [facility];  Service: Gynecology;  Laterality: Bilateral;  ? ?Family History  ?Problem Relation Age of Onset  ? Heart disease Mother   ? Cancer Father   ?     prostata  ? Cancer Sister 21  ?     Breast  ? ?Social History  ? ?Socioeconomic History  ? Marital status: Legally Separated  ?  Spouse name: Not on file  ? Number of children: Not on file  ? Years of education: Not on file  ? Highest education level: Not on file  ?Occupational History  ? Not on file  ?Tobacco Use  ? Smoking status: Never  ? Smokeless tobacco: Never  ?Vaping Use  ? Vaping Use: Never used  ?Substance and Sexual Activity  ? Alcohol use: Yes  ?  Comment: socially  ? Drug use: Never  ? Sexual activity: Yes  ?  Birth control/protection: Post-menopausal, Surgical  ?Other Topics Concern  ? Not on file  ?Social History Narrative  ? Not on file  ? ?Social Determinants of Health  ? ?Financial Resource Strain: Not on file  ?Food Insecurity: Not on file  ?Transportation Needs: Not on file  ?Physical Activity: Not on file  ?Stress: Not on file  ?Social Connections: Not on file  ? ? ?Tobacco Counseling ?Counseling given: Not Answered ? ? ?Clinical Intake: ? ?  ? ?  ? ?  ? ?  ? ?  ? ?Diabetic?*** ? ?  ? ?  ? ? ?Activities of Daily Living ?   ? View : No data to display.  ?  ?  ?  ? ? ?Patient Care Team: ?Martinique, Betty G, MD as PCP -  General (Family Medicine) ?Jerline Pain, MD as PCP - Cardiology (Cardiology) ?Constance Haw, MD as PCP - Electrophysiology (Cardiology) ? ?Indicate any recent Medical Services you may have received from other than [facility] providers in the past year (date may be approximate). ? ?   ?Assessment:  ? This is a routine wellness examination for Tiffany Berg. ? ?Hearing/Vision screen ?No results found. ? ?Dietary issues and exercise activities discussed: ?  ? ? Goals Addressed   ?None ?  ? ?Depression Screen ? ?  10/31/2021  ?  4:02 PM 12/03/2020  ?  2:39 PM 09/02/2020  ?  9:32 PM 08/20/2019  ? 10:46 AM  ?PHQ 2/9 Scores  ?PHQ - 2 Score 2 0  0 0  ?PHQ- 9 Score 7     ?  ?Fall Risk ? ?  12/03/2020  ?  2:37 PM 09/29/2020  ?  9:28 AM 08/20/2019  ? 10:46 AM  ?Fall Risk   ?Falls in the past year? 0 0 1  ?Number falls in past yr: 0  0  ?Injury with Fall? 0  0  ?Risk for fall due to :   History of fall(s)  ?Follow up Falls evaluation completed  Education provided  ? ? ?FALL RISK PREVENTION PERTAINING TO THE HOME: ? ?Any stairs in or around the home? {YES/NO:21197} ?If so, are there any without handrails? {YES/NO:21197} ?Home free of loose throw rugs in walkways, pet beds, electrical cords, etc? {YES/NO:21197} ?Adequate lighting in your home to reduce risk of falls? {YES/NO:21197} ? ?ASSISTIVE DEVICES UTILIZED TO PREVENT FALLS: ? ?Life alert? {YES/NO:21197} ?Use of a cane, walker or w/c? {YES/NO:21197} ?Grab bars in the bathroom? {YES/NO:21197} ?Shower chair or bench in shower? {YES/NO:21197} ?Elevated toilet seat or a handicapped toilet? {YES/NO:21197} ? ?TIMED UP AND GO: ? ?Was the test performed? {YES/NO:21197}.  ?Length of time to ambulate 10 feet: *** sec.  ? ?{Appearance of Gait:2101803} ? ?Cognitive Function: ?  ?  ?  ? ?Immunizations ?Immunization History  ?Administered Date(s) Administered  ? Fluad Quad(high Dose 65+) 08/20/2019  ? Influenza, Seasonal, Injecte, Preservative Fre 07/16/2012  ? Influenza-Unspecified 06/17/2013  ?  Pneumococcal Polysaccharide-23 08/20/2019  ? ? ?{TDAP status:2101805} ? ?{Flu Vaccine status:2101806} ? ?{Pneumococcal vaccine status:2101807} ? ?{Covid-19 vaccine status:2101808} ? ?Qualifies for Shingles Vacci

## 2021-12-08 NOTE — Progress Notes (Signed)
? ?Electrophysiology Office Note ? ? ?Date:  12/08/2021  ? ?IDHartlyn Berg, DOB 03-02-1956, MRN [id number] ? ?PCP:  Martinique, Betty G, MD  ?Cardiologist:   ?Primary Electrophysiologist:  Tiffany Knighton Meredith Leeds, MD   ? ?Chief Complaint: pacemaker ?  ?History of Present Illness: ?Tiffany Berg is a 66 y.o. female who is being seen today for the evaluation of pacemaker at the request of Martinique, Tiffany So, MD. Presenting today for electrophysiology evaluation. ? ?She has a history significant for hyperlipidemia, hypothyroidism, breast cancer, depression.  She presented to the hospital 03/12/2020 from her PCPs office due to bradycardia.  She developed dyspnea on exertion, presyncope with chest pain on exertion.  Her heart rate was found to be in the 30s.  She is now status post Medtronic dual-chamber pacemaker implanted 03/14/2020.  She has plans for left foot surgery upcoming.  She is fortunately able to do all of her daily activities.  She has no chest pain or shortness of breath.  She is able to exert herself, walking up and down stairs without issue. ? ?Today, denies symptoms of palpitations, chest pain, shortness of breath, orthopnea, PND, lower extremity edema, claudication, dizziness, presyncope, syncope, bleeding, or neurologic sequela. The patient is tolerating medications without difficulties.  Since being seen she has done well.  She has had no chest pain or shortness of breath.  She able do all her daily activities.  She is able to climb stairs and walk on flat ground without restriction.  She continues to work. ? ? ?Past Medical History:  ?Diagnosis Date  ? 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 cardiologist--- dr Curt Bears---  ED 03-12-2020 presyncope, sob, some chest pain w/ exertion, found to have symptomatic bradycardia and second degree av block type 2  ? Ductal carcinoma in situ (DCIS) of left breast   ? oncologist-- dr c. Faythe Ghee ([facility]);  dx 10/ 2014  DCIS left breast , ER/PR+;   07-22-2013 s/p  left breast partial mastectomy w/ sln dissection's and  08-12-2013 s/p left breast lumpecotmy (excision another site);  completed radiation 02/ 2015  ? Headache   ? Hyperlipidemia   ? Hypothyroidism   ? Insomnia   ? Mobitz type 2 second degree heart block 03/12/2020  ? w/ symptomatic bradycardia, rate 30s;  s/p  PPM 03-14-2020  ? Myalgia, multiple sites   ? Pre-diabetes   ? Symptomatic bradycardia 03/12/2020  ? s/p PPM  ? Wears glasses   ? Wears partial dentures   ? upper and lower  ? ?Past Surgical History:  ?Procedure Laterality Date  ? AIKEN OSTEOTOMY Right 10/15/2020  ? Procedure: [employer];  Surgeon: Criselda Peaches, DPM;  Location: [facility];  Service: Podiatry;  Laterality: Right;  REGIONAL BLOCK  ? ANTERIOR AND POSTERIOR REPAIR N/A 02/18/2015  ? Procedure: ANTERIOR (CYSTOCELE) AND POSTERIOR REPAIR (RECTOCELE);  Surgeon: Everett Graff, MD;  Location: [facility];  Service: Gynecology;  Laterality: N/A;  ? BLADDER SUSPENSION N/A 02/18/2015  ? Procedure: TRANSVAGINAL TAPE (TVT) PROCEDURE;  Surgeon: Everett Graff, MD;  Location: [facility];  Service: Gynecology;  Laterality: N/A;  ? CYSTOSCOPY Bilateral 02/18/2015  ? Procedure: CYSTOSCOPY;  Surgeon: Everett Graff, MD;  Location: [facility];  Service: Gynecology;  Laterality: Bilateral;  ? HALLUX VALGUS LAPIDUS Right 10/15/2020  ? Procedure: HALLUX VALGUS LAPIDUS AND BONE GRAFT;  Surgeon: Criselda Peaches, DPM;  Location: [facility];  Service: Podiatry;  Laterality: Right;  WITH IV SEDATION; REGIONAL  BLOCK  ? HAMMER TOE SURGERY Right 10/15/2020  ? Procedure: HAMMER TOE CORRECTION;  Surgeon: Criselda Peaches, DPM;  Location: [facility];  Service: Podiatry;  Laterality: Right;  REGIONAL BLOCK  ? LAPAROSCOPIC ASSISTED VAGINAL HYSTERECTOMY N/A 02/18/2015  ? Procedure: LAPAROSCOPIC ASSISTED VAGINAL HYSTERECTOMY;  Surgeon: Everett Graff, MD;  Location: [facility];  Service: Gynecology;  Laterality: N/A;  ? MASTECTOMY,  PARTIAL Left 08-12-2013  '@[facility]'$   ? excision another site  ? PACEMAKER IMPLANT N/A 03/14/2020  ? Procedure: PACEMAKER IMPLANT;  Surgeon: Constance Haw, MD;  Location: [facility];  Service: Cardiovascular;  Laterality: N/A;  ? RADIOACTIVE SEED GUIDED PARTIAL MASTECTOMY/AXILLARY SENTINEL NODE BIOPSY/AXILLARY NODE DISSECTION Left 07-22-2013  '@[facility]'$   ? SALPINGOOPHORECTOMY Bilateral 02/18/2015  ? Procedure: BILATERAL SALPINGO OOPHORECTOMY;  Surgeon: Everett Graff, MD;  Location: [facility];  Service: Gynecology;  Laterality: Bilateral;  ? ? ? ?Current Outpatient Medications  ?Medication Sig Dispense Refill  ? B Complex Vitamins (VITAMIN B COMPLEX PO) Take by mouth.    ? fluticasone (FLONASE) 50 MCG/ACT nasal spray Place 1 spray into both nostrils 2 (two) times daily. 16 Berg 0  ? levothyroxine (EUTHYROX) 75 MCG tablet Take 1 tablet (75 mcg total) by mouth daily before breakfast. 90 tablet 3  ? lovastatin (MEVACOR) 40 MG tablet TAKE 2 TABLETS BY MOUTH AT BEDTIME 180 tablet 1  ? meloxicam (MOBIC) 7.5 MG tablet TAKE 1 TABLET BY MOUTH ONCE DAILY FOR 10 DAYS 30 tablet 0  ? predniSONE (STERAPRED UNI-PAK 21 TAB) 10 MG (21) TBPK tablet Take as directed. 21 tablet 0  ? tiZANidine (ZANAFLEX) 2 MG tablet Take 1-2 tablets (2-4 mg total) by mouth every 6 (six) hours as needed for muscle spasms. 60 tablet 1  ? ?No current facility-administered medications for this visit.  ? ? ?Allergies:   Patient has no known allergies.  ? ?Social History:  The patient  reports that she has never smoked. She has never used smokeless tobacco. She reports current alcohol use. She reports that she does not use drugs.  ? ?Family History:  The patient's family history includes Cancer in her father; Cancer (age of onset: 54) in her sister; Heart disease in her mother.  ? ?ROS:  Please see the history of present illness.   Otherwise, review of systems is positive for none.   All other systems are reviewed and negative.  ? ?PHYSICAL EXAM: ?VS:  BP (!)  138/94   Pulse 82   Ht '5\' 2"'$  (1.575 m)   Wt 152 lb 9.6 oz (69.2 kg)   SpO2 94%   BMI 27.91 kg/m?  , BMI Body mass index is 27.91 kg/m?. ?GEN: Well nourished, well developed, in no acute distress  ?HEENT: normal  ?Neck: no JVD, carotid bruits, or masses ?Cardiac: RRR; no murmurs, rubs, or gallops,no edema  ?Respiratory:  clear to auscultation bilaterally, normal work of breathing ?GI: soft, nontender, nondistended, + BS ?MS: no deformity or atrophy  ?Skin: warm and dry, device site well healed ?Neuro:  Strength and sensation are intact ?Psych: euthymic mood, full affect ? ?EKG:  EKG is ordered today. ?Personal review of the ekg ordered shows sinus rhythm, V paced ? ?Personal review of the device interrogation today. Results in [place]  ? ? ?Recent Labs: ?09/21/2021: TSH 9.07 ?10/31/2021: ALT 17; BUN 18; Creatinine, Ser 0.68; Hemoglobin 14.1; Platelets 224.0; Potassium 3.8; Sodium 140  ? ? ?Lipid Panel  ?   ?Component Value Date/Time  ? CHOL 232 (H) 10/14/2020  1116  ? TRIG 101.0 10/14/2020 1116  ? HDL 52.30 10/14/2020 1116  ? CHOLHDL 4 10/14/2020 1116  ? VLDL 20.2 10/14/2020 1116  ? LDLCALC 160 (H) 10/14/2020 1116  ? ? ? ?Wt Readings from Last 3 Encounters:  ?12/08/21 152 lb 9.6 oz (69.2 kg)  ?11/03/21 153 lb 14.4 oz (69.8 kg)  ?10/31/21 152 lb 2 oz (69 kg)  ?  ? ? ?Other studies Reviewed: ?Additional studies/ records that were reviewed today include: TTE 03/13/2020 ?Review of the above records today demonstrates:  ? 1. Left ventricular ejection fraction, by estimation, is 60 to 65%. The  ?left ventricle has normal function. The left ventricle has no regional  ?wall motion abnormalities. There is mild left ventricular hypertrophy.  ?heart block indeterminate.  ? 2. Right ventricular systolic function is normal. The right ventricular  ?size is normal.  ? 3. The mitral valve is normal in structure. Trivial mitral valve  ?regurgitation. No evidence of mitral stenosis.  ? 4. The aortic valve is normal in structure.  Aortic valve regurgitation is  ?not visualized. Mild aortic valve sclerosis is present, with no evidence  ?of aortic valve stenosis.  ? 5. The inferior vena cava is normal in size with greater than 50%  ?[place] Fl[place]

## 2021-12-08 NOTE — Telephone Encounter (Signed)
Contacted patient on preferred number listed in notes for scheduled AWV.  Patient stated .unable to complete visit today due to daughter/interpreter not available. okay to reschedule. ?

## 2021-12-08 NOTE — Progress Notes (Signed)
This encounter was created in error - please disregard.

## 2021-12-08 NOTE — Patient Instructions (Signed)
Medication Instructions:  ?Your physician recommends that you continue on your current medications as directed. Please refer to the Current Medication list given to you today. ? ?*If you need a refill on your cardiac medications before your next appointment, please call your pharmacy* ? ? ?Lab Work: ?None ordered ? ? ?Testing/Procedures: ?None ordered ? ? ?Follow-Up: ?At [facility], you and your health needs are our priority.  As part of our continuing mission to provide you with exceptional heart care, we have created designated Provider Care Teams.  These Care Teams include your primary Cardiologist (physician) and Advanced Practice Providers (APPs -  Physician Assistants and Nurse Practitioners) who all work together to provide you with the care you need, when you need it. ? ?We recommend signing up for the patient portal called "MyChart".  Sign up information is provided on this After Visit Summary.  MyChart is used to connect with patients for Virtual Visits (Telemedicine).  Patients are able to view lab/test results, encounter notes, upcoming appointments, etc.  Non-urgent messages can be sent to your provider as well.   ?To learn more about what you can do with MyChart, go to [email or web address].   ? ?Remote monitoring is used to monitor your Pacemaker or ICD from home. This monitoring reduces the number of office visits required to check your device to one time per year. It allows [place] to keep an eye on the functioning of your device to ensure it is working properly. You are scheduled for a device check from home on 01/20/2022. You may send your transmission at any time that day. If you have a wireless device, the transmission will be sent automatically. After your physician reviews your transmission, you will receive a postcard with your next transmission date. ? ?Your next appointment:   ?1 year(s) ? ?The format for your next appointment:   ?In Person ? ?Provider:   ?You will see one of the following  Advanced Practice Providers on your designated Care Team:   ?Tommye Standard, PA-C ?Legrand Como "Jonni Sanger" Olney, PA-C ? ? ?Thank you for choosing [facility]!! ? ? ?Trinidad Curet, RN ?([phone] ? ?

## 2021-12-15 ENCOUNTER — Encounter: Payer: Medicare Other | Admitting: Podiatry

## 2021-12-22 ENCOUNTER — Encounter: Payer: Medicare Other | Admitting: Physician Assistant

## 2022-01-05 ENCOUNTER — Encounter: Payer: Medicare Other | Admitting: Podiatry

## 2022-01-20 ENCOUNTER — Ambulatory Visit (INDEPENDENT_AMBULATORY_CARE_PROVIDER_SITE_OTHER): Payer: Medicare Other

## 2022-01-20 DIAGNOSIS — I442 Atrioventricular block, complete: Secondary | ICD-10-CM | POA: Diagnosis not present

## 2022-01-20 LAB — CUP PACEART REMOTE DEVICE CHECK
Battery Remaining Longevity: 107 mo
Battery Voltage: 3 V
Brady Statistic AP VP Percent: 30.89 %
Brady Statistic AP VS Percent: 1.1 %
Brady Statistic AS VP Percent: 59.51 %
Brady Statistic AS VS Percent: 8.5 %
Brady Statistic RA Percent Paced: 31.98 %
Brady Statistic RV Percent Paced: 90.4 %
Date Time Interrogation Session: 20230518230253
Implantable Lead Implant Date: 20210711
Implantable Lead Implant Date: 20210711
Implantable Lead Location: 753859
Implantable Lead Location: 753860
Implantable Lead Model: 5076
Implantable Lead Model: 5076
Implantable Pulse Generator Implant Date: 20210711
Lead Channel Impedance Value: 323 Ohm
Lead Channel Impedance Value: 361 Ohm
Lead Channel Impedance Value: 380 Ohm
Lead Channel Impedance Value: 494 Ohm
Lead Channel Pacing Threshold Amplitude: 0.625 V
Lead Channel Pacing Threshold Amplitude: 0.875 V
Lead Channel Pacing Threshold Pulse Width: 0.4 ms
Lead Channel Pacing Threshold Pulse Width: 0.4 ms
Lead Channel Sensing Intrinsic Amplitude: 3.625 mV
Lead Channel Sensing Intrinsic Amplitude: 3.625 mV
Lead Channel Sensing Intrinsic Amplitude: 4.625 mV
Lead Channel Sensing Intrinsic Amplitude: 4.625 mV
Lead Channel Setting Pacing Amplitude: 1.75 V
Lead Channel Setting Pacing Amplitude: 2.5 V
Lead Channel Setting Pacing Pulse Width: 0.4 ms
Lead Channel Setting Sensing Sensitivity: 1.2 mV

## 2022-01-26 ENCOUNTER — Other Ambulatory Visit: Payer: Self-pay | Admitting: Family Medicine

## 2022-01-26 DIAGNOSIS — E039 Hypothyroidism, unspecified: Secondary | ICD-10-CM

## 2022-01-26 NOTE — Progress Notes (Signed)
Remote pacemaker transmission.   

## 2022-01-31 ENCOUNTER — Telehealth: Payer: Self-pay | Admitting: Urology

## 2022-01-31 NOTE — Telephone Encounter (Signed)
DOS - 02/24/22  AIKEN OSTEOTOMY LEFT --- 05397 LAPIDUS PROCEDURE INCLUDING BUNIONECTOMY LEFT -- 67341 HAMMERTOE REPAIR 2ND LEFT --- [id number]   [facility] EFFECTIVE DATE - 09/04/21   PLAN DEDUCTIBLE -  $0.00 OUT OF POCKET - $8,300.00 W/ $2,409.73 REMAINING COINSURANCE - 20% COPAY - $0.00   PER [employer] WEBSITE FOR CPT CODES 53299, [phone] AND 34196 HAVE BEEN APPROVED, AUTH # [id number], GOOD FROM 02/24/22 - 05/25/22

## 2022-02-03 NOTE — Progress Notes (Signed)
HPI: Tiffany Berg is a 66 y.o. female with hx of IFG,HLD, hypothyroidism, and symptomatic bradycardia s/p pacemaker placement here today for surgery clearance for her left foot surgery, requested by Dr.McDonald.  She was last seen on 10/31/21 for pre op, surgery was cancelled because she did not have cardiac clearance.  Planning on having left hallus valgus lapidus, aiken osteotomy, hammer toe correction of 2nd toe, and bone graft on 02/24/22.  She had similar procedure on her right foot, great improvement of pain.  No complications during or right after procedure and recovered well. She is not sure about type of anesthesia that will be used this time but most likely she is having IV sedation, monitor anesthesia care and regional as she had for her right foot surgery.  Hysterectomy about 3 to 4 years ago with general anesthesia, no complications.  No history of tobacco use, CKD, diabetes, or CAD.  Second-degree AV block status post pacemaker placement. Last evaluated by cardiologist on 12/08/21. She denies any CP, dyspnea, palpitation, dizziness, diaphoresis when caring heavy groceries, walking a hill, or walking upstairs briskly.  She is going to stay with one of her daughters while recovering from surgery.  Last visit she was complaining of lower extremity muscle aches and shoulder pain, I recommended to hold on statin medication and monitor for changes.  She was evaluated next day for back pain. Pain has resolved. She has not resumed statin. + Aortic atherosclerosis.  Lab Results  Component Value Date   CHOL 232 (H) 10/14/2020   HDL 52.30 10/14/2020   LDLCALC 160 (H) 10/14/2020   TRIG 101.0 10/14/2020   CHOLHDL 4 10/14/2020   Hypothyroidism: She is on levothyroxine75 mcg daily. Tolerating medication well, no side effects reported.She is taking medication as instructed. She has not noted dysphagia, changes in bowel habits, tremor, cold/heat intolerance, or abnormal weight  loss.  Lab Results  Component Value Date   TSH 9.07 (H) 09/21/2021   Review of Systems  Constitutional:  Negative for activity change, appetite change and fever.  HENT:  Negative for mouth sores, nosebleeds and sore throat.   Respiratory:  Negative for cough and wheezing.   Cardiovascular:  Negative for leg swelling.  Gastrointestinal:  Negative for abdominal pain, nausea and vomiting.  Genitourinary:  Negative for decreased urine volume, dysuria and hematuria.  Musculoskeletal:  Positive for arthralgias. Negative for gait problem.  Skin:  Negative for rash.  Neurological:  Negative for syncope, weakness and headaches.  Rest see pertinent positives and negatives per HPI.  Current Outpatient Medications on File Prior to Visit  Medication Sig Dispense Refill   B Complex Vitamins (VITAMIN B COMPLEX PO) Take by mouth.     levothyroxine (SYNTHROID) 75 MCG tablet TAKE 1 TABLET BY MOUTH ONCE DAILY BEFORE BREAKFAST 90 tablet 0   tiZANidine (ZANAFLEX) 2 MG tablet Take 1-2 tablets (2-4 mg total) by mouth every 6 (six) hours as needed for muscle spasms. 60 tablet 1   fluticasone (FLONASE) 50 MCG/ACT nasal spray Place 1 spray into both nostrils 2 (two) times daily. 16 g 0   lovastatin (MEVACOR) 40 MG tablet TAKE 2 TABLETS BY MOUTH AT BEDTIME 180 tablet 1   No current facility-administered medications on file prior to visit.   Past Medical History:  Diagnosis Date   Cardiac pacemaker in situ 03/14/2020   dual chamber medtronic   cardiologist--- dr Curt Bears---  ED 03-12-2020 presyncope, sob, some chest pain w/ exertion, found to have symptomatic bradycardia and second degree av  block type 2   Ductal carcinoma in situ (DCIS) of left breast    oncologist-- dr c. Faythe Ghee ([facility]);  dx 10/ 2014  DCIS left breast , ER/PR+;  07-22-2013 s/p  left breast partial mastectomy w/ sln dissection's and  08-12-2013 s/p left breast lumpecotmy (excision another site);  completed radiation 02/ 2015   Headache     Hyperlipidemia    Hypothyroidism    Insomnia    Mobitz type 2 second degree heart block 03/12/2020   w/ symptomatic bradycardia, rate 30s;  s/p  PPM 03-14-2020   Myalgia, multiple sites    Pre-diabetes    Symptomatic bradycardia 03/12/2020   s/p PPM   Wears glasses    Wears partial dentures    upper and lower   No Known Allergies  Social History   Socioeconomic History   Marital status: Legally Separated    Spouse name: Not on file   Number of children: Not on file   Years of education: Not on file   Highest education level: Not on file  Occupational History   Not on file  Tobacco Use   Smoking status: Never   Smokeless tobacco: Never  Vaping Use   Vaping Use: Never used  Substance and Sexual Activity   Alcohol use: Yes    Comment: socially   Drug use: Never   Sexual activity: Yes    Birth control/protection: Post-menopausal, Surgical  Other Topics Concern   Not on file  Social History Narrative   Not on file   Social Determinants of Health   Financial Resource Strain: Not on file  Food Insecurity: Not on file  Transportation Needs: Not on file  Physical Activity: Not on file  Stress: Not on file  Social Connections: Not on file   Vitals:   02/06/22 0948  BP: 128/80  Pulse: 72  Resp: 16  SpO2: 97%   Body mass index is 27.87 kg/m.  Physical Exam Vitals and nursing note reviewed.  Constitutional:      General: She is not in acute distress.    Appearance: She is well-developed.  HENT:     Head: Normocephalic and atraumatic.     Mouth/Throat:     Mouth: Mucous membranes are moist.     Pharynx: Oropharynx is clear.  Eyes:     Conjunctiva/sclera: Conjunctivae normal.  Cardiovascular:     Rate and Rhythm: Normal rate and regular rhythm.     Pulses:          Posterior tibial pulses are 2+ on the right side and 2+ on the left side.     Heart sounds: No murmur heard. Pulmonary:     Effort: Pulmonary effort is normal. No respiratory distress.      Breath sounds: Normal breath sounds.  Abdominal:     Palpations: Abdomen is soft. There is no hepatomegaly or mass.     Tenderness: There is no abdominal tenderness.  Lymphadenopathy:     Cervical: No cervical adenopathy.  Skin:    General: Skin is warm.     Findings: No erythema or rash.  Neurological:     General: No focal deficit present.     Mental Status: She is alert and oriented to person, place, and time.     Cranial Nerves: No cranial nerve deficit.     Gait: Gait normal.  Psychiatric:     Comments: Well groomed, good eye contact.   ASSESSMENT AND PLAN:  Tiffany Berg was seen today for surgery clearance.  Diagnoses and all orders for this visit: Orders Placed This Encounter  Procedures   Basic metabolic panel   TSH   Lab Results  Component Value Date   TSH 4.83 02/06/2022   Lab Results  Component Value Date   CREATININE 0.71 02/06/2022   BUN 17 02/06/2022   NA 140 02/06/2022   K 3.7 02/06/2022   CL 100 02/06/2022   CO2 31 02/06/2022   Pre-op exam Planning on having left foot surgery, similar procedure she had in her right foot with no complications. Anesthesia type: MAC and regional. Chronic medical problems are stable and adequately controlled. Recommend avoiding NSAIDs, including Aspirin; multivitamins, and OTC supplements at least 7 to 8 days before surgery. She can take her levothyroxine the day before surgery and resume the day after. For DVT prophylaxis: Early ambulation. She has been cleared by her cardiologist. We discussed some side effects of opioids, which most likely will be prescribed for pain management. Recommend MiraLAX at night daily when taking opioid medication to prevent constipation. We will plan on faxing copy of today's visit to Dr. Lynann Bologna. She is medically cleared for left foot surgery.  Hyperlipidemia Currently she is on nonpharmacologic treatment. Recommend trying to take lovastatin 40 mg daily again after recovery from foot  surgery. We discussed some statins CV benefits. We will plan on checking fasting lipid panel next visit.  Acquired hypothyroidism Continue levothyroxine 75 mcg daily. Further recommendation will be given according to TSH result.  I spent a total of 31 minutes in both face to face and non face to face activities for this visit on the date of this encounter. During this time history was obtained and documented, examination was performed, prior labs reviewed and assessment/plan discussed.  Return in about 4 months (around 06/08/2022) for CPE.  Tiffany Pheasant G. Martinique, MD  [facility]. [place] office.

## 2022-02-06 ENCOUNTER — Encounter: Payer: Self-pay | Admitting: Family Medicine

## 2022-02-06 ENCOUNTER — Ambulatory Visit (INDEPENDENT_AMBULATORY_CARE_PROVIDER_SITE_OTHER): Payer: Medicare Other | Admitting: Family Medicine

## 2022-02-06 VITALS — BP 128/80 | HR 72 | Resp 16 | Ht 62.0 in | Wt 152.4 lb

## 2022-02-06 DIAGNOSIS — E039 Hypothyroidism, unspecified: Secondary | ICD-10-CM | POA: Diagnosis not present

## 2022-02-06 DIAGNOSIS — Z01818 Encounter for other preprocedural examination: Secondary | ICD-10-CM

## 2022-02-06 DIAGNOSIS — E785 Hyperlipidemia, unspecified: Secondary | ICD-10-CM | POA: Diagnosis not present

## 2022-02-06 LAB — BASIC METABOLIC PANEL
BUN: 17 mg/dL (ref 6–23)
CO2: 31 mEq/L (ref 19–32)
Calcium: 9.7 mg/dL (ref 8.4–10.5)
Chloride: 100 mEq/L (ref 96–112)
Creatinine, Ser: 0.71 mg/dL (ref 0.40–1.20)
GFR: 87.47 mL/min (ref >60.00)
Glucose, Bld: 147 mg/dL — ABNORMAL HIGH (ref 70–99)
Potassium: 3.7 mEq/L (ref 3.5–5.1)
Sodium: 140 mEq/L (ref 135–145)

## 2022-02-06 LAB — TSH: TSH: 4.83 u[IU]/mL (ref 0.35–5.50)

## 2022-02-06 NOTE — Assessment & Plan Note (Signed)
Currently she is on nonpharmacologic treatment. Recommend trying to take lovastatin 40 mg daily again after recovery from foot surgery. We discussed some statins CV benefits. We will plan on checking fasting lipid panel next visit.

## 2022-02-06 NOTE — Patient Instructions (Addendum)
A few things to remember from today's visit:  Pre-op exam  Acquired hypothyroidism - Plan: Basic metabolic panel, TSH  If you need refills please call your pharmacy. Do not use My Chart to request refills or for acute issues that need immediate attention.   Mandaremos copia de la visita de hoy a su cirujano. No tome ibuprofen,naproxen (aleve),multivitaminas, o suplementos por al menos una semana antes de la [place]. Si tiene [employer]olicitor.  El medicamento de la tiroides lo toma el da antes and el da despus de la [place]. Empieza otra ves el medicamento para el colesterol despus de la [place].  Please be sure medication list is accurate. If a new problem present, please set up appointment sooner than planned today.

## 2022-02-06 NOTE — Assessment & Plan Note (Signed)
Continue levothyroxine 75 mcg daily. Further recommendation will be given according to TSH result. 

## 2022-02-10 ENCOUNTER — Telehealth: Payer: Self-pay | Admitting: *Deleted

## 2022-02-15 ENCOUNTER — Other Ambulatory Visit: Payer: Self-pay

## 2022-02-15 DIAGNOSIS — R739 Hyperglycemia, unspecified: Secondary | ICD-10-CM

## 2022-02-16 ENCOUNTER — Other Ambulatory Visit (INDEPENDENT_AMBULATORY_CARE_PROVIDER_SITE_OTHER): Payer: Medicare Other

## 2022-02-16 DIAGNOSIS — R739 Hyperglycemia, unspecified: Secondary | ICD-10-CM

## 2022-02-16 LAB — HEMOGLOBIN A1C: Hgb A1c MFr Bld: 6.4 % (ref 4.6–6.5)

## 2022-02-21 ENCOUNTER — Telehealth: Payer: Self-pay | Admitting: Family Medicine

## 2022-02-21 ENCOUNTER — Encounter: Payer: Self-pay | Admitting: Cardiology

## 2022-02-21 NOTE — Progress Notes (Signed)
PERIOPERATIVE PRESCRIPTION FOR IMPLANTED CARDIAC DEVICE PROGRAMMING  Patient Information: Name:  Tiffany Berg  DOB:  12/30/1955  MRN:  [id number]    Planned Procedure:  Left bunionectomy and hammer toe correction  Surgeon:  Dr Lanae Crumbly  Date of Procedure:  02-24-2022  Cautery will be used.  Position during surgery:     Please send documentation back to:  [facility] (Fax # [phone])   Device Information:  Clinic EP Physician:  Allegra Lai, MD   Device Type:  Pacemaker Manufacturer and Phone #:  Medtronic: [id number] Pacemaker Dependent?:  Yes.   Date of Last Device Check:  01/19/2022 Normal Device Function?:  Yes.    Electrophysiologist's Recommendations:  Have magnet available. Provide continuous ECG monitoring when magnet is used or reprogramming is to be performed.  Procedure should not interfere with device function.  No device programming or magnet placement needed.  Per Device Clinic Standing Orders, Wanda Plump, RN  3:08 PM 02/21/2022

## 2022-02-21 NOTE — Telephone Encounter (Signed)
Left message for patient to call back and schedule Medicare Annual Wellness Visit (AWV) either virtually or in office. Left  my jabber number [phone]   Last AWV ;12/03/20  please schedule at anytime with [facility] Nurse Health Advisor 1 or 2   

## 2022-02-22 ENCOUNTER — Other Ambulatory Visit: Payer: Self-pay

## 2022-02-22 ENCOUNTER — Ambulatory Visit (INDEPENDENT_AMBULATORY_CARE_PROVIDER_SITE_OTHER): Payer: Medicare Other

## 2022-02-22 ENCOUNTER — Encounter (HOSPITAL_BASED_OUTPATIENT_CLINIC_OR_DEPARTMENT_OTHER): Payer: Self-pay | Admitting: Podiatry

## 2022-02-22 VITALS — Ht 62.0 in | Wt 152.0 lb

## 2022-02-22 DIAGNOSIS — Z0000 Encounter for general adult medical examination without abnormal findings: Secondary | ICD-10-CM | POA: Diagnosis not present

## 2022-02-22 NOTE — Patient Instructions (Addendum)
[name] , Thank you for taking time to come for your Medicare Wellness Visit. I appreciate your ongoing commitment to your health goals. Please review the following plan we discussed and let me know if I can assist you in the future.   These are the goals we discussed:  Goals      DIET - INCREASE LEAN PROTEINS     Low cholesterol with more fruits vegtables     No current goals        This is a list of the screening recommended for you and due dates:  Health Maintenance  Topic Date Due   COVID-19 Vaccine (1) 03/10/2022*   Zoster (Shingles) Vaccine (1 of 2) 05/25/2022*   Pneumonia Vaccine (2 - PCV) 02/23/2023*   Mammogram  02/23/2023*   Colon Cancer Screening  02/23/2023*   Tetanus Vaccine  02/23/2023*   Flu Shot  04/04/2022   DEXA scan (bone density measurement)  Completed   Hepatitis C Screening: USPSTF Recommendation to screen - Ages 61-79 yo.  Completed   HPV Vaccine  Aged Out  *Topic was postponed. The date shown is not the original due date.    Advanced directives: No  Conditions/risks identified: None  Next appointment: Follow up in one year for your annual wellness visit     Preventive Care 68 Years and Older, Female Preventive care refers to lifestyle choices and visits with your health care provider that can promote health and wellness. What does preventive care include? A yearly physical exam. This is also called an annual well check. Dental exams once or twice a year. Routine eye exams. Ask your health care provider how often you should have your eyes checked. Personal lifestyle choices, including: Daily care of your teeth and gums. Regular physical activity. Eating a healthy diet. Avoiding tobacco and drug use. Limiting alcohol use. Practicing safe sex. Taking low-dose aspirin every day. Taking vitamin and mineral supplements as recommended by your health care provider. What happens during an annual well check? The services and screenings done by  your health care provider during your annual well check will depend on your age, overall health, lifestyle risk factors, and family history of disease. Counseling  Your health care provider may ask you questions about your: Alcohol use. Tobacco use. Drug use. Emotional well-being. Home and relationship well-being. Sexual activity. Eating habits. History of falls. Memory and ability to understand (cognition). Work and work Statistician. Reproductive health. Screening  You may have the following tests or measurements: Height, weight, and BMI. Blood pressure. Lipid and cholesterol levels. These may be checked every 5 years, or more frequently if you are over 68 years old. Skin check. Lung cancer screening. You may have this screening every year starting at age 68 if you have a 30-pack-year history of smoking and currently smoke or have quit within the past 15 years. Fecal occult blood test (FOBT) of the stool. You may have this test every year starting at age 68. Flexible sigmoidoscopy or colonoscopy. You may have a sigmoidoscopy every 5 years or a colonoscopy every 10 years starting at age 68. Hepatitis C blood test. Hepatitis B blood test. Sexually transmitted disease (STD) testing. Diabetes screening. This is done by checking your blood sugar (glucose) after you have not eaten for a while (fasting). You may have this done every 1-3 years. Bone density scan. This is done to screen for osteoporosis. You may have this done starting at age 68. Mammogram. This may be done every 1-2 years. Talk  to your health care provider about how often you should have regular mammograms. Talk with your health care provider about your test results, treatment options, and if necessary, the need for more tests. Vaccines  Your health care provider may recommend certain vaccines, such as: Influenza vaccine. This is recommended every year. Tetanus, diphtheria, and acellular pertussis (Tdap, Td) vaccine. You  may need a Td booster every 10 years. Zoster vaccine. You may need this after age 35. Pneumococcal 13-valent conjugate (PCV13) vaccine. One dose is recommended after age 18. Pneumococcal polysaccharide (PPSV23) vaccine. One dose is recommended after age 71. Talk to your health care provider about which screenings and vaccines you need and how often you need them. This information is not intended to replace advice given to you by your health care provider. Make sure you discuss any questions you have with your health care provider. Document Released: 09/17/2015 Document Revised: 05/10/2016 Document Reviewed: 06/22/2015 Elsevier Interactive Patient Education  2017 [employer] Prevention in the Home Falls can cause injuries. They can happen to people of all ages. There are many things you can do to make your home safe and to help prevent falls. What can I do on the outside of my home? Regularly fix the edges of walkways and driveways and fix any cracks. Remove anything that might make you trip as you walk through a door, such as a raised step or threshold. Trim any bushes or trees on the path to your home. Use bright outdoor lighting. Clear any walking paths of anything that might make someone trip, such as rocks or tools. Regularly check to see if handrails are loose or broken. Make sure that both sides of any steps have handrails. Any raised decks and porches should have guardrails on the edges. Have any leaves, snow, or ice cleared regularly. Use sand or salt on walking paths during winter. Clean up any spills in your garage right away. This includes oil or grease spills. What can I do in the bathroom? Use night lights. Install grab bars by the toilet and in the tub and shower. Do not use towel bars as grab bars. Use non-skid mats or decals in the tub or shower. If you need to sit down in the shower, use a plastic, non-slip stool. Keep the floor dry. Clean up any water that spills  on the floor as soon as it happens. Remove soap buildup in the tub or shower regularly. Attach bath mats securely with double-sided non-slip rug tape. Do not have throw rugs and other things on the floor that can make you trip. What can I do in the bedroom? Use night lights. Make sure that you have a light by your bed that is easy to reach. Do not use any sheets or blankets that are too big for your bed. They should not hang down onto the floor. Have a firm chair that has side arms. You can use this for support while you get dressed. Do not have throw rugs and other things on the floor that can make you trip. What can I do in the kitchen? Clean up any spills right away. Avoid walking on wet floors. Keep items that you use a lot in easy-to-reach places. If you need to reach something above you, use a strong step stool that has a grab bar. Keep electrical cords out of the way. Do not use floor polish or wax that makes floors slippery. If you must use wax, use non-skid floor wax. Do  not have throw rugs and other things on the floor that can make you trip. What can I do with my stairs? Do not leave any items on the stairs. Make sure that there are handrails on both sides of the stairs and use them. Fix handrails that are broken or loose. Make sure that handrails are as long as the stairways. Check any carpeting to make sure that it is firmly attached to the stairs. Fix any carpet that is loose or worn. Avoid having throw rugs at the top or bottom of the stairs. If you do have throw rugs, attach them to the floor with carpet tape. Make sure that you have a light switch at the top of the stairs and the bottom of the stairs. If you do not have them, ask someone to add them for you. What else can I do to help prevent falls? Wear shoes that: Do not have high heels. Have rubber bottoms. Are comfortable and fit you well. Are closed at the toe. Do not wear sandals. If you use a stepladder: Make  sure that it is fully opened. Do not climb a closed stepladder. Make sure that both sides of the stepladder are locked into place. Ask someone to hold it for you, if possible. Clearly mark and make sure that you can see: Any grab bars or handrails. First and last steps. Where the edge of each step is. Use tools that help you move around (mobility aids) if they are needed. These include: Canes. Walkers. Scooters. Crutches. Turn on the lights when you go into a dark area. Replace any light bulbs as soon as they burn out. Set up your furniture so you have a clear path. Avoid moving your furniture around. If any of your floors are uneven, fix them. If there are any pets around you, be aware of where they are. Review your medicines with your doctor. Some medicines can make you feel dizzy. This can increase your chance of falling. Ask your doctor what other things that you can do to help prevent falls. This information is not intended to replace advice given to you by your health care provider. Make sure you discuss any questions you have with your health care provider. Document Released: 06/17/2009 Document Revised: 01/27/2016 Document Reviewed: 09/25/2014 Elsevier Interactive Patient Education  2017 [employer].

## 2022-02-22 NOTE — Progress Notes (Cosign Needed)
Subjective:   Tiffany Berg is a 66 y.o. female who presents for Medicare Annual (Subsequent) preventive examination.  Review of Systems    Virtual Visit via Telephone Note  I connected with  [name] on 02/22/22 at  2:30 PM EDT by telephone and verified that I am speaking with the correct person using two identifiers.  Location: Patient: Home Provider: Office Persons participating in the virtual visit: patient/Nurse Health Advisor   I discussed the limitations, risks, security and privacy concerns of performing an evaluation and management service by telephone and the availability of in person appointments. The patient expressed understanding and agreed to proceed.  Interactive audio and video telecommunications were attempted between this nurse and patient, however failed, due to patient having technical difficulties OR patient did not have access to video capability.  We continued and completed visit with audio only.  Some vital signs may be absent or patient reported.   Criselda Peaches, LPN  Cardiac Risk Factors include: advanced age (>60mn, >>65women)     Objective:    Today's Vitals   02/22/22 1449  Weight: 152 lb (68.9 kg)  Height: '5\' 2"'$  (1.575 m)   Body mass index is 27.8 kg/m.     02/22/2022    3:10 PM 10/15/2020    9:08 AM 03/12/2020    8:32 PM 11/14/2019    9:33 AM 02/19/2015    3:00 AM 02/17/2015   10:18 AM  Advanced Directives  Does Patient Have a Medical Advance Directive? No No No No No No  Would patient like information on creating a medical advance directive? No - Patient declined Yes (MAU/Ambulatory/Procedural Areas - Information given) No - Patient declined Yes (Inpatient - patient defers creating a medical advance directive at this time - Information given) Yes - Educational materials given Yes - Educational materials given    Current Medications (verified) Outpatient Encounter Medications as of 02/22/2022  Medication Sig   B Complex  Vitamins (VITAMIN B COMPLEX PO) Take by mouth.   fluticasone (FLONASE) 50 MCG/ACT nasal spray Place 1 spray into both nostrils 2 (two) times daily.   levothyroxine (SYNTHROID) 75 MCG tablet TAKE 1 TABLET BY MOUTH ONCE DAILY BEFORE BREAKFAST   lovastatin (MEVACOR) 40 MG tablet TAKE 2 TABLETS BY MOUTH AT BEDTIME (Patient taking differently: Take 80 mg by mouth at bedtime.)   tiZANidine (ZANAFLEX) 2 MG tablet Take 1-2 tablets (2-4 mg total) by mouth every 6 (six) hours as needed for muscle spasms.   No facility-administered encounter medications on file as of 02/22/2022.    Allergies (verified) Patient has no known allergies.   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   w/ symptomatic bradycardia, rate 30s;  s/p  PPM 03-14-2020   Myalgia, multiple sites    Pre-diabetes    Symptomatic  bradycardia 03/12/2020   s/p PPM   Wears glasses    Wears partial dentures    upper and lower   Past Surgical History:  Procedure Laterality Date   Barbie Banner OSTEOTOMY Right 10/15/2020   Procedure: Barbie Banner OSTEOTOMY;  Surgeon: Criselda Peaches, DPM;  Location: [place];  Service: Podiatry;  Laterality: Right;  REGIONAL BLOCK   ANTERIOR AND POSTERIOR REPAIR N/A 02/18/2015   Procedure: ANTERIOR (CYSTOCELE) AND POSTERIOR REPAIR (RECTOCELE);  Surgeon: Everett Graff, MD;  Location: [facility];  Service: Gynecology;  Laterality: N/A;   BLADDER SUSPENSION N/A 02/18/2015   Procedure: TRANSVAGINAL TAPE (TVT) PROCEDURE;  Surgeon: Everett Graff, MD;  Location: [facility];  Service: Gynecology;  Laterality: N/A;   CYSTOSCOPY Bilateral 02/18/2015   Procedure: CYSTOSCOPY;  Surgeon: Everett Graff, MD;  Location: [facility];  Service: Gynecology;  Laterality: Bilateral;   HALLUX VALGUS LAPIDUS Right 10/15/2020   Procedure: HALLUX VALGUS LAPIDUS AND BONE GRAFT;  Surgeon: Criselda Peaches, DPM;  Location: [place];  Service: Podiatry;  Laterality: Right;  WITH IV SEDATION; REGIONAL BLOCK   HAMMER TOE SURGERY Right 10/15/2020   Procedure: HAMMER TOE CORRECTION;  Surgeon: Criselda Peaches, DPM;  Location: [place];  Service: Podiatry;  Laterality: Right;  REGIONAL BLOCK   LAPAROSCOPIC ASSISTED VAGINAL HYSTERECTOMY N/A 02/18/2015   Procedure: LAPAROSCOPIC ASSISTED VAGINAL HYSTERECTOMY;  Surgeon: Everett Graff, MD;  Location: [facility];  Service: Gynecology;  Laterality: N/A;   MASTECTOMY, PARTIAL Left 08-12-2013  '@[facility]'$    excision another site   PACEMAKER IMPLANT N/A 03/14/2020   Procedure: PACEMAKER IMPLANT;  Surgeon: Constance Haw, MD;  Location: [facility];  Service: Cardiovascular;  Laterality: N/A;   RADIOACTIVE SEED GUIDED PARTIAL MASTECTOMY/AXILLARY SENTINEL NODE BIOPSY/AXILLARY NODE DISSECTION Left 07-22-2013  '@[facility]'$    SALPINGOOPHORECTOMY Bilateral 02/18/2015   Procedure: BILATERAL SALPINGO OOPHORECTOMY;  Surgeon: Everett Graff, MD;  Location: [facility];  Service: Gynecology;  Laterality: Bilateral;   Family History  Problem Relation Age of Onset   Heart disease Mother    Cancer Father        prostata   Cancer Sister 73       Breast   Social History   Socioeconomic History   Marital status: Legally Separated    Spouse name: Not on file   Number of children: Not on file   Years of education: Not on file   Highest  education level: Not on file  Occupational History   Not on file  Tobacco Use   Smoking status: Never   Smokeless tobacco: Never  Vaping Use   Vaping Use: Never used  Substance and Sexual Activity   Alcohol use: Yes    Comment: socially   Drug use: Never   Sexual activity: Yes    Birth control/protection: Post-menopausal, Surgical  Other Topics Concern   Not on file  Social History Narrative   Not on file   Social Determinants of Health   Financial Resource Strain: Low Risk  (02/22/2022)   Overall Financial Resource Strain (CARDIA)    Difficulty of Paying Living Expenses: Not hard at all  Food Insecurity: No Food Insecurity (02/22/2022)   Hunger Vital Sign    Worried About Running Out of Food in the Last Year: Never true    [employer] in the Last Year: Never true  Transportation Needs: No Transportation Needs (02/22/2022)   PRAPARE - Hydrologist (Medical): No  Lack of Transportation (Non-Medical): No  Physical Activity: Insufficiently Active (02/22/2022)   Exercise Vital Sign    Days of Exercise per Week: 2 days    Minutes of Exercise per Session: 60 min  Stress: No Stress Concern Present (02/22/2022)   [place]uth    Feeling of Stress : Not at all  Social Connections: Unknown (02/22/2022)   Social Connection and Isolation Panel [NHANES]    Frequency of Communication with Friends and Family: More than three times a week    Frequency of Social Gatherings with Friends and Family: More than three times a week    Attends Religious Services: Not on Advertising copywriter or Organizations: Yes    Attends Music therapist: More than 4 times per year    Marital Status: Separated    Tobacco Counseling Counseling given: Not Answered   Clinical Intake:  Pre-visit preparation completed: No  Pain : No/denies pain  Diabetic?  No   Activities of Daily  Living    02/22/2022    3:07 PM  In your present state of health, do you have any difficulty performing the following activities:  Hearing? 0  Vision? 0  Difficulty concentrating or making decisions? 0  Walking or climbing stairs? 0  Dressing or bathing? 0  Doing errands, shopping? 0  Preparing Food and eating ? N  Using the Toilet? N  In the past six months, have you accidently leaked urine? N  Do you have problems with loss of bowel control? N  Managing your Medications? N  Managing your Finances? N  Housekeeping or managing your Housekeeping? N    Patient Care Team: [place], Betty G, MD as PCP - General (Family Medicine) Jerline Pain, MD as PCP - Cardiology (Cardiology) Constance Haw, MD as PCP - Electrophysiology (Cardiology)  Indicate any recent Medical Services you may have received from other than [facility] providers in the past year (date may be approximate).     Assessment:   This is a routine wellness examination for Tiffany Berg.  Hearing/Vision screen Hearing Screening - Comments:: No hearing difficulty Vision Screening - Comments:: Wears glasses.  Dietary issues and exercise activities discussed: Exercise limited by: None identified   Goals Addressed             This Visit's Progress    No current goals         Depression Screen    02/22/2022    3:04 PM 02/06/2022    9:57 AM 10/31/2021    4:02 PM 12/03/2020    2:39 PM 09/02/2020    9:32 PM 08/20/2019   10:46 AM  PHQ 2/9 Scores  PHQ - 2 Score 0 0 2 0 0 0  PHQ- 9 Score   7       Fall Risk    02/22/2022    3:08 PM 02/06/2022    9:56 AM 12/03/2020    2:37 PM 09/29/2020    9:28 AM 08/20/2019   10:46 AM  Fall Risk   Falls in the past year? 0 0 0 0 1  Number falls in past yr: 0 0 0  0  Injury with Fall? 0 0 0  0  Risk for fall due to : No Fall Risks No Fall Risks   History of fall(s)  Follow up  Falls evaluation completed Falls evaluation completed  Education provided    [place]  HOME:  Any stairs in or around the home? No  If so, are there any without handrails? No  Home free of loose throw rugs in walkways, pet beds, electrical cords, etc? Yes  Adequate lighting in your home to reduce risk of falls? Yes   ASSISTIVE DEVICES UTILIZED TO PREVENT FALLS:  Life alert? Yes Use of a cane, walker or w/c? No  Grab bars in the bathroom? Yes  Shower chair or bench in shower? Yes  Elevated toilet seat or a handicapped toilet? Yes  TIMED UP AND GO:  Was the test performed? No . Audio Visit  Cognitive Function:      Immunizations Immunization History  Administered Date(s) Administered   Fluad Quad(high Dose 65+) 08/20/2019   Influenza, Seasonal, Injecte, Preservative Fre 07/16/2012   Influenza-Unspecified 06/17/2013   Pneumococcal Polysaccharide-23 08/20/2019    TDAP status: Due, Education has been provided regarding the importance of this vaccine. Advised may receive this vaccine at local pharmacy or Health Dept. Aware to provide a copy of the vaccination record if obtained from local pharmacy or Health Dept. Verbalized acceptance and understanding.  Flu Vaccine status: Declined, Education has been provided regarding the importance of this vaccine but patient still declined. Advised may receive this vaccine at local pharmacy or Health Dept. Aware to provide a copy of the vaccination record if obtained from local pharmacy or Health Dept. Verbalized acceptance and understanding.  Pneumococcal vaccine status: Declined,  Education has been provided regarding the importance of this vaccine but patient still declined. Advised may receive this vaccine at local pharmacy or Health Dept. Aware to provide a copy of the vaccination record if obtained from local pharmacy or Health Dept. Verbalized acceptance and understanding.   Covid-19 vaccine status: Completed vaccines  Qualifies for Shingles Vaccine? Yes   Zostavax completed No   Shingrix Completed?:  No.    Education has been provided regarding the importance of this vaccine. Patient has been advised to call insurance company to determine out of pocket expense if they have not yet received this vaccine. Advised may also receive vaccine at local pharmacy or Health Dept. Verbalized acceptance and understanding.  Screening Tests Health Maintenance  Topic Date Due   COVID-19 Vaccine (1) 03/10/2022 (Originally 05/11/1956)   Zoster Vaccines- Shingrix (1 of 2) 05/25/2022 (Originally 11/08/1974)   Pneumonia Vaccine 70+ Years old (2 - PCV) 02/23/2023 (Originally 08/19/2020)   MAMMOGRAM  02/23/2023 (Originally 03/02/2021)   COLONOSCOPY (Pts 45-13yr Insurance coverage will need to be confirmed)  02/23/2023 (Originally 11/09/1998)   TETANUS/TDAP  02/23/2023 (Originally 11/08/1974)   INFLUENZA VACCINE  04/04/2022   DEXA SCAN  Completed   Hepatitis C Screening  Completed   HPV VACCINES  Aged Out    Health Maintenance  There are no preventive care reminders to display for this patient.   Colorectal cancer screening: Referral to GI placed Patient deferred. Pt aware the office will call re: appt.  Mammogram status: Ordered Patient deferred. Pt provided with contact info and advised to call to schedule appt.   Bone Density status: Completed 11/18/19. Results reflect: Bone density results: OSTEOPOROSIS. Repeat every 2 years.  Lung Cancer Screening: (Low Dose CT Chest recommended if Age 68-80years, 30 pack-year currently smoking OR have quit w/in 15years.) does not qualify.     Additional Screening:  Hepatitis C Screening: does qualify; Completed 08/20/19  Vision Screening: Recommended annual ophthalmology exams for early detection of glaucoma and other disorders of the eye. Is the patient up to date with  their annual eye exam?  Yes  Who is the provider or what is the name of the office in which the patient attends annual eye exams? Patient unsure If pt is not established with a provider, would they  like to be referred to a provider to establish care? No .   Dental Screening: Recommended annual dental exams for proper oral hygiene  Community Resource Referral / Chronic Care Management:   CRR required this visit?  No   CCM required this visit?  No      Plan:     I have personally reviewed and noted the following in the patient's chart:   Medical and social history Use of alcohol, tobacco or illicit drugs  Current medications and supplements including opioid prescriptions.  Functional ability and status Nutritional status Physical activity Advanced directives List of other physicians Hospitalizations, surgeries, and ER visits in previous 12 months Vitals Screenings to include cognitive, depression, and falls Referrals and appointments  In addition, I have reviewed and discussed with patient certain preventive protocols, quality metrics, and best practice recommendations. A written personalized care plan for preventive services as well as general preventive health recommendations were provided to patient.     Criselda Peaches, LPN   [id number]   Nurse Notes: None

## 2022-02-22 NOTE — Progress Notes (Signed)
Spoke w/ via phone for pre-op interview--- Tiffany Berg and pacific interpreter Tiffany Berg [phone] Lab needs dos----   ISTAT             Lab results------ current EKG in [place] test -----patient states asymptomatic no test needed Arrive at -------0530 NPO after MN NO Solid Food.   Med rec completed Medications to take morning of surgery -----Synthroid Diabetic medication ----- Patient instructed no nail polish to be worn day of surgery Patient instructed to bring photo id and insurance card day of surgery Patient aware to have Driver (ride ) / caregiver Daughter Tiffany Berg    for 24 hours after surgery  Patient Special Instructions ----- Pre-Op special Istructions ----- Patient verbalized understanding of instructions that were given at this phone interview. Patient denies shortness of breath, chest pain, fever, cough at this phone interview.   **Device orders in Epic dated 02/21/22 Dr Shonna Chock cardiac clearance in [facility]

## 2022-02-24 ENCOUNTER — Encounter (HOSPITAL_BASED_OUTPATIENT_CLINIC_OR_DEPARTMENT_OTHER): Payer: Self-pay | Admitting: Podiatry

## 2022-02-24 ENCOUNTER — Ambulatory Visit (HOSPITAL_BASED_OUTPATIENT_CLINIC_OR_DEPARTMENT_OTHER): Payer: Medicare Other

## 2022-02-24 ENCOUNTER — Encounter (HOSPITAL_BASED_OUTPATIENT_CLINIC_OR_DEPARTMENT_OTHER)
Admission: RE | Disposition: A | Discharge status: Home / Self Care | Payer: Self-pay | Source: Ambulatory Visit | Attending: Podiatry

## 2022-02-24 ENCOUNTER — Other Ambulatory Visit: Payer: Self-pay

## 2022-02-24 ENCOUNTER — Ambulatory Visit (HOSPITAL_BASED_OUTPATIENT_CLINIC_OR_DEPARTMENT_OTHER): Payer: Medicare Other | Admitting: Anesthesiology

## 2022-02-24 ENCOUNTER — Ambulatory Visit (HOSPITAL_BASED_OUTPATIENT_CLINIC_OR_DEPARTMENT_OTHER)
Admission: RE | Admit: 2022-02-24 | Discharge: 2022-02-24 | Disposition: A | Discharge status: Home / Self Care | Payer: Medicare Other | Source: Ambulatory Visit | Attending: Podiatry | Admitting: Podiatry

## 2022-02-24 DIAGNOSIS — M205X2 Other deformities of toe(s) (acquired), left foot: Secondary | ICD-10-CM | POA: Insufficient documentation

## 2022-02-24 DIAGNOSIS — M2012 Hallux valgus (acquired), left foot: Secondary | ICD-10-CM

## 2022-02-24 DIAGNOSIS — E039 Hypothyroidism, unspecified: Secondary | ICD-10-CM | POA: Insufficient documentation

## 2022-02-24 DIAGNOSIS — M2042 Other hammer toe(s) (acquired), left foot: Secondary | ICD-10-CM | POA: Diagnosis not present

## 2022-02-24 DIAGNOSIS — Z79890 Hormone replacement therapy (postmenopausal): Secondary | ICD-10-CM | POA: Diagnosis not present

## 2022-02-24 DIAGNOSIS — Z01818 Encounter for other preprocedural examination: Secondary | ICD-10-CM

## 2022-02-24 DIAGNOSIS — E785 Hyperlipidemia, unspecified: Secondary | ICD-10-CM | POA: Insufficient documentation

## 2022-02-24 DIAGNOSIS — M21612 Bunion of left foot: Secondary | ICD-10-CM

## 2022-02-24 DIAGNOSIS — M8970 Major osseous defect, unspecified site: Secondary | ICD-10-CM | POA: Diagnosis not present

## 2022-02-24 DIAGNOSIS — G8918 Other acute postprocedural pain: Secondary | ICD-10-CM | POA: Diagnosis not present

## 2022-02-24 DIAGNOSIS — M89772 Major osseous defect, left ankle and foot: Secondary | ICD-10-CM | POA: Insufficient documentation

## 2022-02-24 DIAGNOSIS — Z950 Presence of cardiac pacemaker: Secondary | ICD-10-CM | POA: Insufficient documentation

## 2022-02-24 HISTORY — PX: HAMMER TOE SURGERY: SHX385

## 2022-02-24 HISTORY — PX: AIKEN OSTEOTOMY: SHX6331

## 2022-02-24 HISTORY — PX: HALLUX VALGUS LAPIDUS: SHX6626

## 2022-02-24 HISTORY — PX: GRAFT APPLICATION: SHX6696

## 2022-02-24 LAB — POCT I-STAT, CHEM 8
BUN: 15 mg/dL (ref 8–23)
Calcium, Ion: 1.2 mmol/L (ref 1.15–1.40)
Chloride: 102 mmol/L (ref 98–111)
Creatinine, Ser: 0.6 mg/dL (ref 0.44–1.00)
Glucose, Bld: 129 mg/dL — ABNORMAL HIGH (ref 70–99)
HCT: 42 % (ref 36.0–46.0)
Hemoglobin: 14.3 g/dL (ref 12.0–15.0)
Potassium: 3.3 mmol/L — ABNORMAL LOW (ref 3.5–5.1)
Sodium: 143 mmol/L (ref 135–145)
TCO2: 28 mmol/L (ref 22–32)

## 2022-02-24 SURGERY — BUNIONECTOMY, LAPIDUS
Anesthesia: General | Site: Toe | Laterality: Left

## 2022-02-24 MED ORDER — FENTANYL CITRATE (PF) 100 MCG/2ML IJ SOLN
INTRAMUSCULAR | Status: AC
  Filled 2022-02-24: qty 2

## 2022-02-24 MED ORDER — CEFAZOLIN SODIUM-DEXTROSE 2-4 GM/100ML-% IV SOLN
2.0000 g | INTRAVENOUS | Status: AC
  Administered 2022-02-24: 2 g via INTRAVENOUS

## 2022-02-24 MED ORDER — GABAPENTIN 300 MG PO CAPS: 300.0000 mg | ORAL_CAPSULE | Freq: Three times a day (TID) | ORAL | 0 refills | Status: DC

## 2022-02-24 MED ORDER — LIDOCAINE 2% (20 MG/ML) 5 ML SYRINGE
INTRAMUSCULAR | Status: DC | PRN
  Administered 2022-02-24: 100 mg via INTRAVENOUS

## 2022-02-24 MED ORDER — MIDAZOLAM HCL 2 MG/2ML IJ SOLN
1.0000 mg | Freq: Once | INTRAMUSCULAR | Status: AC
Start: 2022-02-24 — End: 2022-02-24
  Administered 2022-02-24: 1 mg via INTRAVENOUS

## 2022-02-24 MED ORDER — EPHEDRINE SULFATE (PRESSORS) 50 MG/ML IJ SOLN
INTRAMUSCULAR | Status: DC | PRN
  Administered 2022-02-24 (×3): 5 mg via INTRAVENOUS
  Administered 2022-02-24: 10 mg via INTRAVENOUS

## 2022-02-24 MED ORDER — ACETAMINOPHEN 500 MG PO TABS: 1000.0000 mg | ORAL_TABLET | Freq: Four times a day (QID) | ORAL | 0 refills | Status: DC | PRN

## 2022-02-24 MED ORDER — VANCOMYCIN HCL 1000 MG IV SOLR
INTRAVENOUS | Status: AC
  Filled 2022-02-24: qty 20

## 2022-02-24 MED ORDER — OXYCODONE HCL 5 MG/5ML PO SOLN: 5.0000 mg | Freq: Once | ORAL | Status: DC | PRN

## 2022-02-24 MED ORDER — DEXAMETHASONE SODIUM PHOSPHATE 10 MG/ML IJ SOLN
INTRAMUSCULAR | Status: DC | PRN
  Administered 2022-02-24: 10 mg

## 2022-02-24 MED ORDER — MIDAZOLAM HCL 2 MG/2ML IJ SOLN
INTRAMUSCULAR | Status: AC
  Filled 2022-02-24: qty 2

## 2022-02-24 MED ORDER — FENTANYL CITRATE (PF) 100 MCG/2ML IJ SOLN: 25.0000 ug | INTRAMUSCULAR | Status: DC | PRN

## 2022-02-24 MED ORDER — DEXAMETHASONE SODIUM PHOSPHATE 4 MG/ML IJ SOLN
INTRAMUSCULAR | Status: DC | PRN
  Administered 2022-02-24: 10 mg via INTRAVENOUS

## 2022-02-24 MED ORDER — BUPIVACAINE HCL (PF) 0.5 % IJ SOLN
INTRAMUSCULAR | Status: AC
  Filled 2022-02-24: qty 30

## 2022-02-24 MED ORDER — PHENYLEPHRINE HCL (PRESSORS) 10 MG/ML IV SOLN
INTRAVENOUS | Status: DC | PRN
  Administered 2022-02-24 (×2): 80 ug via INTRAVENOUS

## 2022-02-24 MED ORDER — PROPOFOL 10 MG/ML IV BOLUS
INTRAVENOUS | Status: DC | PRN
  Administered 2022-02-24: 160 mg via INTRAVENOUS
  Administered 2022-02-24: 40 mg via INTRAVENOUS

## 2022-02-24 MED ORDER — EPHEDRINE 5 MG/ML INJ
INTRAVENOUS | Status: AC
  Filled 2022-02-24: qty 5

## 2022-02-24 MED ORDER — PROPOFOL 10 MG/ML IV BOLUS
INTRAVENOUS | Status: AC
  Filled 2022-02-24: qty 20

## 2022-02-24 MED ORDER — AMISULPRIDE (ANTIEMETIC) 5 MG/2ML IV SOLN
10.0000 mg | Freq: Once | INTRAVENOUS | Status: AC
Start: 2022-02-24 — End: 2022-02-24
  Administered 2022-02-24: 10 mg via INTRAVENOUS

## 2022-02-24 MED ORDER — CEFAZOLIN SODIUM-DEXTROSE 2-4 GM/100ML-% IV SOLN
INTRAVENOUS | Status: AC
  Filled 2022-02-24: qty 100

## 2022-02-24 MED ORDER — ONDANSETRON HCL 4 MG/2ML IJ SOLN
INTRAMUSCULAR | Status: AC
  Filled 2022-02-24: qty 2

## 2022-02-24 MED ORDER — IBUPROFEN 600 MG PO TABS: 600.0000 mg | ORAL_TABLET | Freq: Three times a day (TID) | ORAL | 0 refills | Status: AC | PRN

## 2022-02-24 MED ORDER — BUPIVACAINE HCL (PF) 0.5 % IJ SOLN
INTRAMUSCULAR | Status: DC | PRN
  Administered 2022-02-24: 10 mL

## 2022-02-24 MED ORDER — ONDANSETRON HCL 4 MG/2ML IJ SOLN
INTRAMUSCULAR | Status: DC | PRN
  Administered 2022-02-24: 4 mg via INTRAVENOUS

## 2022-02-24 MED ORDER — LIDOCAINE HCL 2 % IJ SOLN
INTRAMUSCULAR | Status: AC
  Filled 2022-02-24: qty 20

## 2022-02-24 MED ORDER — OXYCODONE HCL 5 MG PO TABS: 5.0000 mg | ORAL_TABLET | Freq: Once | ORAL | Status: DC | PRN

## 2022-02-24 MED ORDER — MIDAZOLAM HCL 5 MG/5ML IJ SOLN
INTRAMUSCULAR | Status: DC | PRN
  Administered 2022-02-24: 2 mg via INTRAVENOUS

## 2022-02-24 MED ORDER — ACETAMINOPHEN 325 MG PO TABS: 325.0000 mg | ORAL_TABLET | ORAL | Status: DC | PRN

## 2022-02-24 MED ORDER — LACTATED RINGERS IV SOLN: INTRAVENOUS | Status: DC

## 2022-02-24 MED ORDER — ASPIRIN 325 MG PO TBEC: 325.0000 mg | DELAYED_RELEASE_TABLET | Freq: Two times a day (BID) | ORAL | 0 refills | Status: AC

## 2022-02-24 MED ORDER — ACETAMINOPHEN 160 MG/5ML PO SOLN: 325.0000 mg | ORAL | Status: DC | PRN

## 2022-02-24 MED ORDER — ONDANSETRON HCL 4 MG/2ML IJ SOLN: 4.0000 mg | Freq: Once | INTRAMUSCULAR | Status: DC | PRN

## 2022-02-24 MED ORDER — FENTANYL CITRATE (PF) 100 MCG/2ML IJ SOLN
INTRAMUSCULAR | Status: DC | PRN
  Administered 2022-02-24 (×2): 25 ug via INTRAVENOUS
  Administered 2022-02-24: 50 ug via INTRAVENOUS
  Administered 2022-02-24: 25 ug via INTRAVENOUS
  Administered 2022-02-24: 50 ug via INTRAVENOUS
  Administered 2022-02-24: 100 ug via INTRAVENOUS
  Administered 2022-02-24: 25 ug via INTRAVENOUS

## 2022-02-24 MED ORDER — AMISULPRIDE (ANTIEMETIC) 5 MG/2ML IV SOLN
INTRAVENOUS | Status: AC
  Filled 2022-02-24: qty 4

## 2022-02-24 MED ORDER — KETOROLAC TROMETHAMINE 15 MG/ML IJ SOLN: 15.0000 mg | Freq: Once | INTRAMUSCULAR | Status: DC

## 2022-02-24 MED ORDER — PHENYLEPHRINE 80 MCG/ML (10ML) SYRINGE FOR IV PUSH (FOR BLOOD PRESSURE SUPPORT)
PREFILLED_SYRINGE | INTRAVENOUS | Status: AC
  Filled 2022-02-24: qty 10

## 2022-02-24 MED ORDER — OXYCODONE HCL 5 MG PO TABS: 5.0000 mg | ORAL_TABLET | ORAL | 0 refills | Status: AC | PRN

## 2022-02-24 MED ORDER — SODIUM CHLORIDE 0.9 % IR SOLN
Status: DC | PRN
  Administered 2022-02-24: 250 mL

## 2022-02-24 MED ORDER — MEPERIDINE HCL 25 MG/ML IJ SOLN: 6.2500 mg | INTRAMUSCULAR | Status: DC | PRN

## 2022-02-24 MED ORDER — 0.9 % SODIUM CHLORIDE (POUR BTL) OPTIME
TOPICAL | Status: DC | PRN
  Administered 2022-02-24: 200 mL

## 2022-02-24 MED ORDER — LIDOCAINE HCL (PF) 2 % IJ SOLN
INTRAMUSCULAR | Status: AC
  Filled 2022-02-24: qty 5

## 2022-02-24 MED ORDER — ROPIVACAINE HCL 5 MG/ML IJ SOLN
INTRAMUSCULAR | Status: DC | PRN
  Administered 2022-02-24 (×4): 5 mL via PERINEURAL

## 2022-02-24 MED ORDER — KETOROLAC TROMETHAMINE 30 MG/ML IJ SOLN
INTRAMUSCULAR | Status: DC | PRN
  Administered 2022-02-24: 30 mg via INTRAVENOUS

## 2022-02-24 MED ORDER — DEXAMETHASONE SODIUM PHOSPHATE 10 MG/ML IJ SOLN
INTRAMUSCULAR | Status: AC
  Filled 2022-02-24: qty 1

## 2022-02-24 SURGICAL SUPPLY — 109 items
APL PRP STRL LF DISP 70% ISPRP (MISCELLANEOUS) ×2
BANDAGE ESMARK 6X9 LF (GAUZE/BANDAGES/DRESSINGS) ×2 IMPLANT
BIT DRILL 2.1 (BIT) IMPLANT
BIT DRILL CANN AO ASNIS 2.1 (DRILL) IMPLANT
BIT DRILL CANNULAT MICA 2.2X60 (BIT) ×1 IMPLANT
BLADE AVERAGE 25X9 (BLADE) ×3 IMPLANT
BLADE MINI RND TIP GREEN BEAV (BLADE) ×3 IMPLANT
BLADE OSC/SAG .038X5.5 CUT EDG (BLADE) ×3 IMPLANT
BLADE SAW LAPIPLASTY 40X11 (BLADE) ×1 IMPLANT
BLADE SURG 15 STRL LF DISP TIS (BLADE) ×4 IMPLANT
BLADE SURG 15 STRL SS (BLADE) ×6
BNDG CMPR 9X4 STRL LF SNTH (GAUZE/BANDAGES/DRESSINGS)
BNDG CMPR 9X6 STRL LF SNTH (GAUZE/BANDAGES/DRESSINGS) ×2
BNDG CONFORM 2 STRL LF (GAUZE/BANDAGES/DRESSINGS) ×3 IMPLANT
BNDG ELASTIC 3X5.8 VLCR STR LF (GAUZE/BANDAGES/DRESSINGS) ×3 IMPLANT
BNDG ELASTIC 4X5.8 VLCR STR LF (GAUZE/BANDAGES/DRESSINGS) ×3 IMPLANT
BNDG ESMARK 4X9 LF (GAUZE/BANDAGES/DRESSINGS) IMPLANT
BNDG ESMARK 6X9 LF (GAUZE/BANDAGES/DRESSINGS) ×3
BNDG GAUZE ELAST 4 BULKY (GAUZE/BANDAGES/DRESSINGS) ×3 IMPLANT
BUR MICA 2X12 (BURR) IMPLANT
BURR MICA 2X12 (BURR) ×3
CHLORAPREP W/TINT 26 (MISCELLANEOUS) ×3 IMPLANT
CNTNR URN SCR LID CUP LEK RST (MISCELLANEOUS) IMPLANT
CONT SPEC 4OZ STRL OR WHT (MISCELLANEOUS)
COUNTERSINK CANN 3X3 NSTRL (BIT) ×3
COVER BACK TABLE 60X90IN (DRAPES) ×3 IMPLANT
CUFF TOURN SGL QUICK 18X4 (TOURNIQUET CUFF) IMPLANT
CUFF TOURN SGL QUICK 24 (TOURNIQUET CUFF)
CUFF TRNQT CYL 24X4X16.5-23 (TOURNIQUET CUFF) IMPLANT
DECANTER SPIKE VIAL GLASS SM (MISCELLANEOUS) IMPLANT
DRAPE 3/4 80X56 (DRAPES) ×3 IMPLANT
DRAPE C-ARM 35X43 STRL (DRAPES) ×3 IMPLANT
DRAPE EXTREMITY T 121X128X90 (DISPOSABLE) ×3 IMPLANT
DRAPE IMP U-DRAPE 54X76 (DRAPES) ×3 IMPLANT
DRAPE OEC MINIVIEW 54X84 (DRAPES) ×3 IMPLANT
DRAPE SHEET LG 3/4 BI-LAMINATE (DRAPES) ×3 IMPLANT
DRAPE U-SHAPE 47X51 STRL (DRAPES) ×3 IMPLANT
DRILL BIT 2.1 (BIT) ×3
DRILL CANN AO ASNIS 2.1 (DRILL) ×3
ELECT REM PT RETURN 15FT ADLT (MISCELLANEOUS) ×3 IMPLANT
ELECT REM PT RETURN 9FT ADLT (ELECTROSURGICAL) ×3
ELECTRODE REM PT RTRN 9FT ADLT (ELECTROSURGICAL) ×2 IMPLANT
GAUGE DEPTH MICA PROSTEP 150 (MISCELLANEOUS) ×1 IMPLANT
GAUZE 4X4 16PLY ~~LOC~~+RFID DBL (SPONGE) ×3 IMPLANT
GAUZE SPONGE 4X4 12PLY STRL (GAUZE/BANDAGES/DRESSINGS) ×3 IMPLANT
GAUZE XEROFORM 1X8 LF (GAUZE/BANDAGES/DRESSINGS) ×3 IMPLANT
GLOVE BIO SURGEON STRL SZ 6.5 (GLOVE) ×2 IMPLANT
GLOVE BIO SURGEON STRL SZ7 (GLOVE) ×3 IMPLANT
GLOVE BIOGEL PI IND STRL 7.5 (GLOVE) ×2 IMPLANT
GLOVE BIOGEL PI IND STRL 8 (GLOVE) ×2 IMPLANT
GLOVE BIOGEL PI INDICATOR 7.5 (GLOVE) ×1
GLOVE BIOGEL PI INDICATOR 8 (GLOVE) ×1
GOWN STRL REUS W/TWL LRG LVL3 (GOWN DISPOSABLE) ×3 IMPLANT
INST FASTGRAFTER HARVEST SYS (INSTRUMENTS)
INST GUIDED SPEEDRELEASE (INSTRUMENTS) ×3
INSTRUMENT FASTGRFTR HRVST SYS (INSTRUMENTS) IMPLANT
INSTRUMENT GUIDED SPEEDRELEASE (INSTRUMENTS) IMPLANT
K-WIRE 1.2X100 (WIRE) ×3
K-WIRE BLUNT/TROCAR 0.9X150 (WIRE) ×6
K-WIRE CAPS STERILE WHITE .045 (WIRE) ×3 IMPLANT
K-WIRE DBL END TROCAR 6X.045 (WIRE) ×3
K-WIRE DBL TROCAR .045X4 (WIRE) ×3
KIT TURNOVER CYSTO (KITS) ×3 IMPLANT
KWIRE 1.2X100 (WIRE) IMPLANT
KWIRE BLUNT/TROCAR 0.9X150 (WIRE) IMPLANT
KWIRE DBL END TROCAR 6X.045 (WIRE) ×2 IMPLANT
KWIRE DBL TROCAR .045X4 (WIRE) ×2 IMPLANT
LAPIPLASTY SYS 4A (Orthopedic Implant) ×3 IMPLANT
MANIFOLD NEPTUNE II (INSTRUMENTS) ×3 IMPLANT
NDL HYPO 25X1 1.5 SAFETY (NEEDLE) ×2 IMPLANT
NDL SAFETY ECLIPSE 18X1.5 (NEEDLE) IMPLANT
NEEDLE HYPO 18GX1.5 SHARP (NEEDLE)
NEEDLE HYPO 25X1 1.5 SAFETY (NEEDLE) ×3 IMPLANT
NS IRRIG 1000ML POUR BTL (IV SOLUTION) ×3 IMPLANT
PACK BASIN DAY SURGERY FS (CUSTOM PROCEDURE TRAY) ×3 IMPLANT
PADDING CAST ABS 4INX4YD NS (CAST SUPPLIES) ×1
PADDING CAST ABS COTTON 4X4 ST (CAST SUPPLIES) ×2 IMPLANT
PENCIL SMOKE EVACUATOR (MISCELLANEOUS) ×3 IMPLANT
SCREW 2.7 HIGH PITCH LOCKING (Screw) ×2 IMPLANT
SCREW COUNTRSNK CANN 3X3 NSTRL (BIT) IMPLANT
SCREW HEADED 3.0X34 (Screw) ×1 IMPLANT
SCREW MICA PROSTEP 3.0X20 (Screw) ×1 IMPLANT
SET IRRIG Y TYPE TUR BLADDER L (SET/KITS/TRAYS/PACK) IMPLANT
SPONGE T-LAP 4X18 ~~LOC~~+RFID (SPONGE) ×3 IMPLANT
STAPLER VISISTAT 35W (STAPLE) IMPLANT
STOCKINETTE 6  STRL (DRAPES) ×1
STOCKINETTE 6 STRL (DRAPES) ×2 IMPLANT
SUCTION FRAZIER HANDLE 10FR (MISCELLANEOUS) ×1
SUCTION TUBE FRAZIER 10FR DISP (MISCELLANEOUS) ×2 IMPLANT
SUT ETHILON 3 0 PS 1 (SUTURE) ×6 IMPLANT
SUT ETHILON 4 0 PS 2 18 (SUTURE) ×1 IMPLANT
SUT MNCRL AB 3-0 PS2 18 (SUTURE) ×4 IMPLANT
SUT MNCRL AB 4-0 PS2 18 (SUTURE) ×4 IMPLANT
SUT MON AB 5-0 PS2 18 (SUTURE) ×3 IMPLANT
SUT VIC AB 2-0 SH 27 (SUTURE) ×3
SUT VIC AB 2-0 SH 27XBRD (SUTURE) IMPLANT
SUT VIC AB 3-0 SH 27 (SUTURE)
SUT VIC AB 3-0 SH 27X BRD (SUTURE) IMPLANT
SYR 20ML LL LF (SYRINGE) ×3 IMPLANT
SYR BULB EAR ULCER 3OZ GRN STR (SYRINGE) ×3 IMPLANT
SYR CONTROL 10ML LL (SYRINGE) ×3 IMPLANT
SYSTEM LAPIPLASTY 4A (Orthopedic Implant) IMPLANT
TOWEL OR 17X26 10 PK STRL BLUE (TOWEL DISPOSABLE) ×3 IMPLANT
TRAY DSU PREP LF (CUSTOM PROCEDURE TRAY) ×2 IMPLANT
TUBE CONNECTING 12X1/4 (SUCTIONS) ×3 IMPLANT
TUBE IRRIGATION SET MISONIX (TUBING) IMPLANT
TUBE SURG IRRIGATION (TUBING) ×1 IMPLANT
UNDERPAD 30X36 HEAVY ABSORB (UNDERPADS AND DIAPERS) ×3 IMPLANT
YANKAUER SUCT BULB TIP NO VENT (SUCTIONS) ×3 IMPLANT

## 2022-02-24 NOTE — Interval H&P Note (Signed)
History and Physical Interval Note:  02/24/2022 7:16 AM  Tiffany Berg  has presented today for surgery, with the diagnosis of HALLUX VALGUS WITH BUNION AND HAMMER TOE OF LEFT FOOT.  The various methods of treatment have been discussed with the patient and family. After consideration of risks, benefits and other options for treatment, the patient has consented to  Procedure(s): HALLUX VALGUS LAPIDUS (Left) AIKEN OSTEOTOMY (Left) HAMMER TOE CORRECTION SECOND TOE LEFT FOOT (Left) BONE GRAFT (Left) as a surgical intervention.  The patient's history has been reviewed, patient examined, no change in status, stable for surgery.  I have reviewed the patient's chart and labs.  Questions were answered to the patient's satisfaction.     Edwin Cap

## 2022-02-24 NOTE — Brief Op Note (Addendum)
02/24/2022  10:49 AM  PATIENT:  Tiffany Berg  66 y.o. female  PRE-OPERATIVE DIAGNOSIS:  HALLUX VALGUS WITH BUNION AND HAMMER TOE OF LEFT FOOT  POST-OPERATIVE DIAGNOSIS:  HALLUX VALGUS WITH BUNION AND HAMMER TOE OF LEFT   PROCEDURE:  Procedure(s): HALLUX VALGUS LAPIDUS (Left) AIKEN OSTEOTOMY (Left) HAMMER TOE CORRECTION SECOND TOE LEFT FOOT (Left) BONE GRAFT (Left)  SURGEON:  Surgeon(s) and Role:    * Nioka Thorington, Rachelle Hora, DPM - Primary   ASSISTANTS: none   ANESTHESIA:   general and ankle block  EBL:  10 mL   BLOOD ADMINISTERED:none  DRAINS: none   LOCAL MEDICATIONS USED:  MARCAINE    and Amount: 20 ml  SPECIMEN:  No Specimen  DISPOSITION OF SPECIMEN:  N/A  COUNTS:  YES  TOURNIQUET:   Total Tourniquet Time Documented: Ankle (laterality) - 121 minutes Total: Ankle (laterality) - 121 minutes   DICTATION: .Note written in EPIC  PLAN OF CARE: Discharge to home after PACU  PATIENT DISPOSITION:  PACU - hemodynamically stable.   Delay start of Pharmacological VTE agent (>24hrs) due to surgical blood loss or risk of bleeding: n/a

## 2022-02-25 DIAGNOSIS — M8970 Major osseous defect, unspecified site: Secondary | ICD-10-CM

## 2022-02-25 IMAGING — DX DG FOOT COMPLETE 3+V*R*
3 series · 3 of 3 positions shown · non-contrast
Comparison: 03/12/2020

CLINICAL DATA: Postop imaging ease for hammertoe correction.

EXAM:
RIGHT FOOT COMPLETE - 3+ VIEW

[foot ap]
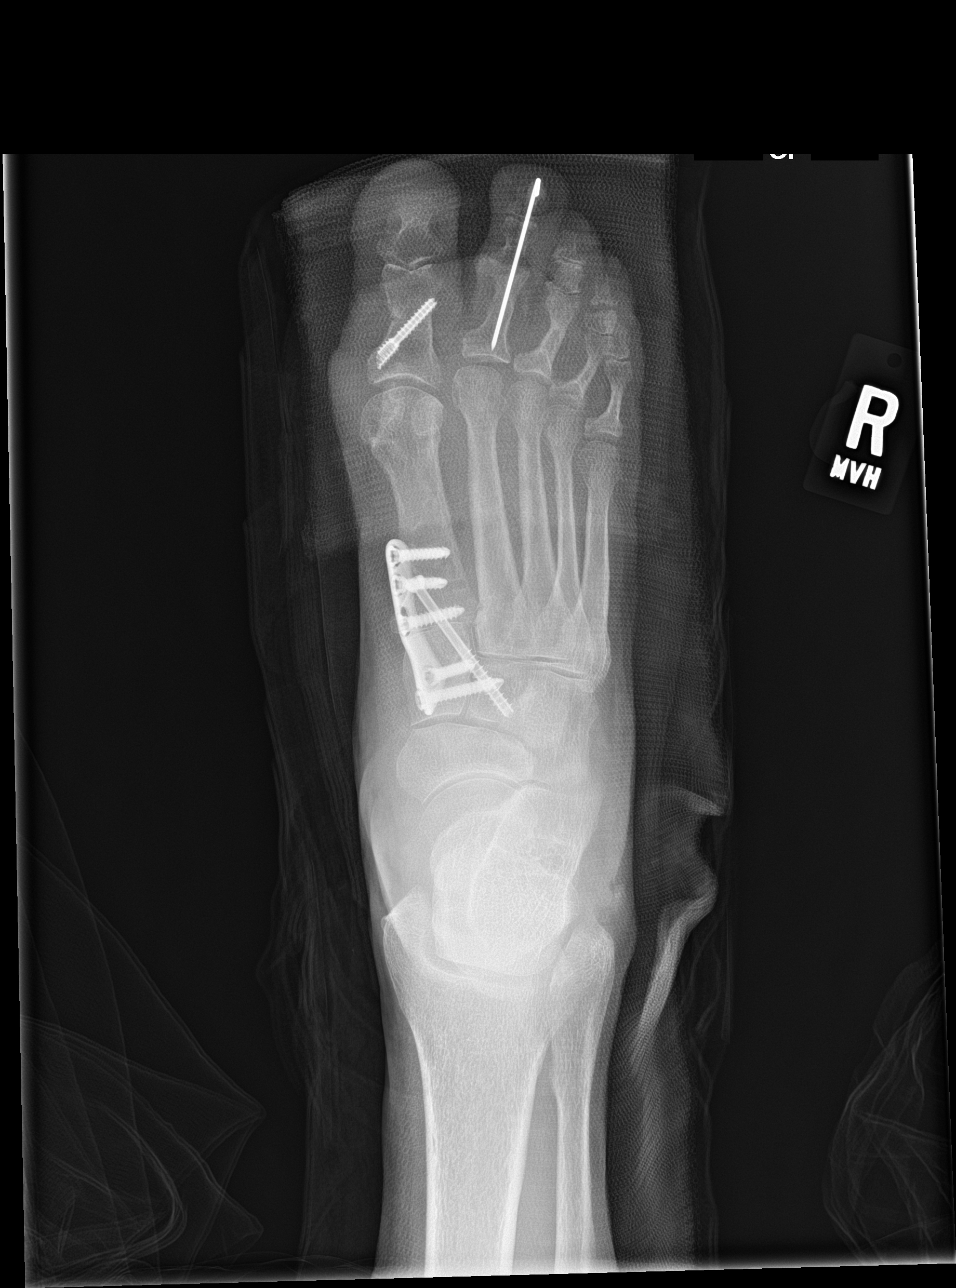

[foot obl]
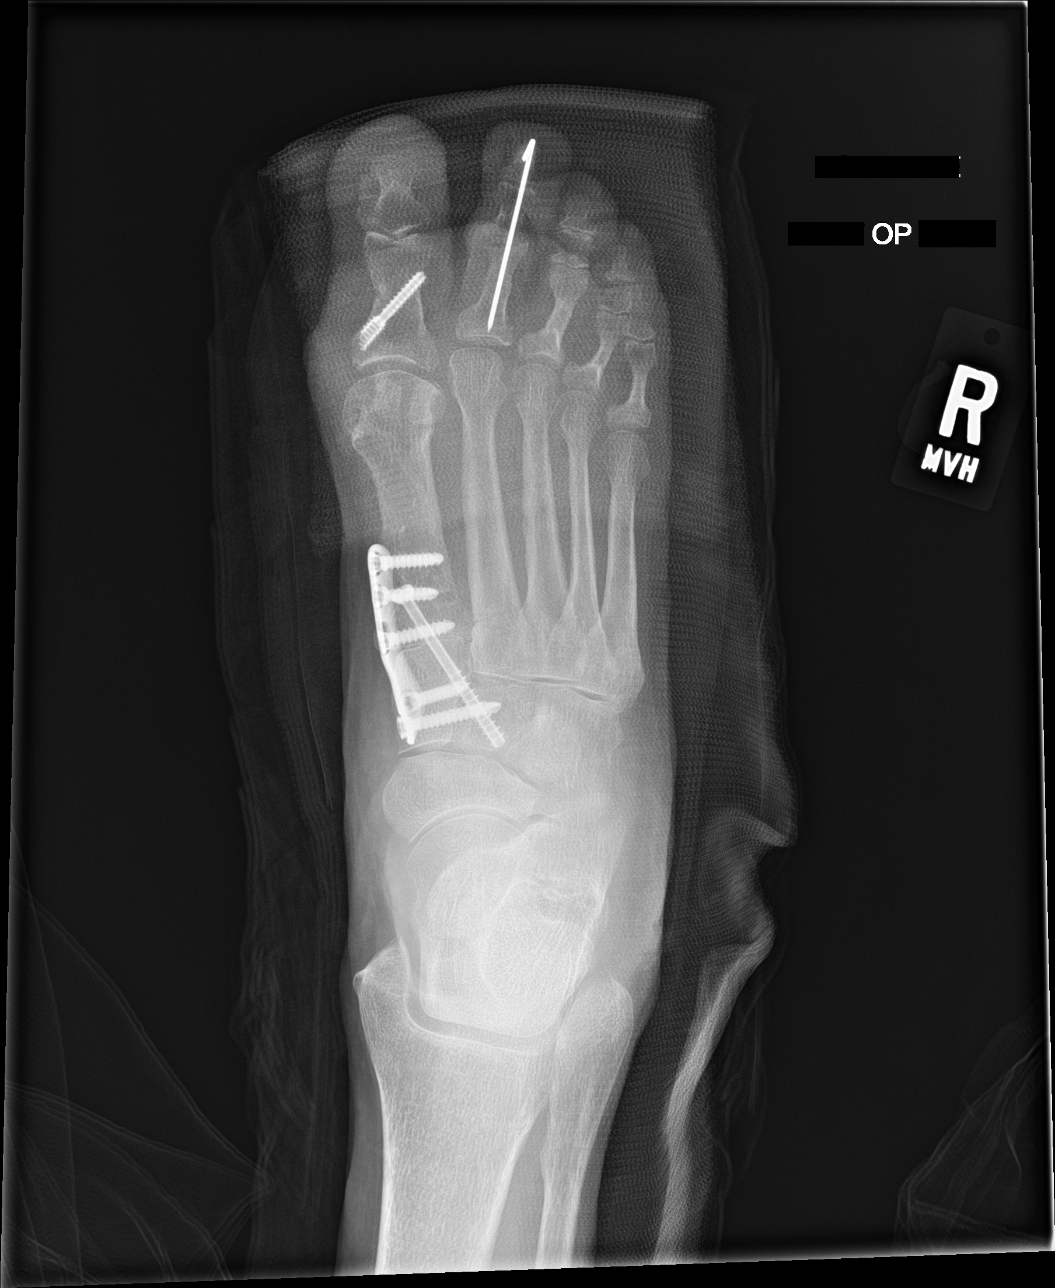

[foot lat]
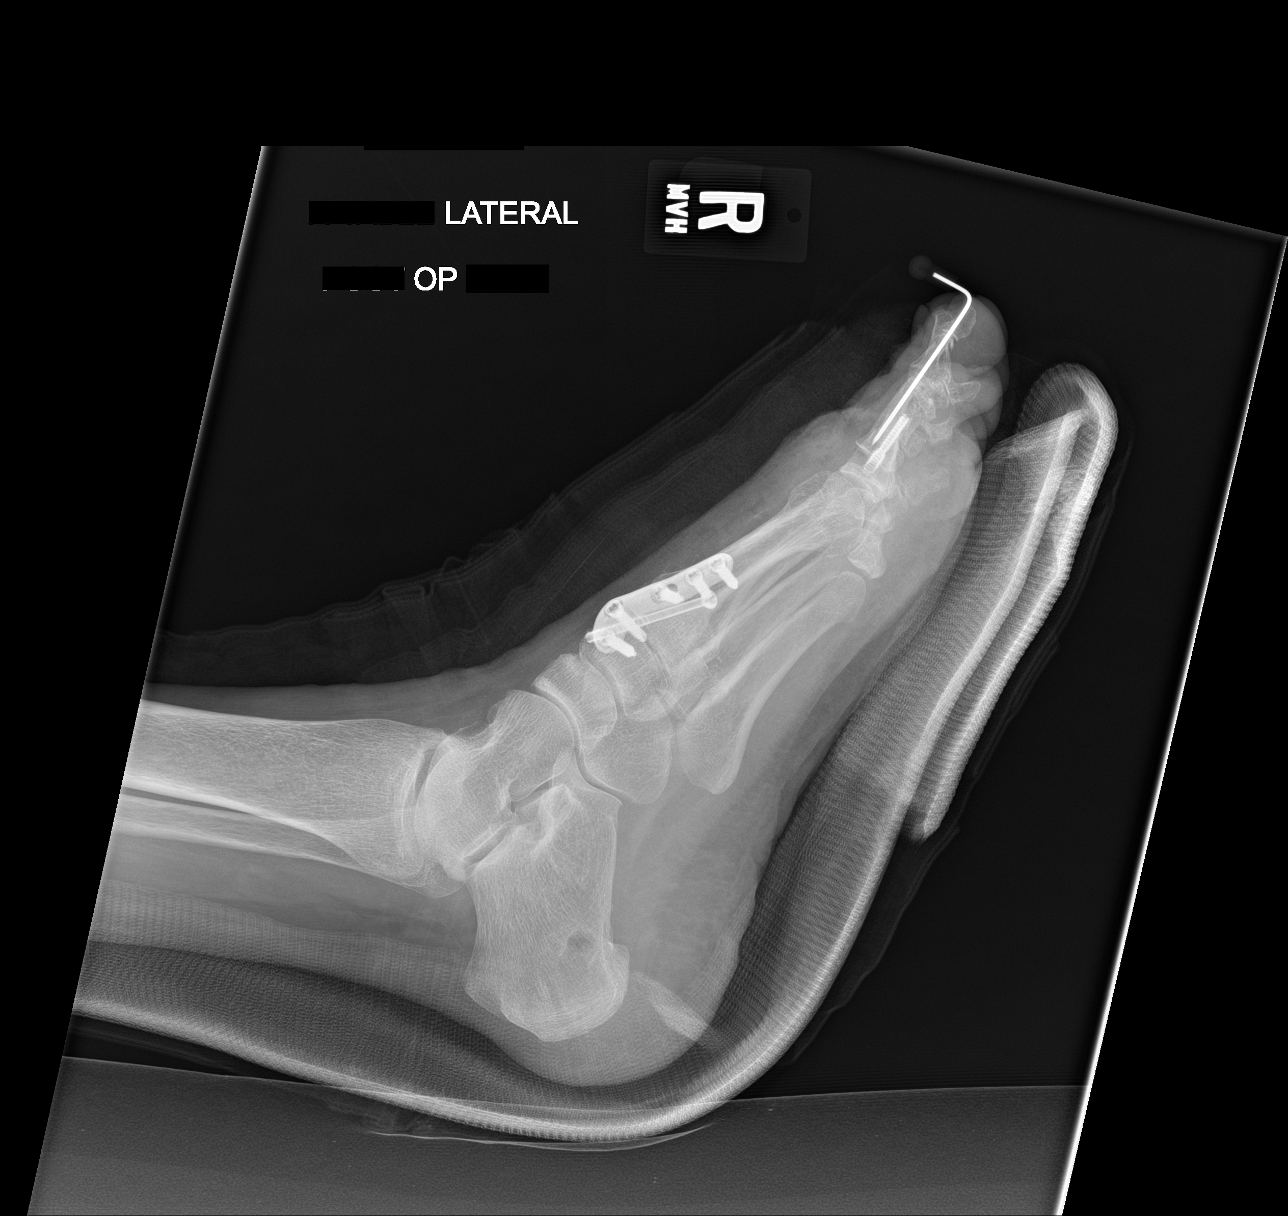

[3 of 3 positions shown; findings below may reference images not displayed]

FINDINGS: Fusion performed between the medial cuneiform and the first
metatarsal. There is a dorsal medial fixation plate with 5
associated well seated screws. An additional screw extends from
medial aspect of the proximal first metatarsal across the medial to
the middle cuneiform, also well seated.

Osteotomy has been performed across the mid proximal phalanx of the
great toe, fixated with an oblique screw.

There is a K-wire that extends longitudinally from the distal
phalanx of the second toe to the base of the proximal phalanx with
the phalanges well aligned.

No acute fracture or evidence of an operative complication.

Forefoot soft tissue swelling.
IMPRESSION: Postoperative changes of the right foot as detailed.

## 2022-02-27 ENCOUNTER — Encounter (HOSPITAL_BASED_OUTPATIENT_CLINIC_OR_DEPARTMENT_OTHER): Payer: Self-pay | Admitting: Podiatry

## 2022-02-28 ENCOUNTER — Ambulatory Visit (INDEPENDENT_AMBULATORY_CARE_PROVIDER_SITE_OTHER): Payer: Medicare Other | Admitting: Podiatry

## 2022-02-28 DIAGNOSIS — Z9889 Other specified postprocedural states: Secondary | ICD-10-CM

## 2022-03-02 ENCOUNTER — Ambulatory Visit (INDEPENDENT_AMBULATORY_CARE_PROVIDER_SITE_OTHER): Payer: Medicare Other

## 2022-03-02 ENCOUNTER — Ambulatory Visit (INDEPENDENT_AMBULATORY_CARE_PROVIDER_SITE_OTHER): Payer: Medicare Other | Admitting: Podiatry

## 2022-03-02 DIAGNOSIS — M2042 Other hammer toe(s) (acquired), left foot: Secondary | ICD-10-CM

## 2022-03-02 DIAGNOSIS — M21612 Bunion of left foot: Secondary | ICD-10-CM

## 2022-03-02 DIAGNOSIS — M2012 Hallux valgus (acquired), left foot: Secondary | ICD-10-CM | POA: Diagnosis not present

## 2022-03-04 NOTE — Progress Notes (Signed)
Patient presented today for postoperative eval urgently she noted bleeding through the dressing.  The dressing was removed there was some blood oozing from the midfoot incision but nothing pulsatile there is no evidence of hematoma.  A Steri-Strip was placed across the site which stopped the bleeding immediately.  Pressure was held and a clean sterile dressing was applied.  She will return for scheduled visit this week as well.

## 2022-03-05 NOTE — Progress Notes (Signed)
  Subjective:  Patient ID: Tiffany Berg, female    DOB: 1956-03-11,  MRN: [id number]  Chief Complaint  Patient presents with   Routine Post Op      POV #1 DOS 02/24/2022 LAPIDUS PROCEDURE INC BUNION LT, AIKEN OSTEOTOMY LT, HAMMERTOE REPAIT 2ND LT, BONE GRAFT      66 y.o. female returns for post-op check.  She is doing better says the pain is improving there is no further bleeding noted  Review of Systems: Negative except as noted in the HPI. Denies N/V/F/Ch.   Objective:  There were no vitals filed for this visit. There is no height or weight on file to calculate BMI. Constitutional Well developed. Well nourished.  Vascular Foot warm and well perfused. Capillary refill normal to all digits.  Calf is soft and supple, no posterior calf or knee pain, negative Homans' sign  Neurologic Normal speech. Oriented to person, place, and time. Epicritic sensation to light touch grossly present bilaterally.  Dermatologic Skin healing well without signs of infection. Skin edges well coapted without signs of infection.  No further bleeding noted dressings are clean dry and intact  Orthopedic: Tenderness to palpation noted about the surgical site.   Multiple view plain film radiographs: Good correction noted, there is good position of all hardware and equivalent to immediate postoperative films Assessment:   1. Hallux valgus with bunions, left   2. Hammertoe of left foot    Plan:  Patient was evaluated and treated and all questions answered.  S/p foot surgery left -Progressing as expected post-operatively. -XR: Noted above -WB Status: NWB in CAM boot -Sutures: Plan to remove in 2 weeks. -Medications: No refills required -Foot redressed.  No follow-ups on file.

## 2022-03-06 ENCOUNTER — Other Ambulatory Visit: Payer: Self-pay | Admitting: Podiatry

## 2022-03-06 MED ORDER — OXYCODONE-ACETAMINOPHEN 5-325 MG PO TABS: 1.0000 | ORAL_TABLET | ORAL | 0 refills | Status: DC | PRN

## 2022-03-06 NOTE — Progress Notes (Signed)
PRN postop 

## 2022-03-16 ENCOUNTER — Ambulatory Visit (INDEPENDENT_AMBULATORY_CARE_PROVIDER_SITE_OTHER): Payer: Medicare Other | Admitting: Podiatry

## 2022-03-16 DIAGNOSIS — M21612 Bunion of left foot: Secondary | ICD-10-CM

## 2022-03-16 DIAGNOSIS — M2042 Other hammer toe(s) (acquired), left foot: Secondary | ICD-10-CM

## 2022-03-16 DIAGNOSIS — M2012 Hallux valgus (acquired), left foot: Secondary | ICD-10-CM

## 2022-03-16 MED ORDER — IBUPROFEN 600 MG PO TABS: 600.0000 mg | ORAL_TABLET | Freq: Four times a day (QID) | ORAL | 0 refills | Status: AC | PRN

## 2022-03-16 MED ORDER — OXYCODONE HCL 5 MG PO TABS: 5.0000 mg | ORAL_TABLET | ORAL | 0 refills | Status: AC | PRN

## 2022-03-18 NOTE — Progress Notes (Signed)
  Subjective:  Patient ID: Tiffany Berg, female    DOB: 1956/07/19,  MRN: [id number]  Chief Complaint  Patient presents with   Post-op Follow-up    Post op #2 suture removal       66 y.o. female returns for post-op check.  Overall doing well still taking the pain medication and would like a refill  Review of Systems: Negative except as noted in the HPI. Denies N/V/F/Ch.   Objective:  There were no vitals filed for this visit. There is no height or weight on file to calculate BMI. Constitutional Well developed. Well nourished.  Vascular Foot warm and well perfused. Capillary refill normal to all digits.  Calf is soft and supple, no posterior calf or knee pain, negative Homans' sign  Neurologic Normal speech. Oriented to person, place, and time. Epicritic sensation to light touch grossly present bilaterally.  Dermatologic Skin healing well without signs of infection. Skin edges well coapted without signs of infection.  Orthopedic: Tenderness to palpation noted about the surgical site.   Multiple view plain film radiographs: Good correction noted, there is good position of all hardware and equivalent to immediate postoperative films Assessment:   1. Hallux valgus with bunions, left   2. Hammertoe of left foot     Plan:  Patient was evaluated and treated and all questions answered.  S/p foot surgery left -Progressing as expected post-operatively. -XR: Noted above -WB Status: May begin gradual WBAT in CAM boot -Sutures: Removed today -Medications: Refill of oxycodone and ibuprofen sent to pharmacy -Foot redressed.  Return in about 3 weeks (around 04/06/2022) for post op (new x-rays) pin removal .

## 2022-03-21 ENCOUNTER — Telehealth: Payer: Self-pay | Admitting: *Deleted

## 2022-03-21 NOTE — Telephone Encounter (Signed)
Patient's daughter is asking if patient is able to travel, an emergency has developed and needs to fly to [place], leaving Saturday. Please advise.

## 2022-03-22 NOTE — Telephone Encounter (Signed)
Discussed via secure chat she is OK to travel but should use knee scooter

## 2022-03-23 ENCOUNTER — Telehealth: Payer: Self-pay | Admitting: Podiatry

## 2022-03-23 NOTE — Telephone Encounter (Signed)
Patients daughter would like to know if it's okay for her mom to travel.   Please advise.

## 2022-04-06 ENCOUNTER — Encounter: Payer: Medicare Other | Admitting: Podiatry

## 2022-04-17 ENCOUNTER — Ambulatory Visit (INDEPENDENT_AMBULATORY_CARE_PROVIDER_SITE_OTHER): Payer: Medicare Other | Admitting: Podiatry

## 2022-04-17 ENCOUNTER — Ambulatory Visit (INDEPENDENT_AMBULATORY_CARE_PROVIDER_SITE_OTHER): Payer: Medicare Other

## 2022-04-17 DIAGNOSIS — M21612 Bunion of left foot: Secondary | ICD-10-CM

## 2022-04-17 DIAGNOSIS — Z9889 Other specified postprocedural states: Secondary | ICD-10-CM

## 2022-04-17 DIAGNOSIS — M2012 Hallux valgus (acquired), left foot: Secondary | ICD-10-CM

## 2022-04-17 DIAGNOSIS — M2042 Other hammer toe(s) (acquired), left foot: Secondary | ICD-10-CM

## 2022-04-17 MED ORDER — OXYCODONE-ACETAMINOPHEN 5-325 MG PO TABS: 1.0000 | ORAL_TABLET | ORAL | 0 refills | Status: DC | PRN

## 2022-04-19 NOTE — Progress Notes (Signed)
  Subjective:  Patient ID: Tiffany Berg, female    DOB: 11-05-1955,  MRN: [id number]  Chief Complaint  Patient presents with   Routine Post Op    POV #3 DOS 02/24/2022 LAPIDUS PROCEDURE INC BUNION LT, AIKEN OSTEOTOMY LT, HAMMERTOE REPAIT 2ND LT, BONE GRAFT      66 y.o. female returns for post-op check.  Still having enough pain that she does take some medication.  Review of Systems: Negative except as noted in the HPI. Denies N/V/F/Ch.   Objective:  There were no vitals filed for this visit. There is no height or weight on file to calculate BMI. Constitutional Well developed. Well nourished.  Vascular Foot warm and well perfused. Capillary refill normal to all digits.  Calf is soft and supple, no posterior calf or knee pain, negative Homans' sign  Neurologic Normal speech. Oriented to person, place, and time. Epicritic sensation to light touch grossly present bilaterally.  Dermatologic Incisions are well-healed and not hypertrophic  Orthopedic: Tenderness to palpation noted about the surgical site.  Mild edema present still   Multiple view plain film radiographs: Good correction is maintained, there is still some lucency at the arthrodesis site of the first tarsometatarsal joint, increasing concern on Exogen across the PIPJ arthrodesis site and the toe Assessment:   1. Hallux valgus with bunions, left   2. Hammertoe of left foot   3. Post-operative state     Plan:  Patient was evaluated and treated and all questions answered.  S/p foot surgery left -Continue WBAT in CAM boot for 3 to 4 weeks -Kirschner wire removed uneventfully. -She may resume regular bathing -I did send her additional pain medication refill she will take only as needed  Return in about 4 weeks (around 05/15/2022) for post op (new x-rays).

## 2022-04-21 ENCOUNTER — Ambulatory Visit (INDEPENDENT_AMBULATORY_CARE_PROVIDER_SITE_OTHER): Payer: Medicare Other

## 2022-04-21 DIAGNOSIS — I442 Atrioventricular block, complete: Secondary | ICD-10-CM | POA: Diagnosis not present

## 2022-04-21 LAB — CUP PACEART REMOTE DEVICE CHECK
Battery Remaining Longevity: 105 mo
Battery Voltage: 3 V
Brady Statistic AP VP Percent: 39.85 %
Brady Statistic AP VS Percent: 1.47 %
Brady Statistic AS VP Percent: 48.24 %
Brady Statistic AS VS Percent: 10.44 %
Brady Statistic RA Percent Paced: 41.31 %
Brady Statistic RV Percent Paced: 88.1 %
Date Time Interrogation Session: 20230818023608
Implantable Lead Implant Date: 20210711
Implantable Lead Implant Date: 20210711
Implantable Lead Location: 753859
Implantable Lead Location: 753860
Implantable Lead Model: 5076
Implantable Lead Model: 5076
Implantable Pulse Generator Implant Date: 20210711
Lead Channel Impedance Value: 323 Ohm
Lead Channel Impedance Value: 342 Ohm
Lead Channel Impedance Value: 380 Ohm
Lead Channel Impedance Value: 551 Ohm
Lead Channel Pacing Threshold Amplitude: 0.625 V
Lead Channel Pacing Threshold Amplitude: 0.875 V
Lead Channel Pacing Threshold Pulse Width: 0.4 ms
Lead Channel Pacing Threshold Pulse Width: 0.4 ms
Lead Channel Sensing Intrinsic Amplitude: 3.375 mV
Lead Channel Sensing Intrinsic Amplitude: 3.375 mV
Lead Channel Sensing Intrinsic Amplitude: 4 mV
Lead Channel Sensing Intrinsic Amplitude: 4 mV
Lead Channel Setting Pacing Amplitude: 1.75 V
Lead Channel Setting Pacing Amplitude: 2.5 V
Lead Channel Setting Pacing Pulse Width: 0.4 ms
Lead Channel Setting Sensing Sensitivity: 1.2 mV

## 2022-04-26 ENCOUNTER — Other Ambulatory Visit: Payer: Self-pay | Admitting: Family Medicine

## 2022-04-26 DIAGNOSIS — E039 Hypothyroidism, unspecified: Secondary | ICD-10-CM

## 2022-05-15 ENCOUNTER — Ambulatory Visit (INDEPENDENT_AMBULATORY_CARE_PROVIDER_SITE_OTHER): Payer: Medicare Other | Admitting: Podiatry

## 2022-05-15 ENCOUNTER — Ambulatory Visit (INDEPENDENT_AMBULATORY_CARE_PROVIDER_SITE_OTHER): Payer: Medicare Other | Admitting: Internal Medicine

## 2022-05-15 ENCOUNTER — Ambulatory Visit (INDEPENDENT_AMBULATORY_CARE_PROVIDER_SITE_OTHER): Payer: Medicare Other

## 2022-05-15 ENCOUNTER — Encounter: Payer: Self-pay | Admitting: Internal Medicine

## 2022-05-15 ENCOUNTER — Encounter: Payer: Self-pay | Admitting: Podiatry

## 2022-05-15 VITALS — BP 120/84 | HR 94 | Temp 98.4°F

## 2022-05-15 DIAGNOSIS — M21612 Bunion of left foot: Secondary | ICD-10-CM

## 2022-05-15 DIAGNOSIS — J069 Acute upper respiratory infection, unspecified: Secondary | ICD-10-CM

## 2022-05-15 DIAGNOSIS — M2012 Hallux valgus (acquired), left foot: Secondary | ICD-10-CM

## 2022-05-15 DIAGNOSIS — N3001 Acute cystitis with hematuria: Secondary | ICD-10-CM | POA: Diagnosis not present

## 2022-05-15 DIAGNOSIS — Z9889 Other specified postprocedural states: Secondary | ICD-10-CM

## 2022-05-15 DIAGNOSIS — M2042 Other hammer toe(s) (acquired), left foot: Secondary | ICD-10-CM

## 2022-05-15 LAB — POCT URINALYSIS DIPSTICK
Bilirubin, UA: NEGATIVE
Blood, UA: POSITIVE
Glucose, UA: NEGATIVE
Ketones, UA: NEGATIVE
Nitrite, UA: NEGATIVE
Protein, UA: NEGATIVE
Spec Grav, UA: 1.01 (ref 1.010–1.025)
Urobilinogen, UA: 0.2 E.U./dL
pH, UA: 6 (ref 5.0–8.0)

## 2022-05-15 MED ORDER — SULFAMETHOXAZOLE-TRIMETHOPRIM 800-160 MG PO TABS: 1.0000 | ORAL_TABLET | Freq: Two times a day (BID) | ORAL | 0 refills | Status: AC

## 2022-05-15 MED ORDER — BENZONATATE 100 MG PO CAPS: 100.0000 mg | ORAL_CAPSULE | Freq: Two times a day (BID) | ORAL | 0 refills | Status: DC | PRN

## 2022-05-15 NOTE — Progress Notes (Signed)
Established Patient Office Visit     CC/Reason for Visit: Discuss acute concerns  HPI: Tiffany Berg is a 66 y.o. female who is coming in today for the above mentioned reasons.  She is here today to discuss 2 main issues:  1.  She has been having URI symptoms for the past 6 days.  It started with a sore throat, progressed to myalgias, runny nose.  Cough is significant and is impeding her sleep.  2.  She has been having dysuria and has noticed a dark color to her urine.   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   w/ symptomatic bradycardia, rate 30s;  s/p  PPM 03-14-2020   Myalgia, multiple sites    Pre-diabetes    Symptomatic bradycardia 03/12/2020   s/p PPM   Wears glasses    Wears partial dentures    upper and lower    Past Surgical History:  Procedure Laterality Date   Barbie Banner OSTEOTOMY Right 10/15/2020   Procedure: Barbie Banner OSTEOTOMY;  Surgeon: Criselda Peaches, DPM;  Location: [facility];  Service: Podiatry;  Laterality: Right;  REGIONAL BLOCK   AIKEN OSTEOTOMY Left 02/24/2022   Procedure: Barbie Banner OSTEOTOMY;  Surgeon: Criselda Peaches, DPM;  Location: [facility];  Service: Podiatry;  Laterality: Left;   ANTERIOR AND POSTERIOR REPAIR N/A 02/18/2015   Procedure: ANTERIOR (CYSTOCELE)  AND POSTERIOR REPAIR (RECTOCELE);  Surgeon: Everett Graff, MD;  Location: [facility];  Service: Gynecology;  Laterality: N/A;   BLADDER SUSPENSION N/A 02/18/2015   Procedure: TRANSVAGINAL TAPE (TVT) PROCEDURE;  Surgeon: Everett Graff, MD;  Location: [facility];  Service: Gynecology;  Laterality: N/A;   CYSTOSCOPY Bilateral 02/18/2015   Procedure: CYSTOSCOPY;  Surgeon: Everett Graff, MD;  Location: [facility];  Service: Gynecology;  Laterality: Bilateral;   GRAFT APPLICATION Left 6/64/4034   Procedure: BONE GRAFT;  Surgeon: Criselda Peaches, DPM;  Location: [facility];  Service: Podiatry;  Laterality: Left;   HALLUX VALGUS LAPIDUS Right 10/15/2020   Procedure: HALLUX VALGUS LAPIDUS AND BONE GRAFT;  Surgeon: Criselda Peaches, DPM;  Location: [facility];  Service: Podiatry;  Laterality: Right;  WITH IV SEDATION; REGIONAL BLOCK   HALLUX VALGUS LAPIDUS Left 02/24/2022   Procedure: HALLUX VALGUS LAPIDUS;  Surgeon: Criselda Peaches, DPM;  Location: [facility];  Service: Podiatry;  Laterality: Left;   HAMMER TOE SURGERY Right 10/15/2020   Procedure: HAMMER TOE CORRECTION;  Surgeon: Criselda Peaches, DPM;  Location: [facility];  Service: Podiatry;  Laterality: Right;  REGIONAL BLOCK   HAMMER TOE SURGERY Left 02/24/2022   Procedure: HAMMER TOE CORRECTION SECOND TOE LEFT FOOT;  Surgeon: Criselda Peaches, DPM;  Location: [facility];  Service: Podiatry;  Laterality: Left;  LAPAROSCOPIC ASSISTED VAGINAL HYSTERECTOMY N/A 02/18/2015   Procedure: LAPAROSCOPIC ASSISTED VAGINAL HYSTERECTOMY;  Surgeon: Everett Graff, MD;  Location: [facility];  Service: Gynecology;  Laterality: N/A;   MASTECTOMY, PARTIAL Left 08-12-2013  '@[facility]'$    excision another site   PACEMAKER IMPLANT N/A 03/14/2020   Procedure: PACEMAKER IMPLANT;  Surgeon: Constance Haw, MD;  Location: [facility];  Service: Cardiovascular;  Laterality: N/A;   RADIOACTIVE SEED GUIDED  PARTIAL MASTECTOMY/AXILLARY SENTINEL NODE BIOPSY/AXILLARY NODE DISSECTION Left 07-22-2013  '@[facility]'$    SALPINGOOPHORECTOMY Bilateral 02/18/2015   Procedure: BILATERAL SALPINGO OOPHORECTOMY;  Surgeon: Everett Graff, MD;  Location: [facility];  Service: Gynecology;  Laterality: Bilateral;    Social History:  reports that she has never smoked. She has never used smokeless tobacco. She reports current alcohol use. She reports that she does not use drugs.  Allergies: No Known Allergies  Family History:  Family History  Problem Relation Age of Onset   Heart disease Mother    Cancer Father        prostata   Cancer Sister 82       Breast     Current Outpatient Medications:    B Complex Vitamins (VITAMIN B COMPLEX PO), Take by mouth., Disp: , Rfl:    benzonatate (TESSALON) 100 MG capsule, Take 1 capsule (100 mg total) by mouth 2 (two) times daily as needed for cough., Disp: 20 capsule, Rfl: 0   lovastatin (MEVACOR) 40 MG tablet, TAKE 2 TABLETS BY MOUTH AT BEDTIME (Patient taking differently: Take 80 mg by mouth at bedtime.), Disp: 180 tablet, Rfl: 1   sulfamethoxazole-trimethoprim (BACTRIM DS) 800-160 MG tablet, Take 1 tablet by mouth 2 (two) times daily for 7 days., Disp: 14 tablet, Rfl: 0   fluticasone (FLONASE) 50 MCG/ACT nasal spray, Place 1 spray into both nostrils 2 (two) times daily., Disp: 16 g, Rfl: 0   gabapentin (NEURONTIN) 300 MG capsule, Take 1 capsule (300 mg total) by mouth 3 (three) times daily for 7 days., Disp: 21 capsule, Rfl: 0   levothyroxine (SYNTHROID) 75 MCG tablet, TAKE 1 TABLET BY MOUTH ONCE DAILY BEFORE BREAKFAST (Patient not taking: Reported on 05/15/2022), Disp: 90 tablet, Rfl: 0   oxyCODONE-acetaminophen (PERCOCET) 5-325 MG tablet, Take 1 tablet by mouth every 4 (four) hours as needed for severe pain. (Patient not taking: Reported on 05/15/2022), Disp: 30 tablet, Rfl: 0   tiZANidine (ZANAFLEX) 2 MG tablet, Take 1-2 tablets (2-4 mg total) by mouth every 6 (six) hours as  needed for muscle spasms. (Patient not taking: Reported on 05/15/2022), Disp: 60 tablet, Rfl: 1  Review of Systems:  Constitutional: Denies fever, chills, diaphoresis. HEENT: Denies photophobia, eye pain, redness,  mouth sores, trouble swallowing, neck pain, neck stiffness and tinnitus.   Respiratory: Denies SOB, DOE, chest tightness,  and wheezing.   Cardiovascular: Denies chest pain, palpitations and leg swelling.  Gastrointestinal: Denies nausea, vomiting, abdominal pain, diarrhea, constipation, blood in stool and abdominal distention.  Genitourinary: Denies  urgency, frequency, hematuria, flank pain and difficulty urinating.  Endocrine: Denies: hot or cold intolerance, sweats, changes in hair or nails, polyuria, polydipsia. Musculoskeletal: Denies myalgias, back pain, joint swelling, arthralgias and gait problem.  Skin: Denies pallor, rash and wound.  Neurological: Denies dizziness, seizures, syncope, weakness, light-headedness, numbness and headaches.  Hematological: Denies adenopathy. Easy bruising, personal or family bleeding history  Psychiatric/Behavioral: Denies suicidal ideation, mood changes, confusion, nervousness, sleep disturbance and agitation    Physical Exam: Vitals:   05/15/22 1547  BP: 120/84  Pulse:  94  Temp: 98.4 F (36.9 C)  TempSrc: Oral  SpO2: 97%    There is no height or weight on file to calculate BMI.   Constitutional: NAD, calm, comfortable Eyes: PERRL, lids and conjunctivae normal ENMT: Mucous membranes are moist. Posterior pharynx is erythematous but clear of any exudate or lesions. Normal dentition. Tympanic membrane is pearly white, no erythema or bulging. Respiratory: clear to auscultation bilaterally, no wheezing, no crackles. Normal respiratory effort. No accessory muscle use.  Cardiovascular: Regular rate and rhythm, no murmurs / rubs / gallops. No extremity edema.  Psychiatric: Normal judgment and insight. Alert and oriented x 3. Normal mood.     Impression and Plan:  Viral URI with cough - Plan: benzonatate (TESSALON) 100 MG capsule  Acute cystitis with hematuria - Plan: POCT urinalysis dipstick, sulfamethoxazole-trimethoprim (BACTRIM DS) 800-160 MG tablet  -In office urine dipstick with blood and leukocytes, treat with Bactrim for 7 days.  -Given exam findings, PNA, pharyngitis, ear infection are not likely, hence abx have not been prescribed for this purpose. -Have advised rest, fluids, OTC antihistamines, cough suppressants and mucinex. -RTC if no improvement in 10-14 days.  -She is requesting prescription cough medication, 20 tablets of Tessalon have been prescribed.   Time spent:30 minutes reviewing chart, interviewing and examining patient and formulating plan of care.    Lelon Frohlich, MD [facility]

## 2022-05-15 NOTE — Progress Notes (Signed)
  Subjective:  Patient ID: Tiffany Berg, female    DOB: 1956/01/28,  MRN: [id number]  Chief Complaint  Patient presents with   Routine Post Op    POV #4 DOS 02/24/2022 LAPIDUS PROCEDURE INC BUNION LT, AIKEN OSTEOTOMY LT, HAMMERTOE REPAIT 2ND LT, BONE GRAFT      66 y.o. female returns for post-op check.  Overall doing much better not having much pain  Review of Systems: Negative except as noted in the HPI. Denies N/V/F/Ch.   Objective:  There were no vitals filed for this visit. There is no height or weight on file to calculate BMI. Constitutional Well developed. Well nourished.  Vascular Foot warm and well perfused. Capillary refill normal to all digits.  Calf is soft and supple, no posterior calf or knee pain, negative Homans' sign  Neurologic Normal speech. Oriented to person, place, and time. Epicritic sensation to light touch grossly present bilaterally.  Dermatologic Incisions are well-healed and not hypertrophic  Orthopedic: Little to no edema or pain today   Multiple view plain film radiographs: Good correction is maintained, good fusion across arthrodesis sites there is some elevation of the metatarsal head Assessment:   1. Hallux valgus with bunions, left   2. Hammertoe of left foot   3. Post-operative state     Plan:  Patient was evaluated and treated and all questions answered.  S/p foot surgery left -Overall doing well can transition back to regular shoe gears at this point.  Avoid impact activity.  May walk as tolerated.  She will be able to go back to work on October 26  Return in about 8 weeks (around 07/10/2022) for surgery follow up (new xrays of left foot).

## 2022-05-16 LAB — URINALYSIS, ROUTINE W REFLEX MICROSCOPIC
Bilirubin Urine: NEGATIVE
Ketones, ur: NEGATIVE
Nitrite: NEGATIVE
Specific Gravity, Urine: <=1.005 — AB (ref 1.000–1.030)
Total Protein, Urine: NEGATIVE
Urine Glucose: NEGATIVE
Urobilinogen, UA: 0.2 (ref 0.0–1.0)
pH: 6 (ref 5.0–8.0)

## 2022-05-17 LAB — URINE CULTURE
MICRO NUMBER:: 13899642
SPECIMEN QUALITY:: ADEQUATE

## 2022-05-17 NOTE — Progress Notes (Signed)
Remote pacemaker transmission.   

## 2022-05-18 DIAGNOSIS — H40013 Open angle with borderline findings, low risk, bilateral: Secondary | ICD-10-CM | POA: Diagnosis not present

## 2022-05-18 DIAGNOSIS — H52213 Irregular astigmatism, bilateral: Secondary | ICD-10-CM | POA: Diagnosis not present

## 2022-05-18 DIAGNOSIS — H04123 Dry eye syndrome of bilateral lacrimal glands: Secondary | ICD-10-CM | POA: Diagnosis not present

## 2022-05-18 DIAGNOSIS — H40033 Anatomical narrow angle, bilateral: Secondary | ICD-10-CM | POA: Diagnosis not present

## 2022-05-18 DIAGNOSIS — H25813 Combined forms of age-related cataract, bilateral: Secondary | ICD-10-CM | POA: Diagnosis not present

## 2022-05-19 DIAGNOSIS — Z1231 Encounter for screening mammogram for malignant neoplasm of breast: Secondary | ICD-10-CM | POA: Diagnosis not present

## 2022-05-19 DIAGNOSIS — D0512 Intraductal carcinoma in situ of left breast: Secondary | ICD-10-CM | POA: Diagnosis not present

## 2022-06-01 NOTE — Telephone Encounter (Signed)
error 

## 2022-06-02 DIAGNOSIS — R928 Other abnormal and inconclusive findings on diagnostic imaging of breast: Secondary | ICD-10-CM | POA: Diagnosis not present

## 2022-06-29 DIAGNOSIS — H52213 Irregular astigmatism, bilateral: Secondary | ICD-10-CM | POA: Diagnosis not present

## 2022-06-29 DIAGNOSIS — H04123 Dry eye syndrome of bilateral lacrimal glands: Secondary | ICD-10-CM | POA: Diagnosis not present

## 2022-07-10 ENCOUNTER — Ambulatory Visit (INDEPENDENT_AMBULATORY_CARE_PROVIDER_SITE_OTHER): Payer: Medicare Other

## 2022-07-10 ENCOUNTER — Ambulatory Visit (INDEPENDENT_AMBULATORY_CARE_PROVIDER_SITE_OTHER): Payer: Medicare Other | Admitting: Podiatry

## 2022-07-10 DIAGNOSIS — M21612 Bunion of left foot: Secondary | ICD-10-CM

## 2022-07-10 DIAGNOSIS — M2042 Other hammer toe(s) (acquired), left foot: Secondary | ICD-10-CM

## 2022-07-10 DIAGNOSIS — M2012 Hallux valgus (acquired), left foot: Secondary | ICD-10-CM | POA: Diagnosis not present

## 2022-07-11 NOTE — Progress Notes (Signed)
  Subjective:  Patient ID: Tiffany Berg, female    DOB: [birth year],  MRN: [id number]  Chief Complaint  Patient presents with   Bunions    DOS 02/24/2022// surgery follow up left foot      66 y.o. female returns for post-op check.  She is doing well she has occasional pain at night but overall pleased  Review of Systems: Negative except as noted in the HPI. Denies N/V/F/Ch.   Objective:  There were no vitals filed for this visit. There is no height or weight on file to calculate BMI. Constitutional Well developed. Well nourished.  Vascular Foot warm and well perfused. Capillary refill normal to all digits.  Calf is soft and supple, no posterior calf or knee pain, negative Homans' sign  Neurologic Normal speech. Oriented to person, place, and time. Epicritic sensation to light touch grossly present bilaterally.  Dermatologic Incisions are well-healed and not hypertrophic  Orthopedic: Little to no edema or pain today   Multiple view plain film radiographs: Overall significant improvement in correction since preoperative radiographs, she has good healing across all fusion and osteotomy sites, there is some under correction with residual hallux abductus Assessment:   1. Hallux valgus with bunions, left   2. Hammertoe of left foot     Plan:  Patient was evaluated and treated and all questions answered.  S/p foot surgery left -Overall she is doing well she may continue her regular activity and work at this point.  I have no activity restrictions for her.  She is pleased overall with her progress in her correction.  She will return to see me as needed.   Return if symptoms worsen or fail to improve.

## 2022-07-21 ENCOUNTER — Ambulatory Visit (INDEPENDENT_AMBULATORY_CARE_PROVIDER_SITE_OTHER): Payer: Medicare Other

## 2022-07-21 DIAGNOSIS — I442 Atrioventricular block, complete: Secondary | ICD-10-CM | POA: Diagnosis not present

## 2022-07-21 LAB — CUP PACEART REMOTE DEVICE CHECK
Battery Remaining Longevity: 102 mo
Battery Voltage: 3 V
Brady Statistic AP VP Percent: 30.29 %
Brady Statistic AP VS Percent: 1.51 %
Brady Statistic AS VP Percent: 55.35 %
Brady Statistic AS VS Percent: 12.86 %
Brady Statistic RA Percent Paced: 31.8 %
Brady Statistic RV Percent Paced: 85.64 %
Date Time Interrogation Session: 20231116221157
Implantable Lead Connection Status: 753985
Implantable Lead Connection Status: 753985
Implantable Lead Implant Date: 20210711
Implantable Lead Implant Date: 20210711
Implantable Lead Location: 753859
Implantable Lead Location: 753860
Implantable Lead Model: 5076
Implantable Lead Model: 5076
Implantable Pulse Generator Implant Date: 20210711
Lead Channel Impedance Value: 323 Ohm
Lead Channel Impedance Value: 342 Ohm
Lead Channel Impedance Value: 380 Ohm
Lead Channel Impedance Value: 456 Ohm
Lead Channel Pacing Threshold Amplitude: 0.75 V
Lead Channel Pacing Threshold Amplitude: 0.75 V
Lead Channel Pacing Threshold Pulse Width: 0.4 ms
Lead Channel Pacing Threshold Pulse Width: 0.4 ms
Lead Channel Sensing Intrinsic Amplitude: 3.375 mV
Lead Channel Sensing Intrinsic Amplitude: 3.375 mV
Lead Channel Sensing Intrinsic Amplitude: 4.625 mV
Lead Channel Sensing Intrinsic Amplitude: 4.625 mV
Lead Channel Setting Pacing Amplitude: 1.5 V
Lead Channel Setting Pacing Amplitude: 2.5 V
Lead Channel Setting Pacing Pulse Width: 0.4 ms
Lead Channel Setting Sensing Sensitivity: 1.2 mV
Zone Setting Status: 755011
Zone Setting Status: 755011

## 2022-07-28 ENCOUNTER — Other Ambulatory Visit: Payer: Self-pay | Admitting: Family Medicine

## 2022-07-28 DIAGNOSIS — E039 Hypothyroidism, unspecified: Secondary | ICD-10-CM

## 2022-08-08 NOTE — Progress Notes (Signed)
Remote pacemaker transmission.   

## 2022-08-29 ENCOUNTER — Telehealth: Payer: Self-pay

## 2022-08-29 NOTE — Telephone Encounter (Signed)
-  Patient is having sore throat, cough and fever. Caller states she wanting to schedule an apt. Pt states her throat is very painful and having difficulty swallowing solid foods  08/25/2022 10:19:28 AM Go to ED Now (or PCP triage) Tito Dine, RN, [facility]  08/29/22 1128 - Pt states she didn't go to UC b/c she doesn't know how to use GPS & doesn't know her way around [place]. Pt states she is still congested with sore throat & body aches. States symptoms started x 5 days ago. Denies fever or SOB at this time.  Pt states that her daughter could bring her, but she has many kids & it makes it hard for her to coordinate transportation with her. Pt states the only place for healthcare that she knows how to get to is her PCP's office. Appt scheduled with PCP for 08/30/22 at 1630. Pt advised that if fever or SOB develop before appt to go to ED. Pt verb understanding & denies further ques/concerns at this time.

## 2022-08-29 NOTE — Progress Notes (Signed)
ACUTE VISIT No chief complaint on file.  HPI: Tiffany Berg is a 66 y.o. female, who is here today complaining of *** HPI  Review of Systems See other pertinent positives and negatives in HPI.  Current Outpatient Medications on File Prior to Visit  Medication Sig Dispense Refill   B Complex Vitamins (VITAMIN B COMPLEX PO) Take by mouth.     benzonatate (TESSALON) 100 MG capsule Take 1 capsule (100 mg total) by mouth 2 (two) times daily as needed for cough. 20 capsule 0   fluticasone (FLONASE) 50 MCG/ACT nasal spray Place 1 spray into both nostrils 2 (two) times daily. 16 g 0   gabapentin (NEURONTIN) 300 MG capsule Take 1 capsule (300 mg total) by mouth 3 (three) times daily for 7 days. 21 capsule 0   levothyroxine (SYNTHROID) 75 MCG tablet TAKE 1 TABLET BY MOUTH ONCE DAILY BEFORE BREAKFAST 90 tablet 0   lovastatin (MEVACOR) 40 MG tablet TAKE 2 TABLETS BY MOUTH AT BEDTIME (Patient taking differently: Take 80 mg by mouth at bedtime.) 180 tablet 1   oxyCODONE-acetaminophen (PERCOCET) 5-325 MG tablet Take 1 tablet by mouth every 4 (four) hours as needed for severe pain. (Patient not taking: Reported on 05/15/2022) 30 tablet 0   tiZANidine (ZANAFLEX) 2 MG tablet Take 1-2 tablets (2-4 mg total) by mouth every 6 (six) hours as needed for muscle spasms. (Patient not taking: Reported on 05/15/2022) 60 tablet 1   No current facility-administered medications on file prior to visit.    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 Faythe Ghee ([facility]);  dx 10/ 2014  DCIS left breast , ER/PR+;  07-22-2013 s/p  left breast partial mastectomy w/ sln dissection's and  08-12-2013 s/p left breast lumpecotmy  (excision another site);  completed radiation 02/ 2015   Headache    Hyperlipidemia    Hypothyroidism    Insomnia    Mobitz type 2 second degree heart block 03/12/2020   w/ symptomatic bradycardia, rate 30s;  s/p  PPM 03-14-2020   Myalgia, multiple sites    Pre-diabetes    Symptomatic bradycardia 03/12/2020   s/p PPM   Wears glasses    Wears partial dentures    upper and lower   No Known Allergies  Social History   Socioeconomic History   Marital status: Legally Separated    Spouse name: Not on file   Number of children: Not on file   Years of education: Not on file   Highest education level: Not on file  Occupational History   Not on file  Tobacco Use   Smoking status: Never   Smokeless tobacco: Never  Vaping Use   Vaping Use: Never used  Substance and Sexual Activity   Alcohol use: Yes    Comment: socially   Drug use: Never   Sexual activity: Yes    Birth control/protection: Post-menopausal, Surgical  Other Topics Concern   Not on file  Social History Narrative   Not on file   Social Determinants of Health   Financial Resource Strain: Low Risk  (02/22/2022)   Overall Financial Resource Strain (CARDIA)    Difficulty of Paying Living Expenses: Not hard at all  Food Insecurity:  No Food Insecurity (02/22/2022)   Hunger Vital Sign    Worried About Running Out of Food in the Last Year: Never true    Ran Out of Food in the Last Year: Never true  Transportation Needs: No Transportation Needs (02/22/2022)   PRAPARE - Hydrologist (Medical): No    Lack of Transportation (Non-Medical): No  Physical Activity: Insufficiently Active (02/22/2022)   Exercise Vital Sign    Days of Exercise per Week: 2 days    Minutes of Exercise per Session: 60 min  Stress: No Stress Concern Present (02/22/2022)   [place]    Feeling of Stress : Not at all  Social Connections: Unknown (02/22/2022)    Social Connection and Isolation Panel [NHANES]    Frequency of Communication with Friends and Family: More than three times a week    Frequency of Social Gatherings with Friends and Family: More than three times a week    Attends Religious Services: Not on file    Active Member of Clubs or Organizations: Yes    Attends Archivist Meetings: More than 4 times per year    Marital Status: Separated    There were no vitals filed for this visit. There is no height or weight on file to calculate BMI.  Physical Exam  ASSESSMENT AND PLAN: There are no diagnoses linked to this encounter.  No follow-ups on file.  Saman Umstead G. Martinique, MD  [facility]. [place] office.  Discharge Instructions   None

## 2022-08-30 ENCOUNTER — Encounter: Payer: Self-pay | Admitting: Family Medicine

## 2022-08-30 ENCOUNTER — Ambulatory Visit (INDEPENDENT_AMBULATORY_CARE_PROVIDER_SITE_OTHER): Payer: Medicare Other | Admitting: Family Medicine

## 2022-08-30 VITALS — BP 110/70 | HR 76 | Temp 98.0°F | Resp 16 | Ht 62.0 in | Wt 154.5 lb

## 2022-08-30 DIAGNOSIS — J029 Acute pharyngitis, unspecified: Secondary | ICD-10-CM | POA: Diagnosis not present

## 2022-08-30 DIAGNOSIS — R0602 Shortness of breath: Secondary | ICD-10-CM | POA: Diagnosis not present

## 2022-08-30 DIAGNOSIS — J069 Acute upper respiratory infection, unspecified: Secondary | ICD-10-CM | POA: Diagnosis not present

## 2022-08-30 LAB — POCT RAPID STREP A (OFFICE): Rapid Strep A Screen: NEGATIVE

## 2022-08-30 LAB — POC COVID19 BINAXNOW: SARS Coronavirus 2 Ag: NEGATIVE

## 2022-08-30 MED ORDER — MAGIC MOUTHWASH W/LIDOCAINE: 5.0000 mL | Freq: Three times a day (TID) | ORAL | 0 refills | Status: AC | PRN

## 2022-08-30 NOTE — Patient Instructions (Addendum)
A few things to remember from today's visit:  Sore throat - Plan: magic mouthwash w/lidocaine SOLN  URI, acute  SOB (shortness of breath) - Plan: DG Chest 2 View [employer] pulmones suenan normales, pero usted esta reportando dificultad para respiras, entonces yo recomiendo una radiografa de los pulmones. Si empieza con dolor en el pecho o el corazn se siente rpido o la dificultad para respirar empeora se va para urgencias. Parece una infeccin viral, usualmente se resuelve sola. Tratamiento para los sntomas es recomendado. Tmese la temperatura si siente que tiene [place].  Do not use My Chart to request refills or for acute issues that need immediate attention. If you send a my chart message, it may take a few days to be addressed, specially if I am not in the office.

## 2022-10-17 NOTE — Progress Notes (Unsigned)
ACUTE VISIT No chief complaint on file.  HPI: Tiffany Berg is a 67 y.o. female, who is here today complaining of *** HPI  Review of Systems See other pertinent positives and negatives in HPI.  Current Outpatient Medications on File Prior to Visit  Medication Sig Dispense Refill   B Complex Vitamins (VITAMIN B COMPLEX PO) Take by mouth.     fluticasone (FLONASE) 50 MCG/ACT nasal spray Place 1 spray into both nostrils 2 (two) times daily. 16 g 0   gabapentin (NEURONTIN) 300 MG capsule Take 1 capsule (300 mg total) by mouth 3 (three) times daily for 7 days. 21 capsule 0   levothyroxine (SYNTHROID) 75 MCG tablet TAKE 1 TABLET BY MOUTH ONCE DAILY BEFORE BREAKFAST 90 tablet 0   lovastatin (MEVACOR) 40 MG tablet TAKE 2 TABLETS BY MOUTH AT BEDTIME (Patient taking differently: Take 80 mg by mouth at bedtime.) 180 tablet 1   oxyCODONE-acetaminophen (PERCOCET) 5-325 MG tablet Take 1 tablet by mouth every 4 (four) hours as needed for severe pain. 30 tablet 0   tiZANidine (ZANAFLEX) 2 MG tablet Take 1-2 tablets (2-4 mg total) by mouth every 6 (six) hours as needed for muscle spasms. 60 tablet 1   No current facility-administered medications on file prior to visit.    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   w/ symptomatic bradycardia, rate 30s;  s/p  PPM  03-14-2020   Myalgia, multiple sites    Pre-diabetes    Symptomatic bradycardia 03/12/2020   s/p PPM   Wears glasses    Wears partial dentures    upper and lower   No Known Allergies  Social History   Socioeconomic History   Marital status: Legally Separated    Spouse name: Not on file   Number of children: Not on file   Years of education: Not on file   Highest education level: Not on file  Occupational History   Not on file  Tobacco Use   Smoking status: Never   Smokeless tobacco: Never  Vaping Use   Vaping Use: Never used  Substance and Sexual Activity   Alcohol use: Yes    Comment: socially   Drug use: Never   Sexual activity: Yes    Birth control/protection: Post-menopausal, Surgical  Other Topics Concern   Not on file  Social History Narrative   Not on file   Social Determinants of Health   Financial Resource Strain: Low Risk  (02/22/2022)   Overall Financial Resource Strain (CARDIA)    Difficulty of Paying Living Expenses: Not hard at all  Food Insecurity: No Food Insecurity (02/22/2022)   Hunger Vital Sign    Worried About Running Out of Food in the Last Year: Never true    Ran Out of Food in the Last Year: Never true  Transportation Needs: No Transportation Needs (02/22/2022)   PRAPARE - Hydrologist (Medical): No    Lack of Transportation (Non-Medical): No  Physical Activity: Insufficiently Active (02/22/2022)   Exercise Vital Sign    Days of Exercise per Week: 2 days    Minutes of Exercise per Session: 60 min  Stress: No Stress Concern Present (02/22/2022)   [place]    Feeling of Stress : Not at all  Social Connections: Unknown (02/22/2022)   Social Connection and Isolation Panel [NHANES]    Frequency of Communication with Friends and Family: More than three times a week    Frequency of Social Gatherings with Friends and Family: More than three times a week     Attends Religious Services: Not on file    Active Member of Clubs or Organizations: Yes    Attends Archivist Meetings: More than 4 times per year    Marital Status: Separated    There were no vitals filed for this visit. There is no height or weight on file to calculate BMI.  Physical Exam  ASSESSMENT AND PLAN: There are no diagnoses linked to this encounter.  No follow-ups on file.  Alberta Lenhard G. Martinique, MD  [facility]. [place] office.  Discharge Instructions   None

## 2022-10-18 ENCOUNTER — Encounter: Payer: Self-pay | Admitting: Family Medicine

## 2022-10-18 ENCOUNTER — Ambulatory Visit (INDEPENDENT_AMBULATORY_CARE_PROVIDER_SITE_OTHER): Payer: 59 | Admitting: Family Medicine

## 2022-10-18 VITALS — BP 128/80 | HR 100 | Temp 99.1°F | Resp 16 | Ht 62.0 in | Wt 155.5 lb

## 2022-10-18 DIAGNOSIS — J069 Acute upper respiratory infection, unspecified: Secondary | ICD-10-CM

## 2022-10-18 DIAGNOSIS — M542 Cervicalgia: Secondary | ICD-10-CM

## 2022-10-18 DIAGNOSIS — M25511 Pain in right shoulder: Secondary | ICD-10-CM

## 2022-10-18 LAB — POC COVID19 BINAXNOW: SARS Coronavirus 2 Ag: NEGATIVE

## 2022-10-18 LAB — POCT INFLUENZA A/B
Influenza A, POC: NEGATIVE
Influenza B, POC: NEGATIVE

## 2022-10-18 MED ORDER — TRAMADOL HCL 50 MG PO TABS: 50.0000 mg | ORAL_TABLET | Freq: Two times a day (BID) | ORAL | 0 refills | Status: DC | PRN

## 2022-10-18 NOTE — Patient Instructions (Addendum)
A few things to remember from today's visit:  URI, acute  Acute pain of right shoulder - Plan: traMADol (ULTRAM) 50 MG tablet, Ambulatory referral to Physical Therapy  Neck pain on right side - Plan: traMADol (ULTRAM) 50 MG tablet Hoy hicimos COVID y flu test, los 2 negativos. maana repita COVID test. acetaminofn 500 mg 3-4 veces at da si lo necesita. Ibuprofeno con cautela porque puede elevar la presin arteria. Tramadol especialmente en la noche.   Do not use My Chart to request refills or for acute issues that need immediate attention. If you send a my chart message, it may take a few days to be addressed, specially if I am not in the office.  Please be sure medication list is accurate. If a new problem present, please set up appointment sooner than planned today.

## 2022-10-20 ENCOUNTER — Ambulatory Visit (INDEPENDENT_AMBULATORY_CARE_PROVIDER_SITE_OTHER): Payer: 59

## 2022-10-20 DIAGNOSIS — I442 Atrioventricular block, complete: Secondary | ICD-10-CM

## 2022-10-20 LAB — CUP PACEART REMOTE DEVICE CHECK
Battery Remaining Longevity: 100 mo
Battery Voltage: 3 V
Brady Statistic AP VP Percent: 28.68 %
Brady Statistic AP VS Percent: 1.09 %
Brady Statistic AS VP Percent: 58.41 %
Brady Statistic AS VS Percent: 11.81 %
Brady Statistic RA Percent Paced: 29.77 %
Brady Statistic RV Percent Paced: 87.09 %
Date Time Interrogation Session: 20240215215452
Implantable Lead Connection Status: 753985
Implantable Lead Connection Status: 753985
Implantable Lead Implant Date: 20210711
Implantable Lead Implant Date: 20210711
Implantable Lead Location: 753859
Implantable Lead Location: 753860
Implantable Lead Model: 5076
Implantable Lead Model: 5076
Implantable Pulse Generator Implant Date: 20210711
Lead Channel Impedance Value: 342 Ohm
Lead Channel Impedance Value: 361 Ohm
Lead Channel Impedance Value: 380 Ohm
Lead Channel Impedance Value: 551 Ohm
Lead Channel Pacing Threshold Amplitude: 0.75 V
Lead Channel Pacing Threshold Amplitude: 0.75 V
Lead Channel Pacing Threshold Pulse Width: 0.4 ms
Lead Channel Pacing Threshold Pulse Width: 0.4 ms
Lead Channel Sensing Intrinsic Amplitude: 3.625 mV
Lead Channel Sensing Intrinsic Amplitude: 3.625 mV
Lead Channel Sensing Intrinsic Amplitude: 5.125 mV
Lead Channel Sensing Intrinsic Amplitude: 5.125 mV
Lead Channel Setting Pacing Amplitude: 1.5 V
Lead Channel Setting Pacing Amplitude: 2.5 V
Lead Channel Setting Pacing Pulse Width: 0.4 ms
Lead Channel Setting Sensing Sensitivity: 1.2 mV
Zone Setting Status: 755011
Zone Setting Status: 755011

## 2022-10-23 ENCOUNTER — Encounter: Payer: Self-pay | Admitting: Family Medicine

## 2022-10-23 ENCOUNTER — Ambulatory Visit (INDEPENDENT_AMBULATORY_CARE_PROVIDER_SITE_OTHER): Payer: 59 | Admitting: Family Medicine

## 2022-10-23 VITALS — BP 136/82 | HR 73 | Temp 97.9°F | Resp 16 | Ht 62.0 in | Wt 152.2 lb

## 2022-10-23 DIAGNOSIS — I700 Atherosclerosis of aorta: Secondary | ICD-10-CM | POA: Diagnosis not present

## 2022-10-23 DIAGNOSIS — R110 Nausea: Secondary | ICD-10-CM | POA: Diagnosis not present

## 2022-10-23 DIAGNOSIS — I442 Atrioventricular block, complete: Secondary | ICD-10-CM | POA: Diagnosis not present

## 2022-10-23 DIAGNOSIS — R42 Dizziness and giddiness: Secondary | ICD-10-CM | POA: Diagnosis not present

## 2022-10-23 DIAGNOSIS — R1013 Epigastric pain: Secondary | ICD-10-CM | POA: Diagnosis not present

## 2022-10-23 DIAGNOSIS — E039 Hypothyroidism, unspecified: Secondary | ICD-10-CM | POA: Diagnosis not present

## 2022-10-23 MED ORDER — OMEPRAZOLE 40 MG PO CPDR: 40.0000 mg | DELAYED_RELEASE_CAPSULE | Freq: Every day | ORAL | 0 refills | Status: DC

## 2022-10-23 MED ORDER — LEVOTHYROXINE SODIUM 75 MCG PO TABS: 75.0000 ug | ORAL_TABLET | Freq: Every day | ORAL | 2 refills | Status: DC

## 2022-10-23 MED ORDER — ONDANSETRON HCL 4 MG PO TABS: 4.0000 mg | ORAL_TABLET | Freq: Two times a day (BID) | ORAL | 0 refills | Status: AC | PRN

## 2022-10-23 NOTE — Progress Notes (Unsigned)
ACUTE VISIT Chief Complaint  Patient presents with   Follow-up   HPI: [name] is a 67 y.o. female, who is here today complaining of dizziness. She was seen on 10/18/22 , when she was c/o severe right-sided neck and RUE pain. Tramadol 50 mg bid prn was prescribed. She reports that the dizziness began after taking Tramadol.  Dizziness This is a new problem. The problem has been gradually improving. Associated symptoms include fatigue and nausea. Pertinent negatives include no abdominal pain, change in bowel habit, coughing, diaphoresis, fever, headaches, neck pain, numbness, sore throat, vomiting or weakness.   She states that the neck pain has resolved and the shoulder pain has significantly improved.   She describes the dizziness as lightheadedness and imbalance, which is worse when standing up. She also reports experiencing nausea and vomiting x 1 after taking Tramadol.   She mentions feeling a little pressure in her chest, in the middle lower part, and mild heartburn. No associated palpitations, SOB,or diaphoresis. Denies exertional CP. Complete AV block, s/p pacemaker placement. She follows with cardiologist, last visit 12/08/21.  Aortic atherosclerosis seen on imaging. She is on Lovastatin 40 mg daily. Lab Results  Component Value Date   CHOL 232 (H) 10/14/2020   HDL 52.30 10/14/2020   LDLCALC 160 (H) 10/14/2020   TRIG 101.0 10/14/2020   CHOLHDL 4 10/14/2020   Hypothyroidism, currently taking Levothyroxine 75 mcg daily. She needs refills. Negative for fever,abnormal wt loss,or night sweats.  Lab Results  Component Value Date   TSH 4.83 02/06/2022   Review of Systems  Constitutional:  Positive for fatigue. Negative for diaphoresis and fever.  HENT:  Negative for sore throat.   Respiratory:  Negative for cough and wheezing.   Gastrointestinal:  Positive for nausea. Negative for abdominal pain, change in bowel habit and vomiting.  Genitourinary:  Negative for  decreased urine volume, dysuria and hematuria.  Musculoskeletal:  Negative for neck pain.  Neurological:  Positive for dizziness. Negative for syncope, weakness, numbness and headaches.  See other pertinent positives and negatives in HPI.  Current Outpatient Medications on File Prior to Visit  Medication Sig Dispense Refill   B Complex Vitamins (VITAMIN B COMPLEX PO) Take by mouth.     lovastatin (MEVACOR) 40 MG tablet TAKE 2 TABLETS BY MOUTH AT BEDTIME (Patient taking differently: Take 80 mg by mouth at bedtime.) 180 tablet 1   tiZANidine (ZANAFLEX) 2 MG tablet Take 1-2 tablets (2-4 mg total) by mouth every 6 (six) hours as needed for muscle spasms. 60 tablet 1   fluticasone (FLONASE) 50 MCG/ACT nasal spray Place 1 spray into both nostrils 2 (two) times daily. 16 g 0   gabapentin (NEURONTIN) 300 MG capsule Take 1 capsule (300 mg total) by mouth 3 (three) times daily for 7 days. 21 capsule 0   No current facility-administered medications on file prior to visit.   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 Faythe Ghee ([facility]);  dx 10/ 2014  DCIS left breast , ER/PR+;  07-22-2013 s/p  left breast partial mastectomy w/ sln dissection's and  08-12-2013 s/p left breast lumpecotmy (excision another site);  completed radiation 02/ 2015   Headache    Hyperlipidemia  Hypothyroidism    Insomnia    Mobitz type 2 second degree heart block 03/12/2020   w/ symptomatic bradycardia, rate 30s;  s/p  PPM 03-14-2020   Myalgia, multiple sites    Pre-diabetes    Symptomatic bradycardia 03/12/2020   s/p PPM   Wears glasses    Wears partial dentures    upper and lower   No Known Allergies  Social History    Socioeconomic History   Marital status: Legally Separated    Spouse name: Not on file   Number of children: Not on file   Years of education: Not on file   Highest education level: Not on file  Occupational History   Not on file  Tobacco Use   Smoking status: Never   Smokeless tobacco: Never  Vaping Use   Vaping Use: Never used  Substance and Sexual Activity   Alcohol use: Yes    Comment: socially   Drug use: Never   Sexual activity: Yes    Birth control/protection: Post-menopausal, Surgical  Other Topics Concern   Not on file  Social History Narrative   Not on file   Social Determinants of Health   Financial Resource Strain: Low Risk  (02/22/2022)   Overall Financial Resource Strain (CARDIA)    Difficulty of Paying Living Expenses: Not hard at all  Food Insecurity: No Food Insecurity (02/22/2022)   Hunger Vital Sign    Worried About Running Out of Food in the Last Year: Never true    Ran Out of Food in the Last Year: Never true  Transportation Needs: No Transportation Needs (02/22/2022)   PRAPARE - Hydrologist (Medical): No    Lack of Transportation (Non-Medical): No  Physical Activity: Insufficiently Active (02/22/2022)   Exercise Vital Sign    Days of Exercise per Week: 2 days    Minutes of Exercise per Session: 60 min  Stress: No Stress Concern Present (02/22/2022)   [place]    Feeling of Stress : Not at all  Social Connections: Unknown (02/22/2022)   Social Connection and Isolation Panel [NHANES]    Frequency of Communication with Friends and Family: More than three times a week    Frequency of Social Gatherings with Friends and Family: More than three times a week    Attends Religious Services: Not on file    Active Member of Clubs or Organizations: Yes    Attends Archivist Meetings: More than 4 times per year    Marital Status: Separated   Vitals:    10/23/22 1025  BP: 136/82  Pulse: 73  Resp: 16  Temp: 97.9 F (36.6 C)  SpO2: 98%  Body mass index is 27.85 kg/m.  Physical Exam Vitals and nursing note reviewed.  Constitutional:      General: She is not in acute distress.    Appearance: She is well-developed.  HENT:     Head: Normocephalic and atraumatic.     Mouth/Throat:     Mouth: Mucous membranes are moist.     Pharynx: Oropharynx is clear.  Eyes:     Extraocular Movements: Extraocular movements intact.     Conjunctiva/sclera: Conjunctivae normal.     Pupils: Pupils are equal, round, and reactive to light.  Cardiovascular:     Rate and Rhythm: Normal rate and regular rhythm.     Heart sounds: No murmur heard. Pulmonary:     Effort: Pulmonary effort is normal. No respiratory  distress.     Breath sounds: Normal breath sounds.  Abdominal:     Palpations: Abdomen is soft. There is no hepatomegaly or mass.     Tenderness: There is abdominal tenderness in the epigastric area. There is no guarding or rebound.  Skin:    General: Skin is warm.     Findings: No erythema or rash.  Neurological:     General: No focal deficit present.     Mental Status: She is alert and oriented to person, place, and time.     Cranial Nerves: No cranial nerve deficit.     Gait: Gait normal.  Psychiatric:        Mood and Affect: Affect normal. Mood is anxious.    ASSESSMENT AND PLAN:  Ms. Pion was seen today for dizziness and nausea.  Dyspepsia Symptoms after taking Tramadol. Omeprazole 40 mg daily x 6-8 weeks. GERD precautions.  -     Omeprazole; Take 1 capsule (40 mg total) by mouth daily.  Dispense: 60 capsule; Refill: 0  Nausea without vomiting Small portions a few times through the day and adequate hydration. Zofran recommended. Instructed about warning signs.  -     Ondansetron HCl; Take 1 tablet (4 mg total) by mouth every 12 (twelve) hours as needed for up to 3 days for nausea or vomiting.  Dispense: 6 tablet; Refill:  0  Dizziness We discussed possible etiologies, noted after taking tramadol and improved after discontinuation. Fall precautions discussed. I do not think further work up is needed today. Instructed about warning signs. Note for work provided.  Complete AV block (HCC) Assessment & Plan: S/P pacemaker placement. Follows with cardiologist annually, last seen in 12/2021.  Acquired hypothyroidism Assessment & Plan: Last TSH 4.8 in 02/2022. Continue levothyroxine 75 mcg daily with empty stomach.  Orders: -     Levothyroxine Sodium; Take 1 tablet (75 mcg total) by mouth daily before breakfast.  Dispense: 90 tablet; Refill: 2  Atherosclerosis of aorta [facility]) Assessment & Plan: Seen on CXR in 03/2020. She is on Lovastatin and Aspirin 81 mg daily.  Return if symptoms worsen or fail to improve, for keep next appointment.  Aqua Denslow G. Martinique, MD  [facility]. [place] office.

## 2022-10-23 NOTE — Patient Instructions (Addendum)
A few things to remember from today's visit:  Dyspepsia  Acquired hypothyroidism - Plan: levothyroxine (SYNTHROID) 75 MCG tablet  Nausea without vomiting Los sntomas parecen efectos secundarios del Tramadol. El dolor de estomago puede ser gastritis or irritacin. Omeprazol con estomago vacio, a la hora de la cama por 6-8 semanas. Descanse. Cuidado con cadas.  Do not use My Chart to request refills or for acute issues that need immediate attention. If you send a my chart message, it may take a few days to be addressed, specially if I am not in the office.  Please be sure medication list is accurate. If a new problem present, please set up appointment sooner than planned today.

## 2022-10-26 DIAGNOSIS — I442 Atrioventricular block, complete: Secondary | ICD-10-CM | POA: Insufficient documentation

## 2022-10-26 NOTE — Assessment & Plan Note (Signed)
S/P pacemaker placement. Follows with cardiologist annually, last seen in 12/2021.

## 2022-10-26 NOTE — Assessment & Plan Note (Signed)
Seen on CXR in 03/2020. She is on Lovastatin and Aspirin 81 mg daily.

## 2022-10-26 NOTE — Assessment & Plan Note (Signed)
Last TSH 4.8 in 02/2022. Continue levothyroxine 75 mcg daily with empty stomach.

## 2022-11-07 ENCOUNTER — Other Ambulatory Visit: Payer: Self-pay

## 2022-11-07 ENCOUNTER — Encounter: Payer: Self-pay | Admitting: Physical Therapy

## 2022-11-07 ENCOUNTER — Ambulatory Visit: Payer: 59 | Attending: Family Medicine | Admitting: Physical Therapy

## 2022-11-07 DIAGNOSIS — M6281 Muscle weakness (generalized): Secondary | ICD-10-CM | POA: Insufficient documentation

## 2022-11-07 DIAGNOSIS — G8929 Other chronic pain: Secondary | ICD-10-CM | POA: Diagnosis not present

## 2022-11-07 DIAGNOSIS — M542 Cervicalgia: Secondary | ICD-10-CM | POA: Diagnosis not present

## 2022-11-07 DIAGNOSIS — M25511 Pain in right shoulder: Secondary | ICD-10-CM | POA: Insufficient documentation

## 2022-11-07 NOTE — Therapy (Signed)
OUTPATIENT PHYSICAL THERAPY EVALUATION   Patient Name: Tiffany Berg MRN: [id number] DOB:[birth year], 67 y.o., female Today's Date: 11/07/2022   END OF SESSION:  PT End of Session - 11/07/22 1610     Visit Number 1    Number of Visits 9    Date for PT Re-Evaluation 01/02/23    Authorization Type UHC / MCD    Progress Note Due on Visit 10    PT Start Time [id number]    PT Stop Time 1640    PT Time Calculation (min) 50 min    Activity Tolerance Patient limited by pain    Behavior During Therapy [facility] for tasks assessed/performed             Past Medical History:  Diagnosis Date   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   w/ symptomatic bradycardia, rate 30s;  s/p  PPM 03-14-2020   Myalgia, multiple sites    Pre-diabetes    Symptomatic bradycardia 03/12/2020   s/p PPM   Wears glasses    Wears partial dentures    upper and lower   Past Surgical History:  Procedure Laterality Date   Barbie Banner OSTEOTOMY Right 10/15/2020   Procedure: Barbie Banner OSTEOTOMY;  Surgeon: Criselda Peaches, DPM;  Location: [place];  Service: Podiatry;  Laterality: Right;  REGIONAL BLOCK   AIKEN OSTEOTOMY Left 02/24/2022   Procedure: Barbie Banner OSTEOTOMY;  Surgeon: Criselda Peaches, DPM;  Location: [place];  Service: Podiatry;  Laterality: Left;   ANTERIOR AND POSTERIOR REPAIR N/A 02/18/2015    Procedure: ANTERIOR (CYSTOCELE) AND POSTERIOR REPAIR (RECTOCELE);  Surgeon: Everett Graff, MD;  Location: [facility];  Service: Gynecology;  Laterality: N/A;   BLADDER SUSPENSION N/A 02/18/2015   Procedure: TRANSVAGINAL TAPE (TVT) PROCEDURE;  Surgeon: Everett Graff, MD;  Location: [facility];  Service: Gynecology;  Laterality: N/A;   CYSTOSCOPY Bilateral 02/18/2015   Procedure: CYSTOSCOPY;  Surgeon: Everett Graff, MD;  Location: [facility];  Service: Gynecology;  Laterality: Bilateral;   GRAFT APPLICATION Left [id number]   Procedure: BONE GRAFT;  Surgeon: Criselda Peaches, DPM;  Location: [place];  Service: Podiatry;  Laterality: Left;   HALLUX VALGUS LAPIDUS Right 10/15/2020   Procedure: HALLUX VALGUS LAPIDUS AND BONE GRAFT;  Surgeon: Criselda Peaches, DPM;  Location: [place];  Service: Podiatry;  Laterality: Right;  WITH IV SEDATION; REGIONAL BLOCK   HALLUX VALGUS LAPIDUS Left 02/24/2022   Procedure: HALLUX VALGUS LAPIDUS;  Surgeon: Criselda Peaches, DPM;  Location: [place];  Service: Podiatry;  Laterality: Left;   HAMMER TOE SURGERY Right 10/15/2020   Procedure: HAMMER TOE CORRECTION;  Surgeon: Criselda Peaches, DPM;  Location: [place];  Service: Podiatry;  Laterality: Right;  REGIONAL BLOCK   HAMMER TOE SURGERY Left 02/24/2022   Procedure: HAMMER TOE CORRECTION SECOND  TOE LEFT FOOT;  Surgeon: Criselda Peaches, DPM;  Location: [facility];  Service: Podiatry;  Laterality: Left;   LAPAROSCOPIC ASSISTED VAGINAL HYSTERECTOMY N/A 02/18/2015   Procedure: LAPAROSCOPIC ASSISTED VAGINAL HYSTERECTOMY;  Surgeon: Everett Graff, MD;  Location: [facility];  Service: Gynecology;  Laterality: N/A;   MASTECTOMY, PARTIAL Left 08-12-2013  '@[facility]'$    excision another site   PACEMAKER IMPLANT N/A 03/14/2020   Procedure: PACEMAKER IMPLANT;  Surgeon: Constance Haw, MD;  Location: [place];  Service: Cardiovascular;  Laterality: N/A;    RADIOACTIVE SEED GUIDED PARTIAL MASTECTOMY/AXILLARY SENTINEL NODE BIOPSY/AXILLARY NODE DISSECTION Left 07-22-2013  '@[facility]'$    SALPINGOOPHORECTOMY Bilateral 02/18/2015   Procedure: BILATERAL SALPINGO OOPHORECTOMY;  Surgeon: Everett Graff, MD;  Location: [facility];  Service: Gynecology;  Laterality: Bilateral;   Patient Active Problem List   Diagnosis Date Noted   Complete AV block ([place]) 10/26/2022   Major osseous defect    Prediabetes 10/31/2021   Acquired hypothyroidism 10/31/2021   Acquired hallux interphalangeus, left    Metatarsus primus varus of right foot    Hallux valgus with bunions, left    Hammertoe of second toe of left foot    Atherosclerosis of aorta ([place]) 09/14/2020   Hyperlipidemia 06/07/2020   Symptomatic bradycardia 03/12/2020   Mobitz II 03/12/2020   Osteopenia 12/02/2019   Bilateral low back pain 10/02/2019   Pelvic prolapse 02/18/2015    PCP: Martinique, Betty G, MD  REFERRING PROVIDER: Martinique, Betty G, MD  REFERRING DIAG: Acute pain of right shoulder  THERAPY DIAG:  Chronic right shoulder pain  Cervicalgia  Muscle weakness (generalized)  Rationale for Evaluation and Treatment: Rehabilitation  ONSET DATE: 3 weeks   Utilized video interpreter for translation  SUBJECTIVE:                                                                                                                                                                                     SUBJECTIVE STATEMENT:  Patient reports pain in her right arm and neck, ongoing for about 3 weeks without a specific mechanism. States that is came on all of a sudden. During the day the pain is a small amount of pain, but at night the pain gets stronger and the pain will not let her sleep. The pain will travel from her shoulder down the right arm, and then it will go up in to the side of the neck on the right. She reports whenever she lifts anything she will get more pain, something as simply as lifting the covers at  night. Will take ibuprofen occasionally for the pain, but the medication her doctor gave her was too strong and  made her feel bad. She did use some heat that helps while it is applied, but pain comes back. She denies any numbness or tingling.  Patient is right handed  PERTINENT HISTORY: See PMH above  PAIN:  Are you having pain? Yes:  NPRS scale: 6/10 (at night 9/10) Pain location: Right shoulder, arm, neck Pain description: "Bothersome" Aggravating factors: Nocturnal pain, lifting Relieving factors: Ibuprofen  PRECAUTIONS: None  WEIGHT BEARING RESTRICTIONS: No  FALLS:  Has patient fallen in last 6 months? No  OCCUPATION: Works part-time  PLOF: Independent  PATIENT GOALS: Pain relief, improve sleep and using right arm   OBJECTIVE:  PATIENT SURVEYS:  FOTO 47% functional status  COGNITION: Overall cognitive status: Within functional limits for tasks assessed     SENSATION: WFL  POSTURE: Rounded shoulder, forward head  UPPER EXTREMITY ROM:   Active ROM Right eval Left eval  Shoulder flexion 150 160  Shoulder extension    Shoulder abduction    Shoulder adduction    Shoulder internal rotation L1 T7  Shoulder external rotation T1 T2  Elbow flexion    Elbow extension    Wrist flexion    Wrist extension    Wrist ulnar deviation    Wrist radial deviation    Wrist pronation    Wrist supination    (Blank rows = not tested)  Passive right shoulder motion grossly WFL with pain at end range flexion and IR  UPPER EXTREMITY MMT:  MMT Right eval Left eval  Shoulder flexion 4 5  Shoulder extension    Shoulder abduction 4 5  Shoulder adduction    Shoulder internal rotation    Shoulder external rotation 4 5  Middle trapezius    Lower trapezius    Elbow flexion 5 5  Elbow extension 5 5  Wrist flexion    Wrist extension    Wrist ulnar deviation    Wrist radial deviation    Wrist pronation    Wrist supination    Grip strength (lbs)    (Blank rows = not  tested)  Patient reports pain with all right shoulder motion  SHOULDER SPECIAL TESTS: Impingement tests: Hawkins/Kennedy impingement test: positive   JOINT MOBILITY TESTING:  Not assessed  PALPATION:  Global pain of right shoulder with palpation    TODAY'S TREATMENT:       [facility] Adult PT Treatment:                                                DATE: 11/07/2022 Therapeutic Exercise: Upper trap stretch Seated shoulder blade squeezes Row with yellow ER with yellow  PATIENT EDUCATION: Education details: Exam findings, POC, HEP Person educated: Patient Education method: Consulting civil engineer, Demonstration, Tactile cues, Verbal cues, and Handouts Education comprehension: verbalized understanding, returned demonstration, verbal cues required, tactile cues required, and needs further education  HOME EXERCISE PROGRAM: Access Code: [email or web address]   ASSESSMENT: CLINICAL IMPRESSION: Patient is a 67 y.o. female who was seen today for physical therapy evaluation and treatment for right shoulder, neck, and arm pain. Her symptoms seem consistent with rotator cuff related pain. She demonstrates slight limitations in motion, strength deficits, and postural deviations.  OBJECTIVE IMPAIRMENTS: decreased activity tolerance, decreased ROM, decreased strength, postural dysfunction, and pain.   ACTIVITY LIMITATIONS: carrying, lifting, sleeping, dressing, reach over head, and hygiene/grooming  PARTICIPATION LIMITATIONS: meal prep, cleaning, laundry, shopping, and occupation  PERSONAL FACTORS: Fitness, Past/current experiences, Time since onset of injury/illness/exacerbation, and 3+ comorbidities: see PMH noted above  are also affecting patient's functional outcome.   REHAB POTENTIAL: Good  CLINICAL DECISION MAKING: Stable/uncomplicated  EVALUATION COMPLEXITY: Low   GOALS: Goals reviewed with patient? Yes  SHORT TERM GOALS: Target date: 12/05/2022  Patient will be I with initial HEP in order to progress  with therapy. Baseline: HEP provided at eval Goal status: INITIAL  2.  PT will review FOTO with patient by 3rd visit in order to understand expected progress and outcome with therapy. Baseline: FOTO assessed at eval Goal status: INITIAL  3.  Patient will exhibit right shoulder flexion >/= 160 deg to improve overhead reach without limitation Baseline: 150 deg Goal status: INITIAL  LONG TERM GOALS: Target date: 01/02/2023  Patient will be I with final HEP to maintain progress from PT. Baseline: HEP provided at eval Goal status: INITIAL  2.  Patient will report >/= 64% status on FOTO to indicate improved functional ability. Baseline: 47% functional status Goal status: INITIAL  3.  Patient will exhibit right shoulder strength 5/5 MMT to improve lifting ability Baseline: grossly 4/5 MMT Goal status: INITIAL  4.  Patient will report right shoulder pain </= 4/10 with activity and at night to reduce functional limitations and improve sleeping Baseline: 9/10 pain Goal status: INITIAL  5.  Patient will demonstrate right shoulder IR reach behind back >/= T10 to improve grooming and dressing ability Baseline: reach to L1 Goal status: INITIAL   PLAN: PT FREQUENCY: 1x/week  PT DURATION: 8 weeks  PLANNED INTERVENTIONS: Therapeutic exercises, Therapeutic activity, Neuromuscular re-education, Balance training, Gait training, Patient/Family education, Self Care, Joint mobilization, Aquatic Therapy, Dry Needling, Cryotherapy, Moist heat, Manual therapy, and Re-evaluation  PLAN FOR NEXT SESSION: Review HEP and progress PRN, manual/stretching for right shoulder, progress rotator cuff and periscapular strengthening as tolerated   Hilda Blades, PT, DPT, LAT, ATC 11/07/22  5:06 PM Phone: [phone] Fax: [phone]

## 2022-11-07 NOTE — Patient Instructions (Signed)
Access Code: [email or web address] URL: [email or web address] Date: 11/07/2022 Prepared by: Hilda Blades  Exercises - Seated Gentle Upper Trapezius Stretch  - 2 x daily - 3 reps - 20 seconds hold - Seated Scapular Retraction  - 2 x daily - 10 reps - 5 seconds hold - Standing Row with Anchored Resistance  - 1 x daily - 2 sets - 10 reps - Shoulder External Rotation with Anchored Resistance  - 1 x daily - 2 sets - 10 reps

## 2022-11-13 ENCOUNTER — Encounter (HOSPITAL_COMMUNITY): Payer: Self-pay

## 2022-11-13 ENCOUNTER — Other Ambulatory Visit: Payer: Self-pay

## 2022-11-13 ENCOUNTER — Emergency Department (HOSPITAL_COMMUNITY)
Admission: EM | Admit: 2022-11-13 | Discharge: 2022-11-13 | Disposition: A | Discharge status: Home / Self Care | Payer: No Typology Code available for payment source | Attending: Emergency Medicine | Admitting: Emergency Medicine

## 2022-11-13 ENCOUNTER — Ambulatory Visit: Payer: 59 | Admitting: Internal Medicine

## 2022-11-13 ENCOUNTER — Emergency Department (HOSPITAL_COMMUNITY): Payer: 59

## 2022-11-13 DIAGNOSIS — E039 Hypothyroidism, unspecified: Secondary | ICD-10-CM | POA: Insufficient documentation

## 2022-11-13 DIAGNOSIS — Z79890 Hormone replacement therapy (postmenopausal): Secondary | ICD-10-CM | POA: Insufficient documentation

## 2022-11-13 DIAGNOSIS — S0083XA Contusion of other part of head, initial encounter: Secondary | ICD-10-CM | POA: Insufficient documentation

## 2022-11-13 DIAGNOSIS — S0993XA Unspecified injury of face, initial encounter: Secondary | ICD-10-CM | POA: Diagnosis present

## 2022-11-13 DIAGNOSIS — Y990 Civilian activity done for income or pay: Secondary | ICD-10-CM | POA: Diagnosis not present

## 2022-11-13 DIAGNOSIS — S300XXA Contusion of lower back and pelvis, initial encounter: Secondary | ICD-10-CM | POA: Diagnosis not present

## 2022-11-13 DIAGNOSIS — R040 Epistaxis: Secondary | ICD-10-CM | POA: Insufficient documentation

## 2022-11-13 DIAGNOSIS — W208XXA Other cause of strike by thrown, projected or falling object, initial encounter: Secondary | ICD-10-CM | POA: Insufficient documentation

## 2022-11-13 HISTORY — DX: Unspecified osteoarthritis, unspecified site: M19.90

## 2022-11-13 MED ORDER — OXYMETAZOLINE HCL 0.05 % NA SOLN
2.0000 | Freq: Once | NASAL | Status: AC
  Administered 2022-11-13: 2 via NASAL
  Filled 2022-11-13: qty 30

## 2022-11-13 MED ORDER — KETOROLAC TROMETHAMINE 15 MG/ML IJ SOLN
15.0000 mg | Freq: Once | INTRAMUSCULAR | Status: AC
  Administered 2022-11-13: 15 mg via INTRAMUSCULAR
  Filled 2022-11-13: qty 1

## 2022-11-13 MED ORDER — DEXAMETHASONE 4 MG PO TABS
4.0000 mg | ORAL_TABLET | Freq: Once | ORAL | Status: AC
  Administered 2022-11-13: 4 mg via ORAL
  Filled 2022-11-13: qty 1

## 2022-11-13 MED ORDER — OXYMETAZOLINE HCL 0.05 % NA SOLN: 1.0000 | Freq: Two times a day (BID) | NASAL | 0 refills | Status: AC

## 2022-11-13 MED ORDER — METOCLOPRAMIDE HCL 10 MG PO TABS
10.0000 mg | ORAL_TABLET | Freq: Once | ORAL | Status: AC
  Administered 2022-11-13: 10 mg via ORAL
  Filled 2022-11-13: qty 1

## 2022-11-13 NOTE — ED Provider Notes (Signed)
Patient was a handoff from [employer], PA-C. Pending CT results. Physical Exam  BP 137/67 (BP Location: Right Arm)   Pulse 71   Temp 97.9 F (36.6 C) (Oral)   Resp 14   Ht '5\' 3"'$  (1.6 m)   Wt 70 kg   SpO2 98%   BMI 27.34 kg/m   Physical Exam  Procedures  Procedures  ED Course / MDM    Medical Decision Making Amount and/or Complexity of Data Reviewed Radiology: ordered.  Risk OTC drugs. Prescription drug management.   Patient was a handoff from [employer], PA-C. Please see her note for full HPI. CT results were largely normal with some lower lumbar facet arthropathy. Informed patient of results and to manage symptoms at home with over the counter pain medications. Patient agreeable with treatment plan and verbalized understanding return precautions. All questions answered prior to patient discharge.       Luvenia Heller, PA-C 11/13/22 1641    Fransico Meadow, MD 11/13/22 1725

## 2022-11-13 NOTE — ED Provider Notes (Signed)
[facility] Provider Note   CSN: [id number] Arrival date & time: 11/13/22  1225     History  Chief Complaint  Patient presents with   Epistaxis    Tiffany Berg is a 67 y.o. female with past medical history hypothyroidism, hyperlipidemia who presents to the ED complaining of facial injury 3 days ago.  Family member at bedside agreeable to translate and patient is okay with this.  Patient states that she was at work when the top of an ice machine fell down hitting her in the face knocking her to the ground when she briefly lost consciousness for a few seconds.  She notes that she landed on her buttocks and lower back and did not hit the back of her head after falling down.  She reports that since that time she has had persistent headaches as well as intermittent epistaxis from the right side.  She complains of some lower back pain in addition to to the above symptoms but denies bowel or bladder dysfunction, fever, chills, nausea, vomiting, vision changes, chest pain, shortness of breath, urinary symptoms, or weakness on one side of her body or another.  Patient denies prior head injury.  She has not taken any medications at home for headache or nosebleeds.      Home Medications Prior to Admission medications   Medication Sig Start Date End Date Taking? Authorizing Provider  oxymetazoline (AFRIN NASAL SPRAY) 0.05 % nasal spray Place 1 spray into both nostrils 2 (two) times daily for 3 days. 11/13/22 11/16/22 Yes Maicee Ullman L, PA-C  B Complex Vitamins (VITAMIN B COMPLEX PO) Take by mouth.    [provider]  fluticasone (FLONASE) 50 MCG/ACT nasal spray Place 1 spray into both nostrils 2 (two) times daily. 05/24/21 06/23/21  Martinique, Betty G, MD  gabapentin (NEURONTIN) 300 MG capsule Take 1 capsule (300 mg total) by mouth 3 (three) times daily for 7 days. 02/24/22 03/03/22  Criselda Peaches, DPM  levothyroxine (SYNTHROID) 75 MCG tablet  Take 1 tablet (75 mcg total) by mouth daily before breakfast. 10/23/22   Martinique, Betty G, MD  lovastatin (MEVACOR) 40 MG tablet TAKE 2 TABLETS BY MOUTH AT BEDTIME Patient taking differently: Take 80 mg by mouth at bedtime. 05/27/21   Martinique, Betty G, MD  omeprazole (PRILOSEC) 40 MG capsule Take 1 capsule (40 mg total) by mouth daily. 10/23/22 12/22/22  Martin[place], Betty G, MD  tiZANidine (ZANAFLEX) 2 MG tablet Take 1-2 tablets (2-4 mg total) by mouth every 6 (six) hours as needed for muscle spasms. 11/12/19   Hilts, Legrand Como, MD      Allergies    Patient has no known allergies.    Review of Systems   Review of Systems  All other systems reviewed and are negative.   Physical Exam Updated Vital Signs BP (!) 150/82   Pulse 96   Temp 98.2 F (36.8 C) (Oral)   Resp 16   Ht '5\' 3"'$  (1.6 m)   Wt 70 kg   SpO2 99%   BMI 27.34 kg/m  Physical Exam Vitals and nursing note reviewed.  Constitutional:      General: She is not in acute distress.    Appearance: Normal appearance.  HENT:     Head: Normocephalic.     Comments: Right-sided periorbital ecchymosis, no open wounds    Right Ear: External ear normal.     Left Ear: External ear normal.     Nose:  Comments: Dried blood to the right nares but no localizable active source of bleeding, diffuse tenderness to the nose but no obvious deformity    Mouth/Throat:     Mouth: Mucous membranes are moist.     Pharynx: Oropharynx is clear. No posterior oropharyngeal erythema.     Comments: Dentition appears grossly intact Eyes:     General: No scleral icterus.    Extraocular Movements: Extraocular movements intact.     Conjunctiva/sclera: Conjunctivae normal.     Pupils: Pupils are equal, round, and reactive to light.  Neck:     Comments: No midline cervical spine tenderness, step-offs, or deformities, no overlying skin changes Cardiovascular:     Rate and Rhythm: Normal rate and regular rhythm.     Heart sounds: No murmur heard. Pulmonary:      Effort: Pulmonary effort is normal.     Breath sounds: Normal breath sounds.  Abdominal:     General: Abdomen is flat.     Palpations: Abdomen is soft.     Tenderness: There is no abdominal tenderness.  Musculoskeletal:        General: Normal range of motion.     Cervical back: Normal range of motion and neck supple. No rigidity or tenderness.     Right lower leg: No edema.     Left lower leg: No edema.     Comments: Tenderness to the midline just above buttocks, otherwise no midline thoracic or lumbar spinal tenderness, stepoffs, or deformities  Skin:    General: Skin is warm and dry.     Capillary Refill: Capillary refill takes less than 2 seconds.  Neurological:     General: No focal deficit present.     Mental Status: She is alert and oriented to person, place, and time.     GCS: GCS eye subscore is 4. GCS verbal subscore is 5. GCS motor subscore is 6.     Cranial Nerves: Cranial nerves 2-12 are intact.     Sensory: Sensation is intact.     Motor: Motor function is intact. No weakness, atrophy, abnormal muscle tone or seizure activity.     Coordination: Coordination is intact.     Gait: Gait is intact.  Psychiatric:        Mood and Affect: Mood normal.        Behavior: Behavior normal.     ED Results / Procedures / Treatments   Labs (all labs ordered are listed, but only abnormal results are displayed) Labs Reviewed - No data to display  EKG None  Radiology No results found.  Procedures Procedures    Medications Ordered in ED Medications  oxymetazoline (AFRIN) 0.05 % nasal spray 2 spray (2 sprays Right Nare Given 11/13/22 1344)  ketorolac (TORADOL) 15 MG/ML injection 15 mg (15 mg Intramuscular Given 11/13/22 1343)  metoCLOPramide (REGLAN) tablet 10 mg (10 mg Oral Given 11/13/22 1344)  dexamethasone (DECADRON) tablet 4 mg (4 mg Oral Given 11/13/22 1344)    ED Course/ Medical Decision Making/ A&P                             Medical Decision Making Amount and/or  Complexity of Data Reviewed Radiology: ordered. Decision-making details documented in ED Course.  Risk OTC drugs. Prescription drug management.   Medical Decision Making:   Tiffany Berg is a 67 y.o. female who presented to the ED today with facial trauma detailed above.    Additional history discussed  with patient's family/caregivers.  Complete initial physical exam performed, notably the patient  was neurologically intact with 5/5 strength to bilateral upper and lower extremities, cranial nerves were intact, patient was alert and oriented with normal speech.  She did have some Perry orbital ecchymosis on the right and dried blood from the right nares but no active bleeding.  She had some tenderness to the lower back just above the buttocks but no significant midline spinal tenderness, step-offs, or deformities.    Reviewed and confirmed nursing documentation for past medical history, family history, social history.    Initial Assessment:   With the patient's presentation of facial trauma, most likely diagnosis is facial contusion, likely concussion. Differential diagnosis includes but is not limited to fracture, dislocation, hematoma, ICH, SAH, concussion.  This is most consistent with an acute complicated illness  Initial Plan:  Medications above for headache control CT maxillofacial, brain, cervical spine, lumbar spine for assessment of traumatic injuries Objective evaluation as reviewed   Initial Study Results:    Radiology:  Pending at shift change  Final Assessment and Plan:   This is a 67 year old Spanish-speaking female who presents to the ED complaining of facial trauma.  Patient reports that she was working standing in an ice machine with the lid slammed down hitting her in the face knocking her to the ground when she briefly lost consciousness.  She reports landing on her buttocks and lower back.  She states since that time she has been ambulatory but has had persistent  headaches, intermittent right epistaxis, and lower back pain.  She takes an aspirin at home a few days a week but no other anticoagulants.  She had a nosebleed just prior to arrival but on my initial exam this has resolved.  Patient neurologically intact but does have periorbital ecchymosis on the right.  No obvious facial deformities.  She has tenderness to the lower back just above the buttocks but no other midline tenderness, step-offs, or deformities.  Overall, her exam is reassuring but with significant mechanism of injury and patient's age will obtain imaging for further evaluation.  Headache treatment ordered as above for symptomatic relief. At shift change, imaging pending and care transitioned to Tiffany Sledge, PA-C, pending imaging and disposition.    Clinical Impression:  1. Contusion of face, initial encounter   2. Contusion of buttock, initial encounter   3. Right-sided epistaxis      Data Unavailable           Final Clinical Impression(s) / ED Diagnoses Final diagnoses:  Contusion of face, initial encounter  Contusion of buttock, initial encounter  Right-sided epistaxis    Rx / DC Orders ED Discharge Orders          Ordered    oxymetazoline (AFRIN NASAL SPRAY) 0.05 % nasal spray  2 times daily        11/13/22 1559              Suzzette Righter, PA-C 11/13/22 1600    Lajean Saver, MD 11/17/22 1657

## 2022-11-13 NOTE — Discharge Instructions (Addendum)
Lo atendieron en el departamento de emergencias por una hemorragia nasal y un traumatismo facial. Afortunadamente, pudimos detener el sangrado aqu en el departamento de emergencias y sus tomografas computarizadas tambin fueron en gran medida normales y tranquilizadoras. Existe cierta evidencia de una pequea cantidad de artritis en la parte baja de la espalda que puede causar algo de dolor en ocasiones. Debe controlar sus sntomas con analgsicos de venta libre segn sea necesario. Regrese al departamento de emergencias si comienza a experimentar fuertes dolores de cabeza, confusin, nuseas o vmitos.   You were seen in the emergency department for a nosebleed and facial trauma. Thankfully we were able to stop the bleeding here in the emergency department and your CT scans were also largely normal and reassuring. There is some evidence of a small amount of lower back arthritis that may cause some pain on occasion. You should manage your symptoms with over the counter pain medications as needed. Please return to the emergency department if you begin to experience severe headaches, confusion, nausea, or vomiting.

## 2022-11-13 NOTE — ED Triage Notes (Addendum)
Patient hit her nose on an ice machine Friday. Has a right sided nose bleed since then. Takes aspirin twice a week. Has a headache. Bruising to the right side of her face.

## 2022-11-14 NOTE — Progress Notes (Signed)
Remote pacemaker transmission.   

## 2022-11-15 NOTE — Progress Notes (Unsigned)
HPI: Tiffany Berg is a 67 y.o. female, who is here today to follow on recent ED visit. Evaluated in the ED on 11/13/22 for facial injury that happened at work 3 days prior to visit. She experienced a fall at work, where a heavy, metallic ice machine door struck her in the face.  She was at work when the top of an ice machine fell down hitting her in the face knocking her to the ground with no LOC.  C/O persistent headache as well as intermittent epistaxis from the right nostril. She states that she is not feeling well. Dizziness and general malaise following a work-related injury.  Requesting referral to ENT.  Head CT: *** Cervical, maxillofacial,and lumbar CT: 1. No evidence of acute intracranial abnormality or facial fracture. 2. No evidence of acute fracture or traumatic malalignment in the cervical or lumbar spine. 3. Lower lumbar facet arthropathy with degenerative grade 1 anterolisthesis of L4 on L5. Additionally, she reports persistent nausea and a burning sensation in the vaginal area.  She reports ongoing nasal bleeding, occurring once or twice daily, which temporarily resolves with pressure. There is no history of previous nasal bleeding. She denies bleeding from other sites such as gums, blood in stool,melena, or gross hematuria. She mentions a severe headache persists, with nausea and slight photophobia. The patient also reports a recent episode of blurred vision.  The patient has a history of gastrointestinal symptoms, including nausea and epigastric pain, which led to a previous diagnosis of gastritis and a prescription for Omeprazole. She reports improvement in her gastrointestinal symptoms with Omeprazole, which she discontinued two days ago. The patient denies any recent exacerbation of these symptoms but notes the persistence of mild nausea.  Additionally, the patient reports a constant burning sensation in the vaginal area, not associated with urination or discharge. She  denies recent sexual activity and has not used any over-the-counter treatments for this symptom.  Relevant Medical History: The patient has a history of being treated for gastritis with Omeprazole, which she recently discontinued. She also has a history of thyroid issues, though specific details were not discussed during this visit. There is no reported history of Helicobacter pylori infection or evaluation.  Social History: The patient experienced a lack of support from her workplace following the injury, indicating potential stress related to her work environment. She has not been sexually active recently.   Review of Systems See other pertinent positives and negatives in HPI.  Current Outpatient Medications on File Prior to Visit  Medication Sig Dispense Refill   B Complex Vitamins (VITAMIN B COMPLEX PO) Take by mouth.     levothyroxine (SYNTHROID) 75 MCG tablet Take 1 tablet (75 mcg total) by mouth daily before breakfast. 90 tablet 2   lovastatin (MEVACOR) 40 MG tablet TAKE 2 TABLETS BY MOUTH AT BEDTIME (Patient taking differently: Take 80 mg by mouth at bedtime.) 180 tablet 1   omeprazole (PRILOSEC) 40 MG capsule Take 1 capsule (40 mg total) by mouth daily. 60 capsule 0   tiZANidine (ZANAFLEX) 2 MG tablet Take 1-2 tablets (2-4 mg total) by mouth every 6 (six) hours as needed for muscle spasms. 60 tablet 1   fluticasone (FLONASE) 50 MCG/ACT nasal spray Place 1 spray into both nostrils 2 (two) times daily. 16 g 0   gabapentin (NEURONTIN) 300 MG capsule Take 1 capsule (300 mg total) by mouth 3 (three) times daily for 7 days. 21 capsule 0   No current facility-administered medications on file prior to visit.  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   w/ symptomatic bradycardia, rate 30s;  s/p  PPM 03-14-2020   Myalgia, multiple sites    Pre-diabetes    Symptomatic bradycardia 03/12/2020   s/p PPM   Wears glasses    Wears partial dentures    upper and lower   No Known Allergies  Social History   Socioeconomic History   Marital status: Legally Separated    Spouse name: Not on file   Number of children: Not on file   Years of education: Not on file   Highest education level: Not on file  Occupational History   Not on file  Tobacco Use   Smoking status: Never   Smokeless tobacco: Never  Vaping Use   Vaping Use: Never used  Substance and Sexual Activity   Alcohol use: Yes    Comment: socially   Drug use: Never   Sexual activity: Yes    Birth control/protection: Post-menopausal, Surgical  Other Topics Concern   Not on file  Social History Narrative   Not on file   Social Determinants of Health   Financial Resource Strain: Low Risk  (02/22/2022)   Overall Financial Resource Strain (CARDIA)    Difficulty of Paying Living Expenses: Not hard at all  Food Insecurity: No Food Insecurity (02/22/2022)   Hunger Vital Sign    Worried About Running Out of Food in the Last Year: Never true    Ran Out of Food in the Last Year: Never true  Transportation Needs: No Transportation Needs (02/22/2022)   PRAPARE - Hydrologist (Medical): No    Lack of Transportation (Non-Medical): No  Physical Activity: Insufficiently Active (02/22/2022)   Exercise Vital Sign    Days of Exercise per Week: 2 days     Minutes of Exercise per Session: 60 min  Stress: No Stress Concern Present (02/22/2022)   [place]    Feeling of Stress : Not at all  Social Connections: Unknown (02/22/2022)   Social Connection and Isolation Panel [NHANES]    Frequency of Communication with Friends and Family: More than three times a week    Frequency of Social Gatherings with Friends and Family: More than three times a week    Attends Religious Services: Not on file    Active Member of Clubs or Organizations: Yes    Attends Archivist Meetings: More than 4 times per year    Marital Status: Separated   Vitals:   11/17/22 1027  BP: 130/80  Pulse: 70  Resp: 16  SpO2: 97%   Body mass index is 27.01 kg/m.  Physical Exam Vitals and nursing note reviewed.  Constitutional:      General: She is not  in acute distress.    Appearance: She is well-developed.  HENT:     Head: Normocephalic and atraumatic.      Comments: Tenderness with palpation, right side. No deformity***    Nose: Nasal tenderness present.     Right Nostril: No epistaxis or septal hematoma.     Right Turbinates: Not enlarged.     Left Turbinates: Not enlarged.     Mouth/Throat:     Mouth: Mucous membranes are moist.     Pharynx: Oropharynx is clear.  Eyes:     Conjunctiva/sclera: Conjunctivae normal.  Cardiovascular:     Rate and Rhythm: Normal rate and regular rhythm.     Pulses:          Dorsalis pedis pulses are 2+ on the right side and 2+ on the left side.     Heart sounds: No murmur heard. Pulmonary:     Effort: Pulmonary effort is normal. No respiratory distress.     Breath sounds: Normal breath sounds.  Abdominal:     Palpations: Abdomen is soft. There is no hepatomegaly or mass.     Tenderness: There is no abdominal tenderness.  Lymphadenopathy:     Cervical: No cervical adenopathy.  Skin:    General: Skin is warm.     Findings: No erythema or rash.   Neurological:     General: No focal deficit present.     Mental Status: She is alert and oriented to person, place, and time.     Cranial Nerves: No cranial nerve deficit.     Gait: Gait normal.  Psychiatric:        Mood and Affect: Affect normal. Mood is anxious.     Comments: Well groomed, good eye contact.   ASSESSMENT AND PLAN:  There are no diagnoses linked to this encounter.  No orders of the defined types were placed in this encounter.   No problem-specific Assessment & Plan notes found for this encounter.   No follow-ups on file.  Tempie Gibeault G. Martinique, MD  [facility]. [place] office.

## 2022-11-17 ENCOUNTER — Encounter: Payer: Self-pay | Admitting: Family Medicine

## 2022-11-17 ENCOUNTER — Ambulatory Visit (INDEPENDENT_AMBULATORY_CARE_PROVIDER_SITE_OTHER): Payer: 59 | Admitting: Family Medicine

## 2022-11-17 VITALS — BP 130/80 | HR 70 | Resp 16 | Ht 63.0 in | Wt 152.5 lb

## 2022-11-17 DIAGNOSIS — R1013 Epigastric pain: Secondary | ICD-10-CM | POA: Diagnosis not present

## 2022-11-17 DIAGNOSIS — R110 Nausea: Secondary | ICD-10-CM

## 2022-11-17 DIAGNOSIS — S0993XD Unspecified injury of face, subsequent encounter: Secondary | ICD-10-CM | POA: Diagnosis not present

## 2022-11-17 DIAGNOSIS — G44309 Post-traumatic headache, unspecified, not intractable: Secondary | ICD-10-CM

## 2022-11-17 DIAGNOSIS — R040 Epistaxis: Secondary | ICD-10-CM

## 2022-11-17 DIAGNOSIS — N898 Other specified noninflammatory disorders of vagina: Secondary | ICD-10-CM

## 2022-11-17 LAB — CBC
HCT: 43.5 % (ref 36.0–46.0)
Hemoglobin: 14.9 g/dL (ref 12.0–15.0)
MCHC: 34.3 g/dL (ref 30.0–36.0)
MCV: 90 fl (ref 78.0–100.0)
Platelets: 252 10*3/uL (ref 150.0–400.0)
RBC: 4.83 Mil/uL (ref 3.87–5.11)
RDW: 14.1 % (ref 11.5–15.5)
WBC: 5.3 10*3/uL (ref 4.0–10.5)

## 2022-11-17 LAB — BASIC METABOLIC PANEL
BUN: 15 mg/dL (ref 6–23)
CO2: 29 mEq/L (ref 19–32)
Calcium: 9.6 mg/dL (ref 8.4–10.5)
Chloride: 102 mEq/L (ref 96–112)
Creatinine, Ser: 0.66 mg/dL (ref 0.40–1.20)
GFR: 89.75 mL/min (ref >60.00)
Glucose, Bld: 109 mg/dL — ABNORMAL HIGH (ref 70–99)
Potassium: 4 mEq/L (ref 3.5–5.1)
Sodium: 139 mEq/L (ref 135–145)

## 2022-11-17 MED ORDER — PANTOPRAZOLE SODIUM 40 MG PO TBEC: 40.0000 mg | DELAYED_RELEASE_TABLET | Freq: Every day | ORAL | 1 refills | Status: DC

## 2022-11-17 NOTE — Patient Instructions (Addendum)
A few things to remember from today's visit:  Nausea without vomiting - Plan: Helicobacter Pylori Special Antigen, Stool, CBC, Basic metabolic panel  Facial injury, subsequent encounter - Plan: Ambulatory referral to ENT  Epistaxis due to trauma - Plan: Ambulatory referral to ENT  Epigastric abdominal pain - Plan: Helicobacter Pylori Special Antigen, Stool, CBC, Basic metabolic panel  Post-traumatic headache, not intractable, unspecified chronicity pattern despus de que recoja la [place] de materia fecal, en una semana, puede empezar Pantoprazol para el estmago y la nusea. Si no mejora tenemos que considerar una cita con Materials engineer. Si el dolor de cabeza empeora sbitamente o empieza a vomitar, se tiene que ir para urgencias.  Please be sure medication list is accurate. If a new problem present, please set up appointment sooner than planned today.

## 2022-11-19 NOTE — Assessment & Plan Note (Signed)
Symptoms improved with Omeprazole but have not resolved. Recommend H. Pylori in stool and start Protonix after collecting sample. Continue GERD precautions. If not resolved GI evaluation can be considered.

## 2022-11-20 ENCOUNTER — Other Ambulatory Visit: Payer: 59

## 2022-11-20 DIAGNOSIS — R1013 Epigastric pain: Secondary | ICD-10-CM

## 2022-11-20 DIAGNOSIS — R110 Nausea: Secondary | ICD-10-CM | POA: Diagnosis not present

## 2022-11-20 NOTE — Therapy (Signed)
OUTPATIENT PHYSICAL THERAPY TREATMENT NOTE   Patient Name: Tiffany Berg MRN: [id number] DOB:20-Dec-1955, 67 y.o., female Today's Date: 11/21/2022  PCP: Martinique, Betty G, MD REFERRING PROVIDER: Martinique, Betty G, MD   END OF SESSION:   PT End of Session - 11/21/22 1525     Visit Number 2    Number of Visits 9    Date for PT Re-Evaluation 01/02/23    Authorization Type UHC / MCD    Progress Note Due on Visit 10    PT Start Time 8527    PT Stop Time 1612    PT Time Calculation (min) 41 min    Activity Tolerance Patient tolerated treatment well    Behavior During Therapy [facility] for tasks assessed/performed             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   w/ symptomatic bradycardia, rate 30s;  s/p  PPM 03-14-2020   Myalgia, multiple sites    Pre-diabetes    Symptomatic bradycardia 03/12/2020   s/p PPM   Wears glasses    Wears partial dentures    upper and lower   Past Surgical History:  Procedure Laterality Date   Barbie Banner OSTEOTOMY Right 10/15/2020   Procedure: Barbie Banner OSTEOTOMY;  Surgeon: Criselda Peaches, DPM;  Location: [place];  Service: Podiatry;  Laterality: Right;  REGIONAL BLOCK   AIKEN OSTEOTOMY Left 02/24/2022   Procedure: Barbie Banner OSTEOTOMY;  Surgeon: Criselda Peaches, DPM;  Location: [place];  Service: Podiatry;  Laterality: Left;   ANTERIOR AND POSTERIOR REPAIR N/A 02/18/2015   Procedure: ANTERIOR (CYSTOCELE) AND POSTERIOR REPAIR (RECTOCELE);  Surgeon: Everett Graff, MD;  Location: [facility];  Service: Gynecology;  Laterality: N/A;   BLADDER SUSPENSION N/A 02/18/2015   Procedure: TRANSVAGINAL TAPE (TVT) PROCEDURE;  Surgeon: Everett Graff, MD;  Location: [place] [facility];  Service: Gynecology;  Laterality: N/A;   CYSTOSCOPY Bilateral 02/18/2015   Procedure: CYSTOSCOPY;  Surgeon: Everett Graff, MD;  Location: [place] [facility];  Service: Gynecology;  Laterality: Bilateral;   GRAFT APPLICATION Left 7/82/4235   Procedure: BONE GRAFT;  Surgeon: Criselda Peaches, DPM;  Location: [place];  Service: Podiatry;  Laterality: Left;   HALLUX VALGUS LAPIDUS Right 10/15/2020   Procedure: HALLUX VALGUS LAPIDUS AND BONE GRAFT;  Surgeon: Criselda Peaches, DPM;  Location: [place];  Service: Podiatry;  Laterality: Right;  WITH IV SEDATION; REGIONAL BLOCK   HALLUX VALGUS LAPIDUS Left 02/24/2022   Procedure: HALLUX VALGUS LAPIDUS;  Surgeon: Criselda Peaches, DPM;  Location: [place];  Service: Podiatry;  Laterality: Left;   HAMMER TOE SURGERY Right 10/15/2020   Procedure: HAMMER TOE CORRECTION;  Surgeon: Criselda Peaches, DPM;  Location: [place];  Service: Podiatry;  Laterality:  Right;  REGIONAL BLOCK   HAMMER TOE SURGERY Left 02/24/2022   Procedure: HAMMER TOE CORRECTION SECOND TOE LEFT FOOT;  Surgeon: Criselda Peaches, DPM;  Location: [place];  Service: Podiatry;  Laterality: Left;   LAPAROSCOPIC ASSISTED VAGINAL HYSTERECTOMY N/A 02/18/2015   Procedure: LAPAROSCOPIC ASSISTED VAGINAL HYSTERECTOMY;  Surgeon: Everett Graff, MD;  Location: [place] [facility];  Service: Gynecology;  Laterality: N/A;   MASTECTOMY, PARTIAL Left 08-12-2013  @[facility]    excision another site   PACEMAKER IMPLANT N/A 03/14/2020   Procedure: PACEMAKER IMPLANT;  Surgeon:  Constance Haw, MD;  Location: [place] [facility];  Service: Cardiovascular;  Laterality: N/A;   RADIOACTIVE SEED GUIDED PARTIAL MASTECTOMY/AXILLARY SENTINEL NODE BIOPSY/AXILLARY NODE DISSECTION Left 07-22-2013  @[facility]    SALPINGOOPHORECTOMY Bilateral 02/18/2015   Procedure: BILATERAL SALPINGO OOPHORECTOMY;  Surgeon: Everett Graff, MD;  Location: [place] [facility];  Service: Gynecology;  Laterality: Bilateral;   Patient Active Problem List   Diagnosis Date Noted   Dyspepsia 11/17/2022   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   Major osseous defect    Prediabetes 10/31/2021   Acquired hypothyroidism 10/31/2021   Acquired hallux interphalangeus, left    Metatarsus primus varus of right foot    Hallux valgus with bunions, left    Hammertoe of second toe of left foot    Atherosclerosis of aorta ([facility]) 09/14/2020   Hyperlipidemia 06/07/2020   Symptomatic bradycardia 03/12/2020   Mobitz II 03/12/2020   Osteopenia 12/02/2019   Bilateral low back pain 10/02/2019   Pelvic prolapse 02/18/2015    REFERRING DIAG: acute pain of right shoulder  THERAPY DIAG:  Chronic right shoulder pain  Cervicalgia  Muscle weakness (generalized)  Rationale for Evaluation and Treatment Rehabilitation  PERTINENT HISTORY: see PMH above   PRECAUTIONS: pacemaker (no E-stim)   SUBJECTIVE:                                                                                                                                                                                     In-person interpreter utilized  SUBJECTIVE STATEMENT:  Patient was recently seen in the ED for facial injury after a fall at work and has been referred to ENT for her facial injury. She fell onto her back/buttock and does not think she fell on her shoulder. She reports her shoulder is "still bad but a little better in the neck. The cold makes it worse. Every time I open the fridge I have to wear a sweater."    PAIN:  Are you having pain? Yes:  NPRS scale:  8/10 Pain location: Right shoulder, arm, neck Pain description: "just hurts inside." Aggravating factors: Nocturnal pain, lifting Relieving factors: Ibuprofen OBJECTIVE: (  objective measures completed at initial evaluation unless otherwise dated)  PATIENT SURVEYS:  FOTO 47% functional status   COGNITION: Overall cognitive status: Within functional limits for tasks assessed                                  SENSATION: WFL   POSTURE: Rounded shoulder, forward head   UPPER EXTREMITY ROM:    Active ROM Right eval Left eval  Shoulder flexion 150 160  Shoulder extension      Shoulder abduction      Shoulder adduction      Shoulder internal rotation L1 T7  Shoulder external rotation T1 T2  Elbow flexion      Elbow extension      Wrist flexion      Wrist extension      Wrist ulnar deviation      Wrist radial deviation      Wrist pronation      Wrist supination      (Blank rows = not tested)   Passive right shoulder motion grossly WFL with pain at end range flexion and IR   UPPER EXTREMITY MMT:   MMT Right eval Left eval  Shoulder flexion 4 5  Shoulder extension      Shoulder abduction 4 5  Shoulder adduction      Shoulder internal rotation      Shoulder external rotation 4 5  Middle trapezius      Lower trapezius      Elbow flexion 5 5  Elbow extension 5 5  Wrist flexion      Wrist extension      Wrist ulnar deviation      Wrist radial deviation      Wrist pronation      Wrist supination      Grip strength (lbs)      (Blank rows = not tested)   Patient reports pain with all right shoulder motion   SHOULDER SPECIAL TESTS: Impingement tests: Hawkins/Kennedy impingement test: positive    JOINT MOBILITY TESTING:  Not assessed   PALPATION:  Global pain of right shoulder with palpation               TODAY'S TREATMENT:       [facility] Adult PT Treatment:                                                DATE: 11/21/22 Therapeutic Exercise: Upper trap stretch 2 x  30 sec  Seated scapular retraction x 10  Bilateral ER yellow band 2 x 10  Supine resisted horizontal shoulder abduction yellow band 2 x 10  Sidelying shoulder ER 2 x 10  Resisted rows 2 x 10 green band  Pec doorway stretch 3 x 30 sec  Resisted shoulder extension 2 x 10 red band  Levator scap stretch 2 x 30 sec  Updated HEP     [facility] Adult PT Treatment:                                                DATE: 11/07/2022 Therapeutic Exercise: Upper trap stretch Seated shoulder blade squeezes Row with yellow ER with  yellow   PATIENT EDUCATION: Education details: HEP Person educated: Patient Education method: Explanation, Demonstration, Tactile cues, Verbal cues, and Handouts Education comprehension: verbalized understanding, returned demonstration, verbal cues required, tactile cues required, and needs further education   HOME EXERCISE PROGRAM: Access Code: [email or web address]     ASSESSMENT: CLINICAL IMPRESSION: Patient arrives for first f/u reporting ongoing Rt shoulder and neck pain in NAD. She did sustain a fall at work since the initial evaluation, but this did not appear to effect her shoulder/neck condition as she reports falling onto her back/buttock and sustained a facial injury. Reviewed her initial HEP with patient requiring cues to reduce shoulder shrug. Able to progress shoulder and periscapular strengthening without an increase in shoulder pain. She reported a significant reduction in her shoulder pain at conclusion rated as a 3/10.    OBJECTIVE IMPAIRMENTS: decreased activity tolerance, decreased ROM, decreased strength, postural dysfunction, and pain.    ACTIVITY LIMITATIONS: carrying, lifting, sleeping, dressing, reach over head, and hygiene/grooming   PARTICIPATION LIMITATIONS: meal prep, cleaning, laundry, shopping, and occupation   PERSONAL FACTORS: Fitness, Past/current experiences, Time since onset of injury/illness/exacerbation, and 3+ comorbidities: see PMH noted above  are  also affecting patient's functional outcome.    REHAB POTENTIAL: Good   CLINICAL DECISION MAKING: Stable/uncomplicated   EVALUATION COMPLEXITY: Low     GOALS: Goals reviewed with patient? Yes   SHORT TERM GOALS: Target date: 12/05/2022   Patient will be I with initial HEP in order to progress with therapy. Baseline: HEP provided at eval Goal status: INITIAL   2.  PT will review FOTO with patient by 3rd visit in order to understand expected progress and outcome with therapy. Baseline: FOTO assessed at eval Goal status: INITIAL   3.  Patient will exhibit right shoulder flexion >/= 160 deg to improve overhead reach without limitation Baseline: 150 deg Goal status: INITIAL   LONG TERM GOALS: Target date: 01/02/2023   Patient will be I with final HEP to maintain progress from PT. Baseline: HEP provided at eval Goal status: INITIAL   2.  Patient will report >/= 64% status on FOTO to indicate improved functional ability. Baseline: 47% functional status Goal status: INITIAL   3.  Patient will exhibit right shoulder strength 5/5 MMT to improve lifting ability Baseline: grossly 4/5 MMT Goal status: INITIAL   4.  Patient will report right shoulder pain </= 4/10 with activity and at night to reduce functional limitations and improve sleeping Baseline: 9/10 pain Goal status: INITIAL   5.  Patient will demonstrate right shoulder IR reach behind back >/= T10 to improve grooming and dressing ability Baseline: reach to L1 Goal status: INITIAL     PLAN: PT FREQUENCY: 1x/week   PT DURATION: 8 weeks   PLANNED INTERVENTIONS: Therapeutic exercises, Therapeutic activity, Neuromuscular re-education, Balance training, Gait training, Patient/Family education, Self Care, Joint mobilization, Aquatic Therapy, Dry Needling, Cryotherapy, Moist heat, Manual therapy, and Re-evaluation   PLAN FOR NEXT SESSION: Review HEP and progress PRN, manual/stretching for right shoulder, progress rotator cuff  and periscapular strengthening as tolerated    Gwendolyn Grant, PT, DPT, ATC 11/21/22 4:13 PM

## 2022-11-21 ENCOUNTER — Ambulatory Visit: Payer: 59

## 2022-11-21 DIAGNOSIS — M6281 Muscle weakness (generalized): Secondary | ICD-10-CM

## 2022-11-21 DIAGNOSIS — M542 Cervicalgia: Secondary | ICD-10-CM

## 2022-11-21 DIAGNOSIS — M25511 Pain in right shoulder: Secondary | ICD-10-CM | POA: Diagnosis not present

## 2022-11-21 DIAGNOSIS — G8929 Other chronic pain: Secondary | ICD-10-CM

## 2022-11-21 LAB — HELICOBACTER PYLORI  SPECIAL ANTIGEN
MICRO NUMBER:: 14705400
SPECIMEN QUALITY: ADEQUATE

## 2022-11-29 ENCOUNTER — Encounter: Payer: Self-pay | Admitting: Physical Therapy

## 2022-11-29 ENCOUNTER — Other Ambulatory Visit: Payer: Self-pay

## 2022-11-29 ENCOUNTER — Ambulatory Visit: Payer: 59 | Admitting: Physical Therapy

## 2022-11-29 DIAGNOSIS — M6281 Muscle weakness (generalized): Secondary | ICD-10-CM | POA: Diagnosis not present

## 2022-11-29 DIAGNOSIS — G8929 Other chronic pain: Secondary | ICD-10-CM | POA: Diagnosis not present

## 2022-11-29 DIAGNOSIS — M25511 Pain in right shoulder: Secondary | ICD-10-CM | POA: Diagnosis not present

## 2022-11-29 DIAGNOSIS — M542 Cervicalgia: Secondary | ICD-10-CM

## 2022-11-29 NOTE — Therapy (Signed)
OUTPATIENT PHYSICAL THERAPY TREATMENT NOTE   Patient Name: Tiffany Berg MRN: [id number] DOB:[birth year], 67 y.o., female Today's Date: 11/29/2022  PCP: Martinique, Betty G, MD REFERRING PROVIDER: Martinique, Betty G, MD    END OF SESSION:   PT End of Session - 11/29/22 1613     Visit Number 3    Number of Visits 9    Date for PT Re-Evaluation 01/02/23    Authorization Type UHC / MCD    Progress Note Due on Visit 10    PT Start Time [id number]    PT Stop Time 1609    PT Time Calculation (min) 39 min    Activity Tolerance Patient tolerated treatment well    Behavior During Therapy [facility] for tasks assessed/performed              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   w/ symptomatic bradycardia, rate 30s;  s/p  PPM 03-14-2020   Myalgia, multiple sites    Pre-diabetes    Symptomatic bradycardia 03/12/2020   s/p PPM   Wears glasses    Wears partial dentures    upper and lower   Past Surgical History:  Procedure Laterality Date   Barbie Banner OSTEOTOMY Right 10/15/2020   Procedure: Barbie Banner OSTEOTOMY;  Surgeon: Criselda Peaches, DPM;  Location: [place];  Service: Podiatry;  Laterality: Right;  REGIONAL BLOCK   AIKEN OSTEOTOMY Left 02/24/2022   Procedure: Barbie Banner OSTEOTOMY;  Surgeon: Criselda Peaches, DPM;  Location: [place];  Service: Podiatry;  Laterality: Left;   ANTERIOR AND POSTERIOR REPAIR N/A 02/18/2015   Procedure: ANTERIOR (CYSTOCELE) AND POSTERIOR REPAIR (RECTOCELE);  Surgeon: Everett Graff, MD;  Location: [place] [facility];  Service: Gynecology;  Laterality: N/A;   BLADDER SUSPENSION N/A 02/18/2015   Procedure: TRANSVAGINAL TAPE (TVT) PROCEDURE;  Surgeon: Everett Graff, MD;  Location: [place] [facility];  Service: Gynecology;  Laterality: N/A;   CYSTOSCOPY Bilateral 02/18/2015   Procedure: CYSTOSCOPY;  Surgeon: Everett Graff, MD;  Location: [place] [facility];  Service: Gynecology;  Laterality: Bilateral;   GRAFT APPLICATION Left [id number]   Procedure: BONE GRAFT;  Surgeon: Criselda Peaches, DPM;  Location: [place];  Service: Podiatry;  Laterality: Left;   HALLUX VALGUS LAPIDUS Right 10/15/2020   Procedure: HALLUX VALGUS LAPIDUS AND BONE GRAFT;  Surgeon: Criselda Peaches, DPM;  Location: [place];  Service: Podiatry;  Laterality: Right;  WITH IV SEDATION; REGIONAL BLOCK   HALLUX VALGUS LAPIDUS Left 02/24/2022   Procedure: HALLUX VALGUS LAPIDUS;  Surgeon: Criselda Peaches, DPM;  Location: [place];  Service: Podiatry;  Laterality: Left;   HAMMER TOE SURGERY Right 10/15/2020   Procedure: HAMMER TOE CORRECTION;  Surgeon: Criselda Peaches, DPM;  Location: [place];  Service: Podiatry;  Laterality: Right;  REGIONAL BLOCK   HAMMER TOE SURGERY Left 02/24/2022   Procedure: HAMMER TOE CORRECTION SECOND TOE LEFT FOOT;  Surgeon: Criselda Peaches, DPM;  Location: [place];  Service: Podiatry;  Laterality: Left;   LAPAROSCOPIC ASSISTED VAGINAL HYSTERECTOMY N/A 02/18/2015   Procedure: LAPAROSCOPIC ASSISTED VAGINAL HYSTERECTOMY;  Surgeon: Everett Graff, MD;  Location: [place] [facility];  Service: Gynecology;  Laterality: N/A;   MASTECTOMY, PARTIAL Left 08-12-2013  @[facility]    excision another site   PACEMAKER IMPLANT N/A 03/14/2020   Procedure: PACEMAKER IMPLANT;  Surgeon:  Constance Haw, MD;  Location: [place] [facility];  Service: Cardiovascular;  Laterality: N/A;   RADIOACTIVE SEED GUIDED PARTIAL MASTECTOMY/AXILLARY SENTINEL NODE BIOPSY/AXILLARY NODE DISSECTION Left 07-22-2013  @[facility]    SALPINGOOPHORECTOMY Bilateral 02/18/2015   Procedure: BILATERAL SALPINGO OOPHORECTOMY;  Surgeon: Everett Graff, MD;  Location: [place] [facility];  Service: Gynecology;  Laterality: Bilateral;   Patient Active Problem List   Diagnosis Date Noted   Dyspepsia 11/17/2022   Complete AV block ([place]) 10/26/2022   Major osseous defect    Prediabetes 10/31/2021   Acquired hypothyroidism 10/31/2021   Acquired hallux interphalangeus, left    Metatarsus primus varus of right foot    Hallux valgus with bunions, left    Hammertoe of second toe of left foot    Atherosclerosis of aorta ([facility]) 09/14/2020   Hyperlipidemia 06/07/2020   Symptomatic bradycardia 03/12/2020   Mobitz II 03/12/2020   Osteopenia 12/02/2019   Bilateral low back pain 10/02/2019   Pelvic prolapse 02/18/2015    REFERRING DIAG: acute pain of right shoulder  THERAPY DIAG:  Chronic right shoulder pain  Cervicalgia  Muscle weakness (generalized)  Rationale for Evaluation and Treatment Rehabilitation  PERTINENT HISTORY: see PMH above   PRECAUTIONS: pacemaker (no E-stim)   SUBJECTIVE:                                                                                                                                                                                     In-person interpreter utilized  SUBJECTIVE STATEMENT:  Patient reports she is doing way better. States she is having a little pain but nothing like before.  PAIN:  Are you having pain? Yes:  NPRS scale: 8/10 Pain location: Right shoulder, arm, neck Pain description: "just hurts inside" Aggravating factors: Nocturnal pain, lifting Relieving factors: Ibuprofen   OBJECTIVE: (objective measures completed at initial evaluation unless otherwise  dated) PATIENT SURVEYS:  FOTO 47% functional status  POSTURE: Rounded shoulder, forward head   UPPER EXTREMITY ROM:    Active ROM Right eval Left eval  Shoulder flexion 150 160  Shoulder extension      Shoulder abduction  Shoulder adduction      Shoulder internal rotation L1 T7  Shoulder external rotation T1 T2  Elbow flexion      Elbow extension      Wrist flexion      Wrist extension      Wrist ulnar deviation      Wrist radial deviation      Wrist pronation      Wrist supination      (Blank rows = not tested)   Passive right shoulder motion grossly WFL with pain at end range flexion and IR   UPPER EXTREMITY MMT:   MMT Right eval Left eval  Shoulder flexion 4 5  Shoulder extension      Shoulder abduction 4 5  Shoulder adduction      Shoulder internal rotation      Shoulder external rotation 4 5  Middle trapezius      Lower trapezius      Elbow flexion 5 5  Elbow extension 5 5  Wrist flexion      Wrist extension      Wrist ulnar deviation      Wrist radial deviation      Wrist pronation      Wrist supination      Grip strength (lbs)      (Blank rows = not tested)   Patient reports pain with all right shoulder motion   SHOULDER SPECIAL TESTS: Impingement tests: Hawkins/Kennedy impingement test: positive    JOINT MOBILITY TESTING:  Not assessed   PALPATION:  Global pain of right shoulder with palpation               TODAY'S TREATMENT:       [facility] Adult PT Treatment:                                                DATE: 11/29/22 Therapeutic Exercise: UBE L1 x 4 min (fwd/bwd) while taking subjective Supine dowel flexion with 2# x 1 5 Supine horizontal abduction with red 2 x 10 Supine double ER with red 2 x 10 Upper trap and levator scap stretch stretch 2 x 15 sec each Row with green 2 x 15 Extension with red 2 x 15 ER / IR with red 2 x 15 Sidelying abduction with 2# x 15 Seated shoulder scaption with 1# 2 x 10   [facility] Adult PT Treatment:                                                 DATE: 11/21/22 Therapeutic Exercise: Upper trap stretch 2 x 30 sec  Seated scapular retraction x 10  Bilateral ER yellow band 2 x 10  Supine resisted horizontal shoulder abduction yellow band 2 x 10  Sidelying shoulder ER 2 x 10  Resisted rows 2 x 10 green band  Pec doorway stretch 3 x 30 sec  Resisted shoulder extension 2 x 10 red band  Levator scap stretch 2 x 30 sec  Updated HEP   [facility] Adult PT Treatment:  DATE: 11/07/2022 Therapeutic Exercise: Upper trap stretch Seated shoulder blade squeezes Row with yellow ER with yellow   PATIENT EDUCATION: Education details: HEP Person educated: Patient Education method: Consulting civil engineer, Demonstration, Corporate treasurer cues, Verbal cues Education comprehension: verbalized understanding, returned demonstration, verbal cues required, tactile cues required, and needs further education   HOME EXERCISE PROGRAM: Access Code: [email or web address]     ASSESSMENT: CLINICAL IMPRESSION: Patient tolerated therapy well with no adverse effects. Therapy continues to focus on progressing postural control and rotator cuff strengthening with good tolerance. She reports improvement in her symptoms this visit and was able to tolerate progression in strengthening exercises. She does continue to exhibit gross strength deficits and reported fatigue with exercises. No changes made to her HEP this visit. Patient would benefit from continued skilled PT to progress her mobility and strength in order to reduce pain and maximize functional ability.    OBJECTIVE IMPAIRMENTS: decreased activity tolerance, decreased ROM, decreased strength, postural dysfunction, and pain.    ACTIVITY LIMITATIONS: carrying, lifting, sleeping, dressing, reach over head, and hygiene/grooming   PARTICIPATION LIMITATIONS: meal prep, cleaning, laundry, shopping, and occupation   PERSONAL FACTORS: Fitness, Past/current  experiences, Time since onset of injury/illness/exacerbation, and 3+ comorbidities: see PMH noted above  are also affecting patient's functional outcome.      GOALS: Goals reviewed with patient? Yes   SHORT TERM GOALS: Target date: 12/05/2022   Patient will be I with initial HEP in order to progress with therapy. Baseline: HEP provided at eval Goal status: INITIAL   2.  PT will review FOTO with patient by 3rd visit in order to understand expected progress and outcome with therapy. Baseline: FOTO assessed at eval Goal status: INITIAL   3.  Patient will exhibit right shoulder flexion >/= 160 deg to improve overhead reach without limitation Baseline: 150 deg Goal status: INITIAL   LONG TERM GOALS: Target date: 01/02/2023   Patient will be I with final HEP to maintain progress from PT. Baseline: HEP provided at eval Goal status: INITIAL   2.  Patient will report >/= 64% status on FOTO to indicate improved functional ability. Baseline: 47% functional status Goal status: INITIAL   3.  Patient will exhibit right shoulder strength 5/5 MMT to improve lifting ability Baseline: grossly 4/5 MMT Goal status: INITIAL   4.  Patient will report right shoulder pain </= 4/10 with activity and at night to reduce functional limitations and improve sleeping Baseline: 9/10 pain Goal status: INITIAL   5.  Patient will demonstrate right shoulder IR reach behind back >/= T10 to improve grooming and dressing ability Baseline: reach to L1 Goal status: INITIAL     PLAN: PT FREQUENCY: 1x/week   PT DURATION: 8 weeks   PLANNED INTERVENTIONS: Therapeutic exercises, Therapeutic activity, Neuromuscular re-education, Balance training, Gait training, Patient/Family education, Self Care, Joint mobilization, Aquatic Therapy, Dry Needling, Cryotherapy, Moist heat, Manual therapy, and Re-evaluation   PLAN FOR NEXT SESSION: Review HEP and progress PRN, manual/stretching for right shoulder, progress rotator  cuff and periscapular strengthening as tolerated    Hilda Blades, PT, DPT, LAT, ATC 11/29/22  4:14 PM Phone: [phone] Fax: [phone]

## 2022-12-05 ENCOUNTER — Ambulatory Visit: Payer: 59 | Admitting: Physical Therapy

## 2022-12-07 ENCOUNTER — Ambulatory Visit: Payer: 59 | Attending: Family Medicine

## 2022-12-07 DIAGNOSIS — M25511 Pain in right shoulder: Secondary | ICD-10-CM | POA: Insufficient documentation

## 2022-12-07 DIAGNOSIS — M6281 Muscle weakness (generalized): Secondary | ICD-10-CM | POA: Diagnosis not present

## 2022-12-07 DIAGNOSIS — M542 Cervicalgia: Secondary | ICD-10-CM | POA: Insufficient documentation

## 2022-12-07 DIAGNOSIS — G8929 Other chronic pain: Secondary | ICD-10-CM | POA: Diagnosis not present

## 2022-12-07 NOTE — Therapy (Signed)
OUTPATIENT PHYSICAL THERAPY TREATMENT NOTE   Patient Name: Tiffany Berg MRN: [id number] DOB:[birth year], 67 y.o., female Today's Date: 12/07/2022  PCP: Martinique, Betty G, MD REFERRING PROVIDER: Martinique, Betty G, MD    END OF SESSION:   PT End of Session - 12/07/22 1238     Visit Number 4    Number of Visits 9    Date for PT Re-Evaluation 01/02/23    Authorization Type UHC / MCD    Progress Note Due on Visit 10    PT Start Time 1237   patient late   PT Stop Time [id number]    PT Time Calculation (min) 38 min    Activity Tolerance Patient tolerated treatment well    Behavior During Therapy [facility] for tasks assessed/performed               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   w/ symptomatic bradycardia, rate 30s;  s/p  PPM 03-14-2020   Myalgia, multiple sites    Pre-diabetes    Symptomatic bradycardia 03/12/2020   s/p PPM   Wears glasses    Wears partial dentures    upper and lower   Past Surgical History:  Procedure Laterality Date   Barbie Banner OSTEOTOMY Right 10/15/2020   Procedure: Barbie Banner OSTEOTOMY;  Surgeon: Criselda Peaches, DPM;  Location: [place];  Service: Podiatry;  Laterality: Right;  REGIONAL BLOCK   AIKEN OSTEOTOMY Left 02/24/2022   Procedure: Barbie Banner OSTEOTOMY;  Surgeon: Criselda Peaches, DPM;  Location:  [place];  Service: Podiatry;  Laterality: Left;   ANTERIOR AND POSTERIOR REPAIR N/A 02/18/2015   Procedure: ANTERIOR (CYSTOCELE) AND POSTERIOR REPAIR (RECTOCELE);  Surgeon: Everett Graff, MD;  Location: [place] [facility];  Service: Gynecology;  Laterality: N/A;   BLADDER SUSPENSION N/A 02/18/2015   Procedure: TRANSVAGINAL TAPE (TVT) PROCEDURE;  Surgeon: Everett Graff, MD;  Location: [place] [facility];  Service: Gynecology;  Laterality: N/A;   CYSTOSCOPY Bilateral 02/18/2015   Procedure: CYSTOSCOPY;  Surgeon: Everett Graff, MD;  Location: [place] [facility];  Service: Gynecology;  Laterality: Bilateral;   GRAFT APPLICATION Left [id number]   Procedure: BONE GRAFT;  Surgeon: Criselda Peaches, DPM;  Location: [place];  Service: Podiatry;  Laterality: Left;   HALLUX VALGUS LAPIDUS Right 10/15/2020   Procedure: HALLUX VALGUS LAPIDUS AND BONE GRAFT;  Surgeon: Criselda Peaches, DPM;  Location: [place];  Service: Podiatry;  Laterality: Right;  WITH IV SEDATION; REGIONAL BLOCK   HALLUX VALGUS LAPIDUS Left 02/24/2022   Procedure: HALLUX VALGUS LAPIDUS;  Surgeon: Criselda Peaches, DPM;  Location: [place];  Service: Podiatry;  Laterality: Left;   HAMMER TOE SURGERY Right 10/15/2020   Procedure: HAMMER TOE CORRECTION;  Surgeon: Criselda Peaches, DPM;  Location: [place]  [facility];  Service: Podiatry;  Laterality: Right;  REGIONAL BLOCK   HAMMER TOE SURGERY Left 02/24/2022   Procedure: HAMMER TOE CORRECTION SECOND TOE LEFT FOOT;  Surgeon: Criselda Peaches, DPM;  Location: [place];  Service: Podiatry;  Laterality: Left;   LAPAROSCOPIC ASSISTED VAGINAL HYSTERECTOMY N/A 02/18/2015   Procedure: LAPAROSCOPIC ASSISTED VAGINAL HYSTERECTOMY;  Surgeon: Everett Graff, MD;  Location: [place] [facility];  Service: Gynecology;  Laterality: N/A;   MASTECTOMY, PARTIAL Left 08-12-2013  @[facility]    excision another site   PACEMAKER IMPLANT N/A 03/14/2020   Procedure: PACEMAKER  IMPLANT;  Surgeon: Constance Haw, MD;  Location: [place] [facility];  Service: Cardiovascular;  Laterality: N/A;   RADIOACTIVE SEED GUIDED PARTIAL MASTECTOMY/AXILLARY SENTINEL NODE BIOPSY/AXILLARY NODE DISSECTION Left 07-22-2013  @[facility]    SALPINGOOPHORECTOMY Bilateral 02/18/2015   Procedure: BILATERAL SALPINGO OOPHORECTOMY;  Surgeon: Everett Graff, MD;  Location: [place] [facility];  Service: Gynecology;  Laterality: Bilateral;   Patient Active Problem List   Diagnosis Date Noted   Dyspepsia 11/17/2022   Complete AV block 10/26/2022   Major osseous defect    Prediabetes 10/31/2021   Acquired hypothyroidism 10/31/2021   Acquired hallux interphalangeus, left    Metatarsus primus varus of right foot    Hallux valgus with bunions, left    Hammertoe of second toe of left foot    Atherosclerosis of aorta 09/14/2020   Hyperlipidemia 06/07/2020   Symptomatic bradycardia 03/12/2020   Mobitz II 03/12/2020   Osteopenia 12/02/2019   Bilateral low back pain 10/02/2019   Pelvic prolapse 02/18/2015    REFERRING DIAG: acute pain of right shoulder  THERAPY DIAG:  Chronic right shoulder pain  Cervicalgia  Muscle weakness (generalized)  Rationale for Evaluation and Treatment Rehabilitation  PERTINENT HISTORY: see PMH above   PRECAUTIONS: pacemaker (no E-stim)   SUBJECTIVE:                                                                                                                                                                                     In-person interpreter utilized  SUBJECTIVE STATEMENT:  "Much better. I have been doing my exercises at home. Very slight pain in my shoulder right now."  PAIN:  Are you having pain? Yes:  NPRS scale: 3/10 Pain location: Right shoulder, arm, neck Pain description: "just hurts inside" Aggravating factors: Nocturnal pain, lifting Relieving factors: Ibuprofen   OBJECTIVE: (objective measures completed at initial evaluation unless otherwise  dated) PATIENT SURVEYS:  FOTO 47% functional status  POSTURE: Rounded shoulder, forward head   UPPER EXTREMITY ROM:    Active ROM Right eval Left eval 12/07/22 Right   Shoulder flexion 150 160 160  Shoulder extension  Shoulder abduction       Shoulder adduction       Shoulder internal rotation L1 T7 T9  Shoulder external rotation T1 T2 T2  Elbow flexion       Elbow extension       Wrist flexion       Wrist extension       Wrist ulnar deviation       Wrist radial deviation       Wrist pronation       Wrist supination       (Blank rows = not tested)   Passive right shoulder motion grossly WFL with pain at end range flexion and IR   UPPER EXTREMITY MMT:   MMT Right eval Left eval  Shoulder flexion 4 5  Shoulder extension      Shoulder abduction 4 5  Shoulder adduction      Shoulder internal rotation      Shoulder external rotation 4 5  Middle trapezius      Lower trapezius      Elbow flexion 5 5  Elbow extension 5 5  Wrist flexion      Wrist extension      Wrist ulnar deviation      Wrist radial deviation      Wrist pronation      Wrist supination      Grip strength (lbs)      (Blank rows = not tested)   Patient reports pain with all right shoulder motion   SHOULDER SPECIAL TESTS: Impingement tests: Hawkins/Kennedy impingement test: positive    JOINT MOBILITY TESTING:  Not assessed   PALPATION:  Global pain of right shoulder with palpation               TODAY'S TREATMENT:       [facility] Adult PT Treatment:                                                DATE: 12/07/22 Therapeutic Exercise: UBE level 1.5 x 2 min each fwd/bwd  Sidelying horizontal shoulder abduction 2 x 10; 1# Sidelying ER 2 x 10; 1# Supine shoulder diagonals 2 x 10; yellow band  Standing horizontal shoulder abduction yellow band 2 x 10  Standing shoulder flexion 1# 2 x 10  Standing shoulder scaption 1# 2 x 10  Bilateral shoulder ER red band 2 x 10  Updated HEP     [facility] Adult  PT Treatment:                                                DATE: 11/29/22 Therapeutic Exercise: UBE L1 x 4 min (fwd/bwd) while taking subjective Supine dowel flexion with 2# x 1 5 Supine horizontal abduction with red 2 x 10 Supine double ER with red 2 x 10 Upper trap and levator scap stretch stretch 2 x 15 sec each Row with green 2 x 15 Extension with red 2 x 15 ER / IR with red 2 x 15 Sidelying abduction with 2# x 15 Seated shoulder scaption with 1# 2 x 10   [facility] Adult PT Treatment:  DATE: 11/21/22 Therapeutic Exercise: Upper trap stretch 2 x 30 sec  Seated scapular retraction x 10  Bilateral ER yellow band 2 x 10  Supine resisted horizontal shoulder abduction yellow band 2 x 10  Sidelying shoulder ER 2 x 10  Resisted rows 2 x 10 green band  Pec doorway stretch 3 x 30 sec  Resisted shoulder extension 2 x 10 red band  Levator scap stretch 2 x 30 sec  Updated HEP   [facility] Adult PT Treatment:                                                DATE: 11/07/2022 Therapeutic Exercise: Upper trap stretch Seated shoulder blade squeezes Row with yellow ER with yellow   PATIENT EDUCATION: Education details: HEP update Person educated: Patient Education method: Explanation, Demonstration, Tactile cues, Verbal cues, handtou  Education comprehension: verbalized understanding, returned demonstration, verbal cues required, tactile cues required, and needs further education   HOME EXERCISE PROGRAM: Access Code: [email or web address]     ASSESSMENT: CLINICAL IMPRESSION: Session somewhat limited as patient was late for scheduled appointment. Patient continues to report improvement in her Rt shoulder pain. Continued progression of shoulder and periscapular strengthening with good tolerance. Intermittent cues required for proper posture with standing activity as she has tendency to compensate with upper trap engagement. She demonstrates improved Rt shoulder AROM  compared to initial evaluation. All short term functional goals and one long term functional goal have been met at this time.     OBJECTIVE IMPAIRMENTS: decreased activity tolerance, decreased ROM, decreased strength, postural dysfunction, and pain.    ACTIVITY LIMITATIONS: carrying, lifting, sleeping, dressing, reach over head, and hygiene/grooming   PARTICIPATION LIMITATIONS: meal prep, cleaning, laundry, shopping, and occupation   PERSONAL FACTORS: Fitness, Past/current experiences, Time since onset of injury/illness/exacerbation, and 3+ comorbidities: see PMH noted above  are also affecting patient's functional outcome.      GOALS: Goals reviewed with patient? Yes   SHORT TERM GOALS: Target date: 12/05/2022   Patient will be I with initial HEP in order to progress with therapy. Baseline: HEP provided at eval Goal status: met    2.  PT will review FOTO with patient by 3rd visit in order to understand expected progress and outcome with therapy. Baseline: FOTO assessed at eval Goal status: met   3.  Patient will exhibit right shoulder flexion >/= 160 deg to improve overhead reach without limitation Baseline: 150 deg Goal status: met   LONG TERM GOALS: Target date: 01/02/2023   Patient will be I with final HEP to maintain progress from PT. Baseline: HEP provided at eval Goal status: INITIAL   2.  Patient will report >/= 64% status on FOTO to indicate improved functional ability. Baseline: 47% functional status Goal status: INITIAL   3.  Patient will exhibit right shoulder strength 5/5 MMT to improve lifting ability Baseline: grossly 4/5 MMT Goal status: INITIAL   4.  Patient will report right shoulder pain </= 4/10 with activity and at night to reduce functional limitations and improve sleeping Baseline: 9/10 pain Goal status: INITIAL   5.  Patient will demonstrate right shoulder IR reach behind back >/= T10 to improve grooming and dressing ability Baseline: reach to  L1 Goal status: met      PLAN: PT FREQUENCY: 1x/week   PT DURATION: 8 weeks   PLANNED INTERVENTIONS:  Therapeutic exercises, Therapeutic activity, Neuromuscular re-education, Balance training, Gait training, Patient/Family education, Self Care, Joint mobilization, Aquatic Therapy, Dry Needling, Cryotherapy, Moist heat, Manual therapy, and Re-evaluation   PLAN FOR NEXT SESSION: Review HEP and progress PRN, manual/stretching for right shoulder, progress rotator cuff and periscapular strengthening as tolerated   Gwendolyn Grant, PT, DPT, ATC 12/07/22 1:16 PM

## 2022-12-12 ENCOUNTER — Ambulatory Visit: Payer: 59

## 2022-12-12 DIAGNOSIS — G8929 Other chronic pain: Secondary | ICD-10-CM | POA: Diagnosis not present

## 2022-12-12 DIAGNOSIS — M6281 Muscle weakness (generalized): Secondary | ICD-10-CM

## 2022-12-12 DIAGNOSIS — M542 Cervicalgia: Secondary | ICD-10-CM

## 2022-12-12 DIAGNOSIS — M25511 Pain in right shoulder: Secondary | ICD-10-CM | POA: Diagnosis not present

## 2022-12-12 NOTE — Therapy (Signed)
OUTPATIENT PHYSICAL THERAPY TREATMENT NOTE   Patient Name: Tiffany Berg MRN: [id number] DOB:Jan 29, 1956, 67 y.o., female Today's Date: 12/12/2022  PCP: [place], Betty G, MD REFERRING PROVIDER: [place], Betty G, MD    END OF SESSION:   PT End of Session - 12/12/22 1530     Visit Number 5    Number of Visits 9    Date for PT Re-Evaluation 01/02/23    Authorization Type UHC / MCD    Progress Note Due on Visit 10    PT Start Time 1530    PT Stop Time 1611    PT Time Calculation (min) 41 min    Activity Tolerance Patient tolerated treatment well    Behavior During Therapy [facility] for tasks assessed/performed                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   w/ symptomatic bradycardia, rate 30s;  s/p  PPM 03-14-2020   Myalgia, multiple sites    Pre-diabetes    Symptomatic bradycardia 03/12/2020   s/p PPM   Wears glasses    Wears partial dentures    upper and lower   Past Surgical History:  Procedure Laterality Date   Quintella Reichert OSTEOTOMY Right 10/15/2020   Procedure: Quintella Reichert OSTEOTOMY;  Surgeon: Edwin Cap, DPM;  Location: [facility] [place];  Service: Podiatry;  Laterality: Right;  REGIONAL BLOCK   AIKEN OSTEOTOMY Left 02/24/2022   Procedure: Quintella Reichert OSTEOTOMY;  Surgeon: Edwin Cap, DPM;  [facility];  Service: Podiatry;  Laterality: Left;   ANTERIOR AND POSTERIOR REPAIR N/A 02/18/2015   Procedure: ANTERIOR (CYSTOCELE) AND POSTERIOR REPAIR (RECTOCELE);  Surgeon: Osborn Coho, MD;  Location: [facility];  Service: Gynecology;  Laterality: N/A;   BLADDER SUSPENSION N/A 02/18/2015   Procedure: TRANSVAGINAL TAPE (TVT) PROCEDURE;  Surgeon: Osborn Coho, MD;  Location: [facility];  Service: Gynecology;  Laterality: N/A;   CYSTOSCOPY Bilateral 02/18/2015   Procedure: CYSTOSCOPY;  Surgeon: Osborn Coho, MD;  Location: [facility];  Service: Gynecology;  Laterality: Bilateral;   GRAFT APPLICATION Left 02/24/2022   Procedure: BONE GRAFT;  Surgeon: Edwin Cap, DPM;  Location: [facility];  Service: Podiatry;  Laterality: Left;   HALLUX VALGUS LAPIDUS Right 10/15/2020   Procedure: HALLUX VALGUS LAPIDUS AND BONE GRAFT;  Surgeon: Edwin Cap, DPM;  Location: [facility] [place];  Service: Podiatry;  Laterality: Right;  WITH IV SEDATION; REGIONAL BLOCK   HALLUX VALGUS LAPIDUS Left 02/24/2022   Procedure: HALLUX VALGUS LAPIDUS;  Surgeon: Edwin Cap, DPM;  Location: [facility];  Service: Podiatry;  Laterality: Left;   HAMMER TOE SURGERY Right 10/15/2020   Procedure: HAMMER TOE CORRECTION;  Surgeon: Edwin Cap, DPM;  Location: [facility] ;  Service: Podiatry;  Laterality: Right;  REGIONAL BLOCK   HAMMER TOE SURGERY Left 02/24/2022   Procedure: HAMMER TOE CORRECTION SECOND TOE LEFT FOOT;  Surgeon: Edwin Cap, DPM;  Location: [facility] [place];  Service: Podiatry;  Laterality: Left;   LAPAROSCOPIC ASSISTED VAGINAL HYSTERECTOMY N/A 02/18/2015   Procedure: LAPAROSCOPIC ASSISTED VAGINAL HYSTERECTOMY;  Surgeon: Osborn Coho, MD;  Location: [facility];  Service: Gynecology;  Laterality: N/A;   MASTECTOMY, PARTIAL Left 08-12-2013  @[facility]    excision another site   PACEMAKER IMPLANT N/A 03/14/2020   Procedure: PACEMAKER IMPLANT;   Surgeon: Regan Lemming, MD;  Location: [facility];  Service: Cardiovascular;  Laterality: N/A;   RADIOACTIVE SEED GUIDED PARTIAL MASTECTOMY/AXILLARY SENTINEL NODE BIOPSY/AXILLARY NODE DISSECTION Left 07-22-2013  @[facility]    SALPINGOOPHORECTOMY Bilateral 02/18/2015   Procedure: BILATERAL SALPINGO OOPHORECTOMY;  Surgeon: Osborn Coho, MD;  Location: [facility];  Service: Gynecology;  Laterality: Bilateral;   Patient Active Problem List   Diagnosis Date Noted   Dyspepsia 11/17/2022   Complete AV block 10/26/2022   Major osseous defect    Prediabetes 10/31/2021   Acquired hypothyroidism 10/31/2021   Acquired hallux interphalangeus, left    Metatarsus primus varus of right foot    Hallux valgus with bunions, left    Hammertoe of second toe of left foot    Atherosclerosis of aorta 09/14/2020   Hyperlipidemia 06/07/2020   Symptomatic bradycardia 03/12/2020   Mobitz II 03/12/2020   Osteopenia 12/02/2019   Bilateral low back pain 10/02/2019   Pelvic prolapse 02/18/2015    REFERRING DIAG: acute pain of right shoulder  THERAPY DIAG:  Chronic right shoulder pain  Cervicalgia  Muscle weakness (generalized)  Rationale for Evaluation and Treatment Rehabilitation  PERTINENT HISTORY: see PMH above   PRECAUTIONS: pacemaker (no E-stim)   SUBJECTIVE:                                                                                                                                                                                     In-person interpreter utilized  SUBJECTIVE STATEMENT:  "Much better. The exercises are going well." She reports "just a little pain" from driving in traffic causing her to tense up.   PAIN:  Are you having pain? Yes:  NPRS scale: 4/10 Pain location: Right upper trap  Pain description: "just hurts" Aggravating factors: Nocturnal pain, lifting Relieving factors: Ibuprofen   OBJECTIVE: (objective measures completed at initial evaluation unless otherwise  dated) PATIENT SURVEYS:  FOTO 47% functional status  POSTURE: Rounded shoulder, forward head   UPPER EXTREMITY ROM:    Active ROM Right eval Left eval 12/07/22 Right   Shoulder flexion 150 160 160  Shoulder extension       Shoulder abduction       Shoulder adduction       Shoulder internal rotation L1 T7 T9  Shoulder external rotation T1 T2 T2  Elbow flexion       Elbow extension       Wrist flexion       Wrist extension       Wrist ulnar deviation       Wrist radial deviation       Wrist pronation       Wrist supination       (Blank rows = not tested)   Passive right shoulder motion grossly WFL with pain at end range flexion and IR   UPPER EXTREMITY MMT:   MMT Right eval Left eval 12/12/22 Right   Shoulder flexion 4 5 4  pn  Shoulder extension       Shoulder abduction 4 5 5   Shoulder adduction       Shoulder internal rotation       Shoulder external rotation 4 5   Middle trapezius       Lower trapezius       Elbow flexion 5 5   Elbow extension 5 5   Wrist flexion       Wrist extension       Wrist ulnar deviation       Wrist radial deviation       Wrist pronation       Wrist supination       Grip strength (lbs)       (Blank rows = not tested)   Patient reports pain with all right shoulder motion   SHOULDER SPECIAL TESTS: Impingement tests: Hawkins/Kennedy impingement test: positive    JOINT MOBILITY TESTING:  Not assessed   PALPATION:  Global pain of right shoulder with palpation               TODAY'S TREATMENT:       [facility] Adult PT Treatment:                                                DATE: 12/12/22 Therapeutic Exercise: UBE level 2 x 2 min each fwd/bwd  Shoulder ER/IR red band 2 x 10  Resisted shoulder flexion 2 x 10 @ 2lbs  Resisted shoulder scaption 2 x 10 @ 2 lbs  Serratus wall slides 2 x 10  Resisted shoulder adduction 2 x 10; red band  Updated HEP     [facility] Adult PT Treatment:                                                DATE:  12/07/22 Therapeutic Exercise: UBE level 1.5 x 2 min each fwd/bwd  Sidelying horizontal shoulder abduction 2 x 10; 1# Sidelying ER 2 x 10; 1# Supine shoulder diagonals 2 x 10; yellow band  Standing horizontal shoulder abduction yellow band 2 x 10  Standing shoulder flexion 1# 2 x 10  Standing shoulder scaption 1# 2 x 10  Bilateral shoulder ER red band 2 x 10  Updated HEP     [facility] Adult PT Treatment:  DATE: 11/29/22 Therapeutic Exercise: UBE L1 x 4 min (fwd/bwd) while taking subjective Supine dowel flexion with 2# x 1 5 Supine horizontal abduction with red 2 x 10 Supine double ER with red 2 x 10 Upper trap and levator scap stretch stretch 2 x 15 sec each Row with green 2 x 15 Extension with red 2 x 15 ER / IR with red 2 x 15 Sidelying abduction with 2# x 15 Seated shoulder scaption with 1# 2 x 10  PATIENT EDUCATION: Education details: HEP update Person educated: Patient Education method: Explanation, Demonstration, Tactile cues, Verbal cues, handtou  Education comprehension: verbalized understanding, returned demonstration, verbal cues required, tactile cues required, and needs further education   HOME EXERCISE PROGRAM: Access Code: [email or web address]     ASSESSMENT: CLINICAL IMPRESSION: Patient reports mild Rt upper trap pain upon arrival attributed to tensing up while driving in traffic on the way to this appointment. Continued progression of Rt shoulder strengthening with good tolerance. Occasional cues required to reduce shoulder shrug. She demonstrates full Rt shoulder abduction strength, but mild pain and weakness remain with MMT of shoulder flexion. Mild pain reported with resisted shoulder scaption, otherwise no reports of pain throughout session.     OBJECTIVE IMPAIRMENTS: decreased activity tolerance, decreased ROM, decreased strength, postural dysfunction, and pain.    ACTIVITY LIMITATIONS: carrying, lifting, sleeping, dressing,  reach over head, and hygiene/grooming   PARTICIPATION LIMITATIONS: meal prep, cleaning, laundry, shopping, and occupation   PERSONAL FACTORS: Fitness, Past/current experiences, Time since onset of injury/illness/exacerbation, and 3+ comorbidities: see PMH noted above  are also affecting patient's functional outcome.      GOALS: Goals reviewed with patient? Yes   SHORT TERM GOALS: Target date: 12/05/2022   Patient will be I with initial HEP in order to progress with therapy. Baseline: HEP provided at eval Goal status: met    2.  PT will review FOTO with patient by 3rd visit in order to understand expected progress and outcome with therapy. Baseline: FOTO assessed at eval Goal status: met   3.  Patient will exhibit right shoulder flexion >/= 160 deg to improve overhead reach without limitation Baseline: 150 deg Goal status: met   LONG TERM GOALS: Target date: 01/02/2023   Patient will be I with final HEP to maintain progress from PT. Baseline: HEP provided at eval Goal status: INITIAL   2.  Patient will report >/= 64% status on FOTO to indicate improved functional ability. Baseline: 47% functional status Goal status: INITIAL   3.  Patient will exhibit right shoulder strength 5/5 MMT to improve lifting ability Baseline: grossly 4/5 MMT Goal status: partially met    4.  Patient will report right shoulder pain </= 4/10 with activity and at night to reduce functional limitations and improve sleeping Baseline: 9/10 pain Goal status: INITIAL   5.  Patient will demonstrate right shoulder IR reach behind back >/= T10 to improve grooming and dressing ability Baseline: reach to L1 Goal status: met      PLAN: PT FREQUENCY: 1x/week   PT DURATION: 8 weeks   PLANNED INTERVENTIONS: Therapeutic exercises, Therapeutic activity, Neuromuscular re-education, Balance training, Gait training, Patient/Family education, Self Care, Joint mobilization, Aquatic Therapy, Dry Needling, Cryotherapy,  Moist heat, Manual therapy, and Re-evaluation   PLAN FOR NEXT SESSION: Review HEP and progress PRN, manual/stretching for right shoulder, progress rotator cuff and periscapular strengthening as tolerated   Letitia LibraSamantha Meggan Dhaliwal, PT, DPT, ATC 12/12/22 4:15 PM

## 2022-12-21 NOTE — Therapy (Signed)
OUTPATIENT PHYSICAL THERAPY TREATMENT NOTE   Patient Name: Tiffany Berg MRN: [id number] DOB:30-Aug-1956, 67 y.o., female Today's Date: 12/22/2022  PCP: [place], Betty G, MD REFERRING PROVIDER: [place], Betty G, MD    END OF SESSION:   PT End of Session - 12/22/22 1419     Visit Number 6    Number of Visits 9    Date for PT Re-Evaluation 01/02/23    Authorization Type UHC / MCD    Progress Note Due on Visit 10    PT Start Time 1419   patient arrived late   PT Stop Time 1445    PT Time Calculation (min) 26 min    Activity Tolerance Patient tolerated treatment well    Behavior During Therapy [facility] for tasks assessed/performed                 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   w/ symptomatic bradycardia, rate 30s;  s/p  PPM 03-14-2020   Myalgia, multiple sites    Pre-diabetes    Symptomatic bradycardia 03/12/2020   s/p PPM   Wears glasses    Wears partial dentures    upper and lower   Past Surgical History:  Procedure Laterality Date   Quintella Reichert OSTEOTOMY Right 10/15/2020   Procedure: Quintella Reichert OSTEOTOMY;  Surgeon: Edwin Cap, DPM;  Location: [facility] [place];  Service: Podiatry;  Laterality: Right;  REGIONAL BLOCK   AIKEN OSTEOTOMY Left 02/24/2022   Procedure: Quintella Reichert OSTEOTOMY;  Surgeon: Edwin Cap, DPM;   Location: [facility] [place];  Service: Podiatry;  Laterality: Left;   ANTERIOR AND POSTERIOR REPAIR N/A 02/18/2015   Procedure: ANTERIOR (CYSTOCELE) AND POSTERIOR REPAIR (RECTOCELE);  Surgeon: Osborn Coho, MD;  Location: [facility];  Service: Gynecology;  Laterality: N/A;   BLADDER SUSPENSION N/A 02/18/2015   Procedure: TRANSVAGINAL TAPE (TVT) PROCEDURE;  Surgeon: Osborn Coho, MD;  Location: [facility];  Service: Gynecology;  Laterality: N/A;   CYSTOSCOPY Bilateral 02/18/2015   Procedure: CYSTOSCOPY;  Surgeon: Osborn Coho, MD;  Location: [facility];  Service: Gynecology;  Laterality: Bilateral;   GRAFT APPLICATION Left 02/24/2022   Procedure: BONE GRAFT;  Surgeon: Edwin Cap, DPM;  Location: [facility] [place];  Service: Podiatry;  Laterality: Left;   HALLUX VALGUS LAPIDUS Right 10/15/2020   Procedure: HALLUX VALGUS LAPIDUS AND BONE GRAFT;  Surgeon: Edwin Cap, DPM;  Location: [facility] [place];  Service: Podiatry;  Laterality: Right;  WITH IV SEDATION; REGIONAL BLOCK   HALLUX VALGUS LAPIDUS Left 02/24/2022   Procedure: HALLUX VALGUS LAPIDUS;  Surgeon: Edwin Cap, DPM;  Location: [facility] ;  Service: Podiatry;  Laterality: Left;   HAMMER TOE SURGERY Right 10/15/2020   Procedure: HAMMER TOE CORRECTION;  Surgeon: Edwin Cap, DPM;  Location:  [facility];  Service: Podiatry;  Laterality: Right;  REGIONAL BLOCK   HAMMER TOE SURGERY Left 02/24/2022   Procedure: HAMMER TOE CORRECTION SECOND TOE LEFT FOOT;  Surgeon: Edwin Cap, DPM;  Location: [facility];  Service: Podiatry;  Laterality: Left;   LAPAROSCOPIC ASSISTED VAGINAL HYSTERECTOMY N/A 02/18/2015   Procedure: LAPAROSCOPIC ASSISTED VAGINAL HYSTERECTOMY;  Surgeon: Osborn Coho, MD;  Location: [facility];  Service: Gynecology;  Laterality: N/A;   MASTECTOMY, PARTIAL Left 08-12-2013     excision another site   PACEMAKER IMPLANT N/A 03/14/2020   Procedure:  PACEMAKER IMPLANT;  Surgeon: Regan Lemming, MD;  Location: [facility];  Service: Cardiovascular;  Laterality: N/A;   RADIOACTIVE SEED GUIDED PARTIAL MASTECTOMY/AXILLARY SENTINEL NODE BIOPSY/AXILLARY NODE DISSECTION Left 07-22-2013     SALPINGOOPHORECTOMY Bilateral 02/18/2015   Procedure: BILATERAL SALPINGO OOPHORECTOMY;  Surgeon: Osborn Coho, MD;  Location: [facility];  Service: Gynecology;  Laterality: Bilateral;   Patient Active Problem List   Diagnosis Date Noted   Dyspepsia 11/17/2022   Complete AV block 10/26/2022   Major osseous defect    Prediabetes 10/31/2021   Acquired hypothyroidism 10/31/2021   Acquired hallux interphalangeus, left    Metatarsus primus varus of right foot    Hallux valgus with bunions, left    Hammertoe of second toe of left foot    Atherosclerosis of aorta 09/14/2020   Hyperlipidemia 06/07/2020   Symptomatic bradycardia 03/12/2020   Mobitz II 03/12/2020   Osteopenia 12/02/2019   Bilateral low back pain 10/02/2019   Pelvic prolapse 02/18/2015    REFERRING DIAG: acute pain of right shoulder  THERAPY DIAG:  Chronic right shoulder pain  Cervicalgia  Muscle weakness (generalized)  Rationale for Evaluation and Treatment Rehabilitation  PERTINENT HISTORY: see PMH above   PRECAUTIONS: pacemaker (no E-stim)    SUBJECTIVE:                                                                                                                                                                                     In-person interpreter utilized  SUBJECTIVE STATEMENT:  Patient reports her shoulder is feeling a lot better. She can move her shoulder more. She reports mild pain in the shoulder, and does have a little more pain at night.  PAIN:  Are you having pain? Yes:  NPRS scale: 3/10 currently, 4/10 at night Pain location: Right upper trap  Pain description: "just hurts" Aggravating factors: Nocturnal pain, lifting Relieving factors:  Ibuprofen   OBJECTIVE: (objective measures completed at initial evaluation unless otherwise dated) PATIENT SURVEYS:  FOTO 47% functional status  12/22/2022: 74%  POSTURE: Rounded shoulder, forward head   UPPER  EXTREMITY ROM:    Active ROM Right eval Left eval 12/07/22 Right   Shoulder flexion 150 160 160  Shoulder extension       Shoulder abduction       Shoulder adduction       Shoulder internal rotation L1 T7 T9  Shoulder external rotation T1 T2 T2  Elbow flexion       Elbow extension       Wrist flexion       Wrist extension       Wrist ulnar deviation       Wrist radial deviation       Wrist pronation       Wrist supination       (Blank rows = not tested)   Passive right shoulder motion grossly WFL with pain at end range flexion and IR   UPPER EXTREMITY MMT:   MMT Right eval Left eval 12/12/22 Right   Shoulder flexion 4 5 4  pn  Shoulder extension       Shoulder abduction 4 5 5   Shoulder adduction       Shoulder internal rotation       Shoulder external rotation 4 5   Middle trapezius       Lower trapezius       Elbow flexion 5 5   Elbow extension 5 5   Wrist flexion       Wrist extension       Wrist ulnar deviation       Wrist radial deviation       Wrist pronation       Wrist supination       Grip strength (lbs)       (Blank rows = not tested)   Patient reports pain with all right shoulder motion   SHOULDER SPECIAL TESTS: Impingement tests: Hawkins/Kennedy impingement test: positive    PALPATION:  Global pain of right shoulder with palpation               TODAY'S TREATMENT:       [facility] Adult PT Treatment:                                                DATE: 12/22/22 Therapeutic Exercise: UBE L1 x 4 min (fwd/bwd) while taking subjective Row with blue 2 x 15 Extension with red 2 x 15 ER / IR with red 2 x 15 each Wall push-up 2 x 10 Wall slide x 10 Standing shoulder scaption with 3# 2 x 10 Standing shoulder abduction with 3# x 10   [facility]  Adult PT Treatment:                                                DATE: 12/12/22 Therapeutic Exercise: UBE level 2 x 2 min each fwd/bwd  Shoulder ER/IR red band 2 x 10  Resisted shoulder flexion 2 x 10 @ 2lbs  Resisted shoulder scaption 2 x 10 @ 2 lbs  Serratus wall slides 2 x 10  Resisted shoulder adduction 2 x 10; red band  Updated HEP   [facility] Adult PT Treatment:  DATE: 12/07/22 Therapeutic Exercise: UBE level 1.5 x 2 min each fwd/bwd  Sidelying horizontal shoulder abduction 2 x 10; 1# Sidelying ER 2 x 10; 1# Supine shoulder diagonals 2 x 10; yellow band  Standing horizontal shoulder abduction yellow band 2 x 10  Standing shoulder flexion 1# 2 x 10  Standing shoulder scaption 1# 2 x 10  Bilateral shoulder ER red band 2 x 10  Updated HEP   PATIENT EDUCATION: Education details: HEP, FOTO Person educated: Patient Education method: Explanation, Demonstration, Tactile cues, Verbal cues Education comprehension: verbalized understanding, returned demonstration, verbal cues required, tactile cues required, and needs further education   HOME EXERCISE PROGRAM: Access Code: [email or web address]     ASSESSMENT: CLINICAL IMPRESSION: Patient tolerated therapy well with no adverse effects. She reports great improvement with her functional ability achieving her FOTO goal. Therapy continues to focus primarily on strengthening for the right shoulder with good tolerance. She does exhibit some strength limitations with weight lifting compared to the left and requires occasional cueing to avoid shoulder shrug with exercises. No changes to HEP this visit. Patient would benefit from continued skilled PT to progress her mobility and strength in order to reduce pain and maximize functional ability.    OBJECTIVE IMPAIRMENTS: decreased activity tolerance, decreased ROM, decreased strength, postural dysfunction, and pain.    ACTIVITY LIMITATIONS: carrying, lifting,  sleeping, dressing, reach over head, and hygiene/grooming   PARTICIPATION LIMITATIONS: meal prep, cleaning, laundry, shopping, and occupation   PERSONAL FACTORS: Fitness, Past/current experiences, Time since onset of injury/illness/exacerbation, and 3+ comorbidities: see PMH noted above  are also affecting patient's functional outcome.      GOALS: Goals reviewed with patient? Yes   SHORT TERM GOALS: Target date: 12/05/2022   Patient will be I with initial HEP in order to progress with therapy. Baseline: HEP provided at eval Goal status: met    2.  PT will review FOTO with patient by 3rd visit in order to understand expected progress and outcome with therapy. Baseline: FOTO assessed at eval Goal status: met   3.  Patient will exhibit right shoulder flexion >/= 160 deg to improve overhead reach without limitation Baseline: 150 deg Goal status: met   LONG TERM GOALS: Target date: 01/02/2023   Patient will be I with final HEP to maintain progress from PT. Baseline: HEP provided at eval Goal status: INITIAL   2.  Patient will report >/= 64% status on FOTO to indicate improved functional ability. Baseline: 47% functional status 12/22/2022: 74% Goal status: MET   3.  Patient will exhibit right shoulder strength 5/5 MMT to improve lifting ability Baseline: grossly 4/5 MMT Goal status: partially met    4.  Patient will report right shoulder pain </= 4/10 with activity and at night to reduce functional limitations and improve sleeping Baseline: 9/10 pain Goal status: INITIAL   5.  Patient will demonstrate right shoulder IR reach behind back >/= T10 to improve grooming and dressing ability Baseline: reach to L1 Goal status: met      PLAN: PT FREQUENCY: 1x/week   PT DURATION: 8 weeks   PLANNED INTERVENTIONS: Therapeutic exercises, Therapeutic activity, Neuromuscular re-education, Balance training, Gait training, Patient/Family education, Self Care, Joint mobilization, Aquatic  Therapy, Dry Needling, Cryotherapy, Moist heat, Manual therapy, and Re-evaluation   PLAN FOR NEXT SESSION: Review HEP and progress PRN, progress rotator cuff and periscapular strengthening as tolerated     Rosana Hoes, PT, DPT, LAT, ATC 12/22/22  2:51 PM Phone: [phone] Fax: [phone]

## 2022-12-22 ENCOUNTER — Other Ambulatory Visit: Payer: Self-pay

## 2022-12-22 ENCOUNTER — Ambulatory Visit: Payer: 59 | Admitting: Physical Therapy

## 2022-12-22 ENCOUNTER — Encounter: Payer: Self-pay | Admitting: Physical Therapy

## 2022-12-22 DIAGNOSIS — M542 Cervicalgia: Secondary | ICD-10-CM

## 2022-12-22 DIAGNOSIS — M6281 Muscle weakness (generalized): Secondary | ICD-10-CM

## 2022-12-22 DIAGNOSIS — G8929 Other chronic pain: Secondary | ICD-10-CM

## 2022-12-22 DIAGNOSIS — M25511 Pain in right shoulder: Secondary | ICD-10-CM | POA: Diagnosis not present

## 2022-12-26 ENCOUNTER — Ambulatory Visit: Payer: 59

## 2022-12-26 DIAGNOSIS — M6281 Muscle weakness (generalized): Secondary | ICD-10-CM

## 2022-12-26 DIAGNOSIS — M542 Cervicalgia: Secondary | ICD-10-CM

## 2022-12-26 DIAGNOSIS — G8929 Other chronic pain: Secondary | ICD-10-CM | POA: Diagnosis not present

## 2022-12-26 DIAGNOSIS — M25511 Pain in right shoulder: Secondary | ICD-10-CM | POA: Diagnosis not present

## 2022-12-26 NOTE — Therapy (Signed)
OUTPATIENT PHYSICAL THERAPY TREATMENT NOTE   Patient Name: Tiffany Berg MRN: [id number] DOB:May 19, 1956, 67 y.o., female Today's Date: 12/26/2022  PCP: [place], Betty G, MD REFERRING PROVIDER: [place], Betty G, MD    END OF SESSION:   PT End of Session - 12/26/22 1406     Visit Number 7    Number of Visits 9    Date for PT Re-Evaluation 01/02/23    Authorization Type UHC / MCD    Progress Note Due on Visit 10    PT Start Time 1406    PT Stop Time 1445    PT Time Calculation (min) 39 min    Activity Tolerance Patient tolerated treatment well    Behavior During Therapy [facility] for tasks assessed/performed                  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   w/ symptomatic bradycardia, rate 30s;  s/p  PPM 03-14-2020   Myalgia, multiple sites    Pre-diabetes    Symptomatic bradycardia 03/12/2020   s/p PPM   Wears glasses    Wears partial dentures    upper and lower   Past Surgical History:  Procedure Laterality Date   Quintella Reichert OSTEOTOMY Right 10/15/2020   Procedure: Quintella Reichert OSTEOTOMY;  Surgeon: Edwin Cap, DPM;  Location: [facility] [place];  Service: Podiatry;  Laterality: Right;  REGIONAL BLOCK   AIKEN OSTEOTOMY Left 02/24/2022   Procedure: Quintella Reichert OSTEOTOMY;  Surgeon: Edwin Cap, DPM;  Location: [facility];  Service: Podiatry;  Laterality: Left;   ANTERIOR AND POSTERIOR REPAIR N/A 02/18/2015   Procedure: ANTERIOR (CYSTOCELE) AND POSTERIOR REPAIR (RECTOCELE);  Surgeon: Osborn Coho, MD;  Location: [facility];  Service: Gynecology;  Laterality: N/A;   BLADDER SUSPENSION N/A 02/18/2015   Procedure: TRANSVAGINAL TAPE (TVT) PROCEDURE;  Surgeon: Osborn Coho, MD;  Location: [facility];  Service: Gynecology;  Laterality: N/A;   CYSTOSCOPY Bilateral 02/18/2015   Procedure: CYSTOSCOPY;  Surgeon: Osborn Coho, MD;  Location: [facility];  Service: Gynecology;  Laterality: Bilateral;   GRAFT APPLICATION Left 02/24/2022   Procedure: BONE GRAFT;  Surgeon: Edwin Cap, DPM;  Location: [facility];  Service: Podiatry;  Laterality: Left;   HALLUX VALGUS LAPIDUS Right 10/15/2020   Procedure: HALLUX VALGUS LAPIDUS AND BONE GRAFT;  Surgeon: Edwin Cap, DPM;  Location: [facility] [place];  Service: Podiatry;  Laterality: Right;  WITH IV SEDATION; REGIONAL BLOCK   HALLUX VALGUS LAPIDUS Left 02/24/2022   Procedure: HALLUX VALGUS LAPIDUS;  Surgeon: Edwin Cap, DPM;  Location: [facility] [place];  Service: Podiatry;  Laterality: Left;   HAMMER TOE SURGERY Right 10/15/2020   Procedure: HAMMER TOE CORRECTION;  Surgeon: Edwin Cap, DPM;  Location: [facility];  Service: Podiatry;  Laterality: Right;  REGIONAL BLOCK   HAMMER TOE SURGERY Left 02/24/2022   Procedure: HAMMER TOE CORRECTION SECOND TOE LEFT FOOT;  Surgeon: Edwin Cap, DPM;  Location: [facility] [place];  Service: Podiatry;  Laterality: Left;   LAPAROSCOPIC ASSISTED VAGINAL HYSTERECTOMY N/A 02/18/2015   Procedure: LAPAROSCOPIC ASSISTED VAGINAL HYSTERECTOMY;  Surgeon: Osborn Coho, MD;  Location: [facility];  Service: Gynecology;  Laterality: N/A;   MASTECTOMY, PARTIAL Left 08-12-2013     excision another site   PACEMAKER IMPLANT N/A 03/14/2020   Procedure: PACEMAKER IMPLANT;   Surgeon: Regan Lemming, MD;  Location: [facility];  Service: Cardiovascular;  Laterality: N/A;   RADIOACTIVE SEED GUIDED PARTIAL MASTECTOMY/AXILLARY SENTINEL NODE BIOPSY/AXILLARY NODE DISSECTION Left 07-22-2013     SALPINGOOPHORECTOMY Bilateral 02/18/2015   Procedure: BILATERAL SALPINGO OOPHORECTOMY;  Surgeon: Osborn Coho, MD;  Location: [facility];  Service: Gynecology;  Laterality: Bilateral;   Patient Active Problem List   Diagnosis Date Noted   Dyspepsia 11/17/2022   Complete AV block 10/26/2022   Major osseous defect    Prediabetes 10/31/2021   Acquired hypothyroidism 10/31/2021   Acquired hallux interphalangeus, left    Metatarsus primus varus of right foot    Hallux valgus with bunions, left    Hammertoe of second toe of left foot    Atherosclerosis of aorta 09/14/2020   Hyperlipidemia 06/07/2020   Symptomatic bradycardia 03/12/2020   Mobitz II 03/12/2020   Osteopenia 12/02/2019   Bilateral low back pain 10/02/2019   Pelvic prolapse 02/18/2015    REFERRING DIAG: acute pain of right shoulder  THERAPY DIAG:  Chronic right shoulder pain  Cervicalgia  Muscle weakness (generalized)  Rationale for Evaluation and Treatment Rehabilitation  PERTINENT HISTORY: see PMH above   PRECAUTIONS: pacemaker (no E-stim)    SUBJECTIVE:                                                                                                                                                                                     In-person interpreter utilized  SUBJECTIVE STATEMENT:  "A lot better."   PAIN:  Are you having pain? Yes:  NPRS scale: 2/10 Pain location: Right posterior shoulder  Pain description: "needle." Aggravating factors: Nocturnal pain, lifting Relieving factors: Ibuprofen   OBJECTIVE: (objective measures completed at initial evaluation unless otherwise dated) PATIENT SURVEYS:  FOTO 47% functional status  12/22/2022: 74%  POSTURE: Rounded shoulder,  forward head   UPPER EXTREMITY ROM:    Active ROM Right eval Left eval 12/07/22 Right   Shoulder flexion 150 160 160  Shoulder extension       Shoulder abduction  Shoulder adduction       Shoulder internal rotation L1 T7 T9  Shoulder external rotation T1 T2 T2  Elbow flexion       Elbow extension       Wrist flexion       Wrist extension       Wrist ulnar deviation       Wrist radial deviation       Wrist pronation       Wrist supination       (Blank rows = not tested)   Passive right shoulder motion grossly WFL with pain at end range flexion and IR   UPPER EXTREMITY MMT:   MMT Right eval Left eval 12/12/22 Right  12/26/22 Right   Shoulder flexion 4 5 4  pn   Shoulder extension        Shoulder abduction 4 5 5    Shoulder adduction        Shoulder internal rotation      5  Shoulder external rotation 4 5  5   Middle trapezius        Lower trapezius        Elbow flexion 5 5    Elbow extension 5 5    Wrist flexion        Wrist extension        Wrist ulnar deviation        Wrist radial deviation        Wrist pronation        Wrist supination        Grip strength (lbs)        (Blank rows = not tested)   Patient reports pain with all right shoulder motion   SHOULDER SPECIAL TESTS: Impingement tests: Hawkins/Kennedy impingement test: positive    PALPATION:  Global pain of right shoulder with palpation               TODAY'S TREATMENT:       [facility] Adult PT Treatment:                                                DATE: 12/26/22 Therapeutic Exercise: UBE level 2 x 2 min each fwd/bwd  Prone scapular retraction 2 x 10  Prone T 2 x 10  Resisted rows 10 lbs 2 x 10  Resisted shoulder extension green band 2 x 10  Curl to overhead press 2 x 10; 2 #  Shoulder diagonals yellow band 2 x 10     [facility] Adult PT Treatment:                                                DATE: 12/22/22 Therapeutic Exercise: UBE L1 x 4 min (fwd/bwd) while taking subjective Row with blue 2 x  15 Extension with red 2 x 15 ER / IR with red 2 x 15 each Wall push-up 2 x 10 Wall slide x 10 Standing shoulder scaption with 3# 2 x 10 Standing shoulder abduction with 3# x 10   [facility] Adult PT Treatment:  DATE: 12/12/22 Therapeutic Exercise: UBE level 2 x 2 min each fwd/bwd  Shoulder ER/IR red band 2 x 10  Resisted shoulder flexion 2 x 10 @ 2lbs  Resisted shoulder scaption 2 x 10 @ 2 lbs  Serratus wall slides 2 x 10  Resisted shoulder adduction 2 x 10; red band  Updated HEP   [facility] Adult PT Treatment:                                                DATE: 12/07/22 Therapeutic Exercise: UBE level 1.5 x 2 min each fwd/bwd  Sidelying horizontal shoulder abduction 2 x 10; 1# Sidelying ER 2 x 10; 1# Supine shoulder diagonals 2 x 10; yellow band  Standing horizontal shoulder abduction yellow band 2 x 10  Standing shoulder flexion 1# 2 x 10  Standing shoulder scaption 1# 2 x 10  Bilateral shoulder ER red band 2 x 10  Updated HEP   PATIENT EDUCATION: Education details: HEP Person educated: Patient Education method: Programmer, multimedia, Demonstration, Actor cues, Verbal cues Education comprehension: verbalized understanding, returned demonstration, verbal cues required, tactile cues required, and needs further education   HOME EXERCISE PROGRAM: Access Code: [email or web address]     ASSESSMENT: CLINICAL IMPRESSION: Patient arrives with minimal Rt shoulder pain, continuing to report an overall improvement in pain and function. Introduced prone periscapular strengthening with patient requiring heavy cues initially for proper muscle activation and to reduce shoulder shrug. With continue practice and cues she is able to properly perform. She has improved postural awareness with standing strengthening requiring minimal cues intermittently to prevent shoulder shrug. She demonstrates full and pain free rotator cuff strength. She reported no pain at conclusion of  session.     OBJECTIVE IMPAIRMENTS: decreased activity tolerance, decreased ROM, decreased strength, postural dysfunction, and pain.    ACTIVITY LIMITATIONS: carrying, lifting, sleeping, dressing, reach over head, and hygiene/grooming   PARTICIPATION LIMITATIONS: meal prep, cleaning, laundry, shopping, and occupation   PERSONAL FACTORS: Fitness, Past/current experiences, Time since onset of injury/illness/exacerbation, and 3+ comorbidities: see PMH noted above  are also affecting patient's functional outcome.      GOALS: Goals reviewed with patient? Yes   SHORT TERM GOALS: Target date: 12/05/2022   Patient will be I with initial HEP in order to progress with therapy. Baseline: HEP provided at eval Goal status: met    2.  PT will review FOTO with patient by 3rd visit in order to understand expected progress and outcome with therapy. Baseline: FOTO assessed at eval Goal status: met   3.  Patient will exhibit right shoulder flexion >/= 160 deg to improve overhead reach without limitation Baseline: 150 deg Goal status: met   LONG TERM GOALS: Target date: 01/02/2023   Patient will be I with final HEP to maintain progress from PT. Baseline: HEP provided at eval Goal status: INITIAL   2.  Patient will report >/= 64% status on FOTO to indicate improved functional ability. Baseline: 47% functional status 12/22/2022: 74% Goal status: MET   3.  Patient will exhibit right shoulder strength 5/5 MMT to improve lifting ability Baseline: grossly 4/5 MMT Goal status: partially met    4.  Patient will report right shoulder pain </= 4/10 with activity and at night to reduce functional limitations and improve sleeping Baseline: 9/10 pain Goal status: INITIAL   5.  Patient will demonstrate right  shoulder IR reach behind back >/= T10 to improve grooming and dressing ability Baseline: reach to L1 Goal status: met      PLAN: PT FREQUENCY: 1x/week   PT DURATION: 8 weeks   PLANNED  INTERVENTIONS: Therapeutic exercises, Therapeutic activity, Neuromuscular re-education, Balance training, Gait training, Patient/Family education, Self Care, Joint mobilization, Aquatic Therapy, Dry Needling, Cryotherapy, Moist heat, Manual therapy, and Re-evaluation   PLAN FOR NEXT SESSION: Review HEP and progress PRN, progress rotator cuff and periscapular strengthening as tolerated; anticipate d/c     Letitia Libra, PT, DPT, ATC 12/26/22 2:46 PM

## 2023-01-02 ENCOUNTER — Ambulatory Visit: Payer: 59 | Admitting: Physical Therapy

## 2023-01-17 NOTE — Therapy (Signed)
OUTPATIENT PHYSICAL THERAPY TREATMENT NOTE  DISCHARGE   Patient Name: Tiffany Berg MRN: [id number] DOB:Feb 21, 1956, 67 y.o., female Today's Date: 01/18/2023  PCP: [place], Betty G, MD REFERRING PROVIDER: [place], Betty G, MD    END OF SESSION:   PT End of Session - 01/18/23 1407     Visit Number 8    Number of Visits 9    Date for PT Re-Evaluation 01/18/23    Authorization Type UHC / MCD    Progress Note Due on Visit 10    PT Start Time 1407    PT Stop Time 1445    PT Time Calculation (min) 38 min    Activity Tolerance Patient tolerated treatment well    Behavior During Therapy [facility] for tasks assessed/performed                   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   w/ symptomatic bradycardia, rate 30s;  s/p  PPM 03-14-2020   Myalgia, multiple sites    Pre-diabetes    Symptomatic bradycardia 03/12/2020   s/p PPM   Wears glasses    Wears partial dentures    upper and lower   Past Surgical History:  Procedure Laterality Date   Quintella Reichert OSTEOTOMY Right 10/15/2020   Procedure: Quintella Reichert OSTEOTOMY;  Surgeon: Edwin Cap, DPM;  Location: [facility] [place];  Service: Podiatry;  Laterality: Right;  REGIONAL BLOCK   AIKEN OSTEOTOMY Left 02/24/2022   Procedure: Quintella Reichert OSTEOTOMY;  Surgeon: Edwin Cap, DPM;   Location: [facility];  Service: Podiatry;  Laterality: Left;   ANTERIOR AND POSTERIOR REPAIR N/A 02/18/2015   Procedure: ANTERIOR (CYSTOCELE) AND POSTERIOR REPAIR (RECTOCELE);  Surgeon: Osborn Coho, MD;  Location: [facility];  Service: Gynecology;  Laterality: N/A;   BLADDER SUSPENSION N/A 02/18/2015   Procedure: TRANSVAGINAL TAPE (TVT) PROCEDURE;  Surgeon: Osborn Coho, MD;  Location: [facility];  Service: Gynecology;  Laterality: N/A;   CYSTOSCOPY Bilateral 02/18/2015   Procedure: CYSTOSCOPY;  Surgeon: Osborn Coho, MD;  Location: [facility];  Service: Gynecology;  Laterality: Bilateral;   GRAFT APPLICATION Left 02/24/2022   Procedure: BONE GRAFT;  Surgeon: Edwin Cap, DPM;  Location: [facility] [place];  Service: Podiatry;  Laterality: Left;   HALLUX VALGUS LAPIDUS Right 10/15/2020   Procedure: HALLUX VALGUS LAPIDUS AND BONE GRAFT;  Surgeon: Edwin Cap, DPM;  Location: [facility];  Service: Podiatry;  Laterality: Right;  WITH IV SEDATION; REGIONAL BLOCK   HALLUX VALGUS LAPIDUS Left 02/24/2022   Procedure: HALLUX VALGUS LAPIDUS;  Surgeon: Edwin Cap, DPM;  Location: [facility] ;  Service: Podiatry;  Laterality: Left;   HAMMER TOE SURGERY Right 10/15/2020   Procedure: HAMMER TOE CORRECTION;  Surgeon: Edwin Cap, DPM;  Location:  [facility];  Service: Podiatry;  Laterality: Right;  REGIONAL BLOCK   HAMMER TOE SURGERY Left 02/24/2022   Procedure: HAMMER TOE CORRECTION SECOND TOE LEFT FOOT;  Surgeon: Edwin Cap, DPM;  Location: [facility];  Service: Podiatry;  Laterality: Left;   LAPAROSCOPIC ASSISTED VAGINAL HYSTERECTOMY N/A 02/18/2015   Procedure: LAPAROSCOPIC ASSISTED VAGINAL HYSTERECTOMY;  Surgeon: Osborn Coho, MD;  Location: [facility];  Service: Gynecology;  Laterality: N/A;   MASTECTOMY, PARTIAL Left 08-12-2013  @[facility]    excision another site   PACEMAKER IMPLANT N/A 03/14/2020   Procedure:  PACEMAKER IMPLANT;  Surgeon: Regan Lemming, MD;  Location: [facility];  Service: Cardiovascular;  Laterality: N/A;   RADIOACTIVE SEED GUIDED PARTIAL MASTECTOMY/AXILLARY SENTINEL NODE BIOPSY/AXILLARY NODE DISSECTION Left 07-22-2013  @[facility]    SALPINGOOPHORECTOMY Bilateral 02/18/2015   Procedure: BILATERAL SALPINGO OOPHORECTOMY;  Surgeon: Osborn Coho, MD;  Location: [facility];  Service: Gynecology;  Laterality: Bilateral;   Patient Active Problem List   Diagnosis Date Noted   Dyspepsia 11/17/2022   Complete AV block (HCC) 10/26/2022   Major osseous defect    Prediabetes 10/31/2021   Acquired hypothyroidism 10/31/2021   Acquired hallux interphalangeus, left    Metatarsus primus varus of right foot    Hallux valgus with bunions, left    Hammertoe of second toe of left foot    Atherosclerosis of aorta (HCC) 09/14/2020   Hyperlipidemia 06/07/2020   Symptomatic bradycardia 03/12/2020   Mobitz II 03/12/2020   Osteopenia 12/02/2019   Bilateral low back pain 10/02/2019   Pelvic prolapse 02/18/2015    REFERRING DIAG: acute pain of right shoulder  THERAPY DIAG:  Chronic right shoulder pain  Cervicalgia  Muscle weakness (generalized)  Rationale for Evaluation and Treatment Rehabilitation  PERTINENT HISTORY: see PMH above   PRECAUTIONS: pacemaker (no E-stim)    SUBJECTIVE:                                                                                                                                                                                     In-person interpreter utilized  SUBJECTIVE STATEMENT:  Patient reports she is feeling good and has been doing her exercises.  PAIN:  Are you having pain? Yes:  NPRS scale: 0/10 Pain location: Right posterior shoulder  Pain description: "needle." Aggravating factors: Nocturnal pain, lifting Relieving factors: Ibuprofen   OBJECTIVE: (objective measures completed at initial evaluation unless otherwise dated) PATIENT  SURVEYS:  FOTO 47% functional status  12/22/2022: 74%  POSTURE: Rounded shoulder, forward head   UPPER EXTREMITY ROM:    Active ROM Right eval Left eval 12/07/22 Right  01/18/2023  Shoulder flexion 150 160 160  Shoulder extension        Shoulder abduction        Shoulder adduction        Shoulder internal rotation L1 T7 T9 T10  Shoulder external rotation T1 T2 T2   Elbow flexion        Elbow extension        Wrist flexion        Wrist extension        Wrist ulnar deviation        Wrist radial deviation        Wrist pronation        Wrist supination        (Blank rows = not tested)   Passive right shoulder motion grossly WFL with pain at end range flexion and IR   UPPER EXTREMITY MMT:   MMT Right eval Left eval 12/12/22 Right  12/26/22 Right  01/18/2023 Right  Shoulder flexion 4 5 4  pn  5  Shoulder extension         Shoulder abduction 4 5 5     Shoulder adduction         Shoulder internal rotation      5 5  Shoulder external rotation 4 5  5 5   Middle trapezius         Lower trapezius         Elbow flexion 5 5     Elbow extension 5 5     Wrist flexion         Wrist extension         Wrist ulnar deviation         Wrist radial deviation         Wrist pronation         Wrist supination         Grip strength (lbs)         (Blank rows = not tested)   Patient reports pain with all right shoulder motion   SHOULDER SPECIAL TESTS: Impingement tests: Hawkins/Kennedy impingement test: positive    PALPATION:  Global pain of right shoulder with palpation               TODAY'S TREATMENT:       [facility] Adult PT Treatment:                                                DATE: 01/18/23 Therapeutic Exercise: UBE L4 x 4 min each fwd/bwd while taking subjective Row with blue 2 x 20 Extension with green 2 x 20 ER / IR with red 2 x 15 each Wall push-up 2 x 10 Standing shoulder scaption with 3# 2 x 10 Standing shoulder abduction with 3# 2 x 10 Seated horizontal abduction with  yellow 2 x 15 Seated diagonals with yellow 2 x 10 each Seated upper trap and levator scap stretch 2 x 15 sec each   [facility] Adult PT Treatment:                                                DATE: 12/26/22 Therapeutic Exercise: UBE level 2 x 2 min each fwd/bwd  Prone scapular retraction 2 x 10  Prone T 2 x 10  Resisted rows 10 lbs 2 x 10  Resisted shoulder extension green band 2 x 10  Curl to overhead press 2 x 10; 2 #  Shoulder diagonals yellow band 2 x 10   [facility] Adult PT Treatment:                                                DATE: 12/22/22 Therapeutic Exercise: UBE L1 x 4 min (fwd/bwd) while taking subjective Row with blue 2 x 15 Extension with red 2 x 15 ER / IR with red 2 x 15 each Wall push-up 2 x 10 Wall slide x 10 Standing shoulder scaption with 3# 2 x 10 Standing shoulder abduction with 3# x 10  PATIENT EDUCATION: Education details: POC discharge, HEP Person educated: Patient Education method: Explanation Education comprehension: Verbalized understanding   HOME EXERCISE PROGRAM: Access Code: [email or web address]     ASSESSMENT: CLINICAL IMPRESSION: Patient tolerated therapy well with no adverse effects. She has achieved all established goals and is independent with her HEP. No changes made to her HEP this visit. Patient will be formally discharged from PT.    OBJECTIVE IMPAIRMENTS: decreased activity tolerance, decreased ROM, decreased strength, postural dysfunction, and pain.    ACTIVITY LIMITATIONS: carrying, lifting, sleeping, dressing, reach over head, and hygiene/grooming   PARTICIPATION LIMITATIONS: meal prep, cleaning, laundry, shopping, and occupation   PERSONAL FACTORS: Fitness, Past/current experiences, Time since onset of injury/illness/exacerbation, and 3+ comorbidities: see PMH noted above  are also affecting patient's functional outcome.      GOALS: Goals reviewed with patient? Yes   SHORT TERM GOALS: Target date: 12/05/2022   Patient will be I with  initial HEP in order to progress with therapy. Baseline: HEP provided at eval Goal status: met    2.  PT will review FOTO with patient by 3rd visit in order to understand expected progress and outcome with therapy. Baseline: FOTO assessed at eval Goal status: met   3.  Patient will exhibit right shoulder flexion >/= 160 deg to improve overhead reach without limitation Baseline: 150 deg Goal status: met   LONG TERM GOALS: Target date: 01/18/2023   Patient will be I with final HEP to maintain progress from PT. Baseline: HEP provided at eval 01/18/2023: independent Goal status: met   2.  Patient will report >/= 64% status on FOTO to indicate improved functional ability. Baseline: 47% functional status 12/22/2022: 74% Goal status: MET   3.  Patient will exhibit right shoulder strength 5/5 MMT to improve lifting ability Baseline: grossly 4/5 MMT 01/18/2023: 5/5 MMT Goal status: met    4.  Patient will report right shoulder pain </= 4/10 with activity and at night to reduce functional limitations and improve sleeping Baseline: 9/10 pain 01/18/2023: 0/10 pain Goal status: met   5.  Patient will demonstrate right shoulder IR reach behind back >/= T10 to improve grooming and dressing ability Baseline: reach to L1 01/18/2023: reach to T10 Goal status: met      PLAN: PT FREQUENCY: 1x/week   PT DURATION: 8 weeks   PLANNED INTERVENTIONS: Therapeutic exercises, Therapeutic activity, Neuromuscular re-education, Balance training, Gait training, Patient/Family education, Self Care, Joint mobilization, Aquatic Therapy, Dry Needling, Cryotherapy, Moist heat, Manual therapy, and Re-evaluation   PLAN FOR NEXT SESSION: NA - discharge    Rosana Hoes, PT, DPT, LAT, ATC 01/18/23  2:47 PM Phone: [phone]  Fax: [phone]   PHYSICAL THERAPY DISCHARGE SUMMARY  Visits from Start of Care: 8  Current functional level related to goals / functional outcomes: See above   Remaining  deficits: See above   Education / Equipment: HEP   Patient agrees to discharge. Patient goals were met. Patient is being discharged due to meeting the stated rehab goals.

## 2023-01-18 ENCOUNTER — Ambulatory Visit: Payer: 59 | Attending: Family Medicine | Admitting: Physical Therapy

## 2023-01-18 ENCOUNTER — Encounter: Payer: Self-pay | Admitting: Physical Therapy

## 2023-01-18 ENCOUNTER — Other Ambulatory Visit: Payer: Self-pay

## 2023-01-18 DIAGNOSIS — M6281 Muscle weakness (generalized): Secondary | ICD-10-CM | POA: Insufficient documentation

## 2023-01-18 DIAGNOSIS — M542 Cervicalgia: Secondary | ICD-10-CM | POA: Insufficient documentation

## 2023-01-18 DIAGNOSIS — M25511 Pain in right shoulder: Secondary | ICD-10-CM | POA: Insufficient documentation

## 2023-01-18 DIAGNOSIS — G8929 Other chronic pain: Secondary | ICD-10-CM | POA: Insufficient documentation

## 2023-01-19 ENCOUNTER — Ambulatory Visit (INDEPENDENT_AMBULATORY_CARE_PROVIDER_SITE_OTHER): Payer: 59 | Admitting: Family Medicine

## 2023-01-19 ENCOUNTER — Ambulatory Visit (INDEPENDENT_AMBULATORY_CARE_PROVIDER_SITE_OTHER): Payer: 59

## 2023-01-19 ENCOUNTER — Encounter: Payer: Self-pay | Admitting: Family Medicine

## 2023-01-19 VITALS — BP 128/80 | HR 82 | Temp 98.0°F | Resp 16 | Ht 63.0 in | Wt 155.0 lb

## 2023-01-19 DIAGNOSIS — J029 Acute pharyngitis, unspecified: Secondary | ICD-10-CM | POA: Diagnosis not present

## 2023-01-19 DIAGNOSIS — Z1239 Encounter for other screening for malignant neoplasm of breast: Secondary | ICD-10-CM

## 2023-01-19 DIAGNOSIS — R040 Epistaxis: Secondary | ICD-10-CM

## 2023-01-19 DIAGNOSIS — R059 Cough, unspecified: Secondary | ICD-10-CM | POA: Diagnosis not present

## 2023-01-19 DIAGNOSIS — J069 Acute upper respiratory infection, unspecified: Secondary | ICD-10-CM | POA: Diagnosis not present

## 2023-01-19 DIAGNOSIS — I442 Atrioventricular block, complete: Secondary | ICD-10-CM | POA: Diagnosis not present

## 2023-01-19 DIAGNOSIS — E785 Hyperlipidemia, unspecified: Secondary | ICD-10-CM | POA: Diagnosis not present

## 2023-01-19 LAB — CUP PACEART REMOTE DEVICE CHECK
Battery Remaining Longevity: 96 mo
Battery Voltage: 2.99 V
Brady Statistic AP VP Percent: 27.66 %
Brady Statistic AP VS Percent: 1.65 %
Brady Statistic AS VP Percent: 48.37 %
Brady Statistic AS VS Percent: 22.32 %
Brady Statistic RA Percent Paced: 29.31 %
Brady Statistic RV Percent Paced: 76.03 %
Date Time Interrogation Session: 20240516211247
Implantable Lead Connection Status: 753985
Implantable Lead Connection Status: 753985
Implantable Lead Implant Date: 20210711
Implantable Lead Implant Date: 20210711
Implantable Lead Location: 753859
Implantable Lead Location: 753860
Implantable Lead Model: 5076
Implantable Lead Model: 5076
Implantable Pulse Generator Implant Date: 20210711
Lead Channel Impedance Value: 323 Ohm
Lead Channel Impedance Value: 361 Ohm
Lead Channel Impedance Value: 361 Ohm
Lead Channel Impedance Value: 532 Ohm
Lead Channel Pacing Threshold Amplitude: 0.625 V
Lead Channel Pacing Threshold Amplitude: 0.75 V
Lead Channel Pacing Threshold Pulse Width: 0.4 ms
Lead Channel Pacing Threshold Pulse Width: 0.4 ms
Lead Channel Sensing Intrinsic Amplitude: 3.875 mV
Lead Channel Sensing Intrinsic Amplitude: 3.875 mV
Lead Channel Sensing Intrinsic Amplitude: 4.125 mV
Lead Channel Sensing Intrinsic Amplitude: 4.125 mV
Lead Channel Setting Pacing Amplitude: 1.5 V
Lead Channel Setting Pacing Amplitude: 2.5 V
Lead Channel Setting Pacing Pulse Width: 0.4 ms
Lead Channel Setting Sensing Sensitivity: 1.2 mV
Zone Setting Status: 755011
Zone Setting Status: 755011

## 2023-01-19 LAB — POC COVID19 BINAXNOW: SARS Coronavirus 2 Ag: NEGATIVE

## 2023-01-19 LAB — POCT RAPID STREP A (OFFICE): Rapid Strep A Screen: NEGATIVE

## 2023-01-19 NOTE — Progress Notes (Unsigned)
ACUTE VISIT Chief Complaint  Patient presents with   Cough   Nasal Congestion   Fatigue   HPI: [name] is a 67 y.o. female, who is here today complaining of nasal congestion, throat discomfort, and episodes of coughing up dark, blood-tinged phlegm, which she describes as old-looking blood clots for about a week. She also reports occasional headaches, no more than usual, for which she takes OTC medication, and denies fever or body aches. Cough Associated symptoms include nasal congestion, postnasal drip and rhinorrhea. Pertinent negatives include no chest pain, chills, fever, heartburn, myalgias, rash, sweats, weight loss or wheezing.   She continue having episodes of epistaxis, particularly severe in the mornings. She was referred to ENT but states that she has not received information about appt. She also reports some difficulty in swallowing, describing a sensation of throat narrowing, especially when eating.  She has a history of breast cancer, ductal carcinoma in situ diagnosed about 5-6 years ago, for which she took medication for a year. She has been doing her mammograms in [place], [place], she would like an order to have it done here in [place].   She has not been taking any medications for her current respiratory symptoms. She recalls a referral to an ENT specialist that was not followed through and expresses a need for assistance due to language barriers during medical appointments. HLD: She is not on Lovastatin 40 mg, she discontinued it  due to muscle pain.  Lab Results  Component Value Date   CHOL 232 (H) 10/14/2020   HDL 52.30 10/14/2020   LDLCALC 160 (H) 10/14/2020   TRIG 101.0 10/14/2020   CHOLHDL 4 10/14/2020    Review of Systems  Constitutional:  Negative for chills, fever and weight loss.  HENT:  Positive for postnasal drip and rhinorrhea.   Respiratory:  Positive for cough. Negative for wheezing.   Cardiovascular:  Negative for chest pain.   Gastrointestinal:  Negative for heartburn.  Musculoskeletal:  Negative for myalgias.  Skin:  Negative for rash.  See other pertinent positives and negatives in HPI.  Current Outpatient Medications on File Prior to Visit  Medication Sig Dispense Refill   B Complex Vitamins (VITAMIN B COMPLEX PO) Take by mouth.     levothyroxine (SYNTHROID) 75 MCG tablet Take 1 tablet (75 mcg total) by mouth daily before breakfast. 90 tablet 2   pantoprazole (PROTONIX) 40 MG tablet Take 1 tablet (40 mg total) by mouth daily. 30 tablet 1   tiZANidine (ZANAFLEX) 2 MG tablet Take 1-2 tablets (2-4 mg total) by mouth every 6 (six) hours as needed for muscle spasms. 60 tablet 1   fluticasone (FLONASE) 50 MCG/ACT nasal spray Place 1 spray into both nostrils 2 (two) times daily. 16 g 0   gabapentin (NEURONTIN) 300 MG capsule Take 1 capsule (300 mg total) by mouth 3 (three) times daily for 7 days. 21 capsule 0   No current facility-administered medications on file prior to visit.   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   w/ symptomatic bradycardia, rate 30s;  s/p  PPM 03-14-2020   Myalgia, multiple sites    Pre-diabetes    Symptomatic bradycardia 03/12/2020   s/p PPM   Wears glasses    Wears partial dentures    upper and lower   No Known Allergies  Social History    Socioeconomic History   Marital status: Legally Separated    Spouse name: Not on file   Number of children: Not on file   Years of education: Not on file   Highest education level: Not on file  Occupational History   Not on file  Tobacco Use   Smoking status: Never   Smokeless tobacco: Never  Vaping Use   Vaping Use: Never used  Substance and Sexual Activity   Alcohol use: Yes    Comment: socially   Drug use: Never   Sexual activity: Yes    Birth control/protection: Post-menopausal, Surgical  Other Topics Concern   Not on file  Social History Narrative   Not on file   Social Determinants of Health   Financial Resource Strain: Low Risk  (02/22/2022)   Overall Financial Resource Strain (CARDIA)    Difficulty of Paying Living Expenses: Not hard at all  Food Insecurity: No Food Insecurity (02/22/2022)   Hunger Vital Sign    Worried About Running Out of Food in the Last Year: Never true    Ran Out of Food in the Last Year: Never true  Transportation Needs: No Transportation Needs (02/22/2022)   PRAPARE - Administrator, Civil Service (Medical): No    Lack of Transportation (Non-Medical): No  Physical Activity: Insufficiently Active (02/22/2022)   Exercise Vital Sign    Days of Exercise per Week: 2 days    Minutes of Exercise per Session: 60 min  Stress: No Stress Concern Present (02/22/2022)   [employer] of Occupational Health - Occupational Stress Questionnaire    Feeling of Stress : Not at all  Social Connections: Unknown (02/22/2022)   Social Connection and Isolation Panel [NHANES]    Frequency of Communication with Friends and Family: More than three times a week    Frequency of Social Gatherings with Friends and Family: More than three times a week    Attends Religious Services: Not on file    Active Member of Clubs or Organizations: Yes    Attends Banker Meetings: More than 4 times per year    Marital Status: Separated    Vitals:    01/19/23 1148  BP: 128/80  Pulse: 82  Resp: 16  Temp: 98 F (36.7 C)  SpO2: 98%   Body mass index is 27.46 kg/m.  Physical Exam Constitutional:      General: She is not in acute distress.    Appearance: She is well-developed. She is not ill-appearing.  HENT:     Head: Atraumatic.     Right Ear: Tympanic membrane, ear canal and external ear normal.     Left Ear: Tympanic membrane, ear canal and external ear normal.     Nose: Rhinorrhea present.     Left Nostril: No epistaxis.     Comments: Right septum with traces of blood. Eyes:     Conjunctiva/sclera: Conjunctivae normal.  Cardiovascular:  Rate and Rhythm: Normal rate and regular rhythm.     Heart sounds: No murmur heard. Pulmonary:     Effort: Pulmonary effort is normal. No respiratory distress.     Breath sounds: Normal breath sounds. No stridor.  Musculoskeletal:     Cervical back: No edema or erythema. No muscular tenderness.  Lymphadenopathy:     Head:     Right side of head: No submandibular adenopathy.     Left side of head: No submandibular adenopathy.     Cervical: No cervical adenopathy.  Skin:    General: Skin is warm.     Findings: No erythema or rash.  Neurological:     Mental Status: She is alert and oriented to person, place, and time.  Psychiatric:        Mood and Affect: Affect normal. Mood is anxious.     Comments: Well groomed, good eye contact.   ASSESSMENT AND PLAN: Sore throat -     POCT rapid strep A  URI, acute  Cough, unspecified type -     POC COVID-19 BinaxNow  Epistaxis due to trauma -     Ambulatory referral to ENT  Encounter for screening for malignant neoplasm of breast, unspecified screening modality -     3D Screening Mammogram, Left and Right; Future  Hyperlipidemia, unspecified hyperlipidemia type Assessment & Plan: She is not on Lovastatin, it caused muscle aches. For now continue non pharmacologic treatment, we will re-check FLP next visit.    Return in about  4 months (around 05/22/2023) for CPE, Labs.  Kathleena Freeman G. [place], MD  [facility]. [place] office.

## 2023-01-19 NOTE — Patient Instructions (Addendum)
A few things to remember from today's visit:  Sore throat - Plan: POC Rapid Strep A  URI, acute  Cough, unspecified type - Plan: POC COVID-19  Epistaxis due to trauma - Plan: Ambulatory referral to ENT  Encounter for screening for malignant neoplasm of breast, unspecified screening modality - Plan: MM 3D SCREENING MAMMOGRAM BILATERAL BREAST  Mucinex (no DM o D). Hydratacion. Irrigationes de la nariz con agua salina.  [phone] para el mamograma.

## 2023-01-19 NOTE — Assessment & Plan Note (Signed)
She is not on Lovastatin, it caused muscle aches. For now continue non pharmacologic treatment, we will re-check FLP next visit.

## 2023-01-23 DIAGNOSIS — R69 Illness, unspecified: Secondary | ICD-10-CM | POA: Diagnosis not present

## 2023-01-31 NOTE — Progress Notes (Signed)
Remote pacemaker transmission.   

## 2023-02-07 ENCOUNTER — Ambulatory Visit (INDEPENDENT_AMBULATORY_CARE_PROVIDER_SITE_OTHER): Payer: 59 | Admitting: Medical

## 2023-02-07 ENCOUNTER — Ambulatory Visit (HOSPITAL_BASED_OUTPATIENT_CLINIC_OR_DEPARTMENT_OTHER)
Admission: RE | Admit: 2023-02-07 | Discharge: 2023-02-07 | Disposition: A | Discharge status: Home / Self Care | Payer: 59 | Source: Ambulatory Visit | Attending: Medical | Admitting: Medical

## 2023-02-07 VITALS — BP 114/52 | HR 69 | Temp 98.2°F | Resp 18 | Ht 63.0 in | Wt 149.0 lb

## 2023-02-07 DIAGNOSIS — R059 Cough, unspecified: Secondary | ICD-10-CM | POA: Diagnosis not present

## 2023-02-07 DIAGNOSIS — D72829 Elevated white blood cell count, unspecified: Secondary | ICD-10-CM

## 2023-02-07 DIAGNOSIS — J029 Acute pharyngitis, unspecified: Secondary | ICD-10-CM

## 2023-02-07 DIAGNOSIS — R110 Nausea: Secondary | ICD-10-CM | POA: Diagnosis not present

## 2023-02-07 DIAGNOSIS — R1013 Epigastric pain: Secondary | ICD-10-CM

## 2023-02-07 DIAGNOSIS — R509 Fever, unspecified: Secondary | ICD-10-CM | POA: Insufficient documentation

## 2023-02-07 DIAGNOSIS — R197 Diarrhea, unspecified: Secondary | ICD-10-CM | POA: Diagnosis not present

## 2023-02-07 DIAGNOSIS — R52 Pain, unspecified: Secondary | ICD-10-CM

## 2023-02-07 DIAGNOSIS — E785 Hyperlipidemia, unspecified: Secondary | ICD-10-CM | POA: Diagnosis not present

## 2023-02-07 DIAGNOSIS — I69354 Hemiplegia and hemiparesis following cerebral infarction affecting left non-dominant side: Secondary | ICD-10-CM | POA: Diagnosis not present

## 2023-02-07 DIAGNOSIS — I25118 Atherosclerotic heart disease of native coronary artery with other forms of angina pectoris: Secondary | ICD-10-CM | POA: Diagnosis not present

## 2023-02-07 DIAGNOSIS — J449 Chronic obstructive pulmonary disease, unspecified: Secondary | ICD-10-CM | POA: Diagnosis not present

## 2023-02-07 DIAGNOSIS — I10 Essential (primary) hypertension: Secondary | ICD-10-CM | POA: Diagnosis not present

## 2023-02-07 LAB — CBC WITH DIFFERENTIAL/PLATELET
Absolute Monocytes: 795 cells/uL (ref 200–950)
Basophils Absolute: 28 cells/uL (ref 0–200)
Basophils Relative: 0.2 %
Eosinophils Absolute: 0 cells/uL — ABNORMAL LOW (ref 15–500)
Eosinophils Relative: 0 %
HCT: 45.6 % — ABNORMAL HIGH (ref 35.0–45.0)
Hemoglobin: 15.5 g/dL (ref 11.7–15.5)
Lymphs Abs: 1775 cells/uL (ref 850–3900)
MCH: 30.7 pg (ref 27.0–33.0)
MCHC: 34 g/dL (ref 32.0–36.0)
MCV: 90.3 fL (ref 80.0–100.0)
MPV: 10.4 fL (ref 7.5–12.5)
Monocytes Relative: 5.6 %
Neutro Abs: 11601 cells/uL — ABNORMAL HIGH (ref 1500–7800)
Neutrophils Relative %: 81.7 %
Platelets: 282 10*3/uL (ref 140–400)
RBC: 5.05 10*6/uL (ref 3.80–5.10)
RDW: 13.5 % (ref 11.0–15.0)
Total Lymphocyte: 12.5 %
WBC: 14.2 10*3/uL — ABNORMAL HIGH (ref 3.8–10.8)

## 2023-02-07 LAB — COMPREHENSIVE METABOLIC PANEL
AG Ratio: 1.6 (calc) (ref 1.0–2.5)
ALT: 20 U/L (ref 6–29)
AST: 21 U/L (ref 10–35)
Albumin: 4.6 g/dL (ref 3.6–5.1)
Alkaline phosphatase (APISO): 99 U/L (ref 37–153)
BUN/Creatinine Ratio: 14 (calc) (ref 6–22)
BUN: 16 mg/dL (ref 7–25)
CO2: 29 mmol/L (ref 20–32)
Calcium: 9.6 mg/dL (ref 8.6–10.4)
Chloride: 98 mmol/L (ref 98–110)
Creat: 1.13 mg/dL — ABNORMAL HIGH (ref 0.50–1.05)
Globulin: 2.9 g/dL (calc) (ref 1.9–3.7)
Glucose, Bld: 175 mg/dL — ABNORMAL HIGH (ref 65–99)
Potassium: 3.9 mmol/L (ref 3.5–5.3)
Sodium: 138 mmol/L (ref 135–146)
Total Bilirubin: 0.5 mg/dL (ref 0.2–1.2)
Total Protein: 7.5 g/dL (ref 6.1–8.1)

## 2023-02-07 LAB — POCT RAPID STREP A (OFFICE): Rapid Strep A Screen: NEGATIVE

## 2023-02-07 LAB — LIPASE: Lipase: 33 U/L (ref 7–60)

## 2023-02-07 LAB — POC COVID19 BINAXNOW: SARS Coronavirus 2 Ag: NEGATIVE

## 2023-02-07 MED ORDER — PANTOPRAZOLE SODIUM 40 MG PO TBEC: 40.0000 mg | DELAYED_RELEASE_TABLET | Freq: Every day | ORAL | 1 refills | Status: DC

## 2023-02-07 MED ORDER — ONDANSETRON HCL 4 MG PO TABS: 4.0000 mg | ORAL_TABLET | Freq: Three times a day (TID) | ORAL | 0 refills | Status: DC | PRN

## 2023-02-07 NOTE — Progress Notes (Signed)
Subjective:    Patient ID: Tiffany Berg, female    DOB: [birth year], 67 y.o.   MRN: [id number]  HPI  Pt in with 2 days feeling sick. She states started with upset stomach and diarrhea. She feels nausea. No vomiting. Has been using bathroom a lot last 2 days. She states very loose watery stools and at least 10 times a day. No antibiotics.  Pt thought skin coloring in face was off/greenish.   No on close to her sick. She lives alone.  Feels light headed.   Ate some goat meat night before symptoms started.      Review of Systems  Constitutional:  Positive for chills and fever. Negative for diaphoresis.       Subjective fever.  HENT:  Positive for sore throat. Negative for congestion, ear pain, postnasal drip, sinus pressure and sinus pain.   Respiratory:  Positive for cough. Negative for chest tightness and wheezing.        She states severe cough.  Cardiovascular:  Negative for chest pain and palpitations.  Gastrointestinal:  Positive for abdominal pain, diarrhea and nausea. Negative for anal bleeding, blood in stool and vomiting.  Musculoskeletal:  Positive for myalgias. Negative for back pain and joint swelling.  Skin:  Negative for rash.  Neurological:  Negative for tremors, syncope and numbness.       Light headed.  Psychiatric/Behavioral:  Negative for behavioral problems.     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   w/ symptomatic bradycardia, rate 30s;  s/p  PPM 03-14-2020   Myalgia, multiple sites    Pre-diabetes    Symptomatic bradycardia 03/12/2020   s/p PPM   Wears glasses    Wears partial dentures    upper and lower     Social History   Socioeconomic History   Marital status: Legally Separated    Spouse name: Not on file   Number of children: Not on file   Years of education: Not on file   Highest education level: Not on file  Occupational History   Not on file  Tobacco Use   Smoking status: Never   Smokeless tobacco: Never  Vaping Use   Vaping Use: Never used  Substance and Sexual Activity   Alcohol use: Yes    Comment: socially   Drug use: Never   Sexual activity: Yes    Birth control/protection: Post-menopausal, Surgical  Other Topics Concern   Not on file  Social History Narrative   Not on file   Social Determinants of Health   Financial Resource Strain: Low Risk  (02/22/2022)   Overall Financial Resource Strain (CARDIA)    Difficulty of Paying Living Expenses: Not hard at all  Food Insecurity: No Food Insecurity (02/22/2022)   Hunger Vital Sign  Worried About Programme researcher, broadcasting/film/video in the Last Year: Never true    Ran Out of Food in the Last Year: Never true  Transportation Needs: No Transportation Needs (02/22/2022)   PRAPARE - Administrator, Civil Service (Medical): No    Lack of Transportation (Non-Medical): No  Physical Activity: Insufficiently Active (02/22/2022)   Exercise Vital Sign    Days of Exercise per Week: 2 days    Minutes of Exercise per Session: 60 min  Stress: No Stress Concern Present (02/22/2022)   [employer] of Occupational Health - Occupational Stress Questionnaire    Feeling of Stress : Not at all  Social Connections: Unknown (02/22/2022)   Social Connection and Isolation Panel [NHANES]    Frequency of Communication with  Friends and Family: More than three times a week    Frequency of Social Gatherings with Friends and Family: More than three times a week    Attends Religious Services: Not on file    Active Member of Clubs or Organizations: Yes    Attends Banker Meetings: More than 4 times per year    Marital Status: Separated  Intimate Partner Violence: Not At Risk (02/22/2022)   Humiliation, Afraid, Rape, and Kick questionnaire    Fear of Current or Ex-Partner: No    Emotionally Abused: No    Physically Abused: No    Sexually Abused: No    Past Surgical History:  Procedure Laterality Date   Quintella Reichert OSTEOTOMY Right 10/15/2020   Procedure: Quintella Reichert OSTEOTOMY;  Surgeon: Edwin Cap, DPM;  Location: [facility];  Service: Podiatry;  Laterality: Right;  REGIONAL BLOCK   AIKEN OSTEOTOMY Left 02/24/2022   Procedure: Quintella Reichert OSTEOTOMY;  Surgeon: Edwin Cap, DPM;  Location: [facility];  Service: Podiatry;  Laterality: Left;   ANTERIOR AND POSTERIOR REPAIR N/A 02/18/2015   Procedure: ANTERIOR (CYSTOCELE) AND POSTERIOR REPAIR (RECTOCELE);  Surgeon: Osborn Coho, MD;  Location: [facility];  Service: Gynecology;  Laterality: N/A;   BLADDER SUSPENSION N/A 02/18/2015   Procedure: TRANSVAGINAL TAPE (TVT) PROCEDURE;  Surgeon: Osborn Coho, MD;  Location: [facility];  Service: Gynecology;  Laterality: N/A;   CYSTOSCOPY Bilateral 02/18/2015   Procedure: CYSTOSCOPY;  Surgeon: Osborn Coho, MD;  Location: [facility];  Service: Gynecology;  Laterality: Bilateral;   GRAFT APPLICATION Left 02/24/2022   Procedure: BONE GRAFT;  Surgeon: Edwin Cap, DPM;  Location: [facility];  Service: Podiatry;  Laterality: Left;   HALLUX VALGUS LAPIDUS Right 10/15/2020   Procedure: HALLUX VALGUS LAPIDUS AND BONE GRAFT;  Surgeon: Edwin Cap, DPM;  Location: [facility];  Service: Podiatry;  Laterality: Right;  WITH IV SEDATION; REGIONAL BLOCK   HALLUX VALGUS LAPIDUS  Left 02/24/2022   Procedure: HALLUX VALGUS LAPIDUS;  Surgeon: Edwin Cap, DPM;  Location: [facility];  Service: Podiatry;  Laterality: Left;   HAMMER TOE SURGERY Right 10/15/2020   Procedure: HAMMER TOE CORRECTION;  Surgeon: Edwin Cap, DPM;  Location: [facility];  Service: Podiatry;  Laterality: Right;  REGIONAL BLOCK   HAMMER TOE SURGERY Left 02/24/2022   Procedure: HAMMER TOE CORRECTION SECOND TOE LEFT FOOT;  Surgeon: Edwin Cap, DPM;  Location: [facility];  Service: Podiatry;  Laterality: Left;   LAPAROSCOPIC ASSISTED VAGINAL HYSTERECTOMY N/A 02/18/2015   Procedure: LAPAROSCOPIC ASSISTED VAGINAL HYSTERECTOMY;  Surgeon: Osborn Coho, MD;  Location: [facility];  Service: Gynecology;  Laterality: N/A;   MASTECTOMY, PARTIAL  Left 08-12-2013  @[facility]    excision another site   PACEMAKER IMPLANT N/A 03/14/2020   Procedure: PACEMAKER IMPLANT;  Surgeon: Regan Lemming, MD;  Location: [facility];  Service: Cardiovascular;  Laterality: N/A;   RADIOACTIVE SEED GUIDED PARTIAL MASTECTOMY/AXILLARY SENTINEL NODE BIOPSY/AXILLARY NODE DISSECTION Left 07-22-2013  @[facility]    SALPINGOOPHORECTOMY Bilateral 02/18/2015   Procedure: BILATERAL SALPINGO OOPHORECTOMY;  Surgeon: Osborn Coho, MD;  Location: [facility];  Service: Gynecology;  Laterality: Bilateral;    Family History  Problem Relation Age of Onset   Heart disease Mother    Cancer Father        prostata   Cancer Sister 39       Breast    No Known Allergies  Current Outpatient Medications on File Prior to Visit  Medication Sig Dispense Refill   B Complex Vitamins (VITAMIN B COMPLEX PO) Take by mouth.     fluticasone (FLONASE) 50 MCG/ACT nasal spray Place 1 spray into both nostrils 2 (two) times daily. 16 g 0   gabapentin (NEURONTIN) 300 MG capsule Take 1 capsule (300 mg total) by mouth 3 (three) times daily for 7 days. 21 capsule 0   levothyroxine (SYNTHROID) 75 MCG tablet Take 1  tablet (75 mcg total) by mouth daily before breakfast. 90 tablet 2   pantoprazole (PROTONIX) 40 MG tablet Take 1 tablet (40 mg total) by mouth daily. 30 tablet 1   tiZANidine (ZANAFLEX) 2 MG tablet Take 1-2 tablets (2-4 mg total) by mouth every 6 (six) hours as needed for muscle spasms. 60 tablet 1   No current facility-administered medications on file prior to visit.    BP (!) 114/52   Pulse 69   Temp 98.2 F (36.8 C)   Resp 18   Ht 5\' 3"  (1.6 m)   Wt 149 lb (67.6 kg)   SpO2 92%   BMI 26.39 kg/m        Objective:   Physical Exam  General Mental Status- Alert. General Appearance- Not in acute distress but looks uncomfortable.   Skin General: Color- Normal Color. Moisture- Normal Moisture.  Neck Carotid Arteries- Normal color. Moisture- Normal Moisture. No carotid bruits. No JVD.  Chest and Lung Exam Auscultation: Breath Sounds:-Normal.  Cardiovascular Auscultation:Rythm- Regular. Murmurs & Other Heart Sounds:Auscultation of the heart reveals- No Murmurs.  Abdomen Inspection:-Inspeection Normal. Palpation/Percussion:Note:No mass. Palpation and Percussion of the abdomen reveal- faint epigastic tender, Non Distended + BS, no rebound or guarding.   Neurologic Cranial Nerve exam:- CN III-XII intact(No nystagmus), symmetric smile. Strength:- 5/5 equal and symmetric strength both upper and lower extremities.    Heent- faint pinkish red pharynx. No tonsil hypertrophy.     Assessment & Plan:   Patient Instructions  1. Body aches Covid test negative. - POC COVID-19  2. Fever, unspecified fever cause Subjective fever  3. Diarrhea, unspecified type Probable viral gastroenteritis. But will follow labs to evaluate bacterial infectious cause vs o/p or c dif. Hydrate well with propel fitness or sugar free gatorade. Bland foods.  - Stool culture; Future - Ova and parasite examination; Future - Clostridium Difficile by PCR; Future - CBC w/Diff - Comp Met  (CMET)  Start immodium after you turn in stool studies  4. Pharyngitis, unspecified etiology - POCT rapid strep A Rapid study negative. Send out pending. Don't recommend antibiotic presenlty as exam not supicious and antibiotics can loosen stools.  5. Cough, unspecified type Since described yellow productive -dg chest today.  6. Dyspepsia  - pantoprazole (PROTONIX) 40  MG tablet; Take 1 tablet (40 mg total) by mouth daily.  Dispense: 30 tablet; Refill: 1  7. Nausea - Lipase Zofran for nausea.   8. Epigastric abdominal pain Associated with viral gastroenteris vs flare of prior dyspespsia.   Follow up 5 day or sooner if needed.        Esperanza Richters, PA-C

## 2023-02-07 NOTE — Patient Instructions (Addendum)
1. Body aches Covid test negative. - POC COVID-19  2. Fever, unspecified fever cause Subjective fever  3. Diarrhea, unspecified type Probable viral gastroenteritis. But will follow labs to evaluate bacterial infectious cause vs o/p or c dif. Hydrate well with propel fitness or sugar free gatorade. Bland foods.  - Stool culture; Future - Ova and parasite examination; Future - Clostridium Difficile by PCR; Future - CBC w/Diff - Comp Met (CMET)  Start immodium after you turn in stool studies  4. Pharyngitis, unspecified etiology - POCT rapid strep A Rapid study negative. Send out pending. Don't recommend antibiotic presenlty as exam not supicious and antibiotics can loosen stools.  5. Cough, unspecified type Since described yellow productive -dg chest today.  6. Dyspepsia  - pantoprazole (PROTONIX) 40 MG tablet; Take 1 tablet (40 mg total) by mouth daily.  Dispense: 30 tablet; Refill: 1  7. Nausea - Lipase Zofran for nausea.   8. Epigastric abdominal pain Associated with viral gastroenteris vs flare of prior dyspespsia.  -refilled protonix  Follow up 5 day or sooner if needed.(But also give me update on Friday morning as to how you are doing)

## 2023-02-07 NOTE — Addendum Note (Signed)
Addended by: Gwenevere Abbot on: 02/07/2023 08:33 PM   Modules accepted: Orders

## 2023-02-08 ENCOUNTER — Other Ambulatory Visit: Payer: 59

## 2023-02-08 DIAGNOSIS — R197 Diarrhea, unspecified: Secondary | ICD-10-CM | POA: Diagnosis not present

## 2023-02-08 NOTE — Progress Notes (Signed)
Sample drop off

## 2023-02-09 ENCOUNTER — Ambulatory Visit (INDEPENDENT_AMBULATORY_CARE_PROVIDER_SITE_OTHER): Payer: 59 | Admitting: Medical

## 2023-02-09 VITALS — BP 124/70 | HR 64 | Temp 98.0°F | Resp 18 | Ht 63.0 in | Wt 149.0 lb

## 2023-02-09 DIAGNOSIS — D72829 Elevated white blood cell count, unspecified: Secondary | ICD-10-CM

## 2023-02-09 DIAGNOSIS — R110 Nausea: Secondary | ICD-10-CM

## 2023-02-09 DIAGNOSIS — R197 Diarrhea, unspecified: Secondary | ICD-10-CM | POA: Diagnosis not present

## 2023-02-09 LAB — COMPREHENSIVE METABOLIC PANEL
ALT: 21 U/L (ref 0–35)
AST: 28 U/L (ref 0–37)
Albumin: 4.1 g/dL (ref 3.5–5.2)
Alkaline Phosphatase: 84 U/L (ref 39–117)
BUN: 24 mg/dL — ABNORMAL HIGH (ref 6–23)
CO2: 30 mEq/L (ref 19–32)
Calcium: 9.2 mg/dL (ref 8.4–10.5)
Chloride: 101 mEq/L (ref 96–112)
Creatinine, Ser: 0.89 mg/dL (ref 0.40–1.20)
GFR: 66.23 mL/min (ref >60.00)
Glucose, Bld: 101 mg/dL — ABNORMAL HIGH (ref 70–99)
Potassium: 4.1 mEq/L (ref 3.5–5.1)
Sodium: 141 mEq/L (ref 135–145)
Total Bilirubin: 0.4 mg/dL (ref 0.2–1.2)
Total Protein: 7 g/dL (ref 6.0–8.3)

## 2023-02-09 LAB — CBC WITH DIFFERENTIAL/PLATELET
Basophils Absolute: 0 10*3/uL (ref 0.0–0.1)
Basophils Relative: 0.3 % (ref 0.0–3.0)
Eosinophils Absolute: 0.2 10*3/uL (ref 0.0–0.7)
Eosinophils Relative: 3.1 % (ref 0.0–5.0)
HCT: 43.2 % (ref 36.0–46.0)
Hemoglobin: 14.4 g/dL (ref 12.0–15.0)
Lymphocytes Relative: 27.7 % (ref 12.0–46.0)
Lymphs Abs: 1.7 10*3/uL (ref 0.7–4.0)
MCHC: 33.4 g/dL (ref 30.0–36.0)
MCV: 91.3 fl (ref 78.0–100.0)
Monocytes Absolute: 0.6 10*3/uL (ref 0.1–1.0)
Monocytes Relative: 10.3 % (ref 3.0–12.0)
Neutro Abs: 3.6 10*3/uL (ref 1.4–7.7)
Neutrophils Relative %: 58.6 % (ref 43.0–77.0)
Platelets: 246 10*3/uL (ref 150.0–400.0)
RBC: 4.73 Mil/uL (ref 3.87–5.11)
RDW: 14 % (ref 11.5–15.5)
WBC: 6.2 10*3/uL (ref 4.0–10.5)

## 2023-02-09 LAB — OVA AND PARASITE EXAMINATION
CONCENTRATE RESULT:: NONE SEEN
MICRO NUMBER:: 15049784
SPECIMEN QUALITY:: ADEQUATE
TRICHROME RESULT:: NONE SEEN

## 2023-02-09 LAB — CULTURE, GROUP A STREP
MICRO NUMBER:: 15044465
SPECIMEN QUALITY:: ADEQUATE

## 2023-02-09 LAB — CLOSTRIDIUM DIFFICILE BY PCR: Toxigenic C. Difficile by PCR: NEGATIVE

## 2023-02-09 NOTE — Progress Notes (Signed)
Subjective:    Patient ID: Tiffany Berg, female    DOB: 10-23-1955, 67 y.o.   MRN: [id number]  HPI  Pt in for follow up.  Pt states feels some better. Less stomach upset but still present with nausea and diarrhea. Has 3 loose stools a day. Pt just started protonix today and first dose zofran.  Pt has no started immodium yet.   See last hpi and AVS. Had asked for appointment today since on cbc wbc came back 14. Wanted to evaluted before weekend in order to see if some better and lower white count.  Based on clinical presentation today and labs thought may advise ED evaluation.  Pt has turned in stool studies yesterday.   Pt does not rember hx of diverticulitis. She reports remote hx of one colonoscopy. Does not remember the results.    Review of Systems  Constitutional:  Negative for chills, fatigue and fever.  Respiratory:  Negative for cough, choking, shortness of breath and wheezing.   Gastrointestinal:  Positive for abdominal pain, diarrhea and nausea. Negative for blood in stool and vomiting.  Genitourinary:  Negative for dyspareunia.  Skin:  Negative for rash.  Neurological:  Negative for dizziness, seizures, syncope and headaches.  Hematological:  Negative for adenopathy. Does not bruise/bleed easily.  Psychiatric/Behavioral:  Negative for behavioral problems, decreased concentration and dysphoric mood.     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   w/ symptomatic bradycardia, rate 30s;  s/p  PPM 03-14-2020   Myalgia, multiple sites    Pre-diabetes    Symptomatic bradycardia 03/12/2020   s/p PPM   Wears glasses    Wears partial dentures    upper and lower     Social History   Socioeconomic History   Marital status: Legally Separated    Spouse name: Not on file   Number of children: Not on file   Years of education: Not on file   Highest education level: Not on file  Occupational History   Not on file  Tobacco Use   Smoking status: Never   Smokeless tobacco: Never  Vaping Use   Vaping Use: Never used  Substance and Sexual Activity   Alcohol use: Yes    Comment: socially   Drug use: Never   Sexual activity: Yes    Birth control/protection: Post-menopausal, Surgical  Other Topics Concern   Not on file  Social History Narrative   Not on file   Social Determinants of Health   Financial Resource Strain: Low Risk  (02/22/2022)   Overall Financial Resource Strain (CARDIA)    Difficulty of Paying Living Expenses: Not hard at all  Food Insecurity: No Food Insecurity (02/22/2022)   Hunger Vital Sign    Worried About Running Out of Food in the Last  Year: Never true    Ran Out of Food in the Last Year: Never true  Transportation Needs: No Transportation Needs (02/22/2022)   PRAPARE - Administrator, Civil Service (Medical): No    Lack of Transportation (Non-Medical): No  Physical Activity: Insufficiently Active (02/22/2022)   Exercise Vital Sign    Days of Exercise per Week: 2 days    Minutes of Exercise per Session: 60 min  Stress: No Stress Concern Present (02/22/2022)   [employer] of Occupational Health - Occupational Stress Questionnaire    Feeling of Stress : Not at all  Social Connections: Unknown (02/22/2022)   Social Connection and Isolation Panel [NHANES]    Frequency of  Communication with Friends and Family: More than three times a week    Frequency of Social Gatherings with Friends and Family: More than three times a week    Attends Religious Services: Not on file    Active Member of Clubs or Organizations: Yes    Attends Banker Meetings: More than 4 times per year    Marital Status: Separated  Intimate Partner Violence: Not At Risk (02/22/2022)   Humiliation, Afraid, Rape, and Kick questionnaire    Fear of Current or Ex-Partner: No    Emotionally Abused: No    Physically Abused: No    Sexually Abused: No    Past Surgical History:  Procedure Laterality Date   Quintella Reichert OSTEOTOMY Right 10/15/2020   Procedure: Quintella Reichert OSTEOTOMY;  Surgeon: Edwin Cap, DPM;  Location: [facility];  Service: Podiatry;  Laterality: Right;  REGIONAL BLOCK   AIKEN OSTEOTOMY Left 02/24/2022   Procedure: Quintella Reichert OSTEOTOMY;  Surgeon: Edwin Cap, DPM;  Location: [facility];  Service: Podiatry;  Laterality: Left;   ANTERIOR AND POSTERIOR REPAIR N/A 02/18/2015   Procedure: ANTERIOR (CYSTOCELE) AND POSTERIOR REPAIR (RECTOCELE);  Surgeon: Osborn Coho, MD;  Location: [facility];  Service: Gynecology;  Laterality: N/A;   BLADDER SUSPENSION N/A 02/18/2015   Procedure: TRANSVAGINAL TAPE (TVT) PROCEDURE;  Surgeon: Osborn Coho, MD;  Location: [facility];  Service: Gynecology;  Laterality: N/A;   CYSTOSCOPY Bilateral 02/18/2015   Procedure: CYSTOSCOPY;  Surgeon: Osborn Coho, MD;  Location: [facility];  Service: Gynecology;  Laterality: Bilateral;   GRAFT APPLICATION Left 02/24/2022   Procedure: BONE GRAFT;  Surgeon: Edwin Cap, DPM;  Location: [facility];  Service: Podiatry;  Laterality: Left;   HALLUX VALGUS LAPIDUS Right 10/15/2020   Procedure: HALLUX VALGUS LAPIDUS AND BONE GRAFT;  Surgeon: Edwin Cap, DPM;  Location: [facility];  Service: Podiatry;  Laterality: Right;  WITH IV SEDATION; REGIONAL BLOCK    HALLUX VALGUS LAPIDUS Left 02/24/2022   Procedure: HALLUX VALGUS LAPIDUS;  Surgeon: Edwin Cap, DPM;  Location: [facility];  Service: Podiatry;  Laterality: Left;   HAMMER TOE SURGERY Right 10/15/2020   Procedure: HAMMER TOE CORRECTION;  Surgeon: Edwin Cap, DPM;  Location: [facility];  Service: Podiatry;  Laterality: Right;  REGIONAL BLOCK   HAMMER TOE SURGERY Left 02/24/2022   Procedure: HAMMER TOE CORRECTION SECOND TOE LEFT FOOT;  Surgeon: Edwin Cap, DPM;  Location: [facility] ;  Service: Podiatry;  Laterality: Left;   LAPAROSCOPIC ASSISTED VAGINAL HYSTERECTOMY N/A 02/18/2015   Procedure: LAPAROSCOPIC ASSISTED VAGINAL HYSTERECTOMY;  Surgeon: Osborn Coho, MD;  Location: [facility];  Service: Gynecology;  Laterality: N/A;   MASTECTOMY, PARTIAL Left 08-12-2013  @[facility]    excision another site  PACEMAKER IMPLANT N/A 03/14/2020   Procedure: PACEMAKER IMPLANT;  Surgeon: Regan Lemming, MD;  Location: [facility];  Service: Cardiovascular;  Laterality: N/A;   RADIOACTIVE SEED GUIDED PARTIAL MASTECTOMY/AXILLARY SENTINEL NODE BIOPSY/AXILLARY NODE DISSECTION Left 07-22-2013  @[facility]    SALPINGOOPHORECTOMY Bilateral 02/18/2015   Procedure: BILATERAL SALPINGO OOPHORECTOMY;  Surgeon: Osborn Coho, MD;  Location: [facility];  Service: Gynecology;  Laterality: Bilateral;    Family History  Problem Relation Age of Onset   Heart disease Mother    Cancer Father        prostata   Cancer Sister 1       Breast    No Known Allergies  Current Outpatient Medications on File Prior to Visit  Medication Sig Dispense Refill   B Complex Vitamins (VITAMIN B COMPLEX PO) Take by mouth.     fluticasone (FLONASE) 50 MCG/ACT nasal spray Place 1 spray into both nostrils 2 (two) times daily. 16 g 0   gabapentin (NEURONTIN) 300 MG capsule Take 1 capsule (300 mg total) by mouth 3 (three) times daily for 7 days. 21 capsule 0   levothyroxine (SYNTHROID)  75 MCG tablet Take 1 tablet (75 mcg total) by mouth daily before breakfast. 90 tablet 2   ondansetron (ZOFRAN) 4 MG tablet Take 1 tablet (4 mg total) by mouth every 8 (eight) hours as needed for nausea or vomiting. 20 tablet 0   pantoprazole (PROTONIX) 40 MG tablet Take 1 tablet (40 mg total) by mouth daily. 30 tablet 1   tiZANidine (ZANAFLEX) 2 MG tablet Take 1-2 tablets (2-4 mg total) by mouth every 6 (six) hours as needed for muscle spasms. 60 tablet 1   No current facility-administered medications on file prior to visit.    BP 124/70   Pulse 64   Temp 98 F (36.7 C)   Resp 18   Ht 5\' 3"  (1.6 m)   Wt 149 lb (67.6 kg)   SpO2 94%   BMI 26.39 kg/m        Objective:   Physical Exam  General Mental Status- Alert. General Appearance- Not in acute distress but looks uncomfortable.    Skin General: Color- Normal Color. Moisture- Normal Moisture.   Neck Carotid Arteries- Normal color. Moisture- Normal Moisture. No carotid bruits. No JVD.   Chest and Lung Exam Auscultation: Breath Sounds:-Normal.   Cardiovascular Auscultation:Rythm- Regular. Murmurs & Other Heart Sounds:Auscultation of the heart reveals- No Murmurs.   Abdomen Inspection:-Inspeection Normal. Palpation/Percussion:Note:No mass. Palpation and Percussion of the abdomen reveal- faint (mild)epigastic tender, Non Distended + BS, no rebound or guarding. Left low quadrant mild tenderness.     Neurologic Cranial Nerve exam:- CN III-XII intact(No nystagmus), symmetric smile. Strength:- 5/5 equal and symmetric strength both upper and lower extremities.       Assessment & Plan:   Patient Instructions  First time seeing you earlier this week.  Presentation seems more viral gastroenteritis like but considered differential  dx food poisoning/bacterial infection.  Glad to hear improvement in symptoms such as no longer having chills, subjective fever or diffuse myalgias.  Epigastric pain persist and now some left lower  quadrant discomfort on exam.  Your WBC elevation of 14.2 was concerning on review I want to see you back today to make sure clinically some improved and white count not increasing.  Present plan rest, hydrate well and eat bland foods.  You just started pantoprazole and Zofran today.  You can continue those and also start Imodium over-the-counter.  Stool culture studies  are pending.  After review of WBC may advise emergency department evaluation as weekend is upcoming and you might benefit from stat CT of abdomen pelvis if significant WBC elevation.  If numbers are improved considering possible short course of antibiotic to cover differential diagnosis of diverticulitis pending stool culture studies.  If I do not advise emergency department evaluation today but if you have severe worsening signs or symptoms still would recommend emergency department evaluation.  Will update you on lab results when they are in.  Follow-up date to be determined after lab review.   Esperanza Richters, PA-C

## 2023-02-09 NOTE — Patient Instructions (Signed)
First time seeing you earlier this week.  Presentation seems more viral gastroenteritis like but considered differential  dx food poisoning/bacterial infection.  Glad to hear improvement in symptoms such as no longer having chills, subjective fever or diffuse myalgias.  Epigastric pain persist and now some left lower quadrant discomfort on exam.  Your WBC elevation of 14.2 was concerning on review I want to see you back today to make sure clinically some improved and white count not increasing.  Present plan rest, hydrate well and eat bland foods.  You just started pantoprazole and Zofran today.  You can continue those and also start Imodium over-the-counter.  Stool culture studies are pending.  After review of WBC may advise emergency department evaluation as weekend is upcoming and you might benefit from stat CT of abdomen pelvis if significant WBC elevation.  If numbers are improved considering possible short course of antibiotic to cover differential diagnosis of diverticulitis pending stool culture studies.  If I do not advise emergency department evaluation today but if you have severe worsening signs or symptoms still would recommend emergency department evaluation.  Will update you on lab results when they are in.  Follow-up date to be determined after lab review.

## 2023-02-10 LAB — STOOL CULTURE: E coli, Shiga toxin Assay: NEGATIVE

## 2023-02-10 MED ORDER — AZITHROMYCIN 250 MG PO TABS: ORAL_TABLET | ORAL | 0 refills | Status: DC

## 2023-02-10 NOTE — Addendum Note (Signed)
Addended by: Gwenevere Abbot on: 02/10/2023 04:58 PM   Modules accepted: Orders

## 2023-02-14 DIAGNOSIS — H401223 Low-tension glaucoma, left eye, severe stage: Secondary | ICD-10-CM | POA: Diagnosis not present

## 2023-02-14 DIAGNOSIS — H40011 Open angle with borderline findings, low risk, right eye: Secondary | ICD-10-CM | POA: Diagnosis not present

## 2023-02-14 DIAGNOSIS — H35363 Drusen (degenerative) of macula, bilateral: Secondary | ICD-10-CM | POA: Diagnosis not present

## 2023-02-14 DIAGNOSIS — H25813 Combined forms of age-related cataract, bilateral: Secondary | ICD-10-CM | POA: Diagnosis not present

## 2023-02-14 DIAGNOSIS — H40033 Anatomical narrow angle, bilateral: Secondary | ICD-10-CM | POA: Diagnosis not present

## 2023-02-27 ENCOUNTER — Ambulatory Visit (INDEPENDENT_AMBULATORY_CARE_PROVIDER_SITE_OTHER): Payer: 59 | Admitting: Family Medicine

## 2023-02-27 ENCOUNTER — Encounter (HOSPITAL_COMMUNITY): Payer: Self-pay

## 2023-02-27 ENCOUNTER — Other Ambulatory Visit: Payer: Self-pay

## 2023-02-27 ENCOUNTER — Observation Stay (HOSPITAL_COMMUNITY)
Admission: EM | Admit: 2023-02-27 | Discharge: 2023-02-28 | Disposition: A | Discharge status: Home / Self Care | Payer: 59 | Attending: Family Medicine | Admitting: Family Medicine

## 2023-02-27 ENCOUNTER — Emergency Department (HOSPITAL_COMMUNITY): Payer: 59

## 2023-02-27 DIAGNOSIS — Z950 Presence of cardiac pacemaker: Secondary | ICD-10-CM | POA: Diagnosis not present

## 2023-02-27 DIAGNOSIS — K5730 Diverticulosis of large intestine without perforation or abscess without bleeding: Secondary | ICD-10-CM | POA: Diagnosis not present

## 2023-02-27 DIAGNOSIS — Z91199 Patient's noncompliance with other medical treatment and regimen due to unspecified reason: Secondary | ICD-10-CM

## 2023-02-27 DIAGNOSIS — R0989 Other specified symptoms and signs involving the circulatory and respiratory systems: Secondary | ICD-10-CM | POA: Diagnosis not present

## 2023-02-27 DIAGNOSIS — Z853 Personal history of malignant neoplasm of breast: Secondary | ICD-10-CM | POA: Diagnosis not present

## 2023-02-27 DIAGNOSIS — I5A Non-ischemic myocardial injury (non-traumatic): Secondary | ICD-10-CM | POA: Insufficient documentation

## 2023-02-27 DIAGNOSIS — R0789 Other chest pain: Secondary | ICD-10-CM | POA: Diagnosis not present

## 2023-02-27 DIAGNOSIS — R404 Transient alteration of awareness: Secondary | ICD-10-CM | POA: Diagnosis not present

## 2023-02-27 DIAGNOSIS — E039 Hypothyroidism, unspecified: Secondary | ICD-10-CM | POA: Diagnosis not present

## 2023-02-27 DIAGNOSIS — J984 Other disorders of lung: Secondary | ICD-10-CM | POA: Diagnosis not present

## 2023-02-27 DIAGNOSIS — M47812 Spondylosis without myelopathy or radiculopathy, cervical region: Secondary | ICD-10-CM | POA: Diagnosis not present

## 2023-02-27 DIAGNOSIS — Z041 Encounter for examination and observation following transport accident: Secondary | ICD-10-CM | POA: Diagnosis not present

## 2023-02-27 DIAGNOSIS — K8020 Calculus of gallbladder without cholecystitis without obstruction: Secondary | ICD-10-CM | POA: Diagnosis not present

## 2023-02-27 DIAGNOSIS — R079 Chest pain, unspecified: Secondary | ICD-10-CM

## 2023-02-27 DIAGNOSIS — S6991XA Unspecified injury of right wrist, hand and finger(s), initial encounter: Secondary | ICD-10-CM | POA: Diagnosis not present

## 2023-02-27 DIAGNOSIS — R7989 Other specified abnormal findings of blood chemistry: Secondary | ICD-10-CM

## 2023-02-27 DIAGNOSIS — M79641 Pain in right hand: Secondary | ICD-10-CM | POA: Diagnosis not present

## 2023-02-27 DIAGNOSIS — M19041 Primary osteoarthritis, right hand: Secondary | ICD-10-CM | POA: Diagnosis not present

## 2023-02-27 DIAGNOSIS — R778 Other specified abnormalities of plasma proteins: Secondary | ICD-10-CM | POA: Diagnosis not present

## 2023-02-27 DIAGNOSIS — R918 Other nonspecific abnormal finding of lung field: Secondary | ICD-10-CM | POA: Diagnosis not present

## 2023-02-27 DIAGNOSIS — Z79899 Other long term (current) drug therapy: Secondary | ICD-10-CM | POA: Diagnosis not present

## 2023-02-27 DIAGNOSIS — K760 Fatty (change of) liver, not elsewhere classified: Secondary | ICD-10-CM | POA: Diagnosis not present

## 2023-02-27 DIAGNOSIS — Z743 Need for continuous supervision: Secondary | ICD-10-CM | POA: Diagnosis not present

## 2023-02-27 DIAGNOSIS — I6782 Cerebral ischemia: Secondary | ICD-10-CM | POA: Diagnosis not present

## 2023-02-27 DIAGNOSIS — G9389 Other specified disorders of brain: Secondary | ICD-10-CM | POA: Diagnosis not present

## 2023-02-27 DIAGNOSIS — Z23 Encounter for immunization: Secondary | ICD-10-CM | POA: Diagnosis not present

## 2023-02-27 DIAGNOSIS — M79643 Pain in unspecified hand: Secondary | ICD-10-CM | POA: Diagnosis not present

## 2023-02-27 DIAGNOSIS — I700 Atherosclerosis of aorta: Secondary | ICD-10-CM | POA: Diagnosis not present

## 2023-02-27 DIAGNOSIS — S60041A Contusion of right ring finger without damage to nail, initial encounter: Secondary | ICD-10-CM

## 2023-02-27 LAB — I-STAT CHEM 8, ED
BUN: 21 mg/dL (ref 8–23)
Calcium, Ion: 1.22 mmol/L (ref 1.15–1.40)
Chloride: 106 mmol/L (ref 98–111)
Creatinine, Ser: 0.7 mg/dL (ref 0.44–1.00)
Glucose, Bld: 159 mg/dL — ABNORMAL HIGH (ref 70–99)
HCT: 45 % (ref 36.0–46.0)
Hemoglobin: 15.3 g/dL — ABNORMAL HIGH (ref 12.0–15.0)
Potassium: 3.2 mmol/L — ABNORMAL LOW (ref 3.5–5.1)
Sodium: 144 mmol/L (ref 135–145)
TCO2: 27 mmol/L (ref 22–32)

## 2023-02-27 MED ORDER — TETANUS-DIPHTH-ACELL PERTUSSIS 5-2.5-18.5 LF-MCG/0.5 IM SUSY
0.5000 mL | PREFILLED_SYRINGE | Freq: Once | INTRAMUSCULAR | Status: AC
  Administered 2023-02-28: 0.5 mL via INTRAMUSCULAR
  Filled 2023-02-27: qty 0.5

## 2023-02-27 NOTE — Progress Notes (Signed)
Nurse let me know she was not able to reach the patient to get her checked in for her virtual AWV. Used interpreter services to attempt to reach patient multiple time at multiple numbers. LM instructing her to call the office to reschedule since we were unable to reach her.
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
## 2023-02-28 ENCOUNTER — Observation Stay (HOSPITAL_BASED_OUTPATIENT_CLINIC_OR_DEPARTMENT_OTHER): Payer: 59

## 2023-02-28 ENCOUNTER — Emergency Department (HOSPITAL_COMMUNITY): Payer: 59

## 2023-02-28 DIAGNOSIS — I6782 Cerebral ischemia: Secondary | ICD-10-CM | POA: Diagnosis not present

## 2023-02-28 DIAGNOSIS — Z041 Encounter for examination and observation following transport accident: Secondary | ICD-10-CM | POA: Diagnosis not present

## 2023-02-28 DIAGNOSIS — G8911 Acute pain due to trauma: Secondary | ICD-10-CM | POA: Diagnosis not present

## 2023-02-28 DIAGNOSIS — K760 Fatty (change of) liver, not elsewhere classified: Secondary | ICD-10-CM | POA: Diagnosis not present

## 2023-02-28 DIAGNOSIS — R918 Other nonspecific abnormal finding of lung field: Secondary | ICD-10-CM | POA: Diagnosis not present

## 2023-02-28 DIAGNOSIS — G9389 Other specified disorders of brain: Secondary | ICD-10-CM | POA: Diagnosis not present

## 2023-02-28 DIAGNOSIS — S6991XA Unspecified injury of right wrist, hand and finger(s), initial encounter: Secondary | ICD-10-CM | POA: Diagnosis not present

## 2023-02-28 DIAGNOSIS — I361 Nonrheumatic tricuspid (valve) insufficiency: Secondary | ICD-10-CM | POA: Diagnosis not present

## 2023-02-28 DIAGNOSIS — K8020 Calculus of gallbladder without cholecystitis without obstruction: Secondary | ICD-10-CM | POA: Diagnosis not present

## 2023-02-28 DIAGNOSIS — R7989 Other specified abnormal findings of blood chemistry: Secondary | ICD-10-CM

## 2023-02-28 DIAGNOSIS — J984 Other disorders of lung: Secondary | ICD-10-CM | POA: Diagnosis not present

## 2023-02-28 DIAGNOSIS — M47812 Spondylosis without myelopathy or radiculopathy, cervical region: Secondary | ICD-10-CM | POA: Diagnosis not present

## 2023-02-28 DIAGNOSIS — I700 Atherosclerosis of aorta: Secondary | ICD-10-CM | POA: Diagnosis not present

## 2023-02-28 LAB — CBC WITH DIFFERENTIAL/PLATELET
Abs Immature Granulocytes: 0.03 10*3/uL (ref 0.00–0.07)
Basophils Absolute: 0.1 10*3/uL (ref 0.0–0.1)
Basophils Relative: 1 %
Eosinophils Absolute: 0.1 10*3/uL (ref 0.0–0.5)
Eosinophils Relative: 2 %
HCT: 43.2 % (ref 36.0–46.0)
Hemoglobin: 14.8 g/dL (ref 12.0–15.0)
Immature Granulocytes: 0 %
Lymphocytes Relative: 24 %
Lymphs Abs: 1.9 10*3/uL (ref 0.7–4.0)
MCH: 30.1 pg (ref 26.0–34.0)
MCHC: 34.3 g/dL (ref 30.0–36.0)
MCV: 87.8 fL (ref 80.0–100.0)
Monocytes Absolute: 0.6 10*3/uL (ref 0.1–1.0)
Monocytes Relative: 8 %
Neutro Abs: 5.3 10*3/uL (ref 1.7–7.7)
Neutrophils Relative %: 65 %
Platelets: 332 10*3/uL (ref 150–400)
RBC: 4.92 MIL/uL (ref 3.87–5.11)
RDW: 13.7 % (ref 11.5–15.5)
WBC: 8 10*3/uL (ref 4.0–10.5)
nRBC: 0 % (ref 0.0–0.2)

## 2023-02-28 LAB — ECHOCARDIOGRAM COMPLETE
Area-P 1/2: 2.99 cm2
Height: 63 in
S' Lateral: 2.8 cm
Weight: 2320 oz

## 2023-02-28 LAB — TROPONIN I (HIGH SENSITIVITY)
Troponin I (High Sensitivity): 13 ng/L (ref <18)
Troponin I (High Sensitivity): 34 ng/L — ABNORMAL HIGH (ref <18)
Troponin I (High Sensitivity): 59 ng/L — ABNORMAL HIGH (ref <18)

## 2023-02-28 MED ORDER — METHOCARBAMOL 500 MG PO TABS
750.0000 mg | ORAL_TABLET | Freq: Three times a day (TID) | ORAL | Status: DC | PRN
  Administered 2023-02-28: 750 mg via ORAL
  Filled 2023-02-28: qty 2

## 2023-02-28 MED ORDER — POTASSIUM CHLORIDE CRYS ER 20 MEQ PO TBCR
40.0000 meq | EXTENDED_RELEASE_TABLET | Freq: Once | ORAL | Status: AC
  Administered 2023-02-28: 40 meq via ORAL
  Filled 2023-02-28: qty 2

## 2023-02-28 MED ORDER — LIDOCAINE 5 % EX PTCH
2.0000 | MEDICATED_PATCH | CUTANEOUS | Status: DC
  Administered 2023-02-28: 2 via TRANSDERMAL
  Filled 2023-02-28: qty 2

## 2023-02-28 MED ORDER — KETOROLAC TROMETHAMINE 15 MG/ML IJ SOLN
15.0000 mg | Freq: Four times a day (QID) | INTRAMUSCULAR | Status: DC | PRN
  Administered 2023-02-28: 15 mg via INTRAVENOUS
  Filled 2023-02-28: qty 1

## 2023-02-28 MED ORDER — ONDANSETRON HCL 4 MG PO TABS: 4.0000 mg | ORAL_TABLET | Freq: Four times a day (QID) | ORAL | Status: DC | PRN

## 2023-02-28 MED ORDER — PERFLUTREN LIPID MICROSPHERE
1.0000 mL | INTRAVENOUS | Status: AC | PRN
  Administered 2023-02-28: 2 mL via INTRAVENOUS

## 2023-02-28 MED ORDER — PANTOPRAZOLE SODIUM 40 MG PO TBEC
40.0000 mg | DELAYED_RELEASE_TABLET | Freq: Every day | ORAL | Status: DC
  Administered 2023-02-28: 40 mg via ORAL
  Filled 2023-02-28: qty 1

## 2023-02-28 MED ORDER — POLYETHYLENE GLYCOL 3350 17 G PO PACK: 17.0000 g | PACK | Freq: Every day | ORAL | Status: DC | PRN

## 2023-02-28 MED ORDER — SODIUM CHLORIDE 0.9 % IV SOLN: INTRAVENOUS | Status: DC

## 2023-02-28 MED ORDER — POTASSIUM CHLORIDE CRYS ER 20 MEQ PO TBCR: 40.0000 meq | EXTENDED_RELEASE_TABLET | Freq: Once | ORAL | Status: DC

## 2023-02-28 MED ORDER — ENOXAPARIN SODIUM 40 MG/0.4ML IJ SOSY: 40.0000 mg | PREFILLED_SYRINGE | Freq: Every day | INTRAMUSCULAR | Status: DC

## 2023-02-28 MED ORDER — IOHEXOL 350 MG/ML SOLN
75.0000 mL | Freq: Once | INTRAVENOUS | Status: AC | PRN
  Administered 2023-02-28: 75 mL via INTRAVENOUS

## 2023-02-28 MED ORDER — LEVOTHYROXINE SODIUM 75 MCG PO TABS
75.0000 ug | ORAL_TABLET | Freq: Every day | ORAL | Status: DC
  Administered 2023-02-28: 75 ug via ORAL
  Filled 2023-02-28: qty 1

## 2023-02-28 MED ORDER — ONDANSETRON HCL 4 MG/2ML IJ SOLN: 4.0000 mg | Freq: Four times a day (QID) | INTRAMUSCULAR | Status: DC | PRN

## 2023-02-28 MED ORDER — KETOROLAC TROMETHAMINE 30 MG/ML IJ SOLN
15.0000 mg | Freq: Once | INTRAMUSCULAR | Status: AC
  Administered 2023-02-28: 15 mg via INTRAVENOUS
  Filled 2023-02-28: qty 1

## 2023-02-28 MED ORDER — ACETAMINOPHEN 325 MG PO TABS: 650.0000 mg | ORAL_TABLET | Freq: Four times a day (QID) | ORAL | Status: DC | PRN

## 2023-02-28 MED ORDER — ACETAMINOPHEN 650 MG RE SUPP: 650.0000 mg | Freq: Four times a day (QID) | RECTAL | Status: DC | PRN

## 2023-02-28 NOTE — Progress Notes (Signed)
2D Echocardiogram performed.  

## 2023-02-28 NOTE — ED Notes (Signed)
Left message with ECHO to expedite Per admitting team
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
## 2023-02-28 NOTE — ED Notes (Signed)
Patient walked to the restroom and did well. Danford MD notified

## 2023-02-28 NOTE — ED Notes (Signed)
[employer]tr[employer] called stating pt's pacemaker last interpreted on 01/18/2023.  No arrhythmias since last interpretation. Pacemaker is functioning as programmed to do. EDP made aware.
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
## 2023-03-28 DIAGNOSIS — H401223 Low-tension glaucoma, left eye, severe stage: Secondary | ICD-10-CM | POA: Diagnosis not present

## 2023-03-30 ENCOUNTER — Ambulatory Visit: Payer: 59 | Admitting: Medical

## 2023-03-30 VITALS — BP 100/70 | HR 68 | Resp 18 | Ht 63.0 in | Wt 149.4 lb

## 2023-03-30 DIAGNOSIS — M791 Myalgia, unspecified site: Secondary | ICD-10-CM | POA: Diagnosis not present

## 2023-03-30 DIAGNOSIS — N898 Other specified noninflammatory disorders of vagina: Secondary | ICD-10-CM

## 2023-03-30 DIAGNOSIS — J029 Acute pharyngitis, unspecified: Secondary | ICD-10-CM

## 2023-03-30 DIAGNOSIS — R102 Pelvic and perineal pain: Secondary | ICD-10-CM

## 2023-03-30 LAB — POCT RAPID STREP A (OFFICE): Rapid Strep A Screen: NEGATIVE

## 2023-03-30 LAB — POC URINALSYSI DIPSTICK (AUTOMATED)
Bilirubin, UA: NEGATIVE
Blood, UA: NEGATIVE
Glucose, UA: NEGATIVE
Ketones, UA: NEGATIVE
Nitrite, UA: NEGATIVE
Protein, UA: NEGATIVE
Spec Grav, UA: <=1.005 — AB (ref 1.010–1.025)
Urobilinogen, UA: 0.2 E.U./dL
pH, UA: 6 (ref 5.0–8.0)

## 2023-03-30 MED ORDER — FLUCONAZOLE 150 MG PO TABS: 150.0000 mg | ORAL_TABLET | Freq: Every day | ORAL | 0 refills | Status: DC

## 2023-03-30 NOTE — Patient Instructions (Addendum)
1. Vaginal itching/likely yeast infection Based on description will rx diflucan one tab po for one day. If signs/symptoms persist then will need vaginal swab testing.  2. Sore throat Rapid strep negative. Symptoms improving since one week out and test negative think viral pharyngitis. Exam reassuring/negative. I don't think antibiotic indicated and if were to give could make yeast infection worse.  3. Myalgia Resolving as well. Think viral syndrome related.  4. Suprapubic pressure but no other uti type symptoms - POCT Urinalysis Dipstick (Automated) - Urine Culture   Follow up in 10- 14 days or sooner if needed.

## 2023-03-30 NOTE — Progress Notes (Signed)
Subjective:    Patient ID: Tiffany Berg, female    DOB: Sep 29, 1955, 67 y.o.   MRN: [id number]  HPI Pt states she has some mild itching in vaginal area. She states has had for 3 months. Pt thinks she tried monistat but did not help.   No pain on urination. No frequent urination. Pt states felt subjective fever 2 night ago but not presently.   Since Friday she has sore throat mild. Mild sore throat and mild body aches. Throat now feels better. Body aches resolved now.   Review of Systems  Constitutional:  Negative for chills, fatigue and fever.  HENT:  Negative for congestion.   Respiratory:  Negative for cough, chest tightness, shortness of breath and wheezing.   Cardiovascular:  Negative for chest pain and palpitations.  Gastrointestinal:  Negative for abdominal pain and anal bleeding.  Genitourinary:  Negative for dyspareunia, dysuria and frequency.  Musculoskeletal:  Positive for myalgias. Negative for back pain.       Some since last week. But less now.  Skin:  Negative for rash.  Neurological:  Negative for syncope, facial asymmetry, weakness and light-headedness.  Hematological:  Negative for adenopathy. Does not bruise/bleed easily.  Psychiatric/Behavioral:  Negative for behavioral problems and decreased concentration.     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 Georga Kaufmann ([facility]);  dx 10/ 2014  DCIS left breast , ER/PR+;  07-22-2013 s/p  left breast partial mastectomy w/ sln dissection's and  08-12-2013 s/p left breast lumpecotmy (excision another site);  completed radiation 02/ 2015   Headache    Hyperlipidemia    Hypothyroidism    Insomnia    Mobitz type 2 second  degree heart block 03/12/2020   w/ symptomatic bradycardia, rate 30s;  s/p  PPM 03-14-2020   Myalgia, multiple sites    Pre-diabetes    Symptomatic bradycardia 03/12/2020   s/p PPM   Wears glasses    Wears partial dentures    upper and lower     Social History   Socioeconomic History   Marital status: Legally Separated    Spouse name: Not on file   Number of children: Not on file   Years of education: Not on file   Highest education level: Not on file  Occupational History   Not on file  Tobacco Use   Smoking status: Never   Smokeless tobacco: Never  Vaping Use   Vaping status: Never Used  Substance and Sexual Activity   Alcohol use: Yes    Comment: socially   Drug use: Never   Sexual activity: Yes    Birth control/protection: Post-menopausal, Surgical  Other Topics Concern   Not on file  Social History Narrative   Not on file   Social Determinants of Health   Financial Resource Strain: Low Risk  (02/22/2022)   Overall Financial Resource Strain (CARDIA)    Difficulty of Paying Living Expenses: Not hard at all  Food Insecurity: No Food Insecurity (02/22/2022)   Hunger Vital Sign    Worried About Running Out of Food in the Last Year: Never true    Ran Out of Food in the Last Year: Never true  Transportation Needs: No Transportation Needs (02/22/2022)   PRAPARE - Administrator, Civil Service (Medical): No    Lack of Transportation (Non-Medical): No  Physical Activity: Insufficiently Active (02/22/2022)   Exercise Vital Sign    Days of Exercise per Week: 2 days    Minutes of Exercise per Session: 60 min  Stress: No Stress Concern Present (02/22/2022)   [employer] of Occupational Health - Occupational Stress Questionnaire    Feeling of Stress : Not at all  Social Connections: Unknown (02/22/2022)   Social Connection and Isolation Panel [NHANES]    Frequency of Communication with Friends and Family: More than three times a week    Frequency of Social  Gatherings with Friends and Family: More than three times a week    Attends Religious Services: Not on file    Active Member of Clubs or Organizations: Yes    Attends Banker Meetings: More than 4 times per year    Marital Status: Separated  Intimate Partner Violence: Not At Risk (02/22/2022)   Humiliation, Afraid, Rape, and Kick questionnaire    Fear of Current or Ex-Partner: No    Emotionally Abused: No    Physically Abused: No    Sexually Abused: No    Past Surgical History:  Procedure Laterality Date   Quintella Reichert OSTEOTOMY Right 10/15/2020   Procedure: Quintella Reichert OSTEOTOMY;  Surgeon: Edwin Cap, DPM;  Location: [facility];  Service: Podiatry;  Laterality: Right;  REGIONAL BLOCK   AIKEN OSTEOTOMY Left 02/24/2022   Procedure: Quintella Reichert OSTEOTOMY;  Surgeon: Edwin Cap, DPM;  Location: [facility];  Service: Podiatry;  Laterality: Left;   ANTERIOR AND POSTERIOR REPAIR N/A 02/18/2015   Procedure: ANTERIOR (CYSTOCELE) AND POSTERIOR REPAIR (RECTOCELE);  Surgeon: Osborn Coho, MD;  Location: [facility];  Service: Gynecology;  Laterality: N/A;   BLADDER SUSPENSION N/A 02/18/2015   Procedure: TRANSVAGINAL TAPE (TVT) PROCEDURE;  Surgeon: Osborn Coho, MD;  Location: [facility];  Service: Gynecology;  Laterality: N/A;   CYSTOSCOPY Bilateral 02/18/2015   Procedure: CYSTOSCOPY;  Surgeon: Osborn Coho, MD;  Location: [facility];  Service: Gynecology;  Laterality: Bilateral;   GRAFT APPLICATION Left 02/24/2022   Procedure: BONE GRAFT;  Surgeon: Edwin Cap, DPM;  Location: [facility];  Service: Podiatry;  Laterality: Left;   HALLUX VALGUS LAPIDUS Right 10/15/2020   Procedure: HALLUX VALGUS LAPIDUS AND BONE GRAFT;  Surgeon: Edwin Cap, DPM;  Location: [facility] [place];  Service: Podiatry;  Laterality: Right;  WITH IV SEDATION; REGIONAL BLOCK   HALLUX VALGUS LAPIDUS Left 02/24/2022   Procedure: HALLUX VALGUS LAPIDUS;  Surgeon: Edwin Cap, DPM;  Location: [facility];  Service: Podiatry;  Laterality: Left;   HAMMER TOE SURGERY Right 10/15/2020   Procedure: HAMMER TOE CORRECTION;  Surgeon: Edwin Cap, DPM;  Location: [facility];  Service: Podiatry;  Laterality: Right;  REGIONAL BLOCK   HAMMER TOE SURGERY Left 02/24/2022   Procedure: HAMMER TOE CORRECTION SECOND TOE LEFT FOOT;  Surgeon: Edwin Cap, DPM;  Location: [facility];  Service: Podiatry;  Laterality: Left;   LAPAROSCOPIC ASSISTED VAGINAL HYSTERECTOMY N/A 02/18/2015   Procedure: LAPAROSCOPIC ASSISTED VAGINAL HYSTERECTOMY;  Surgeon: Osborn Coho, MD;  Location: [facility];  Service: Gynecology;  Laterality: N/A;   MASTECTOMY, PARTIAL Left 08-12-2013  @[facility]    excision another site   PACEMAKER IMPLANT N/A 03/14/2020   Procedure: PACEMAKER IMPLANT;  Surgeon: Regan Lemming, MD;  Location: [facility];  Service: Cardiovascular;  Laterality: N/A;   RADIOACTIVE SEED GUIDED PARTIAL MASTECTOMY/AXILLARY SENTINEL NODE BIOPSY/AXILLARY NODE DISSECTION Left 07-22-2013  @[facility]    SALPINGOOPHORECTOMY Bilateral 02/18/2015   Procedure: BILATERAL SALPINGO OOPHORECTOMY;  Surgeon: Osborn Coho, MD;  Location: [facility];  Service: Gynecology;  Laterality: Bilateral;    Family History  Problem Relation Age of Onset   Heart disease Mother    Cancer Father        prostata   Cancer Sister 36       Breast    No Known Allergies  Current Outpatient Medications on File Prior to Visit  Medication Sig Dispense Refill   aspirin EC 325 MG tablet Take 162 mg by mouth daily.     B Complex Vitamins (VITAMIN B COMPLEX PO) Take 1 tablet by mouth daily.     levothyroxine (SYNTHROID) 75 MCG tablet Take 1 tablet (75 mcg total) by mouth daily before breakfast. 90 tablet 2   Omega 3 1000 MG CAPS Take 1,000 mg by mouth daily.     ondansetron (ZOFRAN) 4 MG tablet Take 1 tablet (4 mg total) by mouth every 8 (eight) hours as needed for nausea  or vomiting. 20 tablet 0   No current facility-administered medications on file prior to visit.    BP 100/70   Pulse 68   Resp 18   Ht 5\' 3"  (1.6 m)   Wt 149 lb 6.4 oz (67.8 kg)   SpO2 95%   BMI 26.47 kg/m        Objective:   Physical Exam  General Mental Status- Alert. General Appearance- Not in acute distress.   Skin General: Color- Normal Color. Moisture- Normal Moisture.  Neck Carotid Arteries- Normal color. Moisture- Normal Moisture. No carotid bruits. No JVD.  Chest and Lung Exam Auscultation: Breath Sounds:-Normal.  Cardiovascular Auscultation:Rythm- Regular. Murmurs & Other Heart Sounds:Auscultation of the heart reveals- No Murmurs.  Abdomen Inspection:-Inspeection Normal. Palpation/Percussion:Note: faint suprapubic tenderness. No mass. Palpation and Percussion of the abdomen reveal- Non Tender, Non Distended + BS, no rebound or guarding.  Neurologic Cranial Nerve exam:- CN III-XII intact(No nystagmus), symmetric smile. Strength:- 5/5 equal and symmetric strength both upper and lower extremities.   Back- no cva tenderness.  Heent- no sinus pressure. Faint posteror pharynx pinkish red. No hypertrophy. No exudate.     Assessment & Plan:   Patient Instructions  1. Vaginal itching/likely yeast infection Based on description will rx diflucan one tab po for one day. If signs/symptoms persist then will need vaginal swab testing.  2. Sore throat Rapid strep negative. Symptoms improving since one week out and test negative think viral pharyngitis. Exam reassuring/negative. I don't think antibiotic indicated and if were to give could make yeast infection worse.  3. Myalgia Resolving as well. Think viral syndrome related.  4. Suprapubic pressure - POCT Urinalysis Dipstick (Automated) - Urine Culture   Follow up in 10- 14 days or sooner if needed.

## 2023-04-01 MED ORDER — AMPICILLIN 500 MG PO CAPS: 500.0000 mg | ORAL_CAPSULE | Freq: Three times a day (TID) | ORAL | 0 refills | Status: DC

## 2023-04-01 NOTE — Addendum Note (Signed)
Addended by: Gwenevere Abbot on: 04/01/2023 06:56 PM   Modules accepted: Orders

## 2023-04-02 ENCOUNTER — Telehealth: Payer: Self-pay | Admitting: Medical

## 2023-04-02 NOTE — Telephone Encounter (Signed)
Copied from CRM [id number]. Topic: Medicare AWV >> Apr 02, 2023 10:08 AM Payton Doughty wrote: Reason for CRM: LM 04/02/2023 to schedule AWV   Verlee Rossetti; Care Guide Ambulatory Clinical Support [facility] Direct Dial: [phone]

## 2023-04-19 DIAGNOSIS — H524 Presbyopia: Secondary | ICD-10-CM | POA: Diagnosis not present

## 2023-04-19 DIAGNOSIS — H04123 Dry eye syndrome of bilateral lacrimal glands: Secondary | ICD-10-CM | POA: Diagnosis not present

## 2023-04-19 DIAGNOSIS — H40011 Open angle with borderline findings, low risk, right eye: Secondary | ICD-10-CM | POA: Diagnosis not present

## 2023-04-19 DIAGNOSIS — H401223 Low-tension glaucoma, left eye, severe stage: Secondary | ICD-10-CM | POA: Diagnosis not present

## 2023-04-20 ENCOUNTER — Ambulatory Visit: Payer: 59

## 2023-04-20 DIAGNOSIS — I442 Atrioventricular block, complete: Secondary | ICD-10-CM

## 2023-04-20 LAB — CUP PACEART REMOTE DEVICE CHECK
Battery Remaining Longevity: 93 mo
Battery Voltage: 2.99 V
Brady Statistic AP VP Percent: 26.88 %
Brady Statistic AP VS Percent: 0.84 %
Brady Statistic AS VP Percent: 63.42 %
Brady Statistic AS VS Percent: 8.87 %
Brady Statistic RA Percent Paced: 27.71 %
Brady Statistic RV Percent Paced: 90.3 %
Date Time Interrogation Session: 20240816032743
Implantable Lead Connection Status: 753985
Implantable Lead Connection Status: 753985
Implantable Lead Implant Date: 20210711
Implantable Lead Implant Date: 20210711
Implantable Lead Location: 753859
Implantable Lead Location: 753860
Implantable Lead Model: 5076
Implantable Lead Model: 5076
Implantable Pulse Generator Implant Date: 20210711
Lead Channel Impedance Value: 323 Ohm
Lead Channel Impedance Value: 342 Ohm
Lead Channel Impedance Value: 361 Ohm
Lead Channel Impedance Value: 589 Ohm
Lead Channel Pacing Threshold Amplitude: 0.75 V
Lead Channel Pacing Threshold Amplitude: 0.75 V
Lead Channel Pacing Threshold Pulse Width: 0.4 ms
Lead Channel Pacing Threshold Pulse Width: 0.4 ms
Lead Channel Sensing Intrinsic Amplitude: 3.75 mV
Lead Channel Sensing Intrinsic Amplitude: 3.75 mV
Lead Channel Sensing Intrinsic Amplitude: 4.125 mV
Lead Channel Sensing Intrinsic Amplitude: 4.125 mV
Lead Channel Setting Pacing Amplitude: 1.5 V
Lead Channel Setting Pacing Amplitude: 2.5 V
Lead Channel Setting Pacing Pulse Width: 0.4 ms
Lead Channel Setting Sensing Sensitivity: 1.2 mV
Zone Setting Status: 755011
Zone Setting Status: 755011

## 2023-04-30 NOTE — Progress Notes (Signed)
Remote pacemaker transmission.   

## 2023-05-22 ENCOUNTER — Ambulatory Visit: Payer: 59 | Admitting: Family Medicine

## 2023-05-25 ENCOUNTER — Ambulatory Visit: Payer: 59 | Admitting: Family Medicine

## 2023-05-25 DIAGNOSIS — E785 Hyperlipidemia, unspecified: Secondary | ICD-10-CM

## 2023-05-25 DIAGNOSIS — E039 Hypothyroidism, unspecified: Secondary | ICD-10-CM

## 2023-06-01 ENCOUNTER — Ambulatory Visit (INDEPENDENT_AMBULATORY_CARE_PROVIDER_SITE_OTHER): Payer: 59 | Admitting: Family Medicine

## 2023-06-01 ENCOUNTER — Encounter: Payer: Self-pay | Admitting: Family Medicine

## 2023-06-01 VITALS — BP 136/80 | HR 64 | Temp 97.8°F | Resp 16 | Ht 63.0 in | Wt 151.4 lb

## 2023-06-01 DIAGNOSIS — Z1211 Encounter for screening for malignant neoplasm of colon: Secondary | ICD-10-CM | POA: Diagnosis not present

## 2023-06-01 DIAGNOSIS — R7303 Prediabetes: Secondary | ICD-10-CM | POA: Diagnosis not present

## 2023-06-01 DIAGNOSIS — E785 Hyperlipidemia, unspecified: Secondary | ICD-10-CM

## 2023-06-01 DIAGNOSIS — Z23 Encounter for immunization: Secondary | ICD-10-CM | POA: Diagnosis not present

## 2023-06-01 DIAGNOSIS — E039 Hypothyroidism, unspecified: Secondary | ICD-10-CM | POA: Diagnosis not present

## 2023-06-01 DIAGNOSIS — Z0000 Encounter for general adult medical examination without abnormal findings: Secondary | ICD-10-CM

## 2023-06-01 DIAGNOSIS — Z780 Asymptomatic menopausal state: Secondary | ICD-10-CM

## 2023-06-01 DIAGNOSIS — I700 Atherosclerosis of aorta: Secondary | ICD-10-CM | POA: Diagnosis not present

## 2023-06-01 LAB — COMPREHENSIVE METABOLIC PANEL
ALT: 22 U/L (ref 0–35)
AST: 26 U/L (ref 0–37)
Albumin: 4.2 g/dL (ref 3.5–5.2)
Alkaline Phosphatase: 98 U/L (ref 39–117)
BUN: 16 mg/dL (ref 6–23)
CO2: 31 meq/L (ref 19–32)
Calcium: 9.4 mg/dL (ref 8.4–10.5)
Chloride: 103 meq/L (ref 96–112)
Creatinine, Ser: 0.65 mg/dL (ref 0.40–1.20)
GFR: 89.75 mL/min (ref >60.00)
Glucose, Bld: 109 mg/dL — ABNORMAL HIGH (ref 70–99)
Potassium: 4.1 meq/L (ref 3.5–5.1)
Sodium: 142 meq/L (ref 135–145)
Total Bilirubin: 0.4 mg/dL (ref 0.2–1.2)
Total Protein: 7.2 g/dL (ref 6.0–8.3)

## 2023-06-01 LAB — LIPID PANEL
Cholesterol: 247 mg/dL — ABNORMAL HIGH (ref 0–200)
HDL: 48.4 mg/dL (ref >39.00)
LDL Cholesterol: 154 mg/dL — ABNORMAL HIGH (ref 0–99)
NonHDL: 198.19
Total CHOL/HDL Ratio: 5
Triglycerides: 219 mg/dL — ABNORMAL HIGH (ref 0.0–149.0)
VLDL: 43.8 mg/dL — ABNORMAL HIGH (ref 0.0–40.0)

## 2023-06-01 LAB — TSH: TSH: 5.33 u[IU]/mL (ref 0.35–5.50)

## 2023-06-01 LAB — T4, FREE: Free T4: 0.66 ng/dL (ref 0.60–1.60)

## 2023-06-01 LAB — HEMOGLOBIN A1C: Hgb A1c MFr Bld: 6.4 % (ref 4.6–6.5)

## 2023-06-01 MED ORDER — LEVOTHYROXINE SODIUM 75 MCG PO TABS: 75.0000 ug | ORAL_TABLET | Freq: Every day | ORAL | 3 refills | Status: DC

## 2023-06-01 NOTE — Assessment & Plan Note (Signed)
She is not on a statin medication. Recommend decreasing Aspirin from 325 mg to 81 mg. Further recommendation will be given according to lipid panel result.

## 2023-06-01 NOTE — Assessment & Plan Note (Signed)
She is not on pharmacologic treatment. Last LDL at 160 in 10/2020. Further recommendation will be given according to lipid panel result.

## 2023-06-01 NOTE — Assessment & Plan Note (Signed)
Encouraged consistency with a healthy lifestyle for diabetes prevention. Last hemoglobin A1c was 6.4 in 02/2022.

## 2023-06-01 NOTE — Assessment & Plan Note (Signed)
Problem has been adequately controlled. Last TSH was 4.8 in 02/2022. Continue levothyroxine 75 mcg daily. Further recommendation will be given according to TSH result.

## 2023-06-01 NOTE — Assessment & Plan Note (Addendum)
We discussed the importance of regular physical activity and healthy diet for prevention of chronic illness and/or complications. Preventive guidelines reviewed. Vaccination: Prevnar 20 and influenza vaccine given today.  Recommend getting Shingrix at her pharmacy. Ca++ and vit D supplementation to continue. DEXA will be arranged. Instructed to call the breast center to schedule her mammogram. GI referral placed to discuss colonoscopy. Next CPE in a year.

## 2023-06-01 NOTE — Progress Notes (Signed)
HPI: [name] is a 67 y.o. female with PMHx significant for CHB s/p pacemaker placement, OA, HLD, prediabetes, and hypothyroidism here today for follow up and her routine physical.  Last CPE: Over a year ago. -- She hasn't had a physical in over a year, so we decided to do a physical today.   Exercise: She is walking and stretching regularly at home. Also stays active at work (part-time) for about 4 hours.   Diet: Tends to eat vegetables often, and avoids fatty or high sodium foods.  Sleep: 6 hours  Smoking: Former smoker in her from 23-mid 30s.  Alcohol consumption: Only drinks on special occasions.  Vision: She last followed up for routine vision care about 3 months ago.  Dental: She follows up with her dentist for routine cleanings.   Immunization History  Administered Date(s) Administered   Fluad Quad(high Dose 65+) 08/20/2019   Fluad Trivalent(High Dose 65+) 06/01/2023   Influenza, Seasonal, Injecte, Preservative Fre 07/16/2012   Influenza-Unspecified 06/17/2013   PNEUMOCOCCAL CONJUGATE-20 06/01/2023   Pneumococcal Polysaccharide-23 08/20/2019   Tdap 02/28/2023   Health Maintenance  Topic Date Due   Zoster Vaccines- Shingrix (1 of 2) Never done   Colonoscopy  Never done   MAMMOGRAM  03/02/2021   Medicare Annual Wellness (AWV)  02/23/2023   DTaP/Tdap/Td (2 - Td or Tdap) 02/27/2033   Pneumonia Vaccine 16+ Years old  Completed   INFLUENZA VACCINE  Completed   DEXA SCAN  Completed   Hepatitis C Screening  Completed   HPV VACCINES  Aged Out   COVID-19 Vaccine  Discontinued   Epistaxis: Has still not been seen by ENT, but states her condition has resolved since her last visit.   History of complete AV block, status post pacemaker placement: She is established with cardiology, and has pacemaker remote follow ups scheduled on 07/20/23 and 10/19/23. She is supposed to follow annually, not sure about her next OV. She is compliant with aspirin 325 mg daily, which was  part of her post-op regimen, had left foot surgery a few months ago..   -Managing her hypothyroidism with synthroid 75 mcg daily.  Lab Results  Component Value Date   TSH 4.83 02/06/2022   -Hyperlipidemia: Currently she is on nonpharmacologic treatment. Aortic atherosclerosis has been seen on imaging in the past, chest x-ray in 03/2020. Lab Results  Component Value Date   CHOL 232 (H) 10/14/2020   HDL 52.30 10/14/2020   LDLCALC 160 (H) 10/14/2020   TRIG 101.0 10/14/2020   CHOLHDL 4 10/14/2020   -Prediabetes: Negative for polydipsia, polyuria, polyphagia. Lab Results  Component Value Date   HGBA1C 6.4 02/16/2022   Review of Systems  Constitutional:  Negative for activity change, appetite change and fever.  HENT:  Negative for mouth sores, nosebleeds, sore throat and trouble swallowing.   Eyes:  Negative for redness and visual disturbance.  Respiratory:  Negative for cough, shortness of breath and wheezing.   Cardiovascular:  Negative for chest pain and leg swelling.  Gastrointestinal:  Negative for abdominal pain, nausea and vomiting.       No changes in bowel habits.  Endocrine: Negative for cold intolerance, heat intolerance, polydipsia, polyphagia and polyuria.  Genitourinary:  Negative for decreased urine volume, dysuria and hematuria.  Musculoskeletal:  Negative for gait problem and myalgias.  Skin:  Negative for color change and rash.  Allergic/Immunologic: Positive for environmental allergies.  Neurological:  Negative for seizures, syncope, weakness and headaches.  Psychiatric/Behavioral:  Negative for confusion. The patient  is not nervous/anxious.   All other systems reviewed and are negative.  Current Outpatient Medications on File Prior to Visit  Medication Sig Dispense Refill   aspirin EC 325 MG tablet Take 162 mg by mouth daily.     B Complex Vitamins (VITAMIN B COMPLEX PO) Take 1 tablet by mouth daily.     levothyroxine (SYNTHROID) 75 MCG tablet Take 1 tablet (75  mcg total) by mouth daily before breakfast. 90 tablet 2   Omega 3 1000 MG CAPS Take 1,000 mg by mouth daily.     No current facility-administered medications on file prior to visit.   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   w/ symptomatic bradycardia, rate 30s;  s/p  PPM 03-14-2020   Myalgia, multiple sites    Pre-diabetes    Symptomatic bradycardia 03/12/2020   s/p PPM   Wears glasses    Wears partial dentures    upper and lower    Past Surgical History:  Procedure Laterality Date   Quintella Reichert OSTEOTOMY Right 10/15/2020   Procedure: Quintella Reichert OSTEOTOMY;  Surgeon: Edwin Cap, DPM;  Location: [facility];  Service: Podiatry;  Laterality: Right;  REGIONAL BLOCK   AIKEN OSTEOTOMY Left 02/24/2022   Procedure: Quintella Reichert OSTEOTOMY;  Surgeon: Edwin Cap, DPM;  Location: [facility];  Service: Podiatry;  Laterality: Left;   ANTERIOR AND POSTERIOR REPAIR N/A 02/18/2015   Procedure: ANTERIOR (CYSTOCELE) AND POSTERIOR REPAIR (RECTOCELE);  Surgeon: Osborn Coho, MD;  Location: [facility];  Service: Gynecology;  Laterality: N/A;   BLADDER SUSPENSION N/A 02/18/2015   Procedure: TRANSVAGINAL TAPE (TVT) PROCEDURE;  Surgeon: Osborn Coho, MD;  Location: [facility];  Service: Gynecology;  Laterality: N/A;   CYSTOSCOPY Bilateral 02/18/2015    Procedure: CYSTOSCOPY;  Surgeon: Osborn Coho, MD;  Location: [facility];  Service: Gynecology;  Laterality: Bilateral;   GRAFT APPLICATION Left 02/24/2022   Procedure: BONE GRAFT;  Surgeon: Edwin Cap, DPM;  Location: [facility] [place];  Service: Podiatry;  Laterality: Left;   HALLUX VALGUS LAPIDUS Right 10/15/2020   Procedure: HALLUX VALGUS LAPIDUS AND BONE GRAFT;  Surgeon: Edwin Cap, DPM;  Location: [facility];  Service: Podiatry;  Laterality: Right;  WITH IV SEDATION; REGIONAL BLOCK   HALLUX VALGUS LAPIDUS Left 02/24/2022   Procedure: HALLUX VALGUS LAPIDUS;  Surgeon: Edwin Cap, DPM;  Location: [facility] [place];  Service: Podiatry;  Laterality: Left;   HAMMER TOE SURGERY Right 10/15/2020   Procedure: HAMMER TOE CORRECTION;  Surgeon: Edwin Cap, DPM;  Location: [facility] ;  Service: Podiatry;  Laterality: Right;  REGIONAL BLOCK   HAMMER TOE SURGERY Left 02/24/2022   Procedure: HAMMER TOE CORRECTION SECOND TOE LEFT FOOT;  Surgeon: Edwin Cap, DPM;  Location: [facility];  Service: Podiatry;  Laterality: Left;   LAPAROSCOPIC ASSISTED VAGINAL HYSTERECTOMY N/A 02/18/2015   Procedure: LAPAROSCOPIC ASSISTED VAGINAL HYSTERECTOMY;  Surgeon: Osborn Coho, MD;  Location: [facility];  Service: Gynecology;  Laterality: N/A;   MASTECTOMY, PARTIAL Left 08-12-2013  @[facility]    excision another site   PACEMAKER IMPLANT N/A 03/14/2020   Procedure: PACEMAKER IMPLANT;  Surgeon: Regan Lemming, MD;  Location: [facility];  Service: Cardiovascular;  Laterality: N/A;   RADIOACTIVE SEED GUIDED PARTIAL MASTECTOMY/AXILLARY SENTINEL NODE BIOPSY/AXILLARY NODE DISSECTION Left 07-22-2013  @[facility]    SALPINGOOPHORECTOMY Bilateral 02/18/2015   Procedure: BILATERAL SALPINGO OOPHORECTOMY;  Surgeon: Osborn Coho, MD;  Location: [facility];  Service: Gynecology;  Laterality: Bilateral;   No Known Allergies  Family History  Problem  Relation Age of Onset   Heart disease Mother    Cancer Father        prostata   Cancer Sister 21       Breast   Social History   Socioeconomic History   Marital status: Legally Separated    Spouse name: Not on file   Number of children: Not on file   Years of education: Not on file   Highest education level: Not on file  Occupational History   Not on file  Tobacco Use   Smoking status: Never   Smokeless tobacco: Never  Vaping Use   Vaping status: Never Used  Substance and Sexual Activity   Alcohol use: Yes    Comment: socially   Drug use: Never   Sexual activity: Yes    Birth control/protection: Post-menopausal, Surgical  Other Topics Concern   Not on file  Social History Narrative   Not on file   Social Determinants of Health   Financial Resource Strain: Low Risk  (02/22/2022)   Overall Financial Resource Strain (CARDIA)    Difficulty of Paying Living Expenses: Not hard at all  Food Insecurity: No Food Insecurity (02/22/2022)   Hunger Vital Sign    Worried About Running Out of Food in the Last Year: Never true    Ran Out of Food in the Last Year: Never true  Transportation Needs: No Transportation Needs (02/22/2022)   PRAPARE - Administrator, Civil Service (Medical): No    Lack of Transportation (Non-Medical): No  Physical Activity: Insufficiently Active (02/22/2022)   Exercise Vital Sign    Days of Exercise per Week: 2 days    Minutes of Exercise per Session: 60 min  Stress: No Stress Concern Present (02/22/2022)   [employer] of Occupational Health - Occupational Stress Questionnaire    Feeling of Stress : Not at all  Social Connections: Unknown (02/22/2022)   Social Connection and Isolation Panel [NHANES]    Frequency of Communication with Friends and Family: More than three times a week    Frequency of Social Gatherings with Friends and Family: More than three times a week    Attends Religious Services: Not on file    Active Member of Clubs  or Organizations: Yes    Attends Banker Meetings: More than 4 times per year    Marital Status: Separated   Vitals:   06/01/23 0655  BP: 136/80  Pulse: 64  Resp: 16  Temp: 97.8 F (36.6 C)  SpO2: 96%   Body mass index is 26.81 kg/m.  Wt Readings from Last 3 Encounters:  06/01/23 151 lb 6 oz (68.7 kg)  03/30/23 149 lb 6.4 oz (67.8 kg)  02/27/23 145  lb (65.8 kg)   Physical Exam Vitals and nursing note reviewed.  Constitutional:      General: She is not in acute distress.    Appearance: She is well-developed.  HENT:     Head: Normocephalic and atraumatic.     Right Ear: External ear normal.     Left Ear: Tympanic membrane, ear canal and external ear normal.     Ears:     Comments: Cerumen excess right ear canal, could not see TM.    Mouth/Throat:     Mouth: Mucous membranes are moist.     Pharynx: Oropharynx is clear. Uvula midline.  Eyes:     Extraocular Movements: Extraocular movements intact.     Conjunctiva/sclera: Conjunctivae normal.     Pupils: Pupils are equal, round, and reactive to light.  Neck:     Thyroid: No thyroid mass.  Cardiovascular:     Rate and Rhythm: Normal rate and regular rhythm.     Pulses:          Dorsalis pedis pulses are 2+ on the right side and 2+ on the left side.     Heart sounds: No murmur heard. Pulmonary:     Effort: Pulmonary effort is normal. No respiratory distress.     Breath sounds: Normal breath sounds.  Abdominal:     Palpations: Abdomen is soft. There is no hepatomegaly or mass.     Tenderness: There is no abdominal tenderness.  Genitourinary:    Comments: No concerns today. Musculoskeletal:     Comments: No major deformity or signs of synovitis appreciated.  Lymphadenopathy:     Cervical: No cervical adenopathy.  Skin:    General: Skin is warm.     Findings: No erythema or rash.  Neurological:     General: No focal deficit present.     Mental Status: She is alert and oriented to person, place, and  time.     Cranial Nerves: No cranial nerve deficit.     Coordination: Coordination normal.     Gait: Gait normal.     Deep Tendon Reflexes:     Reflex Scores:      Bicep reflexes are 2+ on the right side and 2+ on the left side.      Patellar reflexes are 2+ on the right side and 2+ on the left side. Psychiatric:        Mood and Affect: Mood and affect normal.     ASSESSMENT AND PLAN: [name] was here today annual physical examination.  Orders Placed This Encounter  Procedures   DG Bone Density   Flu Vaccine Trivalent High Dose (Fluad)   Pneumococcal conjugate vaccine 20-valent (Prevnar 20)   Comprehensive metabolic panel   TSH   T4, free   Hemoglobin A1c   Lipid panel   Ambulatory referral to Gastroenterology   Lab Results  Component Value Date   TSH 5.33 06/01/2023   Lab Results  Component Value Date   CHOL 247 (H) 06/01/2023   HDL 48.40 06/01/2023   LDLCALC 154 (H) 06/01/2023   TRIG 219.0 (H) 06/01/2023   CHOLHDL 5 06/01/2023   Lab Results  Component Value Date   HGBA1C 6.4 06/01/2023   Lab Results  Component Value Date   NA 142 06/01/2023   CL 103 06/01/2023   K 4.1 06/01/2023   CO2 31 06/01/2023   BUN 16 06/01/2023   CREATININE 0.65 06/01/2023   GFR 89.75 06/01/2023   CALCIUM 9.4 06/01/2023  ALBUMIN 4.2 06/01/2023   GLUCOSE 109 (H) 06/01/2023   Lab Results  Component Value Date   ALT 22 06/01/2023   AST 26 06/01/2023   ALKPHOS 98 06/01/2023   BILITOT 0.4 06/01/2023   Routine general medical examination at a health care facility Assessment & Plan: We discussed the importance of regular physical activity and healthy diet for prevention of chronic illness and/or complications. Preventive guidelines reviewed. Vaccination: Prevnar 20 and influenza vaccine given today.  Recommend getting Shingrix at her pharmacy. Ca++ and vit D supplementation to continue. DEXA will be arranged. Instructed to call the breast center to schedule her  mammogram. GI referral placed to discuss colonoscopy. Next CPE in a year.   Acquired hypothyroidism Assessment & Plan: Problem has been adequately controlled. Last TSH was 4.8 in 02/2022. Continue levothyroxine 75 mcg daily. Further recommendation will be given according to TSH result.  Orders: -     TSH; Future -     T4, free; Future  Atherosclerosis of aorta [facility]) Assessment & Plan: She is not on a statin medication. Recommend decreasing Aspirin from 325 mg to 81 mg. Further recommendation will be given according to lipid panel result.  Orders: -     Lipid panel; Future  Colon cancer screening -     Ambulatory referral to Gastroenterology  Prediabetes Assessment & Plan: Encouraged consistency with a healthy lifestyle for diabetes prevention. Last hemoglobin A1c was 6.4 in 02/2022.  Orders: -     Comprehensive metabolic panel; Future -     Hemoglobin A1c; Future  Need for influenza vaccination -     Flu Vaccine Trivalent High Dose (Fluad)  Need for pneumococcal vaccination -     Pneumococcal conjugate vaccine 20-valent  Asymptomatic postmenopausal estrogen deficiency -     DG Bone Density; Future  Hyperlipidemia, unspecified hyperlipidemia type Assessment & Plan: She is not on pharmacologic treatment. Last LDL at 160 in 10/2020. Further recommendation will be given according to lipid panel result.   Return in 1 year (on 05/31/2024) for CPE, Labs.  I,Rachel Rivera,acting as a scribe for Rajesh Wyss [place], MD.,have documented all relevant documentation on the behalf of Tarisa Paola [place], MD,as directed by  Mardel Grudzien [place], MD while in the presence of Masud Holub [place], MD.  I, Alyxis Grippi [place], MD, have reviewed all documentation for this visit. The documentation on 06/01/23 for the exam, diagnosis, procedures, and orders are all accurate and complete.  Olanda Boughner G. [place], MD  [facility]. [place] office.

## 2023-06-01 NOTE — Patient Instructions (Addendum)
A few things to remember from today's visit:  Routine general medical examination at a health care facility  Acquired hypothyroidism - Plan: TSH, T4, free  Atherosclerosis of aorta (HCC) - Plan: Lipid panel  Colon cancer screening - Plan: Ambulatory referral to Gastroenterology  Prediabetes - Plan: Comprehensive metabolic panel, Hemoglobin A1c  Cardiologist's [phone] . Llame a pedir la cita para su mamograma.  If you need refills for medications you take chronically, please call your pharmacy. Do not use My Chart to request refills or for acute issues that need immediate attention. If you send a my chart message, it may take a few days to be addressed, specially if I am not in the office.  Please be sure medication list is accurate. If a new problem present, please set up appointment sooner than planned today.

## 2023-06-04 ENCOUNTER — Other Ambulatory Visit: Payer: Self-pay

## 2023-06-04 MED ORDER — ROSUVASTATIN CALCIUM 10 MG PO TABS: 10.0000 mg | ORAL_TABLET | Freq: Every day | ORAL | 3 refills | Status: DC

## 2023-07-11 DIAGNOSIS — Z1231 Encounter for screening mammogram for malignant neoplasm of breast: Secondary | ICD-10-CM | POA: Diagnosis not present

## 2023-07-11 DIAGNOSIS — D0510 Intraductal carcinoma in situ of unspecified breast: Secondary | ICD-10-CM | POA: Diagnosis not present

## 2023-07-20 ENCOUNTER — Ambulatory Visit (INDEPENDENT_AMBULATORY_CARE_PROVIDER_SITE_OTHER): Payer: 59

## 2023-07-20 DIAGNOSIS — I442 Atrioventricular block, complete: Secondary | ICD-10-CM | POA: Diagnosis not present

## 2023-07-21 LAB — CUP PACEART REMOTE DEVICE CHECK
Battery Remaining Longevity: 91 mo
Battery Voltage: 2.99 V
Brady Statistic AP VP Percent: 28.38 %
Brady Statistic AP VS Percent: 1.01 %
Brady Statistic AS VP Percent: 56.52 %
Brady Statistic AS VS Percent: 14.09 %
Brady Statistic RA Percent Paced: 29.39 %
Brady Statistic RV Percent Paced: 84.9 %
Date Time Interrogation Session: 20241114205825
Implantable Lead Connection Status: 753985
Implantable Lead Connection Status: 753985
Implantable Lead Implant Date: 20210711
Implantable Lead Implant Date: 20210711
Implantable Lead Location: 753859
Implantable Lead Location: 753860
Implantable Lead Model: 5076
Implantable Lead Model: 5076
Implantable Pulse Generator Implant Date: 20210711
Lead Channel Impedance Value: 323 Ohm
Lead Channel Impedance Value: 361 Ohm
Lead Channel Impedance Value: 361 Ohm
Lead Channel Impedance Value: 589 Ohm
Lead Channel Pacing Threshold Amplitude: 0.75 V
Lead Channel Pacing Threshold Amplitude: 0.875 V
Lead Channel Pacing Threshold Pulse Width: 0.4 ms
Lead Channel Pacing Threshold Pulse Width: 0.4 ms
Lead Channel Sensing Intrinsic Amplitude: 3.375 mV
Lead Channel Sensing Intrinsic Amplitude: 3.375 mV
Lead Channel Sensing Intrinsic Amplitude: 4.375 mV
Lead Channel Sensing Intrinsic Amplitude: 4.375 mV
Lead Channel Setting Pacing Amplitude: 1.75 V
Lead Channel Setting Pacing Amplitude: 2.5 V
Lead Channel Setting Pacing Pulse Width: 0.4 ms
Lead Channel Setting Sensing Sensitivity: 1.2 mV
Zone Setting Status: 755011
Zone Setting Status: 755011

## 2023-08-01 NOTE — Progress Notes (Signed)
Remote pacemaker transmission.   

## 2023-08-23 ENCOUNTER — Ambulatory Visit (AMBULATORY_SURGERY_CENTER): Payer: 59 | Admitting: *Deleted

## 2023-08-23 VITALS — Ht 63.0 in | Wt 153.0 lb

## 2023-08-23 DIAGNOSIS — H353121 Nonexudative age-related macular degeneration, left eye, early dry stage: Secondary | ICD-10-CM | POA: Diagnosis not present

## 2023-08-23 DIAGNOSIS — H40011 Open angle with borderline findings, low risk, right eye: Secondary | ICD-10-CM | POA: Diagnosis not present

## 2023-08-23 DIAGNOSIS — H401223 Low-tension glaucoma, left eye, severe stage: Secondary | ICD-10-CM | POA: Diagnosis not present

## 2023-08-23 DIAGNOSIS — H353211 Exudative age-related macular degeneration, right eye, with active choroidal neovascularization: Secondary | ICD-10-CM | POA: Diagnosis not present

## 2023-08-23 DIAGNOSIS — Z1211 Encounter for screening for malignant neoplasm of colon: Secondary | ICD-10-CM

## 2023-08-23 MED ORDER — NA SULFATE-K SULFATE-MG SULF 17.5-3.13-1.6 GM/177ML PO SOLN
1.0000 | Freq: Once | ORAL | 0 refills | Status: AC
Start: 2023-08-23 — End: 2023-08-23

## 2023-08-23 NOTE — Progress Notes (Signed)
Pt's name and DOB verified at the beginning of the pre-visit wit 2 identifiers  Pt denies any difficulty with ambulating,sitting, laying down or rolling side to side  Pt has no issues with ambulation   Pt has no issues moving head neck or swallowing  No egg or soy allergy known to patient   No issues known to pt with past sedation with any surgeries or procedures  Pt denies having issues being intubated  Patient denies ever being intubated  No FH of Malignant Hyperthermia  Pt is not on diet pills or shots  Pt is not on home 02   Pt is not on blood thinners   Pt denies issues with constipation   Pt has frequent issues with constipation RN instructed pt to use Miralax per bottles instructions a week before prep days. Pt states they will  Pt is not on dialysis  Pt denise any abnormal heart rhythms   Pt denies any upcoming cardiac testing  Pt encouraged to use to use Singlecare or Goodrx to reduce cost   Patient's chart reviewed by Cathlyn Parsons CNRA prior to pre-visit and patient appropriate for the [facility].  Pre-visit completed and red dot placed by patient's name on their procedure day (on provider's schedule).  .  Visit in person Interpretor for Spanish  Pt scale weight is 153 lb  Instructed pt why it is important to and  to call if they have any changes in health or new medications. Directed them to the # given and on instructions.     Instructions reviewed. Pt given both [facility] main # and MD on call # prior to instructions.  Pt states understanding. Instructed to review again prior to procedure. Pt states they will.   Instructions and coupon given to pt

## 2023-09-03 ENCOUNTER — Encounter: Payer: Self-pay | Admitting: Internal Medicine

## 2023-09-03 ENCOUNTER — Ambulatory Visit: Payer: 59 | Admitting: Internal Medicine

## 2023-09-03 VITALS — BP 164/80 | HR 60 | Temp 97.5°F | Resp 13 | Ht 63.0 in | Wt 153.0 lb

## 2023-09-03 DIAGNOSIS — K648 Other hemorrhoids: Secondary | ICD-10-CM

## 2023-09-03 DIAGNOSIS — D123 Benign neoplasm of transverse colon: Secondary | ICD-10-CM

## 2023-09-03 DIAGNOSIS — D122 Benign neoplasm of ascending colon: Secondary | ICD-10-CM

## 2023-09-03 DIAGNOSIS — E785 Hyperlipidemia, unspecified: Secondary | ICD-10-CM | POA: Diagnosis not present

## 2023-09-03 DIAGNOSIS — Z1211 Encounter for screening for malignant neoplasm of colon: Secondary | ICD-10-CM

## 2023-09-03 DIAGNOSIS — K5730 Diverticulosis of large intestine without perforation or abscess without bleeding: Secondary | ICD-10-CM

## 2023-09-03 MED ORDER — SODIUM CHLORIDE 0.9 % IV SOLN: 500.0000 mL | Freq: Once | INTRAVENOUS | Status: DC

## 2023-09-03 NOTE — Patient Instructions (Addendum)
Folleto educativo proporcionado al paciente relacionado con hemorroides, plipos y diverticulosis.  Reanudar la dieta anterior  Continuar con los medicamentos actuales.  A la espera de [employer].   USTED TUVO UN PROCEDIMIENTO ENDOSCPICO HOY EN EL [facility]:   Lea el informe del procedimiento que se le entreg para cualquier pregunta especfica sobre lo que se Dentist.  Si el informe del examen no responde a sus preguntas, por favor llame a su gastroenterlogo para aclararlo.  Si usted solicit que no se le den [employer] de lo que se Clinical cytogeneticist en su procedimiento al [employer] va a cuidar, entonces el informe del procedimiento se ha incluido en un sobre sellado para que usted lo revise despus cuando le sea ms conveniente.   LO QUE PUEDE ESPERAR: Algunas sensaciones de hinchazn en el abdomen.  Puede tener ms gases de lo normal.  El caminar puede ayudarle a eliminar el aire que se le puso en el tracto gastrointestinal durante el procedimiento y reducir la hinchazn.  Si le hicieron una endoscopia inferior (como una colonoscopia o una sigmoidoscopia flexible), podra notar manchas de sangre en las heces fecales o en el papel higinico.  Si se someti a una preparacin intestinal para su procedimiento, es posible que no tenga una evacuacin intestinal normal durante [employer].   Tenga en cuenta:  Es posible que note un poco de irritacin y congestin en la nariz o algn drenaje.  Esto es debido al oxgeno [employer] durante su procedimiento.  No hay que preocuparse y esto debe desaparecer ms o Regulatory affairs officer.   SNTOMAS PARA REPORTAR INMEDIATAMENTE:   Despus de una endoscopia inferior (colonoscopia o sigmoidoscopia flexible):  Cantidades excesivas de sangre en las heces fecales  Sensibilidad significativa o empeoramiento de los dolores abdominales   Hinchazn aguda del abdomen que antes no tena   Fiebre de 100F o ms   Para asuntos  urgentes o de Associate Professor, puede comunicarse con un gastroenterlogo a cualquier hora llamando al [phone].  DIETA:  Recomendamos una comida pequea al principio, pero luego puede continuar con su dieta normal.  Tome muchos lquidos, Tax adviser las bebidas alcohlicas durante 24 horas.    ACTIVIDAD:  Debe planear tomarse las cosas con calma por el resto del da y no debe CONDUCIR ni usar maquinaria pesada Patent examiner (debido a los medicamentos de sedacin utilizados durante el examen).     SEGUIMIENTO: Nuestro personal llamar al nmero que aparece en su historial al siguiente da hbil de su procedimiento para ver cmo se siente y para responder cualquier pregunta o inquietud que pueda tener con respecto a la informacin que se le dio despus del procedimiento. Si no podemos contactarle, le dejaremos un mensaje.  Sin embargo, si se siente bien y no tiene English as a second language teacher, no es necesario que nos devuelva la llamada.  Asumiremos que ha regresado a sus actividades diarias normales sin incidentes. Si se le tomaron algunas biopsias, le contactaremos por telfono o por carta en las prximas 3 semanas.  Si no ha sabido [employer] biopsias en el transcurso de 3 semanas, por favor llmenos al [phone].   FIRMAS/CONFIDENCIALIDAD: Usted y/o el acompaante que le cuide han firmado documentos que se ingresarn en su historial mdico electrnico.  Estas firmas atestiguan el hecho de que la informacin anterior     YOU HAD AN ENDOSCOPIC PROCEDURE TODAY AT THE [facility]:   Refer to the procedure report that was  given to you for any specific questions about what was found during the examination.  If the procedure report does not answer your questions, please call your gastroenterologist to clarify.  If you requested that your care partner not be given the details of your procedure findings, then the procedure report has been included in a sealed envelope for you to review at your  convenience later.  YOU SHOULD EXPECT: Some feelings of bloating in the abdomen. Passage of more gas than usual.  Walking can help get rid of the air that was put into your GI tract during the procedure and reduce the bloating. If you had a lower endoscopy (such as a colonoscopy or flexible sigmoidoscopy) you may notice spotting of blood in your stool or on the toilet paper. If you underwent a bowel prep for your procedure, you may not have a normal bowel movement for a few days.  Please Note:  You might notice some irritation and congestion in your nose or some drainage.  This is from the oxygen used during your procedure.  There is no need for concern and it should clear up in a day or so.  SYMPTOMS TO REPORT IMMEDIATELY:  Following lower endoscopy (colonoscopy or flexible sigmoidoscopy):  Excessive amounts of blood in the stool  Significant tenderness or worsening of abdominal pains  Swelling of the abdomen that is new, acute  Fever of 100F or higher  For urgent or emergent issues, a gastroenterologist can be reached at any hour by calling [phone]. Do not use MyChart messaging for urgent concerns.    DIET:  We do recommend a small meal at first, but then you may proceed to your regular diet.  Drink plenty of fluids but you should avoid alcoholic beverages for 24 hours.  ACTIVITY:  You should plan to take it easy for the rest of today and you should NOT DRIVE or use heavy machinery until tomorrow (because of the sedation medicines used during the test).    FOLLOW UP: Our staff will call the number listed on your records the next business day following your procedure.  We will call around 7:15- 8:00 am to check on you and address any questions or concerns that you may have regarding the information given to you following your procedure. If we do not reach you, we will leave a message.     If any biopsies were taken you will be contacted by phone or by letter within the next 1-3  weeks.  Please call us at [phone] if you have not heard about the biopsies in 3 weeks.    SIGNATURES/CONFIDENTIALITY: You and/or your care partner have signed paperwork which will be entered into your electronic medical record.  These signatures attest to the fact that that the information above on your After Visit Summary has been reviewed and is understood.  Full responsibility of the confidentiality of this discharge information lies with you and/or your care-partner.

## 2023-09-03 NOTE — Op Note (Signed)
[facility] Patient Name: Tiffany Berg Procedure Date: 09/03/2023 4:03 PM MRN: [id number] Endoscopist: Tiffany Berg [place] , , [id number] Age: 67 Referring MD:  Date of Birth: 1956/02/26 Gender: Female Account #: [id number] Procedure:                Colonoscopy Indications:              Screening for colorectal malignant neoplasm, This                            is the patient's first colonoscopy Medicines:                Monitored Anesthesia Care Procedure:                Pre-Anesthesia Assessment:                           - Prior to the procedure, a History and Physical                            was performed, and patient medications and                            allergies were reviewed. The patient's tolerance of                            previous anesthesia was also reviewed. The risks                            and benefits of the procedure and the sedation                            options and risks were discussed with the patient.                            All questions were answered, and informed consent                            was obtained. Prior Anticoagulants: The patient has                            taken no anticoagulant or antiplatelet agents. ASA                            Grade Assessment: III - A patient with severe                            systemic disease. After reviewing the risks and                            benefits, the patient was deemed in satisfactory                            condition to undergo the procedure.  After obtaining informed consent, the colonoscope                            was passed under direct vision. Throughout the                            procedure, the patient's blood pressure, pulse, and                            oxygen saturations were monitored continuously. The                            CF [id number] #[id number] was introduced through the anus                            and advanced  to the the terminal ileum. The                            colonoscopy was performed without difficulty. The                            patient tolerated the procedure well. The quality                            of the bowel preparation was good. The terminal                            ileum, ileocecal valve, appendiceal orifice, and                            rectum were photographed. Scope In: 4:15:28 PM Scope Out: 4:33:19 PM Scope Withdrawal Time: 0 hours 12 minutes 53 seconds  Total Procedure Duration: 0 hours 17 minutes 51 seconds  Findings:                 The terminal ileum appeared normal.                           Three sessile polyps were found in the transverse                            colon and ascending colon. The polyps were 3 to 5                            mm in size. These polyps were removed with a cold                            snare. Resection and retrieval were complete.                           Multiple diverticula were found in the sigmoid                            colon, descending colon and transverse colon.  Non-bleeding internal hemorrhoids were found during                            retroflexion. Complications:            No immediate complications. Estimated Blood Loss:     Estimated blood loss was minimal. Impression:               - The examined portion of the ileum was normal.                           - Three 3 to 5 mm polyps in the transverse colon                            and in the ascending colon, removed with a cold                            snare. Resected and retrieved.                           - Diverticulosis in the sigmoid colon, in the                            descending colon and in the transverse colon.                           - Non-bleeding internal hemorrhoids. Recommendation:           - Discharge patient to home (with escort).                           - Await pathology results.                            - The findings and recommendations were discussed                            with the patient. Dr Tiffany Berg "Tiffany Berg" Tiffany Berg,  09/03/2023 4:37:05 PM

## 2023-09-03 NOTE — Progress Notes (Signed)
To pacu, VSS. Report to [email or web address] 

## 2023-09-03 NOTE — Progress Notes (Signed)
Interpreter used today at the [facility] for this pt.  Interpreter's name is- Myra  Cell phone off per pt   Pt's states no medical or surgical changes since previsit or office visit.

## 2023-09-04 ENCOUNTER — Telehealth: Payer: Self-pay | Admitting: *Deleted

## 2023-09-04 NOTE — Telephone Encounter (Signed)
Post procedure follow up call placed, no answer and left VM.  

## 2023-09-04 NOTE — Progress Notes (Signed)
 [facility] - Clinic Note  09/18/2023   CHIEF COMPLAINT Patient presents for Retina Evaluation  HISTORY OF PRESENT ILLNESS: Tiffany Berg is a 67 y.o. female who presents to the clinic today for:  HPI     Retina Evaluation   In both eyes.  This started 3 months ago.  Duration of 3 months.  Associated Symptoms Floaters.  Context:  distance vision, mid-range vision and near vision.  I, the attending physician,  performed the HPI with the patient and updated documentation appropriately.        Comments   Retina eval per Dr Fleeta PT saw her 3 months ago and is reporting that she has trouble seeing phriephrial vision she has noticed some floaters but denies any flashes pt states she is pre diabetic and is not currently checking her blood sugar at this time       Last edited by Valdemar Rogue, MD on 09/18/2023  4:26 PM.    Pt is here on the referral of Dr. Fleeta for concern of exu ARMD OD, pt states she went to see Dr. Fleeta for a routine eye exam, she feels like her vision is not very good, pt is also diabetic, her last A1c was 6.4 on 09.27.24   Referring physician: Fleeta Zerita DASEN, MD [address] [place],  [place] [address]  HISTORICAL INFORMATION:  Selected notes from the MEDICAL RECORD NUMBER Referred by Dr. Fleeta for ARMD eval LEE:  Ocular Hx- PMH-   CURRENT MEDICATIONS: No current outpatient medications on file. (Ophthalmic Drugs)   No current facility-administered medications for this visit. (Ophthalmic Drugs)   Current Outpatient Medications (Other)  Medication Sig   aspirin  EC 81 MG tablet Take 81 mg by mouth daily. Swallow whole.   B Complex Vitamins (VITAMIN B COMPLEX PO) Take 1 tablet by mouth daily.   Cyanocobalamin (VITAMIN B 12) 100 MCG LOZG Take by mouth.   doxylamine, Sleep, (UNISOM) 25 MG tablet Take 25 mg by mouth at bedtime as needed. As need   levothyroxine  (SYNTHROID ) 75 MCG tablet Take 1 tablet (75 mcg total) by mouth daily before  breakfast.   Omega 3 1000 MG CAPS Take 1,000 mg by mouth daily.   OVER THE COUNTER MEDICATION Vitamin D 3 1000 mg   rosuvastatin  (CRESTOR ) 10 MG tablet Take 1 tablet (10 mg total) by mouth daily.   No current facility-administered medications for this visit. (Other)   REVIEW OF SYSTEMS: ROS   Positive for: Gastrointestinal, Endocrine, Cardiovascular, Eyes Last edited by Resa Delon ORN, COT on 09/18/2023  8:30 AM.     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   w/ symptomatic bradycardia, rate 30s;  s/p  PPM 03-14-2020   Myalgia, multiple  sites    Pre-diabetes    Symptomatic bradycardia 03/12/2020   s/p PPM   Wears glasses    Wears partial dentures    upper and lower   Past Surgical History:  Procedure Laterality Date   KATRINA OSTEOTOMY Right 10/15/2020   Procedure: KATRINA OSTEOTOMY;  Surgeon: Silva Juliene SAUNDERS, DPM;  Location: [facility];  Service: Podiatry;  Laterality: Right;  REGIONAL BLOCK   AIKEN OSTEOTOMY Left 02/24/2022   Procedure: KATRINA OSTEOTOMY;  Surgeon: Silva Juliene SAUNDERS, DPM;  Location: [facility];  Service: Podiatry;  Laterality: Left;   ANTERIOR AND POSTERIOR REPAIR N/A 02/18/2015   Procedure: ANTERIOR (CYSTOCELE) AND POSTERIOR REPAIR  (RECTOCELE);  Surgeon: Jon Rummer, MD;  Location: [facility];  Service: Gynecology;  Laterality: N/A;   BLADDER SUSPENSION N/A 02/18/2015   Procedure: TRANSVAGINAL TAPE (TVT) PROCEDURE;  Surgeon: Jon Rummer, MD;  Location: [facility];  Service: Gynecology;  Laterality: N/A;   COLONOSCOPY     pt states she might have had one many years ago   CYSTOSCOPY Bilateral 02/18/2015   Procedure: CYSTOSCOPY;  Surgeon: Jon Rummer, MD;  Location: [facility];  Service: Gynecology;  Laterality: Bilateral;   GRAFT APPLICATION Left 02/24/2022   Procedure: BONE GRAFT;  Surgeon: Silva Juliene SAUNDERS, DPM;  Location: [facility];  Service: Podiatry;  Laterality: Left;   HALLUX VALGUS LAPIDUS Right 10/15/2020   Procedure: HALLUX VALGUS LAPIDUS AND BONE GRAFT;  Surgeon: Silva Juliene SAUNDERS, DPM;  Location: [facility] [place];  Service: Podiatry;  Laterality: Right;  WITH IV SEDATION; REGIONAL BLOCK   HALLUX VALGUS LAPIDUS Left 02/24/2022   Procedure: HALLUX VALGUS LAPIDUS;  Surgeon: Silva Juliene SAUNDERS, DPM;  Location[facility];  Service: Podiatry;  Laterality: Left;   HAMMER TOE SURGERY Right 10/15/2020   Procedure: HAMMER TOE CORRECTION;  Surgeon: Silva Juliene SAUNDERS, DPM;  Location: [facility];  Service: Podiatry;  Laterality: Right;  REGIONAL BLOCK   HAMMER TOE SURGERY Left 02/24/2022   Procedure: HAMMER TOE CORRECTION SECOND TOE LEFT FOOT;  Surgeon: Silva Juliene SAUNDERS, DPM;  Location: [facility];  Service: Podiatry;  Laterality: Left;   LAPAROSCOPIC ASSISTED VAGINAL HYSTERECTOMY N/A 02/18/2015   Procedure: LAPAROSCOPIC ASSISTED VAGINAL HYSTERECTOMY;  Surgeon: Jon Rummer, MD;  Location: [facility];  Service: Gynecology;  Laterality: N/A;   MASTECTOMY, PARTIAL Left 08-12-2013  @[facility]    excision another site   PACEMAKER IMPLANT N/A 03/14/2020   Procedure: PACEMAKER IMPLANT;  Surgeon: Inocencio Soyla Lunger, MD;  Location: [facility];  Service:  Cardiovascular;  Laterality: N/A;   RADIOACTIVE SEED GUIDED PARTIAL MASTECTOMY/AXILLARY SENTINEL NODE BIOPSY/AXILLARY NODE DISSECTION Left 07-22-2013  @[facility]    SALPINGOOPHORECTOMY Bilateral 02/18/2015   Procedure: BILATERAL SALPINGO OOPHORECTOMY;  Surgeon: Jon Rummer, MD;  Location: [facility];  Service: Gynecology;  Laterality: Bilateral;   FAMILY HISTORY Family History  Problem Relation Age of Onset   Heart disease Mother    Colon cancer Father    Cancer Father        prostata   Stomach cancer Sister    Cancer Sister 5       Breast   Esophageal cancer Neg Hx    Rectal cancer Neg Hx    SOCIAL HISTORY Social History   Tobacco Use   Smoking status: Never   Smokeless tobacco: Never  Vaping Use   Vaping status: Never Used  Substance Use Topics   Alcohol use: Yes    Comment: socially  Drug use: Never       OPHTHALMIC EXAM:  Base Eye Exam     Visual Acuity (Snellen - Linear)       Right Left   Dist cc 20/25 20/25 -2   Dist ph cc NI NI    Correction: Glasses         Tonometry (Tonopen, 8:38 AM)       Right Left   Pressure 15 13         Pupils       Pupils Dark Light Shape React APD   Right PERRL 3 2 Round Brisk None   Left PERRL 3 2 Round Brisk None         Visual Fields       Left Right    Full Full         Extraocular Movement       Right Left    Full, Ortho Full, Ortho         Neuro/Psych     Oriented x3: Yes   Mood/Affect: Normal         Dilation     Both eyes: 2.5% Phenylephrine  @ 8:38 AM           Slit Lamp and Fundus Exam     Slit Lamp Exam       Right Left   Lids/Lashes Dermatochalasis - upper lid Dermatochalasis - upper lid   Conjunctiva/Sclera mild nasal pterygium mild nasal pterygium   Cornea nasal pterygium, trace tear film debris nasal pterygium, trace tear film debris   Anterior Chamber deep and clear deep and clear   Iris Round and dilated, No NVI Round and dilated, No NVI   Lens 2+ Nuclear  sclerosis, 2-3+ Cortical cataract 2+ Nuclear sclerosis, 2+ Cortical cataract   Anterior Vitreous mild syneresis mild syneresis         Fundus Exam       Right Left   Disc Pink and Sharp, Compact Pink and Sharp, Compact   C/D Ratio 0.4 0.4   Macula Flat, Blunted foveal reflex, focal CNV with shallow SRF and edema ST macula, mild drusen, no frank heme Flat, Good foveal reflex, fine drusen, No heme or edema   Vessels attenuated, mild tortuosity attenuated, mild tortuosity   Periphery Attached, No heme Attached, No heme           IMAGING AND PROCEDURES  Imaging and Procedures for 09/18/2023  OCT, Retina - OU - Both Eyes       Right Eye Quality was good. Central Foveal Thickness: 250. Progression has no prior data. Findings include normal foveal contour, no IRF, retinal drusen , subretinal hyper-reflective material, pigment epithelial detachment, subretinal fluid (Focal PED with shallow SRF ST to fovea, partial PVD).   Left Eye Quality was good. Central Foveal Thickness: 247. Progression has no prior data. Findings include normal foveal contour, no IRF, no SRF, retinal drusen .   Notes *Images captured and stored on drive  Diagnosis / Impression:  OD: Focal PED/CNV with shallow SRF ST to fovea, partial PVD OS: NFP, no IRF/SRF  Clinical management:  See below  Abbreviations: NFP - Normal foveal profile. CME - cystoid macular edema. PED - pigment epithelial detachment. IRF - intraretinal fluid. SRF - subretinal fluid. EZ - ellipsoid zone. ERM - epiretinal membrane. ORA - outer retinal atrophy. ORT - outer retinal tubulation. SRHM - subretinal hyper-reflective material. IRHM - intraretinal hyper-reflective material      Fluorescein  Angiography Optos (Transit OD)  Right Eye Progression has no prior data. Early phase findings include staining, choroidal neovascularization. Mid/Late phase findings include leakage, staining, choroidal neovascularization (Focal CNV with  leakage superior fovea).   Left Eye Progression has no prior data. Early phase findings include normal observations. Mid/Late phase findings include normal observations (No CNV).   Notes **Images stored on drive**  Impression: OD: Focal CNV with leakage superior fovea -- exudative ARMD OS: normal study, no CNV      Intravitreal Injection, Pharmacologic Agent - OD - Right Eye       Time Out 09/18/2023. 10:31 AM. Confirmed correct patient, procedure, site, and patient consented.   Anesthesia Topical anesthesia was used. Anesthetic medications included Lidocaine  2%, Proparacaine 0.5%.   Procedure Preparation included 5% betadine to ocular surface, eyelid speculum. A supplied needle was used.   Injection: 1.25 mg Bevacizumab  1.25mg /0.59ml   Route: Intravitreal, Site: Right Eye   NDC: [id number], Lot: [id number], Expiration date: 10/18/2023   Post-op Post injection exam found visual acuity of at least counting fingers. The patient tolerated the procedure well. There were no complications. The patient received written and verbal post procedure care education.           ASSESSMENT/PLAN:   ICD-10-CM   1. Exudative age-related macular degeneration of right eye with active choroidal neovascularization (HCC)  H35.3211 OCT, Retina - OU - Both Eyes    Intravitreal Injection, Pharmacologic Agent - OD - Right Eye    Bevacizumab  (AVASTIN ) SOLN 1.25 mg    2. Intermediate stage nonexudative age-related macular degeneration of left eye  H35.3122     3. Diabetes mellitus type 2 without retinopathy (HCC)  E11.9     4. Essential hypertension  I10     5. Hypertensive retinopathy of both eyes  H35.033 Fluorescein  Angiography Optos (Transit OD)    6. Combined forms of age-related cataract of both eyes  H25.813      Exudative age related macular degeneration OD  - The incidence pathology and anatomy of wet AMD discussed   - The ANCHOR, MARINA , CATT and VIEW trials discussed with patient.     - discussed treatment options including observation vs intravitreal anti-VEGF agents such as Avastin , Lucentis, Eylea.    - Risks of endophthalmitis and vascular occlusive events and atrophic changes discussed with patient  - OCT shows focal PED/CNV with shallow SRF ST to fovea  - FA (01.14.24) shows foval CNV with leakage superior fovea  - BCVA OD 20/25  - recommend IVA OD #1 today, 01.14.25  - pt wishes to be treated with IVA  - RBA of procedure discussed, questions answered - IVA informed consent obtained and signed, 01.14.25 (OD) - see procedure note - f/u in 4 wks, DFE, OCT, possible injxn  2. Age related macular degeneration, non-exudative, OS  - The incidence, anatomy, and pathology of dry AMD, risk of progression, and the AREDS and AREDS 2 study including smoking risks discussed with patient.  - Recommend amsler grid monitoring  - monitor  3. Diabetes mellitus, type 2 without retinopathy - The incidence, risk factors for progression, natural history and treatment options for diabetic retinopathy  were discussed with patient.   - The need for close monitoring of blood glucose, blood pressure, and serum lipids, avoiding cigarette or any type of tobacco, and the need for long term follow up was also discussed with patient. - f/u in 1 year, sooner prn  4,5. Hypertensive retinopathy OU - discussed importance of tight BP control - monitor  6.  Mixed Cataract OU - The symptoms of cataract, surgical options, and treatments and risks were discussed with patient. - discussed diagnosis and progression - monitor   Ophthalmic Meds Ordered this visit:  Meds ordered this encounter  Medications   Bevacizumab  (AVASTIN ) SOLN 1.25 mg     Return in about 4 weeks (around 10/16/2023) for f/u exu ARMD OD, DFE, OCT, Possible Injxn.  There are no Patient Instructions on file for this visit.  Explained the diagnoses, plan, and follow up with the patient and they expressed understanding.   Patient expressed understanding of the importance of proper follow up care.   This document serves as a record of services personally performed by Redell JUDITHANN Hans, MD, PhD. It was created on their behalf by Wanda GEANNIE Keens, COT an ophthalmic technician. The creation of this record is the provider's dictation and/or activities during the visit.    Electronically signed by:  Wanda GEANNIE Keens, COT  09/18/23 4:30 PM  This document serves as a record of services personally performed by Redell JUDITHANN Hans, MD, PhD. It was created on their behalf by Alan PARAS. Delores, OA an ophthalmic technician. The creation of this record is the provider's dictation and/or activities during the visit.    Electronically signed by: Alan PARAS. Delores, OA 09/18/23 4:30 PM  Redell JUDITHANN Hans, M.D., Ph.D. Diseases & Surgery of the Retina and Vitreous [facility] 09/18/2023  I have reviewed the above documentation for accuracy and completeness, and I agree with the above. Redell JUDITHANN Hans, M.D., Ph.D. 09/18/23 4:31 PM   Abbreviations: M myopia (nearsighted); A astigmatism; H hyperopia (farsighted); P presbyopia; Mrx spectacle prescription;  CTL contact lenses; OD right eye; OS left eye; OU both eyes  XT exotropia; ET esotropia; PEK punctate epithelial keratitis; PEE punctate epithelial erosions; DES dry eye syndrome; MGD meibomian gland dysfunction; ATs artificial tears; PFAT's preservative free artificial tears; NSC nuclear sclerotic cataract; PSC posterior subcapsular cataract; ERM epi-retinal membrane; PVD posterior vitreous detachment; RD retinal detachment; DM diabetes mellitus; DR diabetic retinopathy; NPDR non-proliferative diabetic retinopathy; PDR proliferative diabetic retinopathy; CSME clinically significant macular edema; DME diabetic macular edema; dbh dot blot hemorrhages; CWS cotton wool spot; POAG primary open angle glaucoma; C/D cup-to-disc ratio; HVF humphrey visual field; GVF goldmann  visual field; OCT optical coherence tomography; IOP intraocular pressure; BRVO Branch retinal vein occlusion; CRVO central retinal vein occlusion; CRAO central retinal artery occlusion; BRAO branch retinal artery occlusion; RT retinal tear; SB scleral buckle; PPV pars plana vitrectomy; VH Vitreous hemorrhage; PRP panretinal laser photocoagulation; IVK intravitreal kenalog; VMT vitreomacular traction; MH Macular hole;  NVD neovascularization of the disc; NVE neovascularization elsewhere; AREDS age related eye disease study; ARMD age related macular degeneration; POAG primary open angle glaucoma; EBMD epithelial/anterior basement membrane dystrophy; ACIOL anterior chamber intraocular lens; IOL intraocular lens; PCIOL posterior chamber intraocular lens; Phaco/IOL phacoemulsification with intraocular lens placement; PRK photorefractive keratectomy; LASIK laser assisted in situ keratomileusis; HTN hypertension; DM diabetes mellitus; COPD chronic obstructive pulmonary disease

## 2023-09-10 ENCOUNTER — Encounter: Payer: Self-pay | Admitting: Internal Medicine

## 2023-09-10 LAB — SURGICAL PATHOLOGY

## 2023-09-18 ENCOUNTER — Ambulatory Visit (INDEPENDENT_AMBULATORY_CARE_PROVIDER_SITE_OTHER): Payer: 59 | Admitting: Ophthalmology

## 2023-09-18 ENCOUNTER — Encounter (INDEPENDENT_AMBULATORY_CARE_PROVIDER_SITE_OTHER): Payer: Self-pay | Admitting: Ophthalmology

## 2023-09-18 DIAGNOSIS — H353122 Nonexudative age-related macular degeneration, left eye, intermediate dry stage: Secondary | ICD-10-CM | POA: Diagnosis not present

## 2023-09-18 DIAGNOSIS — H353211 Exudative age-related macular degeneration, right eye, with active choroidal neovascularization: Secondary | ICD-10-CM

## 2023-09-18 DIAGNOSIS — H35033 Hypertensive retinopathy, bilateral: Secondary | ICD-10-CM | POA: Diagnosis not present

## 2023-09-18 DIAGNOSIS — H3581 Retinal edema: Secondary | ICD-10-CM

## 2023-09-18 DIAGNOSIS — E119 Type 2 diabetes mellitus without complications: Secondary | ICD-10-CM | POA: Diagnosis not present

## 2023-09-18 DIAGNOSIS — I10 Essential (primary) hypertension: Secondary | ICD-10-CM | POA: Diagnosis not present

## 2023-09-18 DIAGNOSIS — H25813 Combined forms of age-related cataract, bilateral: Secondary | ICD-10-CM

## 2023-09-18 MED ORDER — BEVACIZUMAB CHEMO INJECTION 1.25MG/0.05ML SYRINGE FOR KALEIDOSCOPE
1.2500 mg | INTRAVITREAL | Status: AC | PRN
  Administered 2023-09-18: 1.25 mg via INTRAVITREAL

## 2023-10-04 NOTE — Progress Notes (Signed)
 [facility] - Clinic Note  10/16/2023   CHIEF COMPLAINT Patient presents for Retina Follow Up  HISTORY OF PRESENT ILLNESS: Tiffany Berg is a 68 y.o. female who presents to the clinic today for:  HPI     Retina Follow Up   Patient presents with  Wet AMD.  In right eye.  This started 4 weeks ago.  Duration of 4 weeks.  I, the attending physician,  performed the HPI with the patient and updated documentation appropriately.        Comments   Patient feels the vision is the same. She is using AT's.       Last edited by Rennis Chris, MD on 10/16/2023 12:23 PM.    Pt states she had no problems after the first injection, she feels like the vision is worse in the left eye than the right eye  Referring physician: Diona Foley, MD [address] [place],  [place] [address]  HISTORICAL INFORMATION:  Selected notes from the MEDICAL RECORD NUMBER Referred by Dr. Zenaida Niece for ARMD eval LEE:  Ocular Hx- PMH-   CURRENT MEDICATIONS: No current outpatient medications on file. (Ophthalmic Drugs)   No current facility-administered medications for this visit. (Ophthalmic Drugs)   Current Outpatient Medications (Other)  Medication Sig   aspirin EC 81 MG tablet Take 81 mg by mouth daily. Swallow whole.   B Complex Vitamins (VITAMIN B COMPLEX PO) Take 1 tablet by mouth daily.   Cyanocobalamin (VITAMIN B 12) 100 MCG LOZG Take by mouth.   doxylamine, Sleep, (UNISOM) 25 MG tablet Take 25 mg by mouth at bedtime as needed. As need   levothyroxine (SYNTHROID) 75 MCG tablet Take 1 tablet (75 mcg total) by mouth daily before breakfast.   Omega 3 1000 MG CAPS Take 1,000 mg by mouth daily.   OVER THE COUNTER MEDICATION Vitamin D 3 1000 mg   rosuvastatin (CRESTOR) 10 MG tablet Take 1 tablet (10 mg total) by mouth daily.   No current facility-administered medications for this visit. (Other)   REVIEW OF SYSTEMS: ROS   Positive for: Gastrointestinal, Endocrine, Cardiovascular,  Eyes Last edited by Charlette Caffey, COT on 10/16/2023  8:33 AM.      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   w/ symptomatic bradycardia, rate 30s;  s/p  PPM 03-14-2020   Myalgia, multiple sites    Pre-diabetes    Symptomatic bradycardia 03/12/2020   s/p PPM   Wears glasses    Wears partial dentures    upper and lower   Past Surgical History:  Procedure Laterality Date   Quintella Reichert OSTEOTOMY Right 10/15/2020   Procedure: Quintella Reichert OSTEOTOMY;  Surgeon: Edwin Cap, DPM;  Location: [facility] [place];  Service:  Podiatry;  Laterality: Right;  REGIONAL BLOCK   AIKEN OSTEOTOMY Left 02/24/2022   Procedure: Quintella Reichert OSTEOTOMY;  Surgeon: Edwin Cap, DPM;  Location: [facility] [place];  Service: Podiatry;  Laterality: Left;   ANTERIOR AND POSTERIOR REPAIR N/A 02/18/2015   Procedure: ANTERIOR (CYSTOCELE) AND POSTERIOR REPAIR (RECTOCELE);  Surgeon: Osborn Coho, MD;  Location: [facility];  Service: Gynecology;  Laterality: N/A;   BLADDER SUSPENSION N/A 02/18/2015   Procedure: TRANSVAGINAL TAPE (TVT) PROCEDURE;  Surgeon: Osborn Coho, MD;  Location: [facility];  Service: Gynecology;  Laterality: N/A;   COLONOSCOPY     pt states she might have had one many years ago   CYSTOSCOPY  Bilateral 02/18/2015   Procedure: CYSTOSCOPY;  Surgeon: Osborn Coho, MD;  Location: [facility];  Service: Gynecology;  Laterality: Bilateral;   GRAFT APPLICATION Left 02/24/2022   Procedure: BONE GRAFT;  Surgeon: Edwin Cap, DPM;  Location: [facility];  Service: Podiatry;  Laterality: Left;   HALLUX VALGUS LAPIDUS Right 10/15/2020   Procedure: HALLUX VALGUS LAPIDUS AND BONE GRAFT;  Surgeon: Edwin Cap, DPM;  Location: [facility];  Service: Podiatry;  Laterality: Right;  WITH IV SEDATION; REGIONAL BLOCK   HALLUX VALGUS LAPIDUS Left 02/24/2022   Procedure: HALLUX VALGUS LAPIDUS;  Surgeon: Edwin Cap, DPM;  Location: [facility];  Service: Podiatry;  Laterality: Left;   HAMMER TOE SURGERY Right 10/15/2020   Procedure: HAMMER TOE CORRECTION;  Surgeon: Edwin Cap, DPM;  Location: [facility] [place];  Service: Podiatry;  Laterality: Right;  REGIONAL BLOCK   HAMMER TOE SURGERY Left 02/24/2022   Procedure: HAMMER TOE CORRECTION SECOND TOE LEFT FOOT;  Surgeon: Edwin Cap, DPM;  Location: [facility] [place];  Service: Podiatry;  Laterality: Left;   LAPAROSCOPIC ASSISTED VAGINAL HYSTERECTOMY N/A 02/18/2015   Procedure: LAPAROSCOPIC ASSISTED VAGINAL HYSTERECTOMY;  Surgeon: Osborn Coho, MD;  Location: [facility];  Service: Gynecology;  Laterality: N/A;   MASTECTOMY, PARTIAL Left 08-12-2013  @[facility]    excision another site   PACEMAKER IMPLANT N/A 03/14/2020   Procedure: PACEMAKER IMPLANT;  Surgeon: Regan Lemming, MD;  Location: [facility];  Service: Cardiovascular;  Laterality: N/A;   RADIOACTIVE SEED GUIDED PARTIAL MASTECTOMY/AXILLARY SENTINEL NODE BIOPSY/AXILLARY NODE DISSECTION Left 07-22-2013  @[facility]    SALPINGOOPHORECTOMY Bilateral 02/18/2015   Procedure: BILATERAL SALPINGO OOPHORECTOMY;  Surgeon: Osborn Coho, MD;  Location: [facility];  Service: Gynecology;  Laterality: Bilateral;   FAMILY  HISTORY Family History  Problem Relation Age of Onset   Heart disease Mother    Colon cancer Father    Cancer Father        prostata   Stomach cancer Sister    Cancer Sister 47       Breast   Esophageal cancer Neg Hx    Rectal cancer Neg Hx    SOCIAL HISTORY Social History   Tobacco Use   Smoking status: Never   Smokeless tobacco: Never  Vaping Use   Vaping status: Never Used  Substance Use Topics   Alcohol use: Yes    Comment: socially   Drug use: Never       OPHTHALMIC EXAM:  Base Eye Exam     Visual Acuity (Snellen - Linear)       Right Left   Dist cc 20/25 20/25   Dist ph cc NI NI    Correction: Glasses         Tonometry (Tonopen, 8:36 AM)  Right Left   Pressure 16 13         Pupils       Dark Light Shape React APD   Right 3 2 Round Brisk None   Left 3 2 Round Brisk None         Visual Fields       Left Right    Full Full         Extraocular Movement       Right Left    Full, Ortho Full, Ortho         Neuro/Psych     Oriented x3: Yes   Mood/Affect: Normal         Dilation     Both eyes: 1.0% Mydriacyl @ 8:33 AM           Slit Lamp and Fundus Exam     Slit Lamp Exam       Right Left   Lids/Lashes Dermatochalasis - upper lid Dermatochalasis - upper lid   Conjunctiva/Sclera mild nasal pterygium mild nasal pterygium   Cornea nasal pterygium, trace tear film debris nasal pterygium, trace tear film debris   Anterior Chamber deep and clear deep and clear   Iris Round and dilated, No NVI Round and dilated, No NVI   Lens 2+ Nuclear sclerosis, 2-3+ Cortical cataract 2+ Nuclear sclerosis, 2+ Cortical cataract   Anterior Vitreous mild syneresis mild syneresis         Fundus Exam       Right Left   Disc Pink and Sharp, Compact Pink and Sharp, Compact   C/D Ratio 0.4 0.4   Macula Flat, Blunted foveal reflex, focal CNV with shallow SRF and edema ST macula -- slightly improved, mild drusen, no frank heme Flat,  Good foveal reflex, fine drusen, No heme or edema   Vessels attenuated, mild tortuosity attenuated, mild tortuosity   Periphery Attached, No heme Attached, No heme           IMAGING AND PROCEDURES  Imaging and Procedures for 10/16/2023  OCT, Retina - OU - Both Eyes       Right Eye Quality was good. Central Foveal Thickness: 248. Progression has improved. Findings include normal foveal contour, no IRF, retinal drusen , subretinal hyper-reflective material, pigment epithelial detachment, subretinal fluid (Focal PED with shallow SRF ST to fovea -- slightly improved , partial PVD ).   Left Eye Quality was good. Central Foveal Thickness: 246. Progression has been stable. Findings include normal foveal contour, no IRF, no SRF, retinal drusen .   Notes *Images captured and stored on drive  Diagnosis / Impression:  OD: Focal PED/CNV with shallow SRF ST to fovea -- slightly improved, partial PVD OS: NFP, no IRF/SRF  Clinical management:  See below  Abbreviations: NFP - Normal foveal profile. CME - cystoid macular edema. PED - pigment epithelial detachment. IRF - intraretinal fluid. SRF - subretinal fluid. EZ - ellipsoid zone. ERM - epiretinal membrane. ORA - outer retinal atrophy. ORT - outer retinal tubulation. SRHM - subretinal hyper-reflective material. IRHM - intraretinal hyper-reflective material      Intravitreal Injection, Pharmacologic Agent - OD - Right Eye       Time Out 10/16/2023. 9:16 AM. Confirmed correct patient, procedure, site, and patient consented.   Anesthesia Topical anesthesia was used. Anesthetic medications included Lidocaine 2%, Proparacaine 0.5%.   Procedure Preparation included 5% betadine to ocular surface, eyelid speculum. A supplied needle was used.   Injection: 1.25 mg Bevacizumab 1.25mg /0.96ml   Route:  Intravitreal, Site: Right Eye   NDC: [id number], Lot: [id number], Expiration date: 11/20/2023   Post-op Post injection exam found visual acuity  of at least counting fingers. The patient tolerated the procedure well. There were no complications. The patient received written and verbal post procedure care education.           ASSESSMENT/PLAN:   ICD-10-CM   1. Exudative age-related macular degeneration of right eye with active choroidal neovascularization (HCC)  H35.3211 OCT, Retina - OU - Both Eyes    Intravitreal Injection, Pharmacologic Agent - OD - Right Eye    Bevacizumab (AVASTIN) SOLN 1.25 mg    2. Intermediate stage nonexudative age-related macular degeneration of left eye  H35.3122     3. Diabetes mellitus type 2 without retinopathy (HCC)  E11.9     4. Essential hypertension  I10     5. Hypertensive retinopathy of both eyes  H35.033     6. Combined forms of age-related cataract of both eyes  H25.813      Exudative age related macular degeneration OD  - s/p IVA OD #1 (01.14.25)  - FA (01.14.24) shows foval CNV with leakage superior fovea  - OCT shows focal PED/CNV with shallow SRF ST to fovea -- slightly improved  - BCVA OD 20/25 -- stable  - recommend IVA OD #2 today, 02.11.25  - pt wishes to be treated with IVA  - RBA of procedure discussed, questions answered - IVA informed consent obtained and signed, 01.14.25 (OD) - see procedure note - f/u in 4 wks, DFE, OCT, possible injxn  2. Age related macular degeneration, non-exudative, OS  - The incidence, anatomy, and pathology of dry AMD, risk of progression, and the AREDS and AREDS 2 study including smoking risks discussed with patient.  - Recommend amsler grid monitoring  - monitor  3. Diabetes mellitus, type 2 without retinopathy  - A1c: 6.4 on 09.27.24 - The incidence, risk factors for progression, natural history and treatment options for diabetic retinopathy  were discussed with patient.   - The need for close monitoring of blood glucose, blood pressure, and serum lipids, avoiding cigarette or any type of tobacco, and the need for long term follow up was  also discussed with patient. - f/u in 1 year, sooner prn  4,5. Hypertensive retinopathy OU - discussed importance of tight BP control - monitor  6. Mixed Cataract OU - The symptoms of cataract, surgical options, and treatments and risks were discussed with patient. - discussed diagnosis and progression - monitor   Ophthalmic Meds Ordered this visit:  Meds ordered this encounter  Medications   Bevacizumab (AVASTIN) SOLN 1.25 mg     Return in about 4 weeks (around 11/13/2023) for f/u exu ARMD OD, DFE, OCT.  There are no Patient Instructions on file for this visit.  Explained the diagnoses, plan, and follow up with the patient and they expressed understanding.  Patient expressed understanding of the importance of proper follow up care.   This document serves as a record of services personally performed by Karie Chimera, MD, PhD. It was created on their behalf by Charlette Caffey, COT an ophthalmic technician. The creation of this record is the provider's dictation and/or activities during the visit.    Electronically signed by:  Charlette Caffey, COT  10/16/23 12:28 PM  This document serves as a record of services personally performed by Karie Chimera, MD, PhD. It was created on their behalf by Glee Arvin. Manson Passey, OA an ophthalmic technician.  The creation of this record is the provider's dictation and/or activities during the visit.    Electronically signed by: Glee Arvin. Manson Passey, OA 10/16/23 12:28 PM  Karie Chimera, M.D., Ph.D. Diseases & Surgery of the Retina and Vitreous [facility] 10/16/2023  I have reviewed the above documentation for accuracy and completeness, and I agree with the above. Karie Chimera, M.D., Ph.D. 10/16/23 12:29 PM   Abbreviations: M myopia (nearsighted); A astigmatism; H hyperopia (farsighted); P presbyopia; Mrx spectacle prescription;  CTL contact lenses; OD right eye; OS left eye; OU both eyes  XT exotropia; ET esotropia;  PEK punctate epithelial keratitis; PEE punctate epithelial erosions; DES dry eye syndrome; MGD meibomian gland dysfunction; ATs artificial tears; PFAT's preservative free artificial tears; NSC nuclear sclerotic cataract; PSC posterior subcapsular cataract; ERM epi-retinal membrane; PVD posterior vitreous detachment; RD retinal detachment; DM diabetes mellitus; DR diabetic retinopathy; NPDR non-proliferative diabetic retinopathy; PDR proliferative diabetic retinopathy; CSME clinically significant macular edema; DME diabetic macular edema; dbh dot blot hemorrhages; CWS cotton wool spot; POAG primary open angle glaucoma; C/D cup-to-disc ratio; HVF humphrey visual field; GVF goldmann visual field; OCT optical coherence tomography; IOP intraocular pressure; BRVO Branch retinal vein occlusion; CRVO central retinal vein occlusion; CRAO central retinal artery occlusion; BRAO branch retinal artery occlusion; RT retinal tear; SB scleral buckle; PPV pars plana vitrectomy; VH Vitreous hemorrhage; PRP panretinal laser photocoagulation; IVK intravitreal kenalog; VMT vitreomacular traction; MH Macular hole;  NVD neovascularization of the disc; NVE neovascularization elsewhere; AREDS age related eye disease study; ARMD age related macular degeneration; POAG primary open angle glaucoma; EBMD epithelial/anterior basement membrane dystrophy; ACIOL anterior chamber intraocular lens; IOL intraocular lens; PCIOL posterior chamber intraocular lens; Phaco/IOL phacoemulsification with intraocular lens placement; PRK photorefractive keratectomy; LASIK laser assisted in situ keratomileusis; HTN hypertension; DM diabetes mellitus; COPD chronic obstructive pulmonary disease

## 2023-10-16 ENCOUNTER — Encounter (INDEPENDENT_AMBULATORY_CARE_PROVIDER_SITE_OTHER): Payer: Self-pay | Admitting: Ophthalmology

## 2023-10-16 ENCOUNTER — Ambulatory Visit (INDEPENDENT_AMBULATORY_CARE_PROVIDER_SITE_OTHER): Payer: 59 | Admitting: Ophthalmology

## 2023-10-16 DIAGNOSIS — E119 Type 2 diabetes mellitus without complications: Secondary | ICD-10-CM

## 2023-10-16 DIAGNOSIS — H353211 Exudative age-related macular degeneration, right eye, with active choroidal neovascularization: Secondary | ICD-10-CM

## 2023-10-16 DIAGNOSIS — H25813 Combined forms of age-related cataract, bilateral: Secondary | ICD-10-CM | POA: Diagnosis not present

## 2023-10-16 DIAGNOSIS — H353122 Nonexudative age-related macular degeneration, left eye, intermediate dry stage: Secondary | ICD-10-CM | POA: Diagnosis not present

## 2023-10-16 DIAGNOSIS — H35033 Hypertensive retinopathy, bilateral: Secondary | ICD-10-CM

## 2023-10-16 DIAGNOSIS — I10 Essential (primary) hypertension: Secondary | ICD-10-CM

## 2023-10-16 MED ORDER — BEVACIZUMAB CHEMO INJECTION 1.25MG/0.05ML SYRINGE FOR KALEIDOSCOPE
1.2500 mg | INTRAVITREAL | Status: AC | PRN
  Administered 2023-10-16: 1.25 mg via INTRAVITREAL

## 2023-10-19 ENCOUNTER — Ambulatory Visit (INDEPENDENT_AMBULATORY_CARE_PROVIDER_SITE_OTHER): Payer: Medicare Other

## 2023-10-19 DIAGNOSIS — I442 Atrioventricular block, complete: Secondary | ICD-10-CM

## 2023-10-19 LAB — CUP PACEART REMOTE DEVICE CHECK
Battery Remaining Longevity: 91 mo
Battery Voltage: 2.98 V
Brady Statistic AP VP Percent: 35.53 %
Brady Statistic AP VS Percent: 1.98 %
Brady Statistic AS VP Percent: 52.98 %
Brady Statistic AS VS Percent: 9.51 %
Brady Statistic RA Percent Paced: 37.51 %
Brady Statistic RV Percent Paced: 88.51 %
Date Time Interrogation Session: 20250214001614
Implantable Lead Connection Status: 753985
Implantable Lead Connection Status: 753985
Implantable Lead Implant Date: 20210711
Implantable Lead Implant Date: 20210711
Implantable Lead Location: 753859
Implantable Lead Location: 753860
Implantable Lead Model: 5076
Implantable Lead Model: 5076
Implantable Pulse Generator Implant Date: 20210711
Lead Channel Impedance Value: 361 Ohm
Lead Channel Impedance Value: 361 Ohm
Lead Channel Impedance Value: 399 Ohm
Lead Channel Impedance Value: 532 Ohm
Lead Channel Pacing Threshold Amplitude: 0.625 V
Lead Channel Pacing Threshold Amplitude: 0.75 V
Lead Channel Pacing Threshold Pulse Width: 0.4 ms
Lead Channel Pacing Threshold Pulse Width: 0.4 ms
Lead Channel Sensing Intrinsic Amplitude: 3.75 mV
Lead Channel Sensing Intrinsic Amplitude: 3.75 mV
Lead Channel Sensing Intrinsic Amplitude: 4.375 mV
Lead Channel Sensing Intrinsic Amplitude: 4.375 mV
Lead Channel Setting Pacing Amplitude: 1.5 V
Lead Channel Setting Pacing Amplitude: 2.5 V
Lead Channel Setting Pacing Pulse Width: 0.4 ms
Lead Channel Setting Sensing Sensitivity: 1.2 mV
Zone Setting Status: 755011
Zone Setting Status: 755011

## 2023-10-30 NOTE — Progress Notes (Signed)
 [facility] - Clinic Note  11/13/2023   CHIEF COMPLAINT Patient presents for Retina Follow Up  HISTORY OF PRESENT ILLNESS: Tiffany Berg is a 68 y.o. female who presents to the clinic today for:  HPI     Retina Follow Up   Patient presents with  Wet AMD.  In right eye.  This started 4 weeks ago.  Duration of 4 weeks.  Since onset it is stable.  I, the attending physician,  performed the HPI with the patient and updated documentation appropriately.        Comments   4 week retina follow up AMD OD and IVA OD pt is reporting no vision changes noticed she denies any flashes or floaters having some itching using AT's as needed      Last edited by Rennis Chris, MD on 11/13/2023  5:24 PM.    Pt states she has had no vision changes since last visit.  Referring physician: Diona Foley, MD [address] [place],  [place] [address]  HISTORICAL INFORMATION:  Selected notes from the MEDICAL RECORD NUMBER Referred by Dr. Zenaida Niece for ARMD eval LEE:  Ocular Hx- PMH-   CURRENT MEDICATIONS: No current outpatient medications on file. (Ophthalmic Drugs)   No current facility-administered medications for this visit. (Ophthalmic Drugs)   Current Outpatient Medications (Other)  Medication Sig   aspirin EC 81 MG tablet Take 81 mg by mouth daily. Swallow whole.   B Complex Vitamins (VITAMIN B COMPLEX PO) Take 1 tablet by mouth daily.   benzonatate (TESSALON) 100 MG capsule Take 1 capsule (100 mg total) by mouth 2 (two) times daily as needed for up to 10 days.   Cyanocobalamin (VITAMIN B 12) 100 MCG LOZG Take by mouth.   doxylamine, Sleep, (UNISOM) 25 MG tablet Take 25 mg by mouth at bedtime as needed. As need   fluticasone (FLONASE) 50 MCG/ACT nasal spray Place 1 spray into both nostrils 2 (two) times daily.   levothyroxine (SYNTHROID) 75 MCG tablet Take 1 tablet (75 mcg total) by mouth daily before breakfast.   Omega 3 1000 MG CAPS Take 1,000 mg by mouth daily.    omeprazole (PRILOSEC) 40 MG capsule Take 1 capsule (40 mg total) by mouth daily.   OVER THE COUNTER MEDICATION Vitamin D 3 1000 mg   rosuvastatin (CRESTOR) 10 MG tablet Take 1 tablet (10 mg total) by mouth daily.   No current facility-administered medications for this visit. (Other)   REVIEW OF SYSTEMS: ROS   Positive for: Gastrointestinal, Endocrine, Cardiovascular, Eyes Negative for: Constitutional, Neurological, Skin, Genitourinary, Musculoskeletal, HENT, Respiratory, Psychiatric, Allergic/Imm, Heme/Lymph Last edited by Annalee Genta D, COT on 11/13/2023  2:35 PM.       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   w/ symptomatic bradycardia, rate 30s;  s/p  PPM 03-14-2020   Myalgia, multiple sites    Pre-diabetes    Symptomatic bradycardia 03/12/2020   s/p PPM   Wears glasses    Wears partial dentures    upper and lower   Past Surgical History:  Procedure Laterality Date   Quintella Reichert OSTEOTOMY Right 10/15/2020   Procedure: Quintella Reichert OSTEOTOMY;  Surgeon: Edwin Cap, DPM;  Location: [facility];  Service: Podiatry;  Laterality: Right;  REGIONAL BLOCK   AIKEN OSTEOTOMY Left 02/24/2022   Procedure: Quintella Reichert OSTEOTOMY;  Surgeon: Edwin Cap, DPM;  Location: [facility];  Service: Podiatry;  Laterality: Left;   ANTERIOR AND POSTERIOR REPAIR N/A 02/18/2015   Procedure: ANTERIOR (CYSTOCELE) AND POSTERIOR REPAIR (RECTOCELE);  Surgeon: Osborn Coho, MD;  Location: [facility];  Service: Gynecology;  Laterality: N/A;   BLADDER SUSPENSION N/A 02/18/2015   Procedure: TRANSVAGINAL TAPE (TVT) PROCEDURE;  Surgeon: Osborn Coho, MD;  Location: [facility];  Service: Gynecology;  Laterality: N/A;   COLONOSCOPY     pt states she might have had one many years ago   CYSTOSCOPY Bilateral 02/18/2015   Procedure: CYSTOSCOPY;  Surgeon: Osborn Coho, MD;  Location: [facility];  Service: Gynecology;  Laterality: Bilateral;   GRAFT APPLICATION Left 02/24/2022   Procedure: BONE GRAFT;  Surgeon: Edwin Cap, DPM;  Location: [facility] [place];  Service: Podiatry;  Laterality: Left;   HALLUX VALGUS LAPIDUS Right 10/15/2020   Procedure: HALLUX VALGUS LAPIDUS AND BONE GRAFT;  Surgeon: Edwin Cap, DPM;  Location: [facility];  Service: Podiatry;  Laterality: Right;  WITH IV SEDATION; REGIONAL BLOCK   HALLUX VALGUS LAPIDUS Left 02/24/2022   Procedure: HALLUX VALGUS LAPIDUS;  Surgeon: Edwin Cap, DPM;  Location: [facility];  Service: Podiatry;  Laterality: Left;   HAMMER TOE SURGERY Right 10/15/2020   Procedure: HAMMER TOE CORRECTION;  Surgeon: Edwin Cap, DPM;  Location: [facility];  Service: Podiatry;  Laterality: Right;  REGIONAL BLOCK   HAMMER TOE SURGERY Left 02/24/2022   Procedure: HAMMER TOE CORRECTION SECOND TOE LEFT FOOT;  Surgeon: Edwin Cap, DPM;  Location: [facility];  Service: Podiatry;  Laterality: Left;   LAPAROSCOPIC ASSISTED VAGINAL HYSTERECTOMY N/A 02/18/2015   Procedure: LAPAROSCOPIC ASSISTED VAGINAL HYSTERECTOMY;  Surgeon: Osborn Coho, MD;  Location: [facility];  Service: Gynecology;  Laterality: N/A;   MASTECTOMY, PARTIAL Left 08-12-2013  @[facility]    excision another site    PACEMAKER IMPLANT N/A 03/14/2020   Procedure: PACEMAKER IMPLANT;  Surgeon: Regan Lemming, MD;  Location: [facility];  Service: Cardiovascular;  Laterality: N/A;   RADIOACTIVE SEED GUIDED PARTIAL MASTECTOMY/AXILLARY SENTINEL NODE BIOPSY/AXILLARY NODE DISSECTION Left 07-22-2013  @[facility]    SALPINGOOPHORECTOMY Bilateral 02/18/2015   Procedure: BILATERAL SALPINGO OOPHORECTOMY;  Surgeon: Osborn Coho, MD;  Location: [facility];  Service: Gynecology;  Laterality: Bilateral;   FAMILY HISTORY Family History  Problem Relation Age of Onset   Heart disease Mother    Colon cancer Father    Cancer Father        prostata   Stomach cancer Sister    Cancer Sister 7       Breast   Esophageal cancer Neg Hx    Rectal cancer Neg Hx    SOCIAL HISTORY Social History   Tobacco Use   Smoking status: Never   Smokeless tobacco: Never  Vaping Use   Vaping  status: Never Used  Substance Use Topics   Alcohol use: Yes    Comment: socially   Drug use: Never       OPHTHALMIC EXAM:  Base Eye Exam     Visual Acuity (Snellen - Linear)       Right Left   Dist [place] 20/25 -1 20/60 -2   Dist ph [place] NI 20/30         Tonometry (Tonopen, 2:08 PM)       Right Left   Pressure 13 16         Pupils       Pupils Dark Light Shape React APD   Right PERRL 3 2 Round Brisk None   Left PERRL 3 2 Round Brisk None         Visual Fields       Left Right    Full Full         Extraocular Movement       Right Left    Full, Ortho Full, Ortho         Neuro/Psych     Oriented x3: Yes   Mood/Affect: Normal         Dilation     Both eyes: 2.5% Phenylephrine @ 2:08 PM           Slit Lamp and Fundus Exam     Slit Lamp Exam       Right Left   Lids/Lashes Dermatochalasis - upper lid Dermatochalasis - upper lid   Conjunctiva/Sclera mild nasal pterygium mild nasal pterygium   Cornea nasal pterygium, trace tear film debris nasal pterygium, trace tear film debris, 1+ PEE    Anterior Chamber deep and clear deep and clear   Iris Round and dilated, No NVI Round and dilated, No NVI   Lens 2+ Nuclear sclerosis, 2-3+ Cortical cataract 2+ Nuclear sclerosis, 2+ Cortical cataract   Anterior Vitreous mild syneresis mild syneresis         Fundus Exam       Right Left   Disc Pink and Sharp, Compact Pink and Sharp, Compact   C/D Ratio 0.4 0.4   Macula Flat, Blunted foveal reflex, focal CNV with shallow SRF and edema ST macula -- improved, mild drusen, no frank heme Flat, Good foveal reflex, fine drusen, No heme or edema   Vessels attenuated, mild tortuosity attenuated, mild tortuosity   Periphery Attached, No heme Attached, No heme           IMAGING AND PROCEDURES  Imaging and Procedures for 11/13/2023  OCT, Retina - OU - Both Eyes       Right Eye Quality was good. Central Foveal Thickness: 248. Progression has improved. Findings include normal foveal contour, no IRF, no SRF, retinal drusen , subretinal hyper-reflective material, pigment epithelial detachment (Focal PED with shallow SRF ST to fovea -- improved, partial PVD ).   Left Eye Quality was good. Central Foveal Thickness: 249. Progression has been stable. Findings include normal foveal contour, no IRF, no SRF, retinal drusen .   Notes *Images captured and stored on drive  Diagnosis / Impression:  OD: Focal PED/CNV with shallow SRF ST to fovea -- improved, partial PVD OS: NFP, no IRF/SRF  Clinical management:  See below  Abbreviations: NFP - Normal foveal profile. CME - cystoid macular edema. PED - pigment epithelial detachment. IRF - intraretinal fluid. SRF - subretinal fluid. EZ - ellipsoid zone. ERM - epiretinal membrane. ORA - outer retinal atrophy. ORT - outer retinal  tubulation. SRHM - subretinal hyper-reflective material. IRHM - intraretinal hyper-reflective material      Intravitreal Injection, Pharmacologic Agent - OD - Right Eye       Time Out 11/13/2023. 2:35 PM. Confirmed correct  patient, procedure, site, and patient consented.   Anesthesia Topical anesthesia was used. Anesthetic medications included Lidocaine 2%, Proparacaine 0.5%.   Procedure Preparation included 5% betadine to ocular surface, eyelid speculum. A supplied needle was used.   Injection: 1.25 mg Bevacizumab 1.25mg /0.56ml   Route: Intravitreal, Site: Right Eye   NDC: [id number], Lot: [id number] , Expiration date: 12/10/2023   Post-op Post injection exam found visual acuity of at least counting fingers. The patient tolerated the procedure well. There were no complications. The patient received written and verbal post procedure care education.            ASSESSMENT/PLAN:   ICD-10-CM   1. Exudative age-related macular degeneration of right eye with active choroidal neovascularization (HCC)  H35.3211 OCT, Retina - OU - Both Eyes    Intravitreal Injection, Pharmacologic Agent - OD - Right Eye    Bevacizumab (AVASTIN) SOLN 1.25 mg    2. Intermediate stage nonexudative age-related macular degeneration of left eye  H35.3122     3. Diabetes mellitus type 2 without retinopathy (HCC)  E11.9     4. Essential hypertension  I10     5. Hypertensive retinopathy of both eyes  H35.033      Exudative age related macular degeneration OD  - s/p IVA OD #1 (01.14.25), #2 (02.11.25)  - FA (01.14.24) shows foval CNV with leakage superior fovea  - OCT shows focal PED/CNV with shallow SRF ST to fovea -- improved  - BCVA OD 20/25 -- stable  - recommend IVA OD #3 today, 03.11.25 w/ f/u ext to 5 wks  - pt wishes to be treated with IVA  - RBA of procedure discussed, questions answered - IVA informed consent obtained and signed, 01.14.25 (OD) - see procedure note - f/u in 5 wks, DFE, OCT, possible injxn  2. Age related macular degeneration, non-exudative, OS  - The incidence, anatomy, and pathology of dry AMD, risk of progression, and the AREDS and AREDS 2 study including smoking risks discussed with  patient.  - Recommend amsler grid monitoring  - monitor  3. Diabetes mellitus, type 2 without retinopathy  - A1c: 6.4 on 09.27.24 - The incidence, risk factors for progression, natural history and treatment options for diabetic retinopathy  were discussed with patient.   - The need for close monitoring of blood glucose, blood pressure, and serum lipids, avoiding cigarette or any type of tobacco, and the need for long term follow up was also discussed with patient. - f/u in 1 year, sooner prn  4,5. Hypertensive retinopathy OU - discussed importance of tight BP control - monitor  6. Mixed Cataract OU - The symptoms of cataract, surgical options, and treatments and risks were discussed with patient. - discussed diagnosis and progression - monitor   Ophthalmic Meds Ordered this visit:  Meds ordered this encounter  Medications   Bevacizumab (AVASTIN) SOLN 1.25 mg     Return in about 5 weeks (around 12/18/2023) for ex ARMD - DFE, OCT, Possible Injxn.  There are no Patient Instructions on file for this visit.  Explained the diagnoses, plan, and follow up with the patient and they expressed understanding.  Patient expressed understanding of the importance of proper follow up care.   This document serves as a record of services personally performed by  Karie Chimera, MD, PhD. It was created on their behalf by Charlette Caffey, COT an ophthalmic technician. The creation of this record is the provider's dictation and/or activities during the visit.    Electronically signed by:  Charlette Caffey, COT  11/13/23 5:26 PM  This document serves as a record of services personally performed by Karie Chimera, MD, PhD. It was created on their behalf by Annalee Genta, COMT. The creation of this record is the provider's dictation and/or activities during the visit.  Electronically signed by: Annalee Genta, COMT 11/13/23 5:26 PM  Karie Chimera, M.D., Ph.D. Diseases & Surgery of the Retina  and Vitreous [facility] 11/13/2023  I have reviewed the above documentation for accuracy and completeness, and I agree with the above. Karie Chimera, M.D., Ph.D. 11/13/23 5:27 PM   Abbreviations: M myopia (nearsighted); A astigmatism; H hyperopia (farsighted); P presbyopia; Mrx spectacle prescription;  CTL contact lenses; OD right eye; OS left eye; OU both eyes  XT exotropia; ET esotropia; PEK punctate epithelial keratitis; PEE punctate epithelial erosions; DES dry eye syndrome; MGD meibomian gland dysfunction; ATs artificial tears; PFAT's preservative free artificial tears; NSC nuclear sclerotic cataract; PSC posterior subcapsular cataract; ERM epi-retinal membrane; PVD posterior vitreous detachment; RD retinal detachment; DM diabetes mellitus; DR diabetic retinopathy; NPDR non-proliferative diabetic retinopathy; PDR proliferative diabetic retinopathy; CSME clinically significant macular edema; DME diabetic macular edema; dbh dot blot hemorrhages; CWS cotton wool spot; POAG primary open angle glaucoma; C/D cup-to-disc ratio; HVF humphrey visual field; GVF goldmann visual field; OCT optical coherence tomography; IOP intraocular pressure; BRVO Branch retinal vein occlusion; CRVO central retinal vein occlusion; CRAO central retinal artery occlusion; BRAO branch retinal artery occlusion; RT retinal tear; SB scleral buckle; PPV pars plana vitrectomy; VH Vitreous hemorrhage; PRP panretinal laser photocoagulation; IVK intravitreal kenalog; VMT vitreomacular traction; MH Macular hole;  NVD neovascularization of the disc; NVE neovascularization elsewhere; AREDS age related eye disease study; ARMD age related macular degeneration; POAG primary open angle glaucoma; EBMD epithelial/anterior basement membrane dystrophy; ACIOL anterior chamber intraocular lens; IOL intraocular lens; PCIOL posterior chamber intraocular lens; Phaco/IOL phacoemulsification with intraocular lens placement; PRK  photorefractive keratectomy; LASIK laser assisted in situ keratomileusis; HTN hypertension; DM diabetes mellitus; COPD chronic obstructive pulmonary disease

## 2023-11-05 ENCOUNTER — Ambulatory Visit: Payer: Self-pay | Admitting: Family Medicine

## 2023-11-05 NOTE — Telephone Encounter (Signed)
 Chief Complaint: Headache, Chest pain Symptoms: Chest pain, headache, sore throat Frequency: 1 week Pertinent Negatives: Patient denies fever Disposition: [x] ED /[] Urgent Care (no appt availability in office) / [] Appointment(In office/virtual)/ []  [facility]/ [] Home Care/ [] Refused Recommended Disposition /[] [facility]/ []  Follow-up with PCP Additional Notes: Patient's daughter called along with patient on the phone to initially report sore throat and headache, but patient then reported having chest pain/pressure in the center of her chest for 1 week, constant, and going through to her back. Pain is radiating to right arm as well. Patient states she also feels like she is having difficulty breathing. Patient referred to ED for evaluation asap. Daughter stated she would drive patient to ED now. Advised to follow up with PCP after ED evaluation.   Reason for Disposition  [1] Chest pain lasts > 5 minutes AND [2] occurred in past 3 days (72 hours) (Exception: Feels exactly the same as previously diagnosed heartburn and has accompanying sour taste in mouth.)  Answer Assessment - Initial Assessment Questions 1. ONSET: "When did the throat start hurting?" (Hours or days ago)      A few days ago 2. SEVERITY: "How bad is the sore throat?" (Scale 1-10; mild, moderate or severe)   - MILD (1-3):  Doesn't interfere with eating or normal activities.   - MODERATE (4-7): Interferes with eating some solids and normal activities.   - SEVERE (8-10):  Excruciating pain, interferes with most normal activities.   - SEVERE WITH DYSPHAGIA (10): Can't swallow liquids, drooling.     Hurts to eat and drink water 3. STREP EXPOSURE: "Has there been any exposure to strep within the past week?" If Yes, ask: "What type of contact occurred?"      No 4.  VIRAL SYMPTOMS: "Are there any symptoms of a cold, such as a runny nose, cough, hoarse voice or red eyes?"      Coughing, headache  5. FEVER: "Do  you have a fever?" If Yes, ask: "What is your temperature, how was it measured, and when did it start?"     No 6. PUS ON THE TONSILS: "Is there pus on the tonsils in the back of your throat?"     No 7. OTHER SYMPTOMS: "Do you have any other symptoms?" (e.g., difficulty breathing, headache, rash)     Headache, chest pain, trouble breathing  Answer Assessment - Initial Assessment Questions 1. LOCATION: "Where does it hurt?"       Center 2. RADIATION: "Does the pain go anywhere else?" (e.g., into neck, jaw, arms, back)     Slightly to right arm, and through to back  3. ONSET: "When did the chest pain begin?" (Minutes, hours or days)      A week ago 4. PATTERN: "Does the pain come and go, or has it been constant since it started?"  "Does it get worse with exertion?"      Constant  5. DURATION: "How long does it last" (e.g., seconds, minutes, hours)     Constant  6. SEVERITY: "How bad is the pain?"  (e.g., Scale 1-10; mild, moderate, or severe)    - MILD (1-3): doesn't interfere with normal activities     - MODERATE (4-7): interferes with normal activities or awakens from sleep    - SEVERE (8-10): excruciating pain, unable to do any normal activities       7 7. CARDIAC RISK FACTORS: "Do you have any history of heart problems or risk factors for heart disease?" (  e.g., angina, prior heart attack; diabetes, high blood pressure, high cholesterol, smoker, or strong family history of heart disease)     Pacemaker  8. PULMONARY RISK FACTORS: "Do you have any history of lung disease?"  (e.g., blood clots in lung, asthma, emphysema, birth control pills)     No 9. CAUSE: "What do you think is causing the chest pain?"     Unsure  10. OTHER SYMPTOMS: "Do you have any other symptoms?" (e.g., dizziness, nausea, vomiting, sweating, fever, difficulty breathing, cough)       Headache, difficulty breathing  Protocols used: Sore Throat-A-AH, Chest Pain-A-AH

## 2023-11-06 NOTE — Telephone Encounter (Signed)
 I called and spoke with pt's daughter. Pt didn't end up going to the ED since she didn't want to have to wait all night. Appt made for tomorrow at 3:00pm since pt is still feeling the same - this has been ongoing x a week.

## 2023-11-07 ENCOUNTER — Ambulatory Visit (INDEPENDENT_AMBULATORY_CARE_PROVIDER_SITE_OTHER): Admitting: Family Medicine

## 2023-11-07 ENCOUNTER — Encounter: Payer: Self-pay | Admitting: Family Medicine

## 2023-11-07 ENCOUNTER — Ambulatory Visit

## 2023-11-07 VITALS — BP 128/80 | HR 82 | Temp 98.0°F | Resp 16 | Ht 63.0 in | Wt 156.4 lb

## 2023-11-07 DIAGNOSIS — J069 Acute upper respiratory infection, unspecified: Secondary | ICD-10-CM

## 2023-11-07 DIAGNOSIS — R0602 Shortness of breath: Secondary | ICD-10-CM

## 2023-11-07 DIAGNOSIS — R0789 Other chest pain: Secondary | ICD-10-CM | POA: Diagnosis not present

## 2023-11-07 DIAGNOSIS — R051 Acute cough: Secondary | ICD-10-CM

## 2023-11-07 DIAGNOSIS — I700 Atherosclerosis of aorta: Secondary | ICD-10-CM | POA: Diagnosis not present

## 2023-11-07 DIAGNOSIS — K219 Gastro-esophageal reflux disease without esophagitis: Secondary | ICD-10-CM

## 2023-11-07 DIAGNOSIS — R0989 Other specified symptoms and signs involving the circulatory and respiratory systems: Secondary | ICD-10-CM | POA: Diagnosis not present

## 2023-11-07 MED ORDER — BENZONATATE 100 MG PO CAPS: 100.0000 mg | ORAL_CAPSULE | Freq: Two times a day (BID) | ORAL | 0 refills | Status: AC | PRN

## 2023-11-07 MED ORDER — FLUTICASONE PROPIONATE 50 MCG/ACT NA SUSP: 1.0000 | Freq: Two times a day (BID) | NASAL | 0 refills | Status: AC

## 2023-11-07 MED ORDER — OMEPRAZOLE 40 MG PO CPDR: 40.0000 mg | DELAYED_RELEASE_CAPSULE | Freq: Every day | ORAL | 0 refills | Status: DC

## 2023-11-07 NOTE — Patient Instructions (Addendum)
 A few things to remember from today's visit:  Chest discomfort - Plan: Basic metabolic panel  Acute cough - Plan: CBC, C-reactive protein, DG Chest 2 View, benzonatate (TESSALON) 100 MG capsule  Shortness of breath - Plan: CBC, Basic metabolic panel  Gastroesophageal reflux disease, unspecified whether esophagitis present  URI, acute  Flonase diario por 10 14 dias. Agua saline para irrigarse la Clinical cytogeneticist. Acetaminophen para el dolor de [place].  If you need refills for medications you take chronically, please call your pharmacy. Do not use My Chart to request refills or for acute issues that need immediate attention. If you send a my chart message, it may take a few days to be addressed, specially if I am not in the office.  Please be sure medication list is accurate. If a new problem present, please set up appointment sooner than planned today.

## 2023-11-07 NOTE — Progress Notes (Unsigned)
 ACUTE VISIT Chief Complaint  Patient presents with   Follow-up    report sore throat and headache, but patient then reported having chest pain/pressure in the center of her chest for 1 week, constant, and going through to her back. Pain is radiating to right arm as well.   HPI: Ms.Manha Oguin is a 68 y.o. female with a PMHx significant for CHB s/p pacemaker placement, OA, HLD, prediabetes, and hypothyroidism, who is here today complaining of URI symptoms and chest pain.   She complains of frontal headache, sore throat, nasal congestion, rhinorrhea, productive cough, and mild chills for about a week. She also says she has felt febrile but has not actually checked her temperature.   Cough This is a new problem. The current episode started in the past 7 days. The problem has been unchanged. The problem occurs every few hours. The cough is Productive of sputum. Associated symptoms include heartburn and myalgias. Pertinent negatives include no ear congestion, ear pain, eye redness, hemoptysis or rash. Nothing aggravates the symptoms. There is no history of asthma, COPD or environmental allergies.  Requesting something stronger for headache. In the past Tramadol has been prescribed for headache and did not tolerate well. No associated visual changes,or vomiting. Mild nausea. Cough is interfering with sleep.  She has had some throat pain when swallowing solid foods, but not liquids.  She has taken tylenol and ibuprofen. She has used a nasal spray before but not recently.  Has not tested for Covid or Flu.  Pertinent negatives include wheezing and no recent sick contacts.   Patient also complains of a heaviness sensation in her lower retrosternal area of chest, some SOB, states that she does not feel like she is taking enough air, and thoracic back pain for 15 days. Also endorses some acid reflux.  She has an appointment scheduled with cardiology on 3/18.  Denies orthopnea, PND,or edema.    Review of Systems  Constitutional:  Positive for activity change, appetite change and fatigue. Negative for diaphoresis.  HENT:  Negative for ear pain, facial swelling and trouble swallowing.   Eyes:  Negative for discharge and redness.  Respiratory:  Positive for cough. Negative for hemoptysis and stridor.   Cardiovascular:  Negative for palpitations.  Gastrointestinal:  Positive for heartburn. Negative for abdominal pain.  Genitourinary:  Negative for decreased urine volume, dysuria and hematuria.  Musculoskeletal:  Positive for myalgias.  Skin:  Negative for rash.  Allergic/Immunologic: Negative for environmental allergies.  Neurological:  Negative for syncope, facial asymmetry and weakness.  Psychiatric/Behavioral:  Negative for confusion. The patient is nervous/anxious.   See other pertinent positives and negatives in HPI.  Current Outpatient Medications on File Prior to Visit  Medication Sig Dispense Refill   aspirin EC 81 MG tablet Take 81 mg by mouth daily. Swallow whole.     B Complex Vitamins (VITAMIN B COMPLEX PO) Take 1 tablet by mouth daily.     Cyanocobalamin (VITAMIN B 12) 100 MCG LOZG Take by mouth.     doxylamine, Sleep, (UNISOM) 25 MG tablet Take 25 mg by mouth at bedtime as needed. As need     levothyroxine (SYNTHROID) 75 MCG tablet Take 1 tablet (75 mcg total) by mouth daily before breakfast. 90 tablet 3   Omega 3 1000 MG CAPS Take 1,000 mg by mouth daily.     OVER THE COUNTER MEDICATION Vitamin D 3 1000 mg     rosuvastatin (CRESTOR) 10 MG tablet Take 1 tablet (10 mg total) by  mouth daily. 90 tablet 3   No current facility-administered medications on file prior to visit.   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   w/ symptomatic bradycardia, rate 30s;  s/p  PPM 03-14-2020   Myalgia, multiple sites    Pre-diabetes    Symptomatic bradycardia 03/12/2020   s/p PPM   Wears glasses    Wears partial dentures    upper and lower   No Known Allergies  Social History   Socioeconomic History   Marital status: Legally Separated    Spouse name: Not on file   Number of children: Not on file   Years of education: Not on file   Highest education level: Not on file  Occupational History   Not on file  Tobacco Use   Smoking status: Never   Smokeless tobacco: Never  Vaping Use   Vaping status: Never Used  Substance and Sexual Activity   Alcohol use: Yes    Comment: socially   Drug use: Never   Sexual activity: Yes    Birth control/protection: Post-menopausal, Surgical  Other Topics Concern   Not on file  Social History Narrative   Not on file   Social Drivers of Health   Financial Resource Strain: Low Risk  (02/22/2022)   Overall Financial Resource Strain (CARDIA)    Difficulty of Paying Living Expenses: Not hard at all  Food Insecurity: No Food Insecurity (02/22/2022)   Hunger Vital Sign    Worried About Running Out of Food in the Last Year: Never true    Ran Out of Food in the Last Year: Never true  Transportation Needs: No Transportation Needs (02/22/2022)   PRAPARE - Administrator, Civil Service (Medical): No    Lack of Transportation (Non-Medical): No  Physical Activity: Insufficiently Active (02/22/2022)   Exercise Vital Sign    Days of Exercise per Week: 2 days    Minutes of  Exercise per Session: 60 min  Stress: No Stress Concern Present (02/22/2022)   [employer] of Occupational Health - Occupational Stress Questionnaire    Feeling of Stress : Not at all  Social Connections: Unknown (02/22/2022)   Social Connection and Isolation Panel [NHANES]    Frequency of Communication with Friends and Family: More than three times a week    Frequency of Social Gatherings with Friends and Family: More than three times a week    Attends Religious Services: Not on file    Active Member of Clubs or Organizations: Yes    Attends Banker Meetings: More than 4 times per year    Marital Status: Separated   Vitals:   11/07/23 1502  BP: 128/80  Pulse: 82  Resp: 16  Temp: 98 F (36.7 C)  SpO2: 98%   Body  mass index is 27.7 kg/m.  Physical Exam Vitals and nursing note reviewed.  Constitutional:      General: She is not in acute distress.    Appearance: She is well-developed. She is not ill-appearing.  HENT:     Head: Normocephalic and atraumatic.     Left Ear: Tympanic membrane, ear canal and external ear normal.     Ears:     Comments: Excess cerumen in the right ear, could not see TM.    Nose:     Right Sinus: No maxillary sinus tenderness.     Left Sinus: No maxillary sinus tenderness.     Comments: Mild tenderness of frontal sinuses with applying pressure.    Mouth/Throat:     Mouth: Mucous membranes are dry.     Pharynx: Oropharynx is clear. Uvula midline.  Eyes:     Conjunctiva/sclera: Conjunctivae normal.  Cardiovascular:     Rate and Rhythm: Normal rate and regular rhythm.     Heart sounds: No murmur heard. Pulmonary:     Effort: Pulmonary effort is normal. No respiratory distress.     Breath sounds: Normal breath sounds. No stridor.  Abdominal:     Palpations: Abdomen is soft. There is no hepatomegaly or mass.     Tenderness: There is no abdominal tenderness.  Lymphadenopathy:     Head:     Right side of head: No submandibular  adenopathy.     Left side of head: No submandibular adenopathy.     Cervical: No cervical adenopathy.  Skin:    General: Skin is warm.     Findings: No erythema or rash.  Neurological:     Mental Status: She is alert and oriented to person, place, and time.  Psychiatric:        Mood and Affect: Affect normal. Mood is anxious.    ASSESSMENT AND PLAN:  Ms. Tamayo was seen today for headache and chest pain.  Lab Results  Component Value Date   WBC 6.6 11/07/2023   HGB 15.3 (H) 11/07/2023   HCT 44.1 11/07/2023   MCV 91.7 11/07/2023   PLT 280.0 11/07/2023   Lab Results  Component Value Date   NA 137 11/07/2023   CL 99 11/07/2023   K 3.6 11/07/2023   CO2 29 11/07/2023   BUN 17 11/07/2023   CREATININE 0.69 11/07/2023   GFR 88.19 11/07/2023   CALCIUM 9.9 11/07/2023   ALBUMIN 4.2 06/01/2023   GLUCOSE 151 (H) 11/07/2023   Lab Results  Component Value Date   CRP <1.0 11/07/2023   Chest discomfort We discussed possible etiologies. She has an appt with cardiologist scheduled already. ? GERD. Instructed about warning signs.  -     Basic metabolic panel; Future  Shortness of breath No in respiratory distress and lung auscultation normal. Instructed about warning signs. Further recommendations according to lab results and CXR. She has an appt with her cardiologist. F/U in 2 weeks, before if needed.  -     CBC; Future -     Basic metabolic panel; Future  Acute cough Lung auscultation normal. Benzonatate and plain mucinex as well as adequate hydration recommended for symptomatic treatment. Because reporting SOB, CXR ordered.  -     CBC; Future -     C-reactive protein; Future -     DG Chest 2 View; Future -     Benzonatate; Take 1 capsule (100 mg total) by mouth 2 (two) times daily as needed for up to 10 days.  Dispense: 20 capsule; Refill: 0  Gastroesophageal reflux disease, unspecified whether esophagitis present Recommend Omeprazole 40 mg daily 30 min before  breakfast x 6-8 weeks. Avoid NSAID's. GERD precautions. F/u in 2 weeks.  -     Omeprazole; Take 1 capsule (40 mg total) by mouth daily.  Dispense: 60 capsule; Refill: 0  URI, acute Symptoms suggests a viral etiology, I explained patient that symptomatic treatment is usually recommended in this case, so I do not think abx is needed at this time. Instructed to monitor for signs of complications, clearly instructed about warning signs. Flonase nasal spray daily for 10-14 days and nasal saline irrigations.  I also explained that cough and nasal congestion can last a few days and sometimes weeks. F/U as needed.  -     Fluticasone Propionate; Place 1 spray into both nostrils 2 (two) times daily.  Dispense: 16 g; Refill: 0  Return in about 2 weeks (around 11/21/2023).  I, Rolla Etienne Wierda, acting as a scribe for Surya Folden [place], MD., have documented all relevant documentation on the behalf of Detrice Cales [place], MD, as directed by  Gene Glazebrook [place], MD while in the presence of Silvanna Ohmer [place], MD.   I, Carrianne Hyun [place], MD, have reviewed all documentation for this visit. The documentation on 11/07/23 for the exam, diagnosis, procedures, and orders are all accurate and complete.  Jensyn Cambria G. [place], MD  [facility] [place] office.

## 2023-11-08 LAB — C-REACTIVE PROTEIN: CRP: <1 mg/dL (ref 0.5–20.0)

## 2023-11-08 LAB — BASIC METABOLIC PANEL
BUN: 17 mg/dL (ref 6–23)
CO2: 29 meq/L (ref 19–32)
Calcium: 9.9 mg/dL (ref 8.4–10.5)
Chloride: 99 meq/L (ref 96–112)
Creatinine, Ser: 0.69 mg/dL (ref 0.40–1.20)
GFR: 88.19 mL/min (ref >60.00)
Glucose, Bld: 151 mg/dL — ABNORMAL HIGH (ref 70–99)
Potassium: 3.6 meq/L (ref 3.5–5.1)
Sodium: 137 meq/L (ref 135–145)

## 2023-11-08 LAB — CBC
HCT: 44.1 % (ref 36.0–46.0)
Hemoglobin: 15.3 g/dL — ABNORMAL HIGH (ref 12.0–15.0)
MCHC: 34.6 g/dL (ref 30.0–36.0)
MCV: 91.7 fl (ref 78.0–100.0)
Platelets: 280 10*3/uL (ref 150.0–400.0)
RBC: 4.81 Mil/uL (ref 3.87–5.11)
RDW: 13.9 % (ref 11.5–15.5)
WBC: 6.6 10*3/uL (ref 4.0–10.5)

## 2023-11-13 ENCOUNTER — Encounter (INDEPENDENT_AMBULATORY_CARE_PROVIDER_SITE_OTHER): Payer: Self-pay | Admitting: Ophthalmology

## 2023-11-13 ENCOUNTER — Ambulatory Visit (INDEPENDENT_AMBULATORY_CARE_PROVIDER_SITE_OTHER): Payer: 59 | Admitting: Ophthalmology

## 2023-11-13 DIAGNOSIS — H353211 Exudative age-related macular degeneration, right eye, with active choroidal neovascularization: Secondary | ICD-10-CM | POA: Diagnosis not present

## 2023-11-13 DIAGNOSIS — E119 Type 2 diabetes mellitus without complications: Secondary | ICD-10-CM

## 2023-11-13 DIAGNOSIS — H353122 Nonexudative age-related macular degeneration, left eye, intermediate dry stage: Secondary | ICD-10-CM | POA: Diagnosis not present

## 2023-11-13 DIAGNOSIS — I10 Essential (primary) hypertension: Secondary | ICD-10-CM

## 2023-11-13 DIAGNOSIS — H35033 Hypertensive retinopathy, bilateral: Secondary | ICD-10-CM | POA: Diagnosis not present

## 2023-11-13 MED ORDER — BEVACIZUMAB CHEMO INJECTION 1.25MG/0.05ML SYRINGE FOR KALEIDOSCOPE
1.2500 mg | INTRAVITREAL | Status: AC | PRN
  Administered 2023-11-13: 1.25 mg via INTRAVITREAL

## 2023-11-18 NOTE — Progress Notes (Unsigned)
 Electrophysiology Office Note:   Date:  11/20/2023  ID:  Tiffany Berg, DOB December 08, 1955, MRN [id number]  Primary Cardiologist: Donato Schultz, MD Primary Heart Failure: None Electrophysiologist: Will Jorja Loa, MD       History of Present Illness:    Spanish interpreter utilized for visit.   Tiffany Berg is a 68 y.o. 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   Since last being seen in our clinic the patient reports her job duties have recently changed. She is now having to lift more at work and notes she is getting out of breath and occasionally has chest pressure. She occasionally gets nauseated with the episodes.   She denies palpitations, PND, orthopnea, nausea, vomiting, dizziness, syncope, edema, weight gain, or early satiety.   Review of systems complete and found to be negative unless listed in HPI.   EP Information / Studies Reviewed:    EKG is ordered today. Personal review as below.  EKG Interpretation Date/Time:  Tuesday November 20 2023 08:25:26 EDT Ventricular Rate:  60 PR Interval:  234 QRS Duration:  80 QT Interval:  456 QTC Calculation: 456 R Axis:   1  Text Interpretation: AV dual-paced rhythm with prolonged AV conduction Confirmed by Canary Brim ([id number]) on 11/20/2023 8:44:27 AM   PPM Interrogation-  reviewed in detail today,  See PACEART report.  Device History: Medtronic Dual Chamber PPM implanted 03/14/2020 for Second Degree AV block  Studies:  ECHO 03/2020 > LVEF 60-65%  ECHO 02/2023 > LVEF 55-60%, G1DD   Arrhythmia / AAD Second Degree AVB, 2:1   Risk Assessment/Calculations:     HYPERTENSION CONTROL Vitals:   11/20/23 0826 11/20/23 1304  BP: (!) 172/86 (!) 140/90    The patient's blood pressure is elevated above target today.  In order to address the patient's elevated BP: A new medication was prescribed today.           Physical Exam:    VS:  BP (!) 140/90   Pulse 60   Ht 5\' 3"  (1.6 m)   Wt 147 lb 6.4 oz (66.9 kg)   SpO2 98%   BMI 26.11 kg/m    Wt Readings from Last 3 Encounters:  11/20/23 147 lb 6.4 oz (66.9 kg)  11/07/23 156 lb 6 oz (70.9 kg)  09/03/23 153 lb (69.4 kg)     GEN: Well nourished, well developed in no acute distress NECK: No JVD; No carotid bruits CARDIAC: Regular rate and rhythm, no murmurs, rubs, gallops RESPIRATORY:  Clear to auscultation without rales, wheezing or rhonchi  ABDOMEN: Soft, non-tender, non-distended EXTREMITIES:  No edema; No deformity   ASSESSMENT AND PLAN:    Second Degree AV block s/p Medtronic PPM  -Normal PPM function -See Pace Art report -No changes today  Hypertension  -elevated in clinic > initial BP 172/86 > subsequent reading 140/90 -initiate losartan 50 mg daily for BP control  -follow up BMP in 7-10 days  -reassess in one month in clinic   Chest Pressure, Shortness of Breath with Exertion Former remote smoker 3 years total  -assess Coronary CT to r/o coronary disease with above symptoms, imaging discussed with patient who agrees to proceed  -labs as above for CT & medication changes   Hyperlipidemia  -per primary       Disposition:   Follow up with EP APP in 4 weeks  Signed, Canary Brim, NP-C, AGACNP-BC [facility] - Electrophysiology  11/20/2023, 1:15  PM

## 2023-11-19 NOTE — Progress Notes (Signed)
 Remote pacemaker transmission.

## 2023-11-20 ENCOUNTER — Encounter: Payer: Self-pay | Admitting: Pulmonary Disease

## 2023-11-20 ENCOUNTER — Ambulatory Visit: Attending: Pulmonary Disease | Admitting: Pulmonary Disease

## 2023-11-20 VITALS — BP 140/90 | HR 60 | Ht 63.0 in | Wt 147.4 lb

## 2023-11-20 DIAGNOSIS — Z950 Presence of cardiac pacemaker: Secondary | ICD-10-CM | POA: Diagnosis not present

## 2023-11-20 DIAGNOSIS — I441 Atrioventricular block, second degree: Secondary | ICD-10-CM

## 2023-11-20 DIAGNOSIS — R072 Precordial pain: Secondary | ICD-10-CM

## 2023-11-20 LAB — CUP PACEART INCLINIC DEVICE CHECK
Date Time Interrogation Session: 20250318130415
Implantable Lead Connection Status: 753985
Implantable Lead Connection Status: 753985
Implantable Lead Implant Date: 20210711
Implantable Lead Implant Date: 20210711
Implantable Lead Location: 753859
Implantable Lead Location: 753860
Implantable Lead Model: 5076
Implantable Lead Model: 5076
Implantable Pulse Generator Implant Date: 20210711

## 2023-11-20 MED ORDER — LOSARTAN POTASSIUM 50 MG PO TABS: 50.0000 mg | ORAL_TABLET | Freq: Every day | ORAL | 11 refills | Status: AC

## 2023-11-20 MED ORDER — METOPROLOL TARTRATE 25 MG PO TABS: 25.0000 mg | ORAL_TABLET | Freq: Once | ORAL | 0 refills | Status: DC

## 2023-11-20 NOTE — Patient Instructions (Signed)
 Medication Instructions:  Start losartan 50 mg daily. *If you need a refill on your cardiac medications before your next appointment, please call your pharmacy*   Lab Work: BMP-2 days prior coronary CTA If you have labs (blood work) drawn today and your tests are completely normal, you will receive your results only by: MyChart Message (if you have MyChart) OR A paper copy in the mail If you have any lab test that is abnormal or we need to change your treatment, we will call you to review the results.   Testing/Procedures:   Your cardiac CT will be scheduled at one of the below locations:   [facility] [address] [place], [place] [address] [phone]  OR  [facility] [address] [place], [place] [address] [phone]  OR   [facility] [address] [place], [place] [address] [phone]  OR   [facility] [address] [place], [place] [address] [phone]  If scheduled at [facility], please arrive at the [facility] and Children's Entrance (Entrance C2) of [facility] 30 minutes prior to test start time. You can use the FREE valet parking offered at entrance C (encouraged to control the heart rate for the test)  Proceed to the [facility] Radiology Department (first floor) to check-in and test prep.  All radiology patients and guests should use entrance C2 at [facility], accessed from [facility], even though the hospital's physical address listed is [address]    If scheduled at [facility] or [facility], please arrive 15 mins early for check-in and test prep.  There is spacious parking and easy access to the radiology department from the [facility] Heart and Vascular entrance. Please enter here and check-in with the desk attendant.   If scheduled at [facility], please arrive 30 minutes early for check-in and test prep.  Please follow these instructions carefully (unless otherwise directed):  An IV will be required for this test and Nitroglycerin will be given.  Hold all erectile dysfunction medications at least 3 days (72 hrs) prior to test. (Ie viagra, cialis, sildenafil, tadalafil, etc)   On the Night Before the Test: Be sure to Drink plenty of water. Do not consume any caffeinated/decaffeinated beverages or chocolate 12 hours prior to your test. Do not take any antihistamines 12 hours prior to your test. If the patient has contrast allergy: Patient will need a prescription for Prednisone and very clear instructions (as follows): Prednisone 50 mg - take 13 hours prior to test Take another Prednisone 50 mg 7 hours prior to test Take another Prednisone 50 mg 1 hour prior to test Take Benadryl 50 mg 1 hour prior to test Patient must complete all four doses of above prophylactic medications. Patient will need a ride after test due to Benadryl.  On the Day of the Test: Drink plenty of water until 1 hour prior to the test. Do not eat any food 1 hour prior to test. You may take your regular medications prior to the test.  Take metoprolol (Lopressor) two hours prior to test. If you take Furosemide/Hydrochlorothiazide/Spironolactone/Chlorthalidone, please HOLD on the morning of the test. Patients who wear a continuous glucose monitor MUST remove the device prior to scanning. FEMALES- please wear underwire-free bra if available, avoid dresses & tight clothing      After the Test: Drink plenty of water.  After receiving IV contrast, you may experience a mild flushed feeling. This is normal. On occasion, you may experience a mild rash up to 24 hours after the test. This is not dangerous. If this occurs, you can take Benadryl 25 mg, Zyrtec, Claritin, or Allegra and increase your fluid intake. (Patients taking Tikosyn should avoid Benadryl,  and may take Zyrtec, Claritin, or Allegra) If you experience trouble breathing, this can be serious. If it is severe call 911 IMMEDIATELY. If it is mild, please call our office.  We will call to schedule your test 2-4 weeks out understanding that some insurance companies will need an authorization prior to the service being performed.   For more information and frequently asked questions, please visit our website : [email or web address]  For non-scheduling related questions, please contact the cardiac imaging nurse navigator should you have any questions/concerns: Cardiac Imaging Nurse Navigators Direct Office Dial: [phone]   For scheduling needs, including cancellations and rescheduling, please call [place], [phone].    Follow-Up: At [facility], you and your health needs are our priority.  As part of our continuing mission to provide you with exceptional heart care, we have created designated Provider Care Teams.  These Care Teams include your primary Cardiologist (physician) and Advanced Practice Providers (APPs -  Physician Assistants and Nurse Practitioners) who all work together to provide you with the care you need, when you need it.  We recommend signing up for the patient portal called "MyChart".  Sign up information is provided on this After Visit Summary.  MyChart is used to connect with patients for Virtual Visits (Telemedicine).  Patients are able to view lab/test results, encounter notes, upcoming appointments, etc.  Non-urgent messages can be sent to your provider as well.   To learn more about what you can do with MyChart, go to [email or web address].    Your next appointment:   1 month(s)  Provider:   Canary Brim, NP

## 2023-11-23 ENCOUNTER — Encounter: Payer: Self-pay | Admitting: Family Medicine

## 2023-11-28 ENCOUNTER — Encounter: Payer: Self-pay | Admitting: Family Medicine

## 2023-11-28 ENCOUNTER — Ambulatory Visit: Admitting: Family Medicine

## 2023-11-28 VITALS — BP 118/80 | HR 70 | Temp 98.0°F | Resp 16 | Ht 63.0 in | Wt 153.2 lb

## 2023-11-28 DIAGNOSIS — I10 Essential (primary) hypertension: Secondary | ICD-10-CM | POA: Diagnosis not present

## 2023-11-28 DIAGNOSIS — Z95811 Presence of heart assist device: Secondary | ICD-10-CM

## 2023-11-28 DIAGNOSIS — I442 Atrioventricular block, complete: Secondary | ICD-10-CM

## 2023-11-28 DIAGNOSIS — R197 Diarrhea, unspecified: Secondary | ICD-10-CM

## 2023-11-28 DIAGNOSIS — R1033 Periumbilical pain: Secondary | ICD-10-CM | POA: Diagnosis not present

## 2023-11-28 NOTE — Assessment & Plan Note (Signed)
 CHB s/p pacemaker placement.

## 2023-11-28 NOTE — Assessment & Plan Note (Signed)
 Patient is started on losartan 50 mg daily at her cardiologist office. She is not monitoring BP at home, recommend doing so. BMP has not been done, added to today labs.

## 2023-11-28 NOTE — Assessment & Plan Note (Signed)
 S/P pacemaker placement. Follows with cardiologist annually, last seen on 11/20/23.

## 2023-11-28 NOTE — Progress Notes (Unsigned)
 ACUTE VISIT Chief Complaint  Patient presents with   Abdominal Pain   HPI: [name] is a 68 y.o. female, who is here today complaining of lower abdominal pain with associated diarrhea and fecal frequency starting Saturday 3/22.   Abdominal Pain This is a new problem. The current episode started in the past 7 days. The onset quality is gradual. The problem occurs intermittently. The problem has been unchanged. The pain is located in the periumbilical region. The pain is moderate. The quality of the pain is cramping. The abdominal pain does not radiate. Associated symptoms include diarrhea and flatus. Pertinent negatives include no anorexia, arthralgias, belching, dysuria, fever, frequency, headaches, hematochezia, hematuria or melena. Nothing aggravates the pain. The pain is relieved by Bowel movements.   Her pain temporarily improves after drinking chamomile tea or after bowel movements.  She has also been taking Pepto Bismol to manage her symptoms.  Today, she has had 4-5 movements.  To her knowledge, she has not had any suspicious foods that may have caused these symptoms.   Denies any fevers, chills, nausea, vomiting, blood in stools, or urinary symptoms.  No cough or other respiratory symptoms.  She has not recently been on any antibiotics.  Her grandchildren have had similar symptoms but she has not been in contact with them.  Hypertension: Today she reports chest discomfort, which she addressed with her cardiologist recently She mentioned that her BP was "very high" during her cardiologist office, she was started on losartan 50 mg daily, which she has tolerated well. She is not monitoring BP at home. She was supposed to have BMP 7 to 10 days after starting medication.  Lab Results  Component Value Date   NA 137 11/07/2023   CL 99 11/07/2023   K 3.6 11/07/2023   CO2 29 11/07/2023   BUN 17 11/07/2023   CREATININE 0.69 11/07/2023   GFR 88.19 11/07/2023   CALCIUM  9.9 11/07/2023   ALBUMIN 4.2 06/01/2023   GLUCOSE 151 (H) 11/07/2023   Review of Systems  Constitutional:  Positive for activity change, appetite change and fatigue. Negative for fever.  HENT:  Negative for congestion, rhinorrhea and sore throat.   Respiratory:  Negative for cough and wheezing.   Cardiovascular:  Negative for palpitations and leg swelling.  Gastrointestinal:  Positive for abdominal pain, diarrhea and flatus. Negative for anorexia, hematochezia and melena.  Genitourinary:  Negative for decreased urine volume, dysuria, frequency, hematuria, vaginal discharge and vaginal pain.  Musculoskeletal:  Negative for arthralgias.  Skin:  Negative for rash.  Neurological:  Negative for syncope, weakness and headaches.  Psychiatric/Behavioral:  Negative for confusion. The patient is nervous/anxious.   See other pertinent positives and negatives in HPI.  Current Outpatient Medications on File Prior to Visit  Medication Sig Dispense Refill   aspirin EC 81 MG tablet Take 81 mg by mouth daily. Swallow whole.     B Complex Vitamins (VITAMIN B COMPLEX PO) Take 1 tablet by mouth daily.     Cyanocobalamin (VITAMIN B 12) 100 MCG LOZG Take by mouth.     doxylamine, Sleep, (UNISOM) 25 MG tablet Take 25 mg by mouth at bedtime as needed. As need     fluticasone (FLONASE) 50 MCG/ACT nasal spray Place 1 spray into both nostrils 2 (two) times daily. 16 g 0   levothyroxine (SYNTHROID) 75 MCG tablet Take 1 tablet (75 mcg total) by mouth daily before breakfast. 90 tablet 3   losartan (COZAAR) 50 MG tablet Take 1 tablet (  50 mg total) by mouth daily. 30 tablet 11   Omega 3 1000 MG CAPS Take 1,000 mg by mouth daily.     omeprazole (PRILOSEC) 40 MG capsule Take 1 capsule (40 mg total) by mouth daily. 60 capsule 0   OVER THE COUNTER MEDICATION Vitamin D 3 1000 mg     rosuvastatin (CRESTOR) 10 MG tablet Take 1 tablet (10 mg total) by mouth daily. 90 tablet 3   metoprolol tartrate (LOPRESSOR) 25 MG tablet  Take 1 tablet (25 mg total) by mouth once for 1 dose. Take 2 hours prior to CT 1 tablet 0   No current facility-administered medications on file prior to visit.   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   w/ symptomatic bradycardia, rate 30s;  s/p  PPM 03-14-2020   Myalgia, multiple sites    Pre-diabetes    Symptomatic bradycardia 03/12/2020   s/p PPM   Wears glasses    Wears partial dentures    upper and lower   No Known Allergies  Social History   Socioeconomic History   Marital status: Legally Separated    Spouse name: Not on file   Number of children: Not on file   Years of education: Not on file   Highest education level: Not on file  Occupational History   Not on file  Tobacco Use   Smoking status: Never   Smokeless tobacco: Never  Vaping Use   Vaping status: Never Used  Substance and Sexual Activity   Alcohol use: Yes    Comment: socially   Drug use: Never   Sexual activity: Yes    Birth control/protection: Post-menopausal, Surgical  Other Topics Concern   Not on file  Social History Narrative   Not on file   Social Drivers of Health   Financial Resource Strain: Low Risk  (02/22/2022)   Overall Financial Resource Strain (CARDIA)    Difficulty of Paying Living Expenses: Not hard at all  Food Insecurity: No Food  Insecurity (02/22/2022)   Hunger Vital Sign    Worried About Running Out of Food in the Last Year: Never true    Ran Out of Food in the Last Year: Never true  Transportation Needs: No Transportation Needs (02/22/2022)   PRAPARE - Administrator, Civil Service (Medical): No    Lack of Transportation (Non-Medical): No  Physical Activity: Insufficiently Active (02/22/2022)   Exercise Vital Sign    Days of Exercise per Week: 2 days    Minutes of Exercise per Session: 60 min  Stress: No Stress Concern Present (02/22/2022)   [employer] of Occupational Health - Occupational Stress Questionnaire    Feeling of Stress : Not at all  Social Connections: Unknown (02/22/2022)   Social Connection and Isolation Panel [NHANES]  Frequency of Communication with Friends and Family: More than three times a week    Frequency of Social Gatherings with Friends and Family: More than three times a week    Attends Religious Services: Not on file    Active Member of Clubs or Organizations: Yes    Attends Banker Meetings: More than 4 times per year    Marital Status: Separated    Vitals:   11/28/23 1419  BP: 118/80  Pulse: 70  Resp: 16  Temp: 98 F (36.7 C)  SpO2: 98%   Body mass index is 27.14 kg/m.  Physical Exam Vitals and nursing note reviewed.  Constitutional:      General: She is not in acute distress.    Appearance: She is well-developed.  HENT:     Head: Normocephalic and atraumatic.     Mouth/Throat:     Mouth: Mucous membranes are moist.     Pharynx: Oropharynx is clear.  Eyes:     General: No scleral icterus.    Conjunctiva/sclera: Conjunctivae normal.  Cardiovascular:     Rate and Rhythm: Normal rate and regular rhythm.     Heart sounds: No murmur heard. Pulmonary:     Effort: Pulmonary effort is normal. No respiratory distress.     Breath sounds: Normal breath sounds.  Abdominal:     General: Bowel sounds are normal.     Palpations: Abdomen is  soft. There is no hepatomegaly or mass.     Tenderness: There is abdominal tenderness in the periumbilical area. There is no right CVA tenderness, left CVA tenderness, guarding or rebound.  Lymphadenopathy:     Cervical: No cervical adenopathy.  Skin:    General: Skin is warm.     Findings: No erythema or rash.  Neurological:     General: No focal deficit present.     Mental Status: She is alert and oriented to person, place, and time.     Gait: Gait normal.  Psychiatric:        Mood and Affect: Affect normal. Mood is anxious.    ASSESSMENT AND PLAN: [name] was seen today for abdominal pain.  Lab Results  Component Value Date   NA 137 11/28/2023   CL 99 11/28/2023   K 4.2 11/28/2023   CO2 30 11/28/2023   BUN 16 11/28/2023   CREATININE 0.74 11/28/2023   GFR 82.18 11/28/2023   CALCIUM 9.6 11/28/2023   ALBUMIN 4.6 11/28/2023   GLUCOSE 127 (H) 11/28/2023   Lab Results  Component Value Date   ALT 23 11/28/2023   AST 30 11/28/2023   ALKPHOS 99 11/28/2023   BILITOT 0.3 11/28/2023   Lab Results  Component Value Date   WBC 7.7 11/28/2023   HGB 14.7 11/28/2023   HCT 43.4 11/28/2023   MCV 92.5 11/28/2023   PLT 284.0 11/28/2023   Lab Results  Component Value Date   CRP <1.0 11/28/2023   Abdominal pain, periumbilical We discussed possible etiologies. I do not think imaging is needed at this time. Monitor for new symptoms. She has not tolerated opioids in the past, so continue Tylenol 500 mg 3-4 times per day as needed. Instructed about warning signs.  -     Comprehensive metabolic panel; Future -     CBC; Future -     C-reactive protein; Future  Acute diarrhea ?  Viral colitis. Continue adequate hydration. OTC Imodium 1 to 2 tablets daily may help.  -     Comprehensive metabolic  panel; Future -     CBC; Future -     C-reactive protein; Future  Essential (primary) hypertension Assessment & Plan: Patient is started on losartan 50 mg daily at her  cardiologist office. She is not monitoring BP at home, recommend doing so. BMP has not been done, added to today labs.  Presence of heart assist device [facility]) Assessment & Plan: CHB s/p pacemaker placement.   Complete AV block (HCC) Assessment & Plan: S/P pacemaker placement. Follows with cardiologist annually, last seen on 11/20/23.   Return if symptoms worsen or fail to improve.  I, Isabelle Course, acting as a scribe for Linzi Ohlinger [place], MD., have documented all relevant documentation on the behalf of Tiffany Bartosiewicz [place], MD, as directed by  Ryn Peine [place], MD while in the presence of Tiffany Cephus [place], MD.  I, Kween Bacorn [place], MD, have reviewed all documentation for this visit. The documentation on 11/28/23 for the exam, diagnosis, procedures, and orders are all accurate and complete.  Nakshatra Klose G. [place], MD  [facility]. [place] office.

## 2023-11-28 NOTE — Patient Instructions (Addendum)
 A few things to remember from today's visit:  Abdominal pain, periumbilical - Plan: Comprehensive metabolic panel, CBC, C-reactive protein  Acute diarrhea - Plan: Comprehensive metabolic panel, CBC, C-reactive protein  Imodium 1-2 at dia si lo necesita. Midase la temperature. Si vomito o fiebre va a urgencias.  Do not use My Chart to request refills or for acute issues that need immediate attention. If you send a my chart message, it may take a few days to be addressed, specially if I am not in the office.  Please be sure medication list is accurate. If a new problem present, please set up appointment sooner than planned today.

## 2023-11-29 ENCOUNTER — Encounter: Payer: Self-pay | Admitting: Family Medicine

## 2023-11-29 LAB — COMPREHENSIVE METABOLIC PANEL WITH GFR
ALT: 23 U/L (ref 0–35)
AST: 30 U/L (ref 0–37)
Albumin: 4.6 g/dL (ref 3.5–5.2)
Alkaline Phosphatase: 99 U/L (ref 39–117)
BUN: 16 mg/dL (ref 6–23)
CO2: 30 meq/L (ref 19–32)
Calcium: 9.6 mg/dL (ref 8.4–10.5)
Chloride: 99 meq/L (ref 96–112)
Creatinine, Ser: 0.74 mg/dL (ref 0.40–1.20)
GFR: 82.18 mL/min (ref >60.00)
Glucose, Bld: 127 mg/dL — ABNORMAL HIGH (ref 70–99)
Potassium: 4.2 meq/L (ref 3.5–5.1)
Sodium: 137 meq/L (ref 135–145)
Total Bilirubin: 0.3 mg/dL (ref 0.2–1.2)
Total Protein: 7.5 g/dL (ref 6.0–8.3)

## 2023-11-29 LAB — C-REACTIVE PROTEIN: CRP: <1 mg/dL (ref 0.5–20.0)

## 2023-11-29 LAB — CBC
HCT: 43.4 % (ref 36.0–46.0)
Hemoglobin: 14.7 g/dL (ref 12.0–15.0)
MCHC: 33.8 g/dL (ref 30.0–36.0)
MCV: 92.5 fl (ref 78.0–100.0)
Platelets: 284 10*3/uL (ref 150.0–400.0)
RBC: 4.69 Mil/uL (ref 3.87–5.11)
RDW: 13.7 % (ref 11.5–15.5)
WBC: 7.7 10*3/uL (ref 4.0–10.5)

## 2023-12-10 NOTE — Progress Notes (Signed)
 [facility] - Clinic Note  12/18/2023   CHIEF COMPLAINT Patient presents for Retina Follow Up  HISTORY OF PRESENT ILLNESS: Tiffany Berg is a 68 y.o. female who presents to the clinic today for:  HPI     Retina Follow Up   Patient presents with  Wet AMD.  In right eye.  This started 5 weeks ago.  I, the attending physician,  performed the HPI with the patient and updated documentation appropriately.        Comments   Patient here for 5 weeks retina follow up for exu ARMD OD. Patient states vision thinks getting better. No eye pain.       Last edited by Ronelle Coffee, MD on 12/18/2023  4:50 PM.     Pt states she has had no vision changes since last visit.  Referring physician: Florance Hun, MD [address] [place],  [place] [address]  HISTORICAL INFORMATION:  Selected notes from the MEDICAL RECORD NUMBER Referred by Dr. Carloyn Chi for ARMD eval LEE:  Ocular Hx- PMH-   CURRENT MEDICATIONS: No current outpatient medications on file. (Ophthalmic Drugs)   No current facility-administered medications for this visit. (Ophthalmic Drugs)   Current Outpatient Medications (Other)  Medication Sig   aspirin EC 81 MG tablet Take 81 mg by mouth daily. Swallow whole.   B Complex Vitamins (VITAMIN B COMPLEX PO) Take 1 tablet by mouth daily.   Cyanocobalamin (VITAMIN B 12) 100 MCG LOZG Take by mouth.   doxylamine, Sleep, (UNISOM) 25 MG tablet Take 25 mg by mouth at bedtime as needed. As need   fluticasone (FLONASE) 50 MCG/ACT nasal spray Place 1 spray into both nostrils 2 (two) times daily.   levothyroxine (SYNTHROID) 75 MCG tablet Take 1 tablet (75 mcg total) by mouth daily before breakfast.   losartan (COZAAR) 50 MG tablet Take 1 tablet (50 mg total) by mouth daily.   Omega 3 1000 MG CAPS Take 1,000 mg by mouth daily.   omeprazole (PRILOSEC) 40 MG capsule Take 1 capsule (40 mg total) by mouth daily.   ondansetron (ZOFRAN-ODT) 4 MG disintegrating tablet Take 1  tablet (4 mg total) by mouth 2 (two) times daily as needed for up to 7 days for nausea or vomiting.   OVER THE COUNTER MEDICATION Vitamin D 3 1000 mg   rosuvastatin (CRESTOR) 10 MG tablet Take 1 tablet (10 mg total) by mouth daily.   metoprolol tartrate (LOPRESSOR) 25 MG tablet Take 1 tablet (25 mg total) by mouth once for 1 dose. Take 2 hours prior to CT   No current facility-administered medications for this visit. (Other)   REVIEW OF SYSTEMS: ROS   Positive for: Gastrointestinal, Endocrine, Cardiovascular, Eyes Negative for: Constitutional, Neurological, Skin, Genitourinary, Musculoskeletal, HENT, Respiratory, Psychiatric, Allergic/Imm, Heme/Lymph Last edited by Sylvan Evener, COA on 12/18/2023  2:18 PM.     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   w/ symptomatic bradycardia, rate 30s;  s/p  PPM 03-14-2020   Myalgia, multiple sites    Pre-diabetes    Symptomatic bradycardia 03/12/2020   s/p PPM   Wears glasses    Wears partial dentures    upper and lower   Past Surgical History:  Procedure Laterality Date   Quintella Reichert OSTEOTOMY Right 10/15/2020   Procedure: Quintella Reichert OSTEOTOMY;  Surgeon: Edwin Cap, DPM;  Location: [facility];  Service: Podiatry;  Laterality: Right;   REGIONAL BLOCK   AIKEN OSTEOTOMY Left 02/24/2022   Procedure: Quintella Reichert OSTEOTOMY;  Surgeon: Edwin Cap, DPM;  Location: [facility];  Service: Podiatry;  Laterality: Left;   ANTERIOR AND POSTERIOR REPAIR N/A 02/18/2015   Procedure: ANTERIOR (CYSTOCELE) AND POSTERIOR REPAIR (RECTOCELE);  Surgeon: Osborn Coho, MD;  Location: [facility];  Service: Gynecology;  Laterality: N/A;   BLADDER SUSPENSION N/A 02/18/2015   Procedure: TRANSVAGINAL TAPE (TVT) PROCEDURE;  Surgeon: Osborn Coho, MD;  Location: [facility];  Service: Gynecology;  Laterality: N/A;   COLONOSCOPY     pt states she might have had one many years ago   CYSTOSCOPY Bilateral 02/18/2015   Procedure: CYSTOSCOPY;  Surgeon: Osborn Coho, MD;  Location: [facility];  Service: Gynecology;  Laterality: Bilateral;   GRAFT APPLICATION Left 02/24/2022   Procedure: BONE GRAFT;  Surgeon: Edwin Cap, DPM;  Location: [facility];  Service: Podiatry;  Laterality: Left;   HALLUX VALGUS LAPIDUS Right 10/15/2020   Procedure: HALLUX VALGUS LAPIDUS AND BONE GRAFT;  Surgeon: Edwin Cap, DPM;  Location: [facility] [place];  Service: Podiatry;  Laterality: Right;  WITH IV SEDATION; REGIONAL BLOCK   HALLUX VALGUS LAPIDUS Left 02/24/2022   Procedure: HALLUX VALGUS LAPIDUS;  Surgeon: Edwin Cap, DPM;  Location: [facility];  Service: Podiatry;  Laterality: Left;   HAMMER TOE SURGERY Right 10/15/2020   Procedure: HAMMER TOE CORRECTION;  Surgeon: Edwin Cap, DPM;  Location: [facility];  Service: Podiatry;  Laterality: Right;  REGIONAL BLOCK   HAMMER TOE SURGERY Left 02/24/2022   Procedure: HAMMER TOE CORRECTION SECOND TOE LEFT FOOT;  Surgeon: Edwin Cap, DPM;  Location: [facility] ;  Service: Podiatry;  Laterality: Left;   LAPAROSCOPIC ASSISTED VAGINAL HYSTERECTOMY N/A 02/18/2015   Procedure: LAPAROSCOPIC ASSISTED VAGINAL HYSTERECTOMY;  Surgeon: Osborn Coho, MD;  Location: [facility];  Service: Gynecology;  Laterality: N/A;   MASTECTOMY, PARTIAL Left 08-12-2013  @[facility]    excision another site   PACEMAKER IMPLANT N/A 03/14/2020   Procedure: PACEMAKER IMPLANT;  Surgeon: Regan Lemming, MD;  Location: [facility];  Service: Cardiovascular;  Laterality: N/A;   RADIOACTIVE SEED GUIDED PARTIAL MASTECTOMY/AXILLARY SENTINEL NODE BIOPSY/AXILLARY NODE DISSECTION Left 07-22-2013  @[facility]    SALPINGOOPHORECTOMY Bilateral 02/18/2015   Procedure: BILATERAL SALPINGO OOPHORECTOMY;  Surgeon: Osborn Coho, MD;  Location: [facility];  Service: Gynecology;  Laterality: Bilateral;   FAMILY HISTORY Family History  Problem Relation Age of Onset   Heart disease Mother    Colon cancer Father    Cancer Father        prostata   Stomach cancer Sister    Cancer Sister 54       Breast   Esophageal cancer Neg Hx    Rectal cancer Neg Hx  SOCIAL HISTORY Social History   Tobacco Use   Smoking status: Never   Smokeless tobacco: Never  Vaping Use   Vaping status: Never Used  Substance Use Topics   Alcohol use: Yes    Comment: socially   Drug use: Never       OPHTHALMIC EXAM:  Base Eye Exam     Visual Acuity (Snellen - Linear)       Right Left   Dist cc 20/25 +2 20/30 -1    Correction: Glasses         Tonometry (Tonopen, 2:15 PM)       Right Left   Pressure 18 16         Neuro/Psych     Oriented x3: Yes   Mood/Affect: Normal         Dilation     Both eyes: 1.0% Mydriacyl, 2.5% Phenylephrine @ 2:15 PM           Slit Lamp and Fundus Exam     Slit Lamp Exam       Right Left   Lids/Lashes Dermatochalasis - upper lid Dermatochalasis - upper lid   Conjunctiva/Sclera mild nasal pterygium mild nasal pterygium   Cornea nasal pterygium, trace tear film debris nasal pterygium, trace tear film debris, 1+ PEE   Anterior Chamber deep and clear deep and clear   Iris Round and dilated, No NVI Round and dilated, No NVI    Lens 2+ Nuclear sclerosis, 2-3+ Cortical cataract 2+ Nuclear sclerosis, 2+ Cortical cataract   Anterior Vitreous mild syneresis mild syneresis         Fundus Exam       Right Left   Disc Pink and Sharp, Compact Pink and Sharp, Compact   C/D Ratio 0.4 0.4   Macula Flat, Blunted foveal reflex, focal CNV with shallow SRF and edema ST macula -- stably improved, mild drusen, no frank heme Flat, Good foveal reflex, fine drusen, No heme or edema   Vessels attenuated, mild tortuosity attenuated, mild tortuosity   Periphery Attached, No heme Attached, No heme           Refraction     Wearing Rx       Sphere Cylinder Axis Add   Right -0.50 +0.75 114 +2.25   Left -0.50 +2.50 066 +2.25           IMAGING AND PROCEDURES  Imaging and Procedures for 12/18/2023  OCT, Retina - OU - Both Eyes       Right Eye Quality was good. Central Foveal Thickness: 245. Progression has been stable. Findings include normal foveal contour, no IRF, no SRF, retinal drusen , subretinal hyper-reflective material, pigment epithelial detachment (Focal PED with stable improvement in shallow SRF ST to fovea, partial PVD ).   Left Eye Quality was good. Central Foveal Thickness: 247. Progression has been stable. Findings include normal foveal contour, no IRF, no SRF, retinal drusen .   Notes *Images captured and stored on drive  Diagnosis / Impression:  OD: Focal PED/CNV with stable improvement in shallow SRF ST to fovea, partial PVD OS: NFP, no IRF/SRF  Clinical management:  See below  Abbreviations: NFP - Normal foveal profile. CME - cystoid macular edema. PED - pigment epithelial detachment. IRF - intraretinal fluid. SRF - subretinal fluid. EZ - ellipsoid zone. ERM - epiretinal membrane. ORA - outer retinal atrophy. ORT - outer retinal tubulation. SRHM - subretinal hyper-reflective material. IRHM - intraretinal hyper-reflective material      Intravitreal Injection,  Pharmacologic Agent - OD - Right  Eye       Time Out 12/18/2023. 2:50 PM. Confirmed correct patient, procedure, site, and patient consented.   Anesthesia Topical anesthesia was used. Anesthetic medications included Lidocaine 2%, Proparacaine 0.5%.   Procedure Preparation included 5% betadine to ocular surface, eyelid speculum. A supplied needle was used.   Injection: 1.25 mg Bevacizumab 1.25mg /0.77ml   Route: Intravitreal, Site: Right Eye   NDC: [id number], Lot: [id number], Expiration date: 01/19/2024   Post-op Post injection exam found visual acuity of at least counting fingers. The patient tolerated the procedure well. There were no complications. The patient received written and verbal post procedure care education.           ASSESSMENT/PLAN:   ICD-10-CM   1. Exudative age-related macular degeneration of right eye with active choroidal neovascularization (HCC)  H35.3211 OCT, Retina - OU - Both Eyes    Intravitreal Injection, Pharmacologic Agent - OD - Right Eye    Bevacizumab (AVASTIN) SOLN 1.25 mg    2. Intermediate stage nonexudative age-related macular degeneration of left eye  H35.3122     3. Diabetes mellitus type 2 without retinopathy (HCC)  E11.9     4. Essential hypertension  I10     5. Hypertensive retinopathy of both eyes  H35.033     6. Combined forms of age-related cataract of both eyes  H25.813      Exudative age related macular degeneration OD  - s/p IVA OD #1 (01.14.25), #2 (02.11.25), #3 (03.11.25)  - FA (01.14.24) shows foval CNV with leakage superior fovea  - BCVA OD 20/25 -- stable  - OCT shows focal PED/CNV with stable improvement in shallow SRF ST to fovea at 5 wks  - recommend IVA OD #4 today, 04.15.25 w/ f/u ext to 7 wks  - pt wishes to be treated with IVA  - RBA of procedure discussed, questions answered - IVA informed consent obtained and signed, 01.14.25 (OD) - see procedure note - f/u in 7 wks, DFE, OCT, possible injxn  2. Age related macular degeneration, non-exudative,  OS  - The incidence, anatomy, and pathology of dry AMD, risk of progression, and the AREDS and AREDS 2 study including smoking risks discussed with patient.  - Recommend amsler grid monitoring  - monitor  3. Diabetes mellitus, type 2 without retinopathy  - A1c: 6.4 on 09.27.24 - The incidence, risk factors for progression, natural history and treatment options for diabetic retinopathy  were discussed with patient.   - The need for close monitoring of blood glucose, blood pressure, and serum lipids, avoiding cigarette or any type of tobacco, and the need for long term follow up was also discussed with patient. - f/u in 1 year, sooner prn  4,5. Hypertensive retinopathy OU - discussed importance of tight BP control - monitor  6. Mixed Cataract OU - The symptoms of cataract, surgical options, and treatments and risks were discussed with patient. - discussed diagnosis and progression - monitor   Ophthalmic Meds Ordered this visit:  Meds ordered this encounter  Medications   Bevacizumab (AVASTIN) SOLN 1.25 mg     Return in about 7 weeks (around 02/05/2024) for ex ARMD OD - DFE, OCT, Possible Injxn.  There are no Patient Instructions on file for this visit.  Explained the diagnoses, plan, and follow up with the patient and they expressed understanding.  Patient expressed understanding of the importance of proper follow up care.   This document serves as a record of services  personally performed by Jeanice Millard, MD, PhD. It was created on their behalf by Olene Berne, COT an ophthalmic technician. The creation of this record is the provider's dictation and/or activities during the visit.    Electronically signed by:  Olene Berne, COT  12/18/23 4:51 PM  This document serves as a record of services personally performed by Jeanice Millard, MD, PhD. It was created on their behalf by Diona Franklin, COMT. The creation of this record is the provider's dictation and/or activities  during the visit.  Electronically signed by: Diona Franklin, COMT 12/18/23 4:51 PM  Jeanice Millard, M.D., Ph.D. Diseases & Surgery of the Retina and Vitreous [facility] 12/18/2023   I have reviewed the above documentation for accuracy and completeness, and I agree with the above. Jeanice Millard, M.D., Ph.D. 12/18/23 4:52 PM    Abbreviations: M myopia (nearsighted); A astigmatism; H hyperopia (farsighted); P presbyopia; Mrx spectacle prescription;  CTL contact lenses; OD right eye; OS left eye; OU both eyes  XT exotropia; ET esotropia; PEK punctate epithelial keratitis; PEE punctate epithelial erosions; DES dry eye syndrome; MGD meibomian gland dysfunction; ATs artificial tears; PFAT's preservative free artificial tears; NSC nuclear sclerotic cataract; PSC posterior subcapsular cataract; ERM epi-retinal membrane; PVD posterior vitreous detachment; RD retinal detachment; DM diabetes mellitus; DR diabetic retinopathy; NPDR non-proliferative diabetic retinopathy; PDR proliferative diabetic retinopathy; CSME clinically significant macular edema; DME diabetic macular edema; dbh dot blot hemorrhages; CWS cotton wool spot; POAG primary open angle glaucoma; C/D cup-to-disc ratio; HVF humphrey visual field; GVF goldmann visual field; OCT optical coherence tomography; IOP intraocular pressure; BRVO Branch retinal vein occlusion; CRVO central retinal vein occlusion; CRAO central retinal artery occlusion; BRAO branch retinal artery occlusion; RT retinal tear; SB scleral buckle; PPV pars plana vitrectomy; VH Vitreous hemorrhage; PRP panretinal laser photocoagulation; IVK intravitreal kenalog; VMT vitreomacular traction; MH Macular hole;  NVD neovascularization of the disc; NVE neovascularization elsewhere; AREDS age related eye disease study; ARMD age related macular degeneration; POAG primary open angle glaucoma; EBMD epithelial/anterior basement membrane dystrophy; ACIOL anterior chamber  intraocular lens; IOL intraocular lens; PCIOL posterior chamber intraocular lens; Phaco/IOL phacoemulsification with intraocular lens placement; PRK photorefractive keratectomy; LASIK laser assisted in situ keratomileusis; HTN hypertension; DM diabetes mellitus; COPD chronic obstructive pulmonary disease

## 2023-12-11 ENCOUNTER — Encounter: Payer: Self-pay | Admitting: Family Medicine

## 2023-12-11 ENCOUNTER — Ambulatory Visit (INDEPENDENT_AMBULATORY_CARE_PROVIDER_SITE_OTHER): Admitting: Family Medicine

## 2023-12-11 VITALS — BP 110/70 | HR 70 | Temp 97.6°F | Resp 16 | Ht 63.0 in | Wt 150.8 lb

## 2023-12-11 DIAGNOSIS — R197 Diarrhea, unspecified: Secondary | ICD-10-CM

## 2023-12-11 DIAGNOSIS — R110 Nausea: Secondary | ICD-10-CM

## 2023-12-11 DIAGNOSIS — R7303 Prediabetes: Secondary | ICD-10-CM

## 2023-12-11 MED ORDER — ONDANSETRON 4 MG PO TBDP: 4.0000 mg | ORAL_TABLET | Freq: Two times a day (BID) | ORAL | 0 refills | Status: AC | PRN

## 2023-12-11 NOTE — Progress Notes (Signed)
 ACUTE VISIT Chief Complaint  Patient presents with   Diarrhea   Nausea    Since last night   HPI: [name] is a 68 y.o. female with a PMHx significant for Mobitz II, HTN, atherosclerosis of aorta, hypothyroidism, pelvic prolapse, HLD, and prediabetes, among some, who is here today complaining of nausea and diarrhea.   Patient complains of nausea, diarrhea, and generalized abdominal pain since yesterday. She was seen for similar symptoms on 11/28/2023, states that symptoms improved but did not resolve and got worse yesterday. She says she has to go to the bathroom almost every 5 minutes.  Also endorses bloating and some heartburn, she is no longer on omeprazole 40 mg. She has not monitor temperature but she has felt warm. Diarrhea and nausea are significantly worse this time.   Last colonoscopy was in 08/2023.  She has no prior history, denies any hx of IBS.  She has been taking an OTC medication from the pharmacy, she does not recall name. Pertinent negatives include chills, dysphagia, blood in her stool, melena, vomiting, sick contacts, or contacts with people who have been outside the country.   She mentioned that she has been having shortness of breath with exertion, she had discussed this problem with her cardiologist and she has an appointment on 12/25/2023.  No associated CP or diaphoresis.  Prediabetes: Negative for polydipsia, polyphagia, polyuria.  Lab Results  Component Value Date   HGBA1C 6.4 06/01/2023   Lab Results  Component Value Date   NA 137 11/28/2023   CL 99 11/28/2023   K 4.2 11/28/2023   CO2 30 11/28/2023   BUN 16 11/28/2023   CREATININE 0.74 11/28/2023   GFR 82.18 11/28/2023   CALCIUM 9.6 11/28/2023   ALBUMIN 4.6 11/28/2023   GLUCOSE 127 (H) 11/28/2023   Lab Results  Component Value Date   WBC 7.7 11/28/2023   HGB 14.7 11/28/2023   HCT 43.4 11/28/2023   MCV 92.5 11/28/2023   PLT 284.0 11/28/2023   Lab Results  Component Value Date    ALT 23 11/28/2023   AST 30 11/28/2023   ALKPHOS 99 11/28/2023   BILITOT 0.3 11/28/2023   Lab Results  Component Value Date   CRP <1.0 11/28/2023   Lab Results  Component Value Date   TSH 5.33 06/01/2023   Review of Systems  Constitutional:  Positive for activity change, appetite change and fatigue. Negative for unexpected weight change.  HENT:  Negative for congestion, postnasal drip and sore throat.   Respiratory:  Negative for cough and wheezing.   Cardiovascular:  Negative for palpitations and leg swelling.  Endocrine: Negative for cold intolerance and heat intolerance.  Genitourinary:  Negative for decreased urine volume, dysuria and hematuria.  Skin:  Negative for rash.  Neurological:  Negative for tremors, syncope and weakness.  Psychiatric/Behavioral:  Negative for confusion and hallucinations. The patient is nervous/anxious.   See other pertinent positives and negatives in HPI.  Current Outpatient Medications on File Prior to Visit  Medication Sig Dispense Refill   aspirin EC 81 MG tablet Take 81 mg by mouth daily. Swallow whole.     B Complex Vitamins (VITAMIN B COMPLEX PO) Take 1 tablet by mouth daily.     Cyanocobalamin (VITAMIN B 12) 100 MCG LOZG Take by mouth.     doxylamine, Sleep, (UNISOM) 25 MG tablet Take 25 mg by mouth at bedtime as needed. As need     fluticasone (FLONASE) 50 MCG/ACT nasal spray Place 1 spray into both  nostrils 2 (two) times daily. 16 g 0   levothyroxine (SYNTHROID) 75 MCG tablet Take 1 tablet (75 mcg total) by mouth daily before breakfast. 90 tablet 3   losartan (COZAAR) 50 MG tablet Take 1 tablet (50 mg total) by mouth daily. 30 tablet 11   Omega 3 1000 MG CAPS Take 1,000 mg by mouth daily.     omeprazole (PRILOSEC) 40 MG capsule Take 1 capsule (40 mg total) by mouth daily. 60 capsule 0   OVER THE COUNTER MEDICATION Vitamin D 3 1000 mg     rosuvastatin (CRESTOR) 10 MG tablet Take 1 tablet (10 mg total) by mouth daily. 90 tablet 3    metoprolol tartrate (LOPRESSOR) 25 MG tablet Take 1 tablet (25 mg total) by mouth once for 1 dose. Take 2 hours prior to CT 1 tablet 0   No current facility-administered medications on file prior to visit.    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   w/ symptomatic bradycardia, rate 30s;  s/p  PPM 03-14-2020   Myalgia, multiple sites    Pre-diabetes    Symptomatic bradycardia 03/12/2020   s/p PPM   Wears glasses    Wears partial dentures    upper and lower   No Known Allergies  Social History   Socioeconomic History   Marital status: Legally Separated    Spouse name: Not on file   Number of children: Not on file   Years of education: Not on file   Highest education level: Not on file  Occupational History   Not on file  Tobacco Use   Smoking status: Never   Smokeless tobacco: Never  Vaping Use   Vaping status: Never Used  Substance and Sexual Activity   Alcohol use: Yes    Comment: socially   Drug use: Never   Sexual activity: Yes    Birth control/protection: Post-menopausal, Surgical  Other Topics Concern   Not on file  Social History Narrative   Not on file   Social Drivers of Health   Financial Resource Strain: Low Risk  (02/22/2022)   Overall Financial Resource Strain (CARDIA)    Difficulty of Paying Living Expenses:  Not hard at all  Food Insecurity: No Food Insecurity (02/22/2022)   Hunger Vital Sign    Worried About Running Out of Food in the Last Year: Never true    Ran Out of Food in the Last Year: Never true  Transportation Needs: No Transportation Needs (02/22/2022)   PRAPARE - Administrator, Civil Service (Medical): No    Lack of Transportation (Non-Medical): No  Physical Activity: Insufficiently Active (02/22/2022)   Exercise Vital Sign    Days of Exercise per Week: 2 days    Minutes of Exercise per Session: 60 min  Stress:  No Stress Concern Present (02/22/2022)   [employer] of Occupational Health - Occupational Stress Questionnaire    Feeling of Stress : Not at all  Social Connections: Unknown (02/22/2022)   Social Connection and Isolation Panel [NHANES]    Frequency of Communication with Friends and Family: More than three times a week    Frequency of Social Gatherings with Friends and Family: More than three times a week    Attends Religious Services: Not on file    Active Member of Clubs or Organizations: Yes    Attends Banker Meetings: More than 4 times per year    Marital Status: Separated   Vitals:   12/11/23 1504  BP: 110/70  Pulse: 70  Resp: 16  Temp: 97.6 F (36.4 C)  SpO2: 96%   Body mass index is 26.71 kg/m.  Physical Exam Vitals and nursing note reviewed.  Constitutional:      General: She is not in acute distress.    Appearance: She is well-developed.  HENT:     Head: Normocephalic and atraumatic.     Mouth/Throat:     Mouth: Mucous membranes are moist.     Pharynx: Oropharynx is clear. Uvula midline.  Eyes:     Conjunctiva/sclera: Conjunctivae normal.  Cardiovascular:     Rate and Rhythm: Normal rate and regular rhythm.     Heart sounds: No murmur heard. Pulmonary:     Effort: Pulmonary effort is normal. No respiratory distress.     Breath sounds: Normal breath sounds.  Abdominal:     Palpations: Abdomen is soft. There is  no mass.     Tenderness: There is generalized abdominal tenderness (Reports diffuse abdominal discomfort.). There is no guarding or rebound.  Musculoskeletal:     Right lower leg: No edema.     Left lower leg: No edema.  Skin:    General: Skin is warm.     Findings: No erythema or rash.  Neurological:     General: No focal deficit present.     Mental Status: She is alert and oriented to person, place, and time.     Cranial Nerves: No cranial nerve deficit.     Gait: Gait normal.  Psychiatric:        Mood and Affect: Affect normal. Mood is anxious.    ASSESSMENT AND PLAN:  Ms. Lamia was seen today for diarrhea and nausea.   Diarrhea, unspecified type She was seen for similar symptoms last visit, reports that she is feeling worse. She does not want blood work repeated today. Stool studies ordered today. Continue adequate hydration with clear fluids. Monitor for new symptoms. Clearly instructed about warning signs. Last colonoscopy on 09/03/2023. If problem has not resolved in about a week, she was instructed to let me know, so we can arrange GI consultation.  -     Ova and parasite examination; Future -     Stool culture; Future -     C. difficile GDH and Toxin A/B; Future  Nausea without vomiting Recommend ondansetron 4 mg twice daily as needed. Light meals/bland diet.  -     Ondansetron; Take 1 tablet (4 mg total) by mouth 2 (two) times daily as needed for up to 7 days for nausea or vomiting.  Dispense: 14 tablet; Refill: 0  Prediabetes Assessment & Plan: Last HgA1C 6.4 in 05/2023. She does not want to have A1C today.  In regard to dyspnea, she reports that it has been addressed by her cardiologist and has  an appointment on 12/25/2023 for CT coronary calcium score.  Return in about 2 weeks (around 12/25/2023), or if symptoms worsen or fail to improve.  I, Rolla Etienne Wierda, acting as a scribe for Erskine Steinfeldt [place], MD., have documented all relevant documentation on the  behalf of Tiffany Baig [place], MD, as directed by  Tiffany Ashbaugh [place], MD while in the presence of Tiffany Sligh [place], MD.   I, Marsi Turvey [place], MD, have reviewed all documentation for this visit. The documentation on 12/11/23 for the exam, diagnosis, procedures, and orders are all accurate and complete.  Tiffany Rincon G. [place], MD  [facility]. [place] office.

## 2023-12-11 NOTE — Patient Instructions (Addendum)
 A few things to remember from today's visit:  Diarrhea, unspecified type - Plan: Ova and parasite examination, Stool culture, C. difficile GDH and Toxin A/B  Nausea without vomiting - Plan: ondansetron (ZOFRAN-ODT) 4 MG disintegrating tablet  Imodium max 2 tab diario para la diarrea. Siga tomando liquidos con electrolitos. Midase la temperatura. Si no esta mejor en una semana me have saber, podemos pedir una cita con Art therapist.

## 2023-12-11 NOTE — Assessment & Plan Note (Signed)
 Last HgA1C 6.4 in 05/2023. She does not want to have A1C today.

## 2023-12-12 ENCOUNTER — Other Ambulatory Visit: Payer: Self-pay | Admitting: Family Medicine

## 2023-12-12 DIAGNOSIS — R197 Diarrhea, unspecified: Secondary | ICD-10-CM | POA: Diagnosis not present

## 2023-12-15 LAB — CLOSTRIDIUM DIFFICILE EIA: C difficile Toxins A+B, EIA: NEGATIVE

## 2023-12-15 LAB — SPECIMEN STATUS REPORT

## 2023-12-17 LAB — OVA AND PARASITE EXAMINATION
CONCENTRATE RESULT:: NONE SEEN
MICRO NUMBER:: 16308707
SPECIMEN QUALITY:: ADEQUATE

## 2023-12-17 LAB — STOOL CULTURE: E coli, Shiga toxin Assay: NEGATIVE

## 2023-12-18 ENCOUNTER — Encounter: Payer: Self-pay | Admitting: Family Medicine

## 2023-12-18 ENCOUNTER — Ambulatory Visit (INDEPENDENT_AMBULATORY_CARE_PROVIDER_SITE_OTHER): Admitting: Ophthalmology

## 2023-12-18 ENCOUNTER — Encounter (INDEPENDENT_AMBULATORY_CARE_PROVIDER_SITE_OTHER): Payer: Self-pay | Admitting: Ophthalmology

## 2023-12-18 DIAGNOSIS — H353211 Exudative age-related macular degeneration, right eye, with active choroidal neovascularization: Secondary | ICD-10-CM | POA: Diagnosis not present

## 2023-12-18 DIAGNOSIS — I10 Essential (primary) hypertension: Secondary | ICD-10-CM

## 2023-12-18 DIAGNOSIS — H353122 Nonexudative age-related macular degeneration, left eye, intermediate dry stage: Secondary | ICD-10-CM

## 2023-12-18 DIAGNOSIS — H25813 Combined forms of age-related cataract, bilateral: Secondary | ICD-10-CM | POA: Diagnosis not present

## 2023-12-18 DIAGNOSIS — H35033 Hypertensive retinopathy, bilateral: Secondary | ICD-10-CM

## 2023-12-18 DIAGNOSIS — E119 Type 2 diabetes mellitus without complications: Secondary | ICD-10-CM

## 2023-12-18 MED ORDER — BEVACIZUMAB CHEMO INJECTION 1.25MG/0.05ML SYRINGE FOR KALEIDOSCOPE
1.2500 mg | INTRAVITREAL | Status: AC | PRN
  Administered 2023-12-18: 1.25 mg via INTRAVITREAL

## 2023-12-20 ENCOUNTER — Encounter (HOSPITAL_COMMUNITY): Payer: Self-pay | Admitting: *Deleted

## 2023-12-20 ENCOUNTER — Other Ambulatory Visit: Payer: Self-pay

## 2023-12-20 ENCOUNTER — Emergency Department (HOSPITAL_COMMUNITY)
Admission: EM | Admit: 2023-12-20 | Discharge: 2023-12-21 | Disposition: A | Discharge status: Home / Self Care | Attending: Emergency Medicine | Admitting: Emergency Medicine

## 2023-12-20 ENCOUNTER — Ambulatory Visit: Payer: Self-pay

## 2023-12-20 DIAGNOSIS — R1084 Generalized abdominal pain: Secondary | ICD-10-CM | POA: Diagnosis present

## 2023-12-20 DIAGNOSIS — I10 Essential (primary) hypertension: Secondary | ICD-10-CM | POA: Diagnosis not present

## 2023-12-20 DIAGNOSIS — K5732 Diverticulitis of large intestine without perforation or abscess without bleeding: Secondary | ICD-10-CM

## 2023-12-20 DIAGNOSIS — Z950 Presence of cardiac pacemaker: Secondary | ICD-10-CM | POA: Insufficient documentation

## 2023-12-20 DIAGNOSIS — K5730 Diverticulosis of large intestine without perforation or abscess without bleeding: Secondary | ICD-10-CM | POA: Diagnosis not present

## 2023-12-20 DIAGNOSIS — R001 Bradycardia, unspecified: Secondary | ICD-10-CM | POA: Diagnosis not present

## 2023-12-20 DIAGNOSIS — R739 Hyperglycemia, unspecified: Secondary | ICD-10-CM

## 2023-12-20 DIAGNOSIS — K5792 Diverticulitis of intestine, part unspecified, without perforation or abscess without bleeding: Secondary | ICD-10-CM | POA: Diagnosis not present

## 2023-12-20 DIAGNOSIS — K8020 Calculus of gallbladder without cholecystitis without obstruction: Secondary | ICD-10-CM | POA: Diagnosis not present

## 2023-12-20 DIAGNOSIS — R748 Abnormal levels of other serum enzymes: Secondary | ICD-10-CM

## 2023-12-20 LAB — URINALYSIS, ROUTINE W REFLEX MICROSCOPIC
Bilirubin Urine: NEGATIVE
Glucose, UA: NEGATIVE mg/dL
Ketones, ur: NEGATIVE mg/dL
Nitrite: NEGATIVE
Protein, ur: NEGATIVE mg/dL
Specific Gravity, Urine: 1.002 — ABNORMAL LOW (ref 1.005–1.030)
pH: 7 (ref 5.0–8.0)

## 2023-12-20 LAB — CBC WITH DIFFERENTIAL/PLATELET
Abs Immature Granulocytes: 0.03 10*3/uL (ref 0.00–0.07)
Basophils Absolute: 0 10*3/uL (ref 0.0–0.1)
Basophils Relative: 1 %
Eosinophils Absolute: 0.2 10*3/uL (ref 0.0–0.5)
Eosinophils Relative: 2 %
HCT: 40.7 % (ref 36.0–46.0)
Hemoglobin: 14 g/dL (ref 12.0–15.0)
Immature Granulocytes: 0 %
Lymphocytes Relative: 34 %
Lymphs Abs: 3 10*3/uL (ref 0.7–4.0)
MCH: 31 pg (ref 26.0–34.0)
MCHC: 34.4 g/dL (ref 30.0–36.0)
MCV: 90 fL (ref 80.0–100.0)
Monocytes Absolute: 0.7 10*3/uL (ref 0.1–1.0)
Monocytes Relative: 8 %
Neutro Abs: 4.8 10*3/uL (ref 1.7–7.7)
Neutrophils Relative %: 55 %
Platelets: 318 10*3/uL (ref 150–400)
RBC: 4.52 MIL/uL (ref 3.87–5.11)
RDW: 13.2 % (ref 11.5–15.5)
WBC: 8.7 10*3/uL (ref 4.0–10.5)
nRBC: 0 % (ref 0.0–0.2)

## 2023-12-20 LAB — COMPREHENSIVE METABOLIC PANEL WITH GFR
ALT: 22 U/L (ref 0–44)
AST: 27 U/L (ref 15–41)
Albumin: 3.9 g/dL (ref 3.5–5.0)
Alkaline Phosphatase: 91 U/L (ref 38–126)
Anion gap: 13 (ref 5–15)
BUN: 15 mg/dL (ref 8–23)
CO2: 22 mmol/L (ref 22–32)
Calcium: 9.5 mg/dL (ref 8.9–10.3)
Chloride: 101 mmol/L (ref 98–111)
Creatinine, Ser: 0.61 mg/dL (ref 0.44–1.00)
GFR, Estimated: >60 mL/min (ref >60)
Glucose, Bld: 106 mg/dL — ABNORMAL HIGH (ref 70–99)
Potassium: 4 mmol/L (ref 3.5–5.1)
Sodium: 136 mmol/L (ref 135–145)
Total Bilirubin: 0.5 mg/dL (ref 0.0–1.2)
Total Protein: 7.1 g/dL (ref 6.5–8.1)

## 2023-12-20 LAB — TROPONIN I (HIGH SENSITIVITY)
Troponin I (High Sensitivity): 3 ng/L (ref <18)
Troponin I (High Sensitivity): 3 ng/L (ref <18)

## 2023-12-20 LAB — LIPASE, BLOOD: Lipase: 53 U/L — ABNORMAL HIGH (ref 11–51)

## 2023-12-20 NOTE — Telephone Encounter (Signed)
 Chief Complaint: abdominal pain Symptoms: abd pain, bloating, constipation, intermittent CP and SOB Frequency: 1.5 months of abdominal pain, CP and SOB for 2-3 months Pertinent Negatives: Patient denies fever, CP or SOB at this time, vomiting  Disposition: [x] ED /[] Urgent Care (no appt availability in office) / [] Appointment(In office/virtual)/ []  [facility]/ [] Home Care/ [] Refused Recommended Disposition /[] [facility]/ []  Follow-up with PCP Additional Notes: RN spoke to pt's daughter Harriett Sine. Harriett Sine is currently with the pt. Harriett Sine reports pt has had abd pain for 1.5 months with nausea and constipation. Pt reports she feels bloated. Additionally, pt reports intermittent CP, back pain, and difficulty breathing that occurs 2x/week. Those symptoms have been present for 2-3 months. Pt has a pacemaker. Denies CP or difficulty breathing at this time. RN advised daughter the pt needs to go to the ED. Daughter agreeable to take her, states they will leave now. RN advised daughter if pt's CP or SOB returns or she begins vomiting or experiencing dizziness or weakness she needs to call 911. Daughter verbalized understanding.    Copied from CRM [id number]. Topic: Clinical - Red Word Triage >> Dec 20, 2023  3:51 PM Abundio Miu S wrote: [facility] that prompted transfer to Nurse Triage: Stomach pain 8/10 for several days Reason for Disposition  [1] SEVERE pain (e.g., excruciating) AND [2] present > 1 hour  Answer Assessment - Initial Assessment Questions 1. LOCATION: "Where does it hurt?"      "Whole stomach" 2. RADIATION: "Does the pain shoot anywhere else?" (e.g., chest, back)     "Pain is all over her stomach and she has trouble breathing, pain in her chest" 3. ONSET: "When did the pain begin?" (e.g., minutes, hours or days ago)      Month and a half, daughter states pt has seen Dr. [place] for this a couple times 4. SUDDEN: "Gradual or sudden onset?"      5. PATTERN "Does the pain come and  go, or is it constant?"    - If it comes and goes: "How long does it last?" "Do you have pain now?"     (Note: Comes and goes means the pain is intermittent. It goes away completely between bouts.)    - If constant: "Is it getting better, staying the same, or getting worse?"      (Note: Constant means the pain never goes away completely; most serious pain is constant and gets worse.)      Comes and goes, "when it comes the pain is so strong I have to stop and press on my stomach or hold my stomach" 6. SEVERITY: "How bad is the pain?"  (e.g., Scale 1-10; mild, moderate, or severe)    - MILD (1-3): Doesn't interfere with normal activities, abdomen soft and not tender to touch.     - MODERATE (4-7): Interferes with normal activities or awakens from sleep, abdomen tender to touch.     - SEVERE (8-10): Excruciating pain, doubled over, unable to do any normal activities.       8/10 7. RECURRENT SYMPTOM: "Have you ever had this type of stomach pain before?" If Yes, ask: "When was the last time?" and "What happened that time?"      Yes, for 1.5 months 8. CAUSE: "What do you think is causing the stomach pain?"     N/A 9. RELIEVING/AGGRAVATING FACTORS: "What makes it better or worse?" (e.g., antacids, bending or twisting motion, bowel movement)      10. OTHER SYMPTOMS: "Do you  have any other symptoms?" (e.g., back pain, diarrhea, fever, urination pain, vomiting)       Trouble breathing, pain in her chest and back - pt states these occur separate from abdominal pain. Trouble breathing and CP for 2-3 months. Pt states he has chest pain, back pain, and SOB 2x/week.  Pt endorses she has discussed those symptoms with Dr. [place]. Hx of htn and complete AV block, daughter states pt has a cardiology appt this Friday. Pt denies SOB and CP at this time, only endorses abd pain.  Pt endorses nausea, no vomiting. Pt endorses when she tries to use the bathroom she only produces a small amount of stool, pt states this  is a new problem. Pt endorses when she started having abdominal pain 1.5 months ago, she had diarrhea, now she is constipated. Pt states her stomach is very bloated.  Protocols used: Abdominal Pain - Female-A-AH

## 2023-12-20 NOTE — ED Provider Triage Note (Signed)
 Emergency Medicine Provider Triage Evaluation Note  Tiffany Berg , a 68 y.o. female  was evaluated in triage.  Pt complains of abd pain.  Review of Systems  Positive: Abd, n Negative: fever  Physical Exam  BP (!) 142/97   Pulse 85   Temp 98.3 F (36.8 C) (Oral)   Resp 18   SpO2 97%  Gen:   Awake, no distress   Resp:  Normal effort  MSK:   Moves extremities without difficulty  Other:    Medical Decision Making  Medically screening exam initiated at 6:38 PM.  Appropriate orders placed.  Tiffany Berg was informed that the remainder of the evaluation will be completed by another provider, this initial triage assessment does not replace that evaluation, and the importance of remaining in the ED until their evaluation is complete.  Patient with past medical history of hypertension, dyspepsia, complete heart block, hypothyroidism, hyperlipidemia presenting to emergency room with 1-1/2 months of abdominal pain worse with eating. Seen by GI and told to come here with continued pain. Dose admit to constipation.    Tiffany Heron, PA-C 12/20/23 1840

## 2023-12-20 NOTE — ED Triage Notes (Signed)
 Pt is here for generalized abdominal pain x 1.5 months.  Pt describes the pain as severe and she has been seen by her PCP for this x2 and she was told that she would have to follow up with GI if she had continued abdominal pain.  Pt has had diarrhea but most of the time she has abdominal distention and hard small stools.

## 2023-12-21 ENCOUNTER — Encounter: Payer: Self-pay | Admitting: Pulmonary Disease

## 2023-12-21 ENCOUNTER — Encounter (HOSPITAL_COMMUNITY): Payer: Self-pay

## 2023-12-21 ENCOUNTER — Ambulatory Visit: Attending: Cardiology | Admitting: Pulmonary Disease

## 2023-12-21 ENCOUNTER — Emergency Department (HOSPITAL_COMMUNITY)

## 2023-12-21 VITALS — BP 120/72 | HR 68 | Ht 63.0 in | Wt 146.0 lb

## 2023-12-21 DIAGNOSIS — Z950 Presence of cardiac pacemaker: Secondary | ICD-10-CM

## 2023-12-21 DIAGNOSIS — I441 Atrioventricular block, second degree: Secondary | ICD-10-CM

## 2023-12-21 DIAGNOSIS — K5730 Diverticulosis of large intestine without perforation or abscess without bleeding: Secondary | ICD-10-CM | POA: Diagnosis not present

## 2023-12-21 DIAGNOSIS — K8020 Calculus of gallbladder without cholecystitis without obstruction: Secondary | ICD-10-CM | POA: Diagnosis not present

## 2023-12-21 DIAGNOSIS — K5792 Diverticulitis of intestine, part unspecified, without perforation or abscess without bleeding: Secondary | ICD-10-CM | POA: Diagnosis not present

## 2023-12-21 DIAGNOSIS — I10 Essential (primary) hypertension: Secondary | ICD-10-CM

## 2023-12-21 DIAGNOSIS — R072 Precordial pain: Secondary | ICD-10-CM

## 2023-12-21 MED ORDER — METRONIDAZOLE 500 MG PO TABS: 500.0000 mg | ORAL_TABLET | Freq: Three times a day (TID) | ORAL | 0 refills | Status: DC

## 2023-12-21 MED ORDER — ONDANSETRON 4 MG PO TBDP: 4.0000 mg | ORAL_TABLET | Freq: Three times a day (TID) | ORAL | 0 refills | Status: DC | PRN

## 2023-12-21 MED ORDER — METRONIDAZOLE 500 MG PO TABS
500.0000 mg | ORAL_TABLET | Freq: Once | ORAL | Status: AC
  Administered 2023-12-21: 500 mg via ORAL
  Filled 2023-12-21: qty 1

## 2023-12-21 MED ORDER — IOHEXOL 350 MG/ML SOLN
75.0000 mL | Freq: Once | INTRAVENOUS | Status: AC | PRN
  Administered 2023-12-21: 75 mL via INTRAVENOUS

## 2023-12-21 MED ORDER — CIPROFLOXACIN HCL 500 MG PO TABS: 500.0000 mg | ORAL_TABLET | Freq: Two times a day (BID) | ORAL | 0 refills | Status: DC

## 2023-12-21 MED ORDER — CIPROFLOXACIN HCL 500 MG PO TABS
500.0000 mg | ORAL_TABLET | Freq: Once | ORAL | Status: AC
  Administered 2023-12-21: 500 mg via ORAL
  Filled 2023-12-21: qty 1

## 2023-12-21 NOTE — Patient Instructions (Signed)
 Medication Instructions:  Your physician recommends that you continue on your current medications as directed. Please refer to the Current Medication list given to you today.  *If you need a refill on your cardiac medications before your next appointment, please call your pharmacy*  Lab Work: None ordered If you have labs (blood work) drawn today and your tests are completely normal, you will receive your results only by: MyChart Message (if you have MyChart) OR A paper copy in the mail If you have any lab test that is abnormal or we need to change your treatment, we will call you to review the results.  Testing/Procedures: See CT letter  Follow-Up: At [facility], you and your health needs are our priority.  As part of our continuing mission to provide you with exceptional heart care, our providers are all part of one team.  This team includes your primary Cardiologist (physician) and Advanced Practice Providers or APPs (Physician Assistants and Nurse Practitioners) who all work together to provide you with the care you need, when you need it.  Your next appointment:   6 month(s)  Provider:   Daphne Barrack, NP       1st Floor: - Lobby - Registration  - Pharmacy  - Lab - Cafe  2nd Floor: - PV Lab - Diagnostic Testing (echo, CT, nuclear med)  3rd Floor: - Vacant  4th Floor: - TCTS (cardiothoracic surgery) - AFib Clinic - Structural Heart Clinic - Vascular Surgery  - Vascular Ultrasound  5th Floor: - HeartCare Cardiology (general and EP) - Clinical Pharmacy for coumadin, hypertension, lipid, weight-loss medications, and med management appointments    Valet parking services will be available as well.

## 2023-12-21 NOTE — ED Provider Notes (Signed)
 [place] [facility] Provider Note   CSN: [id number] Arrival date & time: 12/20/23  1740     History  Chief Complaint  Patient presents with   Abdominal Pain    Tiffany Berg is a 68 y.o. female.  The history is provided by the patient. A language interpreter was used.  Abdominal Pain She has history of hypertension, hyperlipidemia, second-degree heart block status post pacemaker insertion and comes in because of generalized abdominal pain for approximately the last 6 weeks.  She has had some nausea without vomiting.  She had diarrhea on Monday only.  She denies fever or chills.   Home Medications Prior to Admission medications   Medication Sig Start Date End Date Taking? Authorizing Provider  aspirin  EC 81 MG tablet Take 81 mg by mouth daily. Swallow whole.    [provider]  B Complex Vitamins (VITAMIN B COMPLEX PO) Take 1 tablet by mouth daily.    [provider]  Cyanocobalamin (VITAMIN B 12) 100 MCG LOZG Take by mouth.    [provider]  doxylamine, Sleep, (UNISOM) 25 MG tablet Take 25 mg by mouth at bedtime as needed. As need    [provider]  fluticasone  (FLONASE ) 50 MCG/ACT nasal spray Place 1 spray into both nostrils 2 (two) times daily. 11/07/23   [place], Betty G, MD  levothyroxine  (SYNTHROID ) 75 MCG tablet Take 1 tablet (75 mcg total) by mouth daily before breakfast. 06/01/23   [place], Betty G, MD  losartan  (COZAAR ) 50 MG tablet Take 1 tablet (50 mg total) by mouth daily. 11/20/23   Thomasena Fleming, NP  metoprolol  tartrate (LOPRESSOR ) 25 MG tablet Take 1 tablet (25 mg total) by mouth once for 1 dose. Take 2 hours prior to CT 11/20/23 11/20/23  Creighton Doffing L, NP  Omega 3 1000 MG CAPS Take 1,000 mg by mouth daily.    [provider]  omeprazole  (PRILOSEC) 40 MG capsule Take 1 capsule (40 mg total) by mouth daily. 11/07/23   [place], Betty G, MD  OVER THE COUNTER MEDICATION Vitamin D 3 1000 mg     [provider]  rosuvastatin  (CRESTOR ) 10 MG tablet Take 1 tablet (10 mg total) by mouth daily. 06/04/23   [place], Betty G, MD      Allergies    Patient has no known allergies.    Review of Systems   Review of Systems  Gastrointestinal:  Positive for abdominal pain.  All other systems reviewed and are negative.   Physical Exam Updated Vital Signs BP (!) 141/78 (BP Location: Left Arm)   Pulse 60   Temp 97.6 F (36.4 C) (Oral)   Resp 16   SpO2 97%  Physical Exam Vitals and nursing note reviewed.   68 year old female, resting comfortably and in no acute distress. Vital signs are significant for borderline elevated blood pressure. Oxygen saturation is 97%, which is normal. Head is normocephalic and atraumatic. PERRLA, EOMI.  Back is nontender and there is no CVA tenderness. Lungs are clear without rales, wheezes, or rhonchi. Chest is nontender. Heart has regular rate and rhythm without murmur. Abdomen is soft, flat, with mild epigastric tenderness.  There is no rebound or guarding. Extremities have no cyanosis or edema, full range of motion is present. Skin is warm and dry without rash. Neurologic: Mental status is normal, moves all extremities equally.  ED Results / Procedures / Treatments   Labs (all labs ordered are listed, but only abnormal  results are displayed) Labs Reviewed  COMPREHENSIVE METABOLIC PANEL WITH GFR - Abnormal; Notable for the following components:      Result Value   Glucose, Bld 106 (*)    All other components within normal limits  LIPASE, BLOOD - Abnormal; Notable for the following components:   Lipase 53 (*)    All other components within normal limits  URINALYSIS, ROUTINE W REFLEX MICROSCOPIC - Abnormal; Notable for the following components:   APPearance HAZY (*)    Specific Gravity, Urine 1.002 (*)    Hgb urine dipstick SMALL (*)    Leukocytes,Ua LARGE (*)    Bacteria, UA FEW (*)    All other components within normal limits  CBC  WITH DIFFERENTIAL/PLATELET  TROPONIN I (HIGH SENSITIVITY)  TROPONIN I (HIGH SENSITIVITY)    EKG EKG Interpretation Date/Time:  Friday December 21 2023 05:31:15 EDT Ventricular Rate:  60 PR Interval:  210 QRS Duration:  104 QT Interval:  445 QTC Calculation: 445 R Axis:   -30  Text Interpretation: ATRIAL PACED RHYTHM Inferior infarct, old Anteroseptal infarct, age indeterminate When compared with ECG of 11/20/2023, No significant change was found Confirmed by Alissa April ([id number]) on 12/21/2023 5:45:29 AM  Radiology No results found.  Procedures Procedures    Medications Ordered in ED Medications - No data to display  ED Course/ Medical Decision Making/ A&P                                 Medical Decision Making Risk Prescription drug management.   Pain with only clinical finding of epigastric tenderness.  I have reviewed her past records, and she had been evaluated in her primary care physician's office on 11/28/2023 and 12/11/2023.  Outpatient laboratory work was normal.  I have reviewed her laboratory work today, my interpretation is normal CBC including normal WBC and normal differential, mildly elevated random glucose level, borderline elevated lipase not felt to be clinically significant, urinalysis showing no evidence of UTI, no other significant findings.  I have reviewed her electrocardiogram, and my interpretation is atrial paced rhythm unchanged from prior.  She is going for CT of abdomen and pelvis.  CT scan shows diverticulitis at the junction of descending and sigmoid colon.  Bladder wall thickening suggest possible UTI although urinalysis does not show evidence of UTI.  She has cholelithiasis without cholecystitis-I do not believe this is related to her pain.  I have ordered a dose of ciprofloxacin  and metronidazole  and I am discharging her with prescriptions for both.  At home, she has been taking ondansetron  for nausea, but she has not been taking anything else for pain.   I have advised her to use over-the-counter acetaminophen  and ibuprofen  as needed.  Return precautions discussed.       Final Clinical Impression(s) / ED Diagnoses Final diagnoses:  None    Rx / DC Orders ED Discharge Orders     None         Alissa April, MD 12/21/23 [phone]

## 2023-12-21 NOTE — Progress Notes (Signed)
  Electrophysiology Office Note:   Date:  12/21/2023  ID:  Tiffany Berg, DOB [birth year], MRN [id number]  Primary Cardiologist: Oneil Parchment, MD Primary Heart Failure: None Electrophysiologist: Will Gladis Norton, MD      History of Present Illness:    Spanish interpreter utilized for visit  Tiffany Berg is a 68 y.o. 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   Since last being seen in our clinic the patient reports she feels much better in regards to her prior symptoms of headache, dizziness, and chest pressure. She states this .  she denies chest pain, palpitations, dyspnea, PND, orthopnea, nausea, vomiting, dizziness, syncope, edema, weight gain, or early satiety.   Review of systems complete and found to be negative unless listed in HPI.   EP Information / Studies Reviewed:    EKG is not ordered today. EKG from 11/20/23 reviewed which showed AV dual paced rhythm with prolonged PR      Device History: Medtronic Dual Chamber PPM implanted 03/14/2020 for Second Degree AV block   Studies:  ECHO 03/2020 > LVEF 60-65%  ECHO 02/2023 > LVEF 55-60%, G1DD   Arrhythmia / AAD Second Degree AVB, 2:1           Physical Exam:   VS:  BP 120/72 (BP Location: Right Arm)   Pulse 68   Ht 5' 3 (1.6 m)   Wt 146 lb (66.2 kg)   SpO2 98%   BMI 25.86 kg/m    Wt Readings from Last 3 Encounters:  12/21/23 146 lb (66.2 kg)  12/11/23 150 lb 12.8 oz (68.4 kg)  11/28/23 153 lb 3.2 oz (69.5 kg)     GEN: Well nourished, well developed in no acute distress NECK: No JVD; No carotid bruits CARDIAC: Regular rate and rhythm, no murmurs, rubs, gallops RESPIRATORY:  Clear to auscultation without rales, wheezing or rhonchi  ABDOMEN: Soft, non-tender, non-distended EXTREMITIES:  No edema; No deformity   ASSESSMENT AND PLAN:    Second Degree AVB s/p Medtronic PPM  -Normal PPM function -see Pace Art for  report  -no programming changes   Hypertension  -well controlled on current regimen / BP control improved   -continue losartan  50mg  daily for BP control  -BMP post initiation with normal K / Cr  Chest Pressure, Shortness of Breath with Exertion Former remote smoker 3 years total  -coronary CT previously scheduled for 12/25/23  -significantly better with BP control  Hyperlipidemia  -per primary   Abd Pain  -seen in ER on 12/21/23 (same day) for abd pain, CT showed diverticulitis at the junction of descending and sigmoid colon. Bladder wall thickening suggest possible UTI although urinalysis does not show evidence of UTI.  She was discharged with cipro  and metronidazole .  Follow up with Dr. Norton or EP APP in 6 months  Signed, Daphne Barrack, NP-C, AGACNP-BC [facility] - Electrophysiology  12/21/2023, 3:50 PM

## 2023-12-21 NOTE — ED Notes (Addendum)
 Tele interpreter placed at bedside. Daughter at bedside. MD Candelaria Chaco at bedside.

## 2023-12-21 NOTE — Discharge Instructions (Addendum)
 Tome acetaminofn o ibuprofeno segn sea necesario para chief technology officer. Al combinar acetaminofn e ibuprofeno, obtendr un mayor alivio del dolor que tomando cualquiera de los woodsside medicamentos por separado.  Regrese a Naval architect, comienza a vomitar o tiene fiebre.

## 2023-12-24 ENCOUNTER — Telehealth (HOSPITAL_COMMUNITY): Payer: Self-pay | Admitting: *Deleted

## 2023-12-24 NOTE — Telephone Encounter (Signed)
 Pt was seen in ED.

## 2023-12-24 NOTE — Telephone Encounter (Signed)
 Attempted to call patient's other number (Via Spanish interpreter Argentina Kugel ID [phone]) regarding upcoming cardiac CT appointment. Left message on voicemail with name and callback number  Chase Copping RN Navigator Cardiac Imaging [facility] [phone] Office [phone] Cell

## 2023-12-24 NOTE — Telephone Encounter (Signed)
 Attempted to call patient (Via Spanish interpreter Heinz Llano ID [phone]) regarding upcoming cardiac CT appointment. Left message on voicemail with name and callback number  Chase Copping RN Navigator Cardiac Imaging [facility] [phone] Office [phone] Cell

## 2023-12-25 ENCOUNTER — Ambulatory Visit (HOSPITAL_COMMUNITY)
Admission: RE | Admit: 2023-12-25 | Discharge: 2023-12-25 | Disposition: A | Discharge status: Home / Self Care | Source: Ambulatory Visit | Attending: Pulmonary Disease | Admitting: Pulmonary Disease

## 2023-12-25 ENCOUNTER — Encounter: Payer: Self-pay | Admitting: Family Medicine

## 2023-12-25 DIAGNOSIS — R072 Precordial pain: Secondary | ICD-10-CM | POA: Insufficient documentation

## 2023-12-25 MED ORDER — IOHEXOL 350 MG/ML SOLN: 80.0000 mL | Freq: Once | INTRAVENOUS | Status: DC | PRN

## 2023-12-25 MED ORDER — NITROGLYCERIN 0.4 MG SL SUBL
SUBLINGUAL_TABLET | SUBLINGUAL | Status: AC
  Filled 2023-12-25: qty 2

## 2023-12-25 MED ORDER — NITROGLYCERIN 0.4 MG SL SUBL
0.8000 mg | SUBLINGUAL_TABLET | Freq: Once | SUBLINGUAL | Status: AC
  Administered 2023-12-25: 0.8 mg via SUBLINGUAL

## 2023-12-25 MED ORDER — IOHEXOL 350 MG/ML SOLN
180.0000 mL | Freq: Once | INTRAVENOUS | Status: AC | PRN
  Administered 2023-12-25: 180 mL via INTRAVENOUS

## 2023-12-27 NOTE — Progress Notes (Signed)
 Left msg for pt to return call.

## 2024-01-18 ENCOUNTER — Ambulatory Visit (INDEPENDENT_AMBULATORY_CARE_PROVIDER_SITE_OTHER): Payer: Medicare Other

## 2024-01-18 DIAGNOSIS — I441 Atrioventricular block, second degree: Secondary | ICD-10-CM

## 2024-01-18 LAB — CUP PACEART REMOTE DEVICE CHECK
Battery Remaining Longevity: 85 mo
Battery Voltage: 2.98 V
Brady Statistic AP VP Percent: 29.69 %
Brady Statistic AP VS Percent: 1.82 %
Brady Statistic AS VP Percent: 53.15 %
Brady Statistic AS VS Percent: 15.34 %
Brady Statistic RA Percent Paced: 31.51 %
Brady Statistic RV Percent Paced: 82.84 %
Date Time Interrogation Session: 20250515211703
Implantable Lead Connection Status: 753985
Implantable Lead Connection Status: 753985
Implantable Lead Implant Date: 20210711
Implantable Lead Implant Date: 20210711
Implantable Lead Location: 753859
Implantable Lead Location: 753860
Implantable Lead Model: 5076
Implantable Lead Model: 5076
Implantable Pulse Generator Implant Date: 20210711
Lead Channel Impedance Value: 323 Ohm
Lead Channel Impedance Value: 361 Ohm
Lead Channel Impedance Value: 361 Ohm
Lead Channel Impedance Value: 513 Ohm
Lead Channel Pacing Threshold Amplitude: 0.625 V
Lead Channel Pacing Threshold Amplitude: 0.75 V
Lead Channel Pacing Threshold Pulse Width: 0.4 ms
Lead Channel Pacing Threshold Pulse Width: 0.4 ms
Lead Channel Sensing Intrinsic Amplitude: 3.5 mV
Lead Channel Sensing Intrinsic Amplitude: 3.5 mV
Lead Channel Sensing Intrinsic Amplitude: 4.25 mV
Lead Channel Sensing Intrinsic Amplitude: 4.25 mV
Lead Channel Setting Pacing Amplitude: 1.5 V
Lead Channel Setting Pacing Amplitude: 2.5 V
Lead Channel Setting Pacing Pulse Width: 0.4 ms
Lead Channel Setting Sensing Sensitivity: 1.2 mV
Zone Setting Status: 755011
Zone Setting Status: 755011

## 2024-01-20 ENCOUNTER — Ambulatory Visit: Payer: Self-pay | Admitting: Cardiology

## 2024-01-24 NOTE — Progress Notes (Signed)
 [facility] - Clinic Note  02/05/2024   CHIEF COMPLAINT Patient presents for Retina Follow Up  HISTORY OF PRESENT ILLNESS: Tiffany Berg is a 68 y.o. female who presents to the clinic today for:  HPI     Retina Follow Up   Patient presents with  Wet AMD.  In right eye.  This started 6 months ago.  Duration of 7 weeks.  Since onset it is gradually improving.  I, the attending physician,  performed the HPI with the patient and updated documentation appropriately.        Comments   Pt states her vision seems to be better since she started coming here. Pt states she was told to wear her glasses all the time but when she takes them off she feels her eyes feel askew. Pt denies FOL/new floaters/pain. Pt is using AT's PRN.      Last edited by Ronelle Coffee, MD on 02/05/2024  5:06 PM.     Pt states her vision is good  Referring physician: Florance Hun, MD [address] [place],  [place] [address]  HISTORICAL INFORMATION:  Selected notes from the MEDICAL RECORD NUMBER Referred by Dr. Carloyn Chi for ARMD eval LEE:  Ocular Hx- PMH-   CURRENT MEDICATIONS: No current outpatient medications on file. (Ophthalmic Drugs)   No current facility-administered medications for this visit. (Ophthalmic Drugs)   Current Outpatient Medications (Other)  Medication Sig   aspirin  EC 81 MG tablet Take 81 mg by mouth daily. Swallow whole.   B Complex Vitamins (VITAMIN B COMPLEX PO) Take 1 tablet by mouth daily.   ciprofloxacin  (CIPRO ) 500 MG tablet Take 1 tablet (500 mg total) by mouth 2 (two) times daily.   Cyanocobalamin (VITAMIN B 12) 100 MCG LOZG Take by mouth.   doxylamine, Sleep, (UNISOM) 25 MG tablet Take 25 mg by mouth at bedtime as needed. As need   fluticasone  (FLONASE ) 50 MCG/ACT nasal spray Place 1 spray into both nostrils 2 (two) times daily.   levothyroxine  (SYNTHROID ) 75 MCG tablet Take 1 tablet (75 mcg total) by mouth daily before breakfast.   losartan  (COZAAR ) 50  MG tablet Take 1 tablet (50 mg total) by mouth daily.   Magnesium  200 MG TABS Take by mouth daily at 6 (six) AM.   Omega 3 1000 MG CAPS Take 1,000 mg by mouth daily.   omeprazole  (PRILOSEC) 40 MG capsule Take 1 capsule (40 mg total) by mouth daily.   ondansetron  (ZOFRAN -ODT) 4 MG disintegrating tablet Take 1 tablet (4 mg total) by mouth every 8 (eight) hours as needed for nausea or vomiting.   OVER THE COUNTER MEDICATION Vitamin D 3 1000 mg   rosuvastatin  (CRESTOR ) 10 MG tablet Take 1 tablet (10 mg total) by mouth daily.   No current facility-administered medications for this visit. (Other)   REVIEW OF SYSTEMS: ROS   Positive for: Gastrointestinal, Endocrine, Cardiovascular, Eyes Negative for: Constitutional, Neurological, Skin, Genitourinary, Musculoskeletal, HENT, Respiratory, Psychiatric, Allergic/Imm, Heme/Lymph Last edited by Carrington Clack, COT on 02/05/2024  2:02 PM.      ALLERGIES No Known Allergies  PAST MEDICAL HISTORY 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   dual chamber medtronic   cardiologist--- dr Lawana Pray---  ED 03-12-2020 presyncope, sob, some chest pain w/ exertion, found to have symptomatic bradycardia and second degree av block type 2   Ductal carcinoma in situ (DCIS) of left breast    oncologist-- dr  c. 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   w/ symptomatic bradycardia, rate 30s;  s/p  PPM 03-14-2020   Myalgia, multiple sites    Pre-diabetes    Symptomatic bradycardia 03/12/2020   s/p PPM   Wears glasses    Wears partial dentures    upper and lower   Past Surgical History:  Procedure Laterality Date   Candida Chalk  OSTEOTOMY Right 10/15/2020   Procedure: Candida Chalk OSTEOTOMY;  Surgeon: Floyce Hutching, DPM;  Location: [facility];  Service: Podiatry;  Laterality: Right;  REGIONAL BLOCK   AIKEN OSTEOTOMY Left 02/24/2022   Procedure: Candida Chalk OSTEOTOMY;  Surgeon: Floyce Hutching, DPM;  Location: [facility];  Service: Podiatry;  Laterality: Left;   ANTERIOR AND POSTERIOR REPAIR N/A 02/18/2015   Procedure: ANTERIOR (CYSTOCELE) AND POSTERIOR REPAIR (RECTOCELE);  Surgeon: Renea Carrion, MD;  Location: [facility];  Service: Gynecology;  Laterality: N/A;   BLADDER SUSPENSION N/A 02/18/2015   Procedure: TRANSVAGINAL TAPE (TVT) PROCEDURE;  Surgeon: Renea Carrion, MD;  Location: [facility];  Service: Gynecology;  Laterality: N/A;   COLONOSCOPY     pt states she might have had one many years ago   CYSTOSCOPY Bilateral 02/18/2015   Procedure: CYSTOSCOPY;  Surgeon: Renea Carrion, MD;  Location: [facility];  Service: Gynecology;  Laterality: Bilateral;   GRAFT APPLICATION Left 02/24/2022   Procedure: BONE GRAFT;  Surgeon: Floyce Hutching, DPM;  Location: [facility];  Service: Podiatry;  Laterality: Left;   HALLUX VALGUS LAPIDUS Right 10/15/2020   Procedure: HALLUX VALGUS LAPIDUS AND BONE GRAFT;  Surgeon: Floyce Hutching, DPM;  Location: [facility];  Service: Podiatry;  Laterality: Right;  WITH IV SEDATION; REGIONAL BLOCK   HALLUX VALGUS LAPIDUS Left 02/24/2022   Procedure: HALLUX VALGUS LAPIDUS;  Surgeon: Floyce Hutching, DPM;  Location: [facility];  Service: Podiatry;  Laterality: Left;   HAMMER TOE SURGERY Right 10/15/2020   Procedure: HAMMER TOE CORRECTION;  Surgeon: Floyce Hutching, DPM;  Location: [facility];  Service: Podiatry;  Laterality: Right;  REGIONAL BLOCK   HAMMER TOE SURGERY Left 02/24/2022   Procedure: HAMMER TOE CORRECTION SECOND TOE LEFT FOOT;  Surgeon: Floyce Hutching, DPM;  Location: [facility] ;   Service: Podiatry;  Laterality: Left;   LAPAROSCOPIC ASSISTED VAGINAL HYSTERECTOMY N/A 02/18/2015   Procedure: LAPAROSCOPIC ASSISTED VAGINAL HYSTERECTOMY;  Surgeon: Renea Carrion, MD;  Location: [facility];  Service: Gynecology;  Laterality: N/A;   MASTECTOMY, PARTIAL Left 08-12-2013  @[facility]    excision another site   PACEMAKER IMPLANT N/A 03/14/2020   Procedure: PACEMAKER IMPLANT;  Surgeon: Lei Pump, MD;  Location: [facility];  Service: Cardiovascular;  Laterality: N/A;   RADIOACTIVE SEED GUIDED PARTIAL MASTECTOMY/AXILLARY SENTINEL NODE BIOPSY/AXILLARY NODE DISSECTION Left 07-22-2013  @[facility]    SALPINGOOPHORECTOMY Bilateral 02/18/2015   Procedure: BILATERAL SALPINGO OOPHORECTOMY;  Surgeon: Renea Carrion, MD;  Location: [facility];  Service: Gynecology;  Laterality: Bilateral;   FAMILY HISTORY Family History  Problem Relation Age of Onset   Heart disease Mother    Colon cancer Father    Cancer Father  prostata   Stomach cancer Sister    Cancer Sister 27       Breast   Esophageal cancer Neg Hx    Rectal cancer Neg Hx    SOCIAL HISTORY Social History   Tobacco Use   Smoking status: Never   Smokeless tobacco: Never  Vaping Use   Vaping status: Never Used  Substance Use Topics   Alcohol use: Yes    Comment: socially   Drug use: Never       OPHTHALMIC EXAM:  Base Eye Exam     Visual Acuity (Snellen - Linear)       Right Left   Dist [place] 20/20 -2 20/20 -1    Correction: Glasses         Tonometry (Tonopen, 2:08 PM)       Right Left   Pressure 16 17         Pupils       Pupils Dark Light Shape React APD   Right PERRL 3 2 Round Brisk None   Left PERRL 3 2 Round Brisk None         Visual Fields       Left Right    Full Full         Extraocular Movement       Right Left    Full, Ortho Full, Ortho         Neuro/Psych     Oriented x3: Yes   Mood/Affect: Normal         Dilation     Both eyes: 1.0% Mydriacyl, 2.5%  Phenylephrine  @ 2:08 PM           Slit Lamp and Fundus Exam     Slit Lamp Exam       Right Left   Lids/Lashes Dermatochalasis - upper lid Dermatochalasis - upper lid   Conjunctiva/Sclera mild nasal pterygium mild nasal pterygium   Cornea nasal pterygium, trace tear film debris nasal pterygium, trace tear film debris, 1+ PEE   Anterior Chamber deep and clear deep and clear   Iris Round and dilated, No NVI Round and dilated, No NVI   Lens 2+ Nuclear sclerosis, 2-3+ Cortical cataract 2+ Nuclear sclerosis, 2+ Cortical cataract   Anterior Vitreous mild syneresis mild syneresis         Fundus Exam       Right Left   Disc Pink and Sharp, Compact Pink and Sharp, Compact   C/D Ratio 0.4 0.4   Macula Flat, good foveal reflex, focal CNV with shallow SRF and edema ST macula -- stably improved, mild drusen, no frank heme Flat, Good foveal reflex, fine drusen, No heme or edema   Vessels attenuated, mild tortuosity attenuated, mild tortuosity   Periphery Attached, No heme Attached, No heme           Refraction     Wearing Rx       Sphere Cylinder Axis Add   Right -0.50 +0.75 114 +2.25   Left -0.50 +2.50 066 +2.25           IMAGING AND PROCEDURES  Imaging and Procedures for 02/05/2024  OCT, Retina - OU - Both Eyes        Right Eye Quality was good. Central Foveal Thickness: 251. Progression has been stable. Findings include normal foveal contour, no IRF, no SRF, retinal drusen , subretinal hyper-reflective material, pigment epithelial detachment (Focal PED with stable improvement in shallow SRF ST to fovea, partial PVD ).  Left Eye Quality was good. Central Foveal Thickness: 250. Progression has been stable. Findings include normal foveal contour, no IRF, no SRF, retinal drusen .   Notes  *Images captured and stored on drive  Diagnosis / Impression:  OD: Focal PED/CNV with stable improvement in shallow SRF ST to fovea, partial PVD OS: NFP, no IRF/SRF  Clinical  management:  See below  Abbreviations: NFP - Normal foveal profile. CME - cystoid macular edema. PED - pigment epithelial detachment. IRF - intraretinal fluid. SRF - subretinal fluid. EZ - ellipsoid zone. ERM - epiretinal membrane. ORA - outer retinal atrophy. ORT - outer retinal tubulation. SRHM - subretinal hyper-reflective material. IRHM - intraretinal hyper-reflective material      Intravitreal Injection, Pharmacologic Agent - OD - Right Eye       Time Out 02/05/2024. 3:21 PM. Confirmed correct patient, procedure, site, and patient consented.   Anesthesia Topical anesthesia was used. Anesthetic medications included Lidocaine  2%, Proparacaine 0.5%.   Procedure Preparation included 5% betadine to ocular surface, eyelid speculum. A supplied needle was used.   Injection: 1.25 mg Bevacizumab  1.25mg /0.23ml   Route: Intravitreal, Site: Right Eye   NDC: [id number], Lot: [id number], Expiration date: 03/14/2024   Post-op Post injection exam found visual acuity of at least counting fingers. The patient tolerated the procedure well. There were no complications. The patient received written and verbal post procedure care education.            ASSESSMENT/PLAN:   ICD-10-CM   1. Exudative age-related macular degeneration of right eye with active choroidal neovascularization (HCC)  H35.3211 OCT, Retina - OU - Both Eyes    Intravitreal Injection, Pharmacologic Agent - OD - Right Eye    Bevacizumab  (AVASTIN ) SOLN 1.25 mg    2. Intermediate stage nonexudative age-related macular degeneration of left eye  H35.3122     3. Diabetes mellitus type 2 without retinopathy (HCC)  E11.9     4. Essential hypertension  I10     5. Hypertensive retinopathy of both eyes  H35.033     6. Combined forms of age-related cataract of both eyes  H25.813       Exudative age related macular degeneration OD  - s/p IVA OD #1 (01.14.25), #2 (02.11.25), #3 (03.11.25), #4 (04.15.25)  - FA (01.14.24) shows foval CNV  with leakage superior fovea  - BCVA OD 20/25 -- stable  - OCT shows focal PED/CNV with stable improvement in shallow SRF ST to fovea at 7 wks  - recommend IVA OD #5 today, 06.03.25 w/ f/u ext to 9 wks  - pt wishes to be treated with IVA  - RBA of procedure discussed, questions answered - IVA informed consent obtained and signed, 01.14.25 (OD) - see procedure note - f/u in 9 wks, DFE, OCT, possible injxn  2. Age related macular degeneration, non-exudative, OS  - The incidence, anatomy, and pathology of dry AMD, risk of progression, and the AREDS and AREDS 2 study including smoking risks discussed with patient.  - Recommend amsler grid monitoring  - monitor  3. Diabetes mellitus, type 2 without retinopathy  - A1c: 6.4 on 09.27.24 - The incidence, risk factors for progression, natural history and treatment options for diabetic retinopathy  were discussed with patient.   - The need for close monitoring of blood glucose, blood pressure, and serum lipids, avoiding cigarette or any type of tobacco, and the need for long term follow up was also discussed with patient. - f/u in 1 year, sooner prn  4,5.  Hypertensive retinopathy OU - discussed importance of tight BP control - monitor  6. Mixed Cataract OU - The symptoms of cataract, surgical options, and treatments and risks were discussed with patient. - discussed diagnosis and progression - monitor   Ophthalmic Meds Ordered this visit:  Meds ordered this encounter  Medications   Bevacizumab  (AVASTIN ) SOLN 1.25 mg     Return in about 9 weeks (around 04/08/2024) for f/u exu ARMD OD, DFE, OCT, Possible Injxn.  There are no Patient Instructions on file for this visit.  Explained the diagnoses, plan, and follow up with the patient and they expressed understanding.  Patient expressed understanding of the importance of proper follow up care.   This document serves as a record of services personally performed by Jeanice Millard, MD, PhD. It  was created on their behalf by Olene Berne, COT an ophthalmic technician. The creation of this record is the provider's dictation and/or activities during the visit.    Electronically signed by:  Olene Berne, COT  02/05/24 5:07 PM  This document serves as a record of services personally performed by Jeanice Millard, MD, PhD. It was created on their behalf by Morley Arabia. Bevin Bucks, OA an ophthalmic technician. The creation of this record is the provider's dictation and/or activities during the visit.    Electronically signed by: Morley Arabia. Bevin Bucks, OA 02/05/24 5:07 PM  Jeanice Millard, M.D., Ph.D. Diseases & Surgery of the Retina and Vitreous [facility] 02/05/2024   I have reviewed the above documentation for accuracy and completeness, and I agree with the above. Jeanice Millard, M.D., Ph.D. 02/05/24 5:08 PM   Abbreviations: M myopia (nearsighted); A astigmatism; H hyperopia (farsighted); P presbyopia; Mrx spectacle prescription;  CTL contact lenses; OD right eye; OS left eye; OU both eyes  XT exotropia; ET esotropia; PEK punctate epithelial keratitis; PEE punctate epithelial erosions; DES dry eye syndrome; MGD meibomian gland dysfunction; ATs artificial tears; PFAT's preservative free artificial tears; NSC nuclear sclerotic cataract; PSC posterior subcapsular cataract; ERM epi-retinal membrane; PVD posterior vitreous detachment; RD retinal detachment; DM diabetes mellitus; DR diabetic retinopathy; NPDR non-proliferative diabetic retinopathy; PDR proliferative diabetic retinopathy; CSME clinically significant macular edema; DME diabetic macular edema; dbh dot blot hemorrhages; CWS cotton wool spot; POAG primary open angle glaucoma; C/D cup-to-disc ratio; HVF humphrey visual field; GVF goldmann visual field; OCT optical coherence tomography; IOP intraocular pressure; BRVO Branch retinal vein occlusion; CRVO central retinal vein occlusion; CRAO central retinal artery  occlusion; BRAO branch retinal artery occlusion; RT retinal tear; SB scleral buckle; PPV pars plana vitrectomy; VH Vitreous hemorrhage; PRP panretinal laser photocoagulation; IVK intravitreal kenalog; VMT vitreomacular traction; MH Macular hole;  NVD neovascularization of the disc; NVE neovascularization elsewhere; AREDS age related eye disease study; ARMD age related macular degeneration; POAG primary open angle glaucoma; EBMD epithelial/anterior basement membrane dystrophy; ACIOL anterior chamber intraocular lens; IOL intraocular lens; PCIOL posterior chamber intraocular lens; Phaco/IOL phacoemulsification with intraocular lens placement; PRK photorefractive keratectomy; LASIK laser assisted in situ keratomileusis; HTN hypertension; DM diabetes mellitus; COPD chronic obstructive pulmonary disease

## 2024-02-05 ENCOUNTER — Ambulatory Visit (INDEPENDENT_AMBULATORY_CARE_PROVIDER_SITE_OTHER): Admitting: Ophthalmology

## 2024-02-05 ENCOUNTER — Encounter (INDEPENDENT_AMBULATORY_CARE_PROVIDER_SITE_OTHER): Payer: Self-pay | Admitting: Ophthalmology

## 2024-02-05 DIAGNOSIS — H35033 Hypertensive retinopathy, bilateral: Secondary | ICD-10-CM

## 2024-02-05 DIAGNOSIS — H353211 Exudative age-related macular degeneration, right eye, with active choroidal neovascularization: Secondary | ICD-10-CM

## 2024-02-05 DIAGNOSIS — I10 Essential (primary) hypertension: Secondary | ICD-10-CM

## 2024-02-05 DIAGNOSIS — H353122 Nonexudative age-related macular degeneration, left eye, intermediate dry stage: Secondary | ICD-10-CM

## 2024-02-05 DIAGNOSIS — E119 Type 2 diabetes mellitus without complications: Secondary | ICD-10-CM | POA: Diagnosis not present

## 2024-02-05 DIAGNOSIS — H25813 Combined forms of age-related cataract, bilateral: Secondary | ICD-10-CM

## 2024-02-05 MED ORDER — BEVACIZUMAB CHEMO INJECTION 1.25MG/0.05ML SYRINGE FOR KALEIDOSCOPE
1.2500 mg | INTRAVITREAL | Status: AC | PRN
Start: 2024-02-05 — End: 2024-02-05
  Administered 2024-02-05: 1.25 mg via INTRAVITREAL

## 2024-02-06 ENCOUNTER — Ambulatory Visit: Admitting: Family Medicine

## 2024-02-06 ENCOUNTER — Encounter: Payer: Self-pay | Admitting: Family Medicine

## 2024-02-06 VITALS — BP 126/80 | HR 88 | Temp 97.8°F | Resp 16 | Ht 63.0 in | Wt 151.4 lb

## 2024-02-06 DIAGNOSIS — G4700 Insomnia, unspecified: Secondary | ICD-10-CM

## 2024-02-06 DIAGNOSIS — R42 Dizziness and giddiness: Secondary | ICD-10-CM

## 2024-02-06 DIAGNOSIS — F419 Anxiety disorder, unspecified: Secondary | ICD-10-CM | POA: Diagnosis not present

## 2024-02-06 DIAGNOSIS — R251 Tremor, unspecified: Secondary | ICD-10-CM

## 2024-02-06 DIAGNOSIS — J069 Acute upper respiratory infection, unspecified: Secondary | ICD-10-CM | POA: Diagnosis not present

## 2024-02-06 LAB — POC COVID19 BINAXNOW: SARS Coronavirus 2 Ag: NEGATIVE

## 2024-02-06 MED ORDER — TRAZODONE HCL 50 MG PO TABS: 25.0000 mg | ORAL_TABLET | Freq: Every day | ORAL | 1 refills | Status: DC

## 2024-02-06 NOTE — Progress Notes (Addendum)
 ACUTE VISIT Chief Complaint  Patient presents with   Sore Throat   Dizziness   HPI: Tiffany Berg is a 68 y.o. female with a PMHx significant for Mobitz II s/p pacemaker placement, HTN, atherosclerosis of aorta, hypothyroidism, pelvic prolapse, HLD, and prediabetes, who is here today complaining of dizziness and sore throat.   Patient complains of sore throat for 2 days. Endorses some associated body aches, chest tightness, and minimal nasal congestion and rhinorrhea.  Sore Throat  This is a new problem. The current episode started in the past 7 days. The problem has been unchanged. There has been no fever. Pertinent negatives include no abdominal pain, drooling, ear discharge, ear pain, hoarse voice, plugged ear sensation, shortness of breath, stridor, trouble swallowing or vomiting. She has had no exposure to strep or mono. She has tried acetaminophen  for the symptoms. The treatment provided mild relief.   Reports occasional headache, no more than usual. She has been taking tylenol , and occasionally ibuprofen  if she has headaches.  Has not checked for covid at home.   Pertinent negatives include fever, or cough.   Mentions chest tightness, exacerbated by heat. Mentions that at work, in the kitchen, there is no an AC, so symptom is more frequent there.  Tremors in her hands:  She also complains of intermittent hand "shaking" for about 3 days.  Seems to be worse with stress.  She denies heavy alcohol consumption, she drinks alcohol socially in rare occasions.  No known FMHx of tremor.   Dizziness:  She also complains of intermittent episodes of dizziness, specially when she bends over.  She has had this problem for some time. No associated symptoms.  Sleeping difficulty:  She mentions she has had some recent difficulty sleeping.  This problem has been going on for over 3 months. Sleeping about 4 hours. OTC Unisom not always helps, even taking 2 tabs at the time. Also  endorses some recent anxiety.  She states that in the past she took zolpidem .      02/06/2024    4:01 PM 06/01/2023    7:31 AM 10/18/2022    3:12 PM 08/30/2022    4:32 PM 02/22/2022    3:04 PM  Depression screen PHQ 2/9  Decreased Interest 2 1 0 0 0  Down, Depressed, Hopeless 2 1 0 0 0  PHQ - 2 Score 4 2 0 0 0  Altered sleeping 3 0     Tired, decreased energy 2 3     Change in appetite 2 0     Feeling bad or failure about yourself  0 1     Trouble concentrating 1 1     Moving slowly or fidgety/restless 1 0     Suicidal thoughts 0 0     PHQ-9 Score 13 7     Difficult doing work/chores Somewhat difficult Not difficult at all         02/06/2024    4:01 PM 06/01/2023    7:32 AM  GAD 7 : Generalized Anxiety Score  Nervous, Anxious, on Edge 2 1  Control/stop worrying 1 2  Worry too much - different things 1 2  Trouble relaxing 1 0  Restless 1 0  Easily annoyed or irritable 0 2  Afraid - awful might happen 0 0  Total GAD 7 Score 6 7  Anxiety Difficulty Not difficult at all Not difficult at all   Review of Systems  Constitutional:  Positive for activity change and fatigue. Negative for appetite  change.  HENT:  Positive for postnasal drip and rhinorrhea. Negative for drooling, ear discharge, ear pain, hoarse voice and trouble swallowing.   Eyes:  Negative for discharge and redness.  Respiratory:  Negative for shortness of breath and stridor.   Gastrointestinal:  Negative for abdominal pain, nausea and vomiting.  Endocrine: Negative for cold intolerance and heat intolerance.  Genitourinary:  Negative for decreased urine volume, dysuria and hematuria.  Skin:  Negative for rash.  Neurological:  Negative for syncope and facial asymmetry.  Psychiatric/Behavioral:  Positive for sleep disturbance. Negative for confusion and hallucinations. The patient is nervous/anxious.   See other pertinent positives and negatives in HPI.  Current Outpatient Medications on File Prior to Visit   Medication Sig Dispense Refill   aspirin  EC 81 MG tablet Take 81 mg by mouth daily. Swallow whole.     B Complex Vitamins (VITAMIN B COMPLEX PO) Take 1 tablet by mouth daily.     Cyanocobalamin (VITAMIN B 12) 100 MCG LOZG Take by mouth.     doxylamine, Sleep, (UNISOM) 25 MG tablet Take 25 mg by mouth at bedtime as needed. As need     fluticasone  (FLONASE ) 50 MCG/ACT nasal spray Place 1 spray into both nostrils 2 (two) times daily. 16 g 0   levothyroxine  (SYNTHROID ) 75 MCG tablet Take 1 tablet (75 mcg total) by mouth daily before breakfast. 90 tablet 3   losartan  (COZAAR ) 50 MG tablet Take 1 tablet (50 mg total) by mouth daily. 30 tablet 11   Magnesium  200 MG TABS Take by mouth daily at 6 (six) AM.     Omega 3 1000 MG CAPS Take 1,000 mg by mouth daily.     omeprazole  (PRILOSEC) 40 MG capsule Take 1 capsule (40 mg total) by mouth daily. 60 capsule 0   OVER THE COUNTER MEDICATION Vitamin D 3 1000 mg     rosuvastatin  (CRESTOR ) 10 MG tablet Take 1 tablet (10 mg total) by mouth daily. 90 tablet 3   No current facility-administered medications on file prior to visit.   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   dual chamber medtronic   cardiologist--- dr Lawana Pray---  ED 03-12-2020 presyncope, sob, some chest pain w/ exertion, found to have symptomatic bradycardia and second degree av block type 2   Ductal carcinoma in situ (DCIS) of left breast    oncologist-- dr c. 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   w/ symptomatic bradycardia, rate 30s;  s/p  PPM 03-14-2020   Myalgia, multiple sites    Pre-diabetes    Symptomatic bradycardia 03/12/2020   s/p  PPM   Wears glasses    Wears partial dentures    upper and lower   No Known Allergies  Social History   Socioeconomic History   Marital status: Legally Separated    Spouse name: Not on file   Number of children: Not on file   Years of education: Not on file   Highest education level: Not on file  Occupational History   Not on file  Tobacco Use   Smoking status: Never   Smokeless tobacco:  Never  Vaping Use   Vaping status: Never Used  Substance and Sexual Activity   Alcohol use: Yes    Comment: socially   Drug use: Never   Sexual activity: Yes    Birth control/protection: Post-menopausal, Surgical  Other Topics Concern   Not on file  Social History Narrative   Not on file   Social Drivers of Health   Financial Resource Strain: Low Risk  (02/22/2022)   Overall Financial Resource Strain (CARDIA)    Difficulty of Paying Living Expenses: Not hard at all  Food Insecurity: No Food Insecurity (02/22/2022)   Hunger Vital Sign    Worried About Running Out of Food in the Last Year: Never true    Ran Out of Food in the Last Year: Never true  Transportation Needs: No Transportation Needs (02/22/2022)   PRAPARE - Administrator, Civil Service (Medical): No    Lack of Transportation (Non-Medical): No  Physical Activity: Insufficiently Active (02/22/2022)   Exercise Vital Sign    Days of Exercise per Week: 2 days    Minutes of Exercise per Session: 60 min  Stress: No Stress Concern Present (02/22/2022)   [employer] of Occupational Health - Occupational Stress Questionnaire    Feeling of Stress : Not at all  Social Connections: Unknown (02/22/2022)   Social Connection and Isolation Panel [NHANES]    Frequency of Communication with Friends and Family: More than three times a week    Frequency of Social Gatherings with Friends and Family: More than three times a week    Attends Religious Services: Not on file    Active Member of Clubs or Organizations: Yes     Attends Banker Meetings: More than 4 times per year    Marital Status: Separated   Vitals:   02/06/24 1511  BP: 126/80  Pulse: 88  Resp: 16  Temp: 97.8 F (36.6 C)  SpO2: 96%   Body mass index is 26.81 kg/m.  Physical Exam Vitals and nursing note reviewed.  Constitutional:      General: She is not in acute distress.    Appearance: She is well-developed.  HENT:     Head: Normocephalic and atraumatic.     Nose: Congestion and rhinorrhea present.     Mouth/Throat:     Mouth: Mucous membranes are moist. No oral lesions.     Pharynx: Oropharynx is clear. Uvula midline. Posterior oropharyngeal erythema present. No pharyngeal swelling, oropharyngeal exudate or uvula swelling.  Eyes:     Conjunctiva/sclera: Conjunctivae normal.  Cardiovascular:     Rate and Rhythm: Regular rhythm.     Heart sounds: No murmur heard. Pulmonary:     Effort: Pulmonary effort is normal. No respiratory distress.     Breath sounds: Normal breath sounds.  Abdominal:     Palpations: Abdomen is soft. There is no mass.     Tenderness: There is no abdominal tenderness.  Musculoskeletal:     Right lower leg: No edema.     Left lower leg: No edema.  Lymphadenopathy:     Cervical: No cervical adenopathy.  Skin:    General: Skin is warm.     Findings: No erythema or rash.  Neurological:     General: No focal deficit present.     Mental Status: She is alert and oriented to person, place, and time.     Motor: No pronator drift.     Gait: Gait normal.     Comments: Minimal hand tremor  noted for a few seconds, not present at rest.  Psychiatric:        Mood and Affect: Affect normal. Mood is anxious.    ASSESSMENT AND PLAN:  Ms. Lagoy was seen today for dizziness and sore throat.   Insomnia, unspecified type Assessment & Plan: OTC medication did not help. We discussed possible causes. I do not recommend Ambien , we reviewed side effects. Trazodone  50 mg 1/2 tab daily about 30 min  before bedtime. Good sleep hygiene recommended. F/U in 2 months.  Orders: -     traZODone  HCl; Take 0.5 tablets (25 mg total) by mouth at bedtime.  Dispense: 30 tablet; Refill: 1  URI, acute Symptoms suggests a viral etiology, so symptomatic treatment recommended. Instructed to monitor for signs of complications, including new onset of fever among some, clearly instructed about warning signs. COVID 19 test negative. F/U as needed.  -     POC COVID-19 BinaxNow  Anxiety disorder, unspecified type Assessment & Plan: She agrees with trying Trazodone  50 mg, starting 1/2 tab daily at bedtime. Side effects discussed. F/U in 2 months,before if needed.  Orders: -     traZODone  HCl; Take 0.5 tablets (25 mg total) by mouth at bedtime.  Dispense: 30 tablet; Refill: 1  Tremor of both hands  Noted for a few seconds. We discussed possible causes, ? Anxiety. Monitor for new symptoms.  Dizziness  She has a long history of intermittent dizziness, we discussed possible etiologies. The examination and history do not suggest a serious process. Head CT done on 02/28/23 showed generalized cerebral atrophy with chronic white matter small vessel ischemic changes. Recently followed with cardiologist.  Return in about 2 months (around 04/07/2024).  I, Fritz Jewel Wierda, acting as a scribe for Jurnie Garritano [place], MD., have documented all relevant documentation on the behalf of Deshanti Adcox [place], MD, as directed by  Tonia Avino [place], MD while in the presence of Neka Bise [place], MD.   I, Abaigeal Moomaw [place], MD, have reviewed all documentation for this visit. The documentation on 02/06/24 for the exam, diagnosis, procedures, and orders are all accurate and complete.  Pookela Sellin G. [place], MD  [facility]. [place] office.

## 2024-02-06 NOTE — Patient Instructions (Addendum)
 A few things to remember from today's visit:  URI, acute  Insomnia, unspecified type - Plan: traZODone  (DESYREL ) 50 MG tablet  Anxiety disorder, unspecified type - Plan: traZODone  (DESYREL ) 50 MG tablet Tome Trazodone  1/2 tab 30 minutos antes de irse a la cama.  If you need refills for medications you take chronically, please call your pharmacy. Do not use My Chart to request refills or for acute issues that need immediate attention. If you send a my chart message, it may take a few days to be addressed, specially if I am not in the office.  Please be sure medication list is accurate. If a new problem present, please set up appointment sooner than planned today.

## 2024-02-06 NOTE — Assessment & Plan Note (Addendum)
 With some depression like symptoms (PHQ 13). She agrees with trying Trazodone  50 mg, starting 1/2 tab daily at bedtime. Side effects discussed. F/U in 2 months,before if needed.

## 2024-02-06 NOTE — Assessment & Plan Note (Signed)
 OTC medication did not help. We discussed possible causes. I do not recommend Ambien , we reviewed side effects. Trazodone  50 mg 1/2 tab daily about 30 min before bedtime. Good sleep hygiene recommended. F/U in 2 months.

## 2024-02-19 ENCOUNTER — Ambulatory Visit (INDEPENDENT_AMBULATORY_CARE_PROVIDER_SITE_OTHER): Admitting: Family Medicine

## 2024-02-19 ENCOUNTER — Encounter: Payer: Self-pay | Admitting: Family Medicine

## 2024-02-19 DIAGNOSIS — Z0000 Encounter for general adult medical examination without abnormal findings: Secondary | ICD-10-CM | POA: Diagnosis not present

## 2024-02-19 NOTE — Progress Notes (Signed)
 Patient was unable to self-report due to a lack of equipment at home via telehealth

## 2024-02-19 NOTE — Patient Instructions (Addendum)
 I really enjoyed getting to talk with you today! I am available on Tuesdays and Thursdays for virtual visits if you have any questions or concerns, or if I can be of any further assistance.   CHECKLIST FROM ANNUAL WELLNESS VISIT:  -Follow up (please call to schedule if not scheduled after visit):   -yearly for annual wellness visit with primary care office  Here is a list of your preventive care/health maintenance measures and the plan for each if any are due:  PLAN For any measures below that may be due:    1.) Please obtain a copy of the mammogram and bring it to Dr. [place].   2.) You can get your shingles vaccine at the pharmacy.  Health Maintenance  Topic Date Due   Diabetic kidney evaluation - Urine ACR  Never done   Zoster Vaccines- Shingrix (1 of 2) Never done   MAMMOGRAM  03/02/2021   INFLUENZA VACCINE  04/04/2024   Diabetic kidney evaluation - eGFR measurement  12/19/2024   Medicare Annual Wellness (AWV)  02/18/2025   Colonoscopy  09/02/2026   DTaP/Tdap/Td (2 - Td or Tdap) 02/27/2033   Pneumococcal Vaccine: 50+ Years  Completed   DEXA SCAN  Completed   Hepatitis C Screening  Completed   HPV VACCINES  Aged Out   Meningococcal B Vaccine  Aged Out   COVID-19 Vaccine  Discontinued    -See a dentist at least yearly  -Get your eyes checked and then per your eye specialist's recommendations  -Other issues addressed today:   -I have included below further information regarding a healthy whole foods based diet, physical activity guidelines for adults, stress management and opportunities for social connections. I hope you find this information useful.   -----------------------------------------------------------------------------------------------------------------------------------------------------------------------------------------------------------------------------------------------------------    NUTRITION: -eat real food: lots of colorful vegetables (half the  plate) and fruits -5-7 servings of vegetables and fruits per day (fresh or steamed is best), exp. 2 servings of vegetables with lunch and dinner and 2 servings of fruit per day. Berries and greens such as kale and collards are great choices.  -consume on a regular basis:  fresh fruits, fresh veggies, fish, nuts, seeds, healthy oils (such as olive oil, avocado oil), whole grains (make sure for bread/pasta/crackers/etc., that the first ingredient on label contains the word whole), legumes. -can eat small amounts of dairy and lean meat (no larger than the palm of your hand), but avoid processed meats such as ham, bacon, lunch meat, etc. -drink water  -try to avoid fast food and pre-packaged foods, processed meat, ultra processed foods/beverages (donuts, candy, etc.) -most experts advise limiting sodium to < 2300mg  per day, should limit further is any chronic conditions such as high blood pressure, heart disease, diabetes, etc. The American Heart Association advised that < 1500mg  is is ideal -try to avoid foods/beverages that contain any ingredients with names you do not recognize  -try to avoid foods/beverages  with added sugar or sweeteners/sweets  -try to avoid sweet drinks (including diet drinks): soda, juice, Gatorade, sweet tea, power drinks, diet drinks -try to avoid white rice, white bread, pasta (unless whole grain)  EXERCISE GUIDELINES FOR ADULTS: -if you wish to increase your physical activity, do so gradually and with the approval of your doctor -STOP and seek medical care immediately if you have any chest pain, chest discomfort or trouble breathing when starting or increasing exercise  -move and stretch your body, legs, feet and arms when sitting for long periods -Physical activity guidelines for optimal health in  adults: -get at least 150 minutes per week of moderate exercise (can talk, but not sing); this is about 20-30 minutes of sustained activity 5-7 days per week or two 10-15 minute  episodes of sustained activity 5-7 days per week -do some muscle building/resistance training/strength training at least 2 days per week  -balance exercises 3+ days per week:   Stand somewhere where you have something sturdy to hold onto if you lose balance    1) lift up on toes, then back down, start with 5x per day and work up to 20x   2) stand and lift one leg straight out to the side so that foot is a few inches of the floor, start with 5x each side and work up to 20x each side   3) stand on one foot, start with 5 seconds each side and work up to 20 seconds on each side  If you need ideas or help with getting more active:  -Silver sneakers https://tools.silversneakers.com  -Walk with a Doc: http://www.duncan-williams.com/  -try to include resistance (weight lifting/strength building) and balance exercises twice per week: or the following link for ideas: http://castillo-powell.com/  BuyDucts.dk  STRESS MANAGEMENT: -can try meditating, or just sitting quietly with deep breathing while intentionally relaxing all parts of your body for 5 minutes daily -if you need further help with stress, anxiety or depression please follow up with your primary doctor or contact the wonderful folks at [facility]: [phone]  SOCIAL CONNECTIONS: -options in [place] if you wish to engage in more social and exercise related activities:  -Silver sneakers https://tools.silversneakers.com  -Walk with a Doc: http://www.duncan-williams.com/  -Check out the [facility] Active Adults 50+ section on the [place] of [employer] (hiking clubs, book clubs, cards and games, chess, exercise classes, aquatic classes and much more) - see the website for details: https://www.[place].gov/departments/parks-recreation/active-adults50  -YouTube has lots of exercise videos for different ages and abilities as  well  -[facility] (a variety of indoor and outdoor inperson activities for adults). [phone]. [address]  -Virtual Online Classes (a variety of topics): see seniorplanet.org or call [phone]  -consider volunteering at a school, hospice center, church, senior center or elsewhere   FOR IMPROVED SLEEP AND TO RESET YOUR SLEEP SCHEDULE:  []  Schedule sleep counseling(cognitive behavioral therapy).   [facility] is a good option.   Call for appointment: [phone]  []  Exercise 30 minutes daily. Some people do better with exercise in the morning, other do better exercising later in the day.  []  Avoid caffeine and alcohol - particularly in the evenings. For some people even a little alcohol can impair sleep.   []  Go to bed and wake up at the same time everyday (within a 30 minute window). When you get up in the morning for your designated wake time - turn on lights and open curtains right away. This will help to set your internal sleep clock.   []  Keep bedroom cool, dark and quiet - if you have to get up at night make sure there is no white light from street lamps, night lights, lights, clock, devices etc. that enters your eyes. Instead, use red light flashlight or night lights and avoid looking directly at the light while up.   []  Set 1-2 hour bedtime routine, dim lights, avoid screens (phones, computers, TVs, etc during this time), consider sleepytime tea, warm bath or shower, avoid alcohol and caffeine  []  Reserve bed for sleep - do not read, watch TV, look at  phone or device, etc., in bed.  []  If you toss and turn for more then 10-15 minutes, get out of bed and list or journal thoughts or do quiet activity (not screen-time, no tv, phone, computer) then go back to bed. Repeat as needed. Try not to worry about when you will eventually fall asleep.  []  Some people find that a half dose of benadryl , melatonin, tylenol  pm or unisom on a few nights  per week is helpful initially for a few weeks.  []  Seek help for any depression or anxiety.  [] Prescription strength sleep medications should only be used in severe cases of insomnia if other measures fail and should be used sparingly.  I hope you are feeling better soon! Follow up with your doctor in 3-4 weeks or sooner if your symptoms worsen or new concerns arise.        ADVANCED HEALTHCARE DIRECTIVES:  [place] Advanced Directives assistance:   ExpressWeek.com.cy  Everyone should have advanced health care directives in place. This is so that you get the care you want, should you ever be in a situation where you are unable to make your own medical decisions.   From the [place] Advanced Directive Website: Advance Health Care Directives are legal documents in which you give written instructions about your health care if, in the future, you cannot speak for yourself.   A health care power of attorney allows you to name a person you trust to make your health care decisions if you cannot make them yourself. A declaration of a desire for a natural death (or living will) is document, which states that you desire not to have your life prolonged by extraordinary measures if you have a terminal or incurable illness or if you are in a vegetative state. An advance instruction for mental health treatment makes a declaration of instructions, information and preferences regarding your mental health treatment. It also states that you are aware that the advance instruction authorizes a mental health treatment provider to act according to your wishes. It may also outline your consent or refusal of mental health treatment. A declaration of an anatomical gift allows anyone over the age of 18 to make a gift by will, organ donor card or other document.   Please see the following website or an elder law attorney for forms, FAQs and for completion of  advanced directives: [place]  Print production planner Health Care Directives Advance Health Care Directives (http://guzman.com/)  Or copy and paste the following to your web browser: [email or web address]

## 2024-02-19 NOTE — Progress Notes (Signed)
 PATIENT CHECK-IN and HEALTH RISK ASSESSMENT QUESTIONNAIRE:  -completed by phone/video for upcoming Medicare Preventive Visit  Pre-Visit Check-in: 1)Vitals (height, wt, BP, etc) - record in vitals section for visit on day of visit Request home vitals (wt, BP, etc.) and enter into vitals, THEN update Vital Signs SmartPhrase below at the top of the HPI. See below.  2)Review and Update Medications, Allergies PMH, Surgeries, Social history in Epic 3)Hospitalizations in the last year with date/reason? Yes,  SOB 3 months ago  4)Review and Update Care Team (patient's specialists) in Epic 5) Complete PHQ9 in Epic  6) Complete Fall Screening in Epic 7)Review all Health Maintenance Due and order under PCP if not done.  Medicare Wellness Patient Questionnaire:  Answer theses question about your habits: How often do you have a drink containing alcohol?yes, 2 times a month  How many drinks containing alcohol do you have on a typical day when you are drinking?2 drinks  How often do you have six or more drinks on one occasion?no  Have you ever smoked?yes  Quit date if applicable? 25 years ago   How many packs a day do/did you smoke? N/A Do you use smokeless tobacco?No  Do you use an illicit drugs?NO  On average, how many days per week do you engage in moderate to strenuous exercise (like a brisk walk)?yes, 3 times a week  On average, how many minutes do you engage in exercise at this level?1 hour  Are you sexually active? No Number of partners?Na  Typical breakfast: coffee, fruit, eggs, juice  Typical lunch:chicken, egg Typical dinner:mashed potatoes, string bean,  soup, steak, chicken,  Typical snacks: corn bread, jello  Beverages: Coffee, juice, water , milk   Answer theses question about your everyday activities: Can you perform most household chores?yes  Are you deaf or have significant trouble hearing?no  Do you feel that you have a problem with memory?No  Do you feel safe at home?Yes  Last  dentist visit?unknown 8. Do you have any difficulty performing your everyday activities?no  Are you having any difficulty walking, taking medications on your own, and or difficulty managing daily home needs?no  Do you have difficulty walking or climbing stairs?no  Do you have difficulty dressing or bathing?no  Do you have difficulty doing errands alone such as visiting a doctor's office or shopping?yes, patient needs Interpreter Do you currently have any difficulty preparing food and eating?No  Do you currently have any difficulty using the toilet?NO  Do you have any difficulty managing your finances?NO  Do you have any difficulties with housekeeping of managing your housekeeping?NO    Do you have Advanced Directives in place (Living Will, Healthcare Power or Attorney)? NO    Last eye Exam and location?1 month    Do you currently use prescribed or non-prescribed narcotic or opioid pain medications?Yes   Do you have a history or close family history of breast, ovarian, tubal or peritoneal cancer or a family member with BRCA (breast cancer susceptibility 1 and 2) gene mutations? Yes, Mother had cancer    Nurse/Assistant Credentials/time stamp:Leah A.Wright CMA 4:00pm     ----------------------------------------------------------------------------------------------------------------------------------------------------------------------------------------------------------------------  Because this visit was a virtual/telehealth visit, some criteria may be missing or patient reported. Any vitals not documented were not able to be obtained and vitals that have been documented are patient reported.    MEDICARE ANNUAL PREVENTIVE VISIT WITH PROVIDER: (Welcome to Medicare, initial annual wellness or annual wellness exam)  Virtual Visit via Phone Note  I connected with Tiffany Berg  on 02/19/24 by phone and verified that I am speaking with [name] using two identifiers. She  preferred phone.  Location patient: home Location provider:work or home office Persons participating in the virtual visit: patient, provider, spanish interpreter  Concerns and/or follow up today: none, recently saw PCP about insomnia. Is trying Trazodone .    See HM section in Epic for other details of completed HM.    ROS: negative for report of fevers, unintentional weight loss, vision changes, vision loss, hearing loss or change, chest pain, sob, hemoptysis, melena, hematochezia, hematuria, falls, bleeding or bruising, thoughts of suicide or self harm, memory loss  Patient-completed extensive health risk assessment - reviewed and discussed with the patient: See Health Risk Assessment completed with patient prior to the visit either above or in recent phone note. This was reviewed in detailed with the patient today and appropriate recommendations, orders and referrals were placed as needed per Summary below and patient instructions.   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   w/ symptomatic bradycardia, rate 30s;  s/p  PPM 03-14-2020   Myalgia, multiple sites    Pre-diabetes    Symptomatic bradycardia 03/12/2020   s/p PPM   Wears glasses    Wears partial dentures    upper and lower    Past Surgical History:  Procedure Laterality Date   Candida Chalk OSTEOTOMY Right 10/15/2020   Procedure: Candida Chalk OSTEOTOMY;  Surgeon: Floyce Hutching, DPM;  Location: [facility];  Service: Podiatry;  Laterality: Right;  REGIONAL BLOCK   AIKEN OSTEOTOMY Left 02/24/2022   Procedure: Candida Chalk OSTEOTOMY;  Surgeon: Floyce Hutching, DPM;  Location: [facility];  Service: Podiatry;  Laterality: Left;   ANTERIOR AND POSTERIOR REPAIR N/A 02/18/2015   Procedure: ANTERIOR (CYSTOCELE) AND POSTERIOR REPAIR (RECTOCELE);  Surgeon: Renea Carrion, MD;  Location: [facility];  Service: Gynecology;  Laterality: N/A;   BLADDER SUSPENSION N/A 02/18/2015   Procedure: TRANSVAGINAL TAPE (TVT) PROCEDURE;  Surgeon: Renea Carrion, MD;  Location: [facility];  Service: Gynecology;  Laterality: N/A;   COLONOSCOPY     pt states she might have had one many years ago   CYSTOSCOPY Bilateral 02/18/2015   Procedure: CYSTOSCOPY;  Surgeon: Renea Carrion, MD;  Location: [facility];  Service: Gynecology;  Laterality: Bilateral;   GRAFT APPLICATION Left 02/24/2022   Procedure: BONE GRAFT;  Surgeon: Floyce Hutching, DPM;  Location: [facility];  Service: Podiatry;  Laterality: Left;  HALLUX VALGUS LAPIDUS Right 10/15/2020   Procedure: HALLUX VALGUS LAPIDUS AND BONE GRAFT;  Surgeon: Floyce Hutching, DPM;  Location: [facility] [place];  Service: Podiatry;  Laterality: Right;  WITH IV SEDATION; REGIONAL BLOCK   HALLUX VALGUS LAPIDUS Left 02/24/2022   Procedure: HALLUX VALGUS LAPIDUS;  Surgeon: Floyce Hutching, DPM;  Location: [facility] [place];  Service: Podiatry;  Laterality: Left;   HAMMER TOE SURGERY Right 10/15/2020    Procedure: HAMMER TOE CORRECTION;  Surgeon: Floyce Hutching, DPM;  Location: [facility] [place];  Service: Podiatry;  Laterality: Right;  REGIONAL BLOCK   HAMMER TOE SURGERY Left 02/24/2022   Procedure: HAMMER TOE CORRECTION SECOND TOE LEFT FOOT;  Surgeon: Floyce Hutching, DPM;  Location: [facility] [place];  Service: Podiatry;  Laterality: Left;   LAPAROSCOPIC ASSISTED VAGINAL HYSTERECTOMY N/A 02/18/2015   Procedure: LAPAROSCOPIC ASSISTED VAGINAL HYSTERECTOMY;  Surgeon: Renea Carrion, MD;  Location: [facility];  Service: Gynecology;  Laterality: N/A;   MASTECTOMY, PARTIAL Left 08-12-2013  @[facility]    excision another site   PACEMAKER IMPLANT N/A 03/14/2020   Procedure: PACEMAKER IMPLANT;  Surgeon: Lei Pump, MD;  Location: [facility];  Service: Cardiovascular;  Laterality: N/A;   RADIOACTIVE SEED GUIDED PARTIAL MASTECTOMY/AXILLARY SENTINEL NODE BIOPSY/AXILLARY NODE DISSECTION Left 07-22-2013  @[facility]    SALPINGOOPHORECTOMY Bilateral 02/18/2015   Procedure: BILATERAL SALPINGO OOPHORECTOMY;  Surgeon: Renea Carrion, MD;  Location: [facility];  Service: Gynecology;  Laterality: Bilateral;    Social History   Socioeconomic History   Marital status: Legally Separated    Spouse name: Not on file   Number of children: Not on file   Years of education: Not on file   Highest education level: Not on file  Occupational History   Not on file  Tobacco Use   Smoking status: Never   Smokeless tobacco: Never  Vaping Use   Vaping status: Never Used  Substance and Sexual Activity   Alcohol use: Yes    Comment: socially   Drug use: Never   Sexual activity: Yes    Birth control/protection: Post-menopausal, Surgical  Other Topics Concern   Not on file  Social History Narrative   Not on file   Social Drivers of Health   Financial Resource Strain: Low Risk  (02/22/2022)   Overall Financial Resource Strain (CARDIA)    Difficulty of Paying Living Expenses: Not hard at all   Food Insecurity: No Food Insecurity (02/22/2022)   Hunger Vital Sign    Worried About Running Out of Food in the Last Year: Never true    Ran Out of Food in the Last Year: Never true  Transportation Needs: No Transportation Needs (02/22/2022)   PRAPARE - Administrator, Civil Service (Medical): No    Lack of Transportation (Non-Medical): No  Physical Activity: Insufficiently Active (02/22/2022)   Exercise Vital Sign    Days of Exercise per Week: 2 days    Minutes of Exercise per Session: 60 min  Stress: No Stress Concern Present (02/22/2022)   [employer] of Occupational Health - Occupational Stress Questionnaire    Feeling of Stress : Not at all  Social Connections: Unknown (02/22/2022)   Social Connection and Isolation Panel    Frequency of Communication with Friends and Family: More than three times a week    Frequency of Social Gatherings with Friends and Family: More than three times a week    Attends Religious Services: Not on file    Active  Member of Clubs or Organizations: Yes    Attends Engineer, structural: More than 4 times per year    Marital Status: Separated  Intimate Partner Violence: Not At Risk (02/22/2022)   Humiliation, Afraid, Rape, and Kick questionnaire    Fear of Current or Ex-Partner: No    Emotionally Abused: No    Physically Abused: No    Sexually Abused: No    Family History  Problem Relation Age of Onset   Heart disease Mother    Colon cancer Father    Cancer Father        prostata   Stomach cancer Sister    Cancer Sister 46       Breast   Esophageal cancer Neg Hx    Rectal cancer Neg Hx     Current Outpatient Medications on File Prior to Visit  Medication Sig Dispense Refill   aspirin  EC 81 MG tablet Take 81 mg by mouth daily. Swallow whole.     B Complex Vitamins (VITAMIN B COMPLEX PO) Take 1 tablet by mouth daily.     Cyanocobalamin (VITAMIN B 12) 100 MCG LOZG Take by mouth.     doxylamine, Sleep, (UNISOM) 25 MG  tablet Take 25 mg by mouth at bedtime as needed. As need     fluticasone  (FLONASE ) 50 MCG/ACT nasal spray Place 1 spray into both nostrils 2 (two) times daily. 16 g 0   levothyroxine  (SYNTHROID ) 75 MCG tablet Take 1 tablet (75 mcg total) by mouth daily before breakfast. 90 tablet 3   losartan  (COZAAR ) 50 MG tablet Take 1 tablet (50 mg total) by mouth daily. 30 tablet 11   Magnesium  200 MG TABS Take by mouth daily at 6 (six) AM.     Omega 3 1000 MG CAPS Take 1,000 mg by mouth daily.     omeprazole  (PRILOSEC) 40 MG capsule Take 1 capsule (40 mg total) by mouth daily. 60 capsule 0   OVER THE COUNTER MEDICATION Vitamin D 3 1000 mg     rosuvastatin  (CRESTOR ) 10 MG tablet Take 1 tablet (10 mg total) by mouth daily. 90 tablet 3   traZODone  (DESYREL ) 50 MG tablet Take 0.5 tablets (25 mg total) by mouth at bedtime. 30 tablet 1   No current facility-administered medications on file prior to visit.    No Known Allergies     Physical Exam Vitals requested from patient and listed below if patient had equipment and was able to obtain at home for this virtual visit: There were no vitals filed for this visit. Estimated body mass index is 26.81 kg/m as calculated from the following:   Height as of 02/06/24: 5' 3 (1.6 m).   Weight as of 02/06/24: 151 lb 6 oz (68.7 kg).  EKG (optional): deferred due to virtual visit  GENERAL: alert, oriented, no acute distress detected, full vision exam deferred due to pandemic and/or virtual encounter  PSYCH/NEURO: pleasant and cooperative, no obvious depression or anxiety, speech and thought processing grossly intact, Cognitive function grossly intact  Flowsheet Row Clinical Support from 02/19/2024 in [facility]  PHQ-9 Total Score 13        02/19/2024    3:38 PM 02/06/2024    4:01 PM 06/01/2023    7:31 AM 10/18/2022    3:12 PM 08/30/2022    4:32 PM  Depression screen PHQ 2/9  Decreased Interest 2 2 1  0 0  Down, Depressed, Hopeless  2 2 1  0 0  PHQ -  2 Score 4 4 2  0 0  Altered sleeping 3 3 0    Tired, decreased energy 2 2 3     Change in appetite 2 2 0    Feeling bad or failure about yourself  0 0 1    Trouble concentrating 1 1 1     Moving slowly or fidgety/restless 1 1 0    Suicidal thoughts 0 0 0    PHQ-9 Score 13 13 7     Difficult doing work/chores  Somewhat difficult Not difficult at all         02/06/2022    9:56 AM 02/22/2022    3:08 PM 08/30/2022    4:31 PM 10/18/2022    3:12 PM 02/19/2024    3:38 PM  Fall Risk  Falls in the past year? 0 0 0 0 0  Was there an injury with Fall? 0 0 0 0 0  Fall Risk Category Calculator 0 0 0 0   Fall Risk Category (Retired) Low  Low  Low     (RETIRED) Patient Fall Risk Level Low fall risk  Low fall risk  Low fall risk     Patient at Risk for Falls Due to No Fall Risks No Fall Risks No Fall Risks No Fall Risks No Fall Risks  Fall risk Follow up Falls evaluation completed   Falls evaluation completed  Falls evaluation completed Falls evaluation completed     Data saved with a previous flowsheet row definition     SUMMARY AND PLAN:  Encounter for Medicare annual wellness exam   Discussed applicable health maintenance/preventive health measures and advised and referred or ordered per patient preferences: -she reports she gets her mammograms yearly in [place], her daughter arranges for her, she does not have the information, but she agrees to get this for her -she plans to get the shingles vaccine at the pharmacy -Dr. Swaziland ordered the bone density test, gave her number to call to check on scheduling her bone density test  Health Maintenance  Topic Date Due   Diabetic kidney evaluation - Urine ACR  Never done   Zoster Vaccines- Shingrix (1 of 2) Never done   MAMMOGRAM  03/02/2021   INFLUENZA VACCINE  04/04/2024   Diabetic kidney evaluation - eGFR measurement  12/19/2024   Medicare Annual Wellness (AWV)  02/18/2025   Colonoscopy  09/02/2026   DTaP/Tdap/Td (2 - Td  or Tdap) 02/27/2033   Pneumococcal Vaccine: 50+ Years  Completed   DEXA SCAN  Completed   Hepatitis C Screening  Completed   HPV VACCINES  Aged Out   Meningococcal B Vaccine  Aged Out   COVID-19 Vaccine  Discontinued      Education and counseling on the following was provided based on the above review of health and a plan/checklist for the patient, along with additional information discussed, was provided for the patient in the patient instructions :  -Advised on importance of completing advanced directives, discussed options for completing and provided information in patient instructions as well -Advised and counseled on a healthy lifestyle - including the importance of a healthy diet, regular physical activity, social connections and stress management. -Reviewed patient's current diet. Advised and counseled on a whole foods based healthy diet. A summary of a healthy diet was provided in the Patient Instructions.  -reviewed patient's current physical activity level and discussed exercise guidelines for adults. Discussed community resources and ideas for safe exercise at home to assist in meeting exercise guideline recommendations in a safe  and healthy way.  -Advise yearly dental visits at minimum and regular eye exams -Discussed sleep, icbt, triggers for circadian clock, avoidance of light (other than red light), no screens or bright light 1-2 hours prior to sleep, timing of exercise, etc.  Follow up: see patient instructions     Patient Instructions  I really enjoyed getting to talk with you today! I am available on Tuesdays and Thursdays for virtual visits if you have any questions or concerns, or if I can be of any further assistance.   CHECKLIST FROM ANNUAL WELLNESS VISIT:  -Follow up (please call to schedule if not scheduled after visit):   -yearly for annual wellness visit with primary care office  Here is a list of your preventive care/health maintenance measures and the plan  for each if any are due:  PLAN For any measures below that may be due:    1.) Please obtain a copy of the mammogram and bring it to Dr. Swaziland.   2.) You can get your shingles vaccine at the pharmacy.  Health Maintenance  Topic Date Due   Diabetic kidney evaluation - Urine ACR  Never done   Zoster Vaccines- Shingrix (1 of 2) Never done   MAMMOGRAM  03/02/2021   INFLUENZA VACCINE  04/04/2024   Diabetic kidney evaluation - eGFR measurement  12/19/2024   Medicare Annual Wellness (AWV)  02/18/2025   Colonoscopy  09/02/2026   DTaP/Tdap/Td (2 - Td or Tdap) 02/27/2033   Pneumococcal Vaccine: 50+ Years  Completed   DEXA SCAN  Completed   Hepatitis C Screening  Completed   HPV VACCINES  Aged Out   Meningococcal B Vaccine  Aged Out   COVID-19 Vaccine  Discontinued    -See a dentist at least yearly  -Get your eyes checked and then per your eye specialist's recommendations  -Other issues addressed today:   -I have included below further information regarding a healthy whole foods based diet, physical activity guidelines for adults, stress management and opportunities for social connections. I hope you find this information useful.   -----------------------------------------------------------------------------------------------------------------------------------------------------------------------------------------------------------------------------------------------------------    NUTRITION: -eat real food: lots of colorful vegetables (half the plate) and fruits -5-7 servings of vegetables and fruits per day (fresh or steamed is best), exp. 2 servings of vegetables with lunch and dinner and 2 servings of fruit per day. Berries and greens such as kale and collards are great choices.  -consume on a regular basis:  fresh fruits, fresh veggies, fish, nuts, seeds, healthy oils (such as olive oil, avocado oil), whole grains (make sure for bread/pasta/crackers/etc., that the first ingredient  on label contains the word whole), legumes. -can eat small amounts of dairy and lean meat (no larger than the palm of your hand), but avoid processed meats such as ham, bacon, lunch meat, etc. -drink water  -try to avoid fast food and pre-packaged foods, processed meat, ultra processed foods/beverages (donuts, candy, etc.) -most experts advise limiting sodium to < 2300mg  per day, should limit further is any chronic conditions such as high blood pressure, heart disease, diabetes, etc. The American Heart Association advised that < 1500mg  is is ideal -try to avoid foods/beverages that contain any ingredients with names you do not recognize  -try to avoid foods/beverages  with added sugar or sweeteners/sweets  -try to avoid sweet drinks (including diet drinks): soda, juice, Gatorade, sweet tea, power drinks, diet drinks -try to avoid white rice, white bread, pasta (unless whole grain)  EXERCISE GUIDELINES FOR ADULTS: -if you wish to increase your  physical activity, do so gradually and with the approval of your doctor -STOP and seek medical care immediately if you have any chest pain, chest discomfort or trouble breathing when starting or increasing exercise  -move and stretch your body, legs, feet and arms when sitting for long periods -Physical activity guidelines for optimal health in adults: -get at least 150 minutes per week of moderate exercise (can talk, but not sing); this is about 20-30 minutes of sustained activity 5-7 days per week or two 10-15 minute episodes of sustained activity 5-7 days per week -do some muscle building/resistance training/strength training at least 2 days per week  -balance exercises 3+ days per week:   Stand somewhere where you have something sturdy to hold onto if you lose balance    1) lift up on toes, then back down, start with 5x per day and work up to 20x   2) stand and lift one leg straight out to the side so that foot is a few inches of the floor, start with  5x each side and work up to 20x each side   3) stand on one foot, start with 5 seconds each side and work up to 20 seconds on each side  If you need ideas or help with getting more active:  -Silver sneakers https://tools.silversneakers.com  -Walk with a Doc: http://www.duncan-williams.com/  -try to include resistance (weight lifting/strength building) and balance exercises twice per week: or the following link for ideas: http://castillo-powell.com/  BuyDucts.dk  STRESS MANAGEMENT: -can try meditating, or just sitting quietly with deep breathing while intentionally relaxing all parts of your body for 5 minutes daily -if you need further help with stress, anxiety or depression please follow up with your primary doctor or contact the wonderful folks at [facility]: [phone]  SOCIAL CONNECTIONS: -options in [place] if you wish to engage in more social and exercise related activities:  -Silver sneakers https://tools.silversneakers.com  -Walk with a Doc: http://www.duncan-williams.com/  -Check out the [facility] Active Adults 50+ section on the [place] of [employer] (hiking clubs, book clubs, cards and games, chess, exercise classes, aquatic classes and much more) - see the website for details: https://www.[place].gov/departments/parks-recreation/active-adults50  -YouTube has lots of exercise videos for different ages and abilities as well  -[facility] (a variety of indoor and outdoor inperson activities for adults). 5191437536. [address]  -Virtual Online Classes (a variety of topics): see seniorplanet.org or call [phone]  -consider volunteering at a school, hospice center, church, senior center or elsewhere   FOR IMPROVED SLEEP AND TO RESET YOUR SLEEP SCHEDULE:  []  Schedule sleep counseling(cognitive behavioral therapy).   [facility] is a good option.   Call for appointment: [phone]  []  Exercise 30 minutes daily. Some people do better with exercise in the morning, other do better exercising later in the day.  []  Avoid caffeine and alcohol - particularly in the evenings. For some people even a little alcohol can impair sleep.   []  Go to bed and wake up at the same time everyday (within a 30 minute window). When you get up in the morning for your designated wake time - turn on lights and open curtains right away. This will help to set your internal sleep clock.   []  Keep bedroom cool, dark and quiet - if you have to get up at night make sure there is no white light from street lamps, night lights, lights, clock, devices etc. that enters your eyes. Instead, use red light flashlight  or night lights and avoid looking directly at the light while up.   []  Set 1-2 hour bedtime routine, dim lights, avoid screens (phones, computers, TVs, etc during this time), consider sleepytime tea, warm bath or shower, avoid alcohol and caffeine  []  Reserve bed for sleep - do not read, watch TV, look at phone or device, etc., in bed.  []  If you toss and turn for more then 10-15 minutes, get out of bed and list or journal thoughts or do quiet activity (not screen-time, no tv, phone, computer) then go back to bed. Repeat as needed. Try not to worry about when you will eventually fall asleep.  []  Some people find that a half dose of benadryl , melatonin, tylenol  pm or unisom on a few nights per week is helpful initially for a few weeks.  []  Seek help for any depression or anxiety.  [] Prescription strength sleep medications should only be used in severe cases of insomnia if other measures fail and should be used sparingly.  I hope you are feeling better soon! Follow up with your doctor in 3-4 weeks or sooner if your symptoms worsen or new concerns arise.        ADVANCED HEALTHCARE DIRECTIVES:  [place] Advanced  Directives assistance:   [email or web address].cy  Everyone should have advanced health care directives in place. This is so that you get the care you want, should you ever be in a situation where you are unable to make your own medical decisions.   From the [place] Advanced Directive Website: Advance Health Care Directives are legal documents in which you give written instructions about your health care if, in the future, you cannot speak for yourself.   A health care power of attorney allows you to name a Berg you trust to make your health care decisions if you cannot make them yourself. A declaration of a desire for a natural death (or living will) is document, which states that you desire not to have your life prolonged by extraordinary measures if you have a terminal or incurable illness or if you are in a vegetative state. An advance instruction for mental health treatment makes a declaration of instructions, information and preferences regarding your mental health treatment. It also states that you are aware that the advance instruction authorizes a mental health treatment provider to act according to your wishes. It may also outline your consent or refusal of mental health treatment. A declaration of an anatomical gift allows anyone over the age of 85 to make a gift by will, organ donor card or other document.   Please see the following website or an elder law attorney for forms, FAQs and for completion of advanced directives: [place]  Print production planner Health Care Directives Advance Health Care Directives ([email or web address]  Or copy and paste the following to your web browser: [email or web address]    Maurie Southern, DO

## 2024-02-25 DIAGNOSIS — H40011 Open angle with borderline findings, low risk, right eye: Secondary | ICD-10-CM | POA: Diagnosis not present

## 2024-02-25 DIAGNOSIS — H353121 Nonexudative age-related macular degeneration, left eye, early dry stage: Secondary | ICD-10-CM | POA: Diagnosis not present

## 2024-02-25 DIAGNOSIS — H401223 Low-tension glaucoma, left eye, severe stage: Secondary | ICD-10-CM | POA: Diagnosis not present

## 2024-02-25 DIAGNOSIS — H353211 Exudative age-related macular degeneration, right eye, with active choroidal neovascularization: Secondary | ICD-10-CM | POA: Diagnosis not present

## 2024-02-25 NOTE — Progress Notes (Signed)
 Remote pacemaker transmission.

## 2024-03-31 NOTE — Progress Notes (Signed)
 [facility] - Clinic Note  04/08/2024   CHIEF COMPLAINT Patient presents for Retina Follow Up  HISTORY OF PRESENT ILLNESS: Tiffany Berg is a 68 y.o. female who presents to the clinic today for:  HPI     Retina Follow Up   Patient presents with  Wet AMD.  In both eyes.  This started 9 weeks ago.  Duration of 9 weeks.  Since onset it is stable.  I, the attending physician,  performed the HPI with the patient and updated documentation appropriately.        Comments   9 week retina ARMD and IVA OD pt is reporting no vision changes noticed she denies any flashes or floaters       Last edited by Valdemar Rogue, MD on 04/08/2024 10:28 PM.      Pt states her vision is good, she had a harder time reading the eye chart with her left eye today  Referring physician: Fleeta Zerita DASEN, MD [address] [place],  [place] [address]  HISTORICAL INFORMATION:  Selected notes from the MEDICAL RECORD NUMBER Referred by Dr. Fleeta for ARMD eval LEE:  Ocular Hx- PMH-   CURRENT MEDICATIONS: No current outpatient medications on file. (Ophthalmic Drugs)   No current facility-administered medications for this visit. (Ophthalmic Drugs)   Current Outpatient Medications (Other)  Medication Sig   aspirin  EC 81 MG tablet Take 81 mg by mouth daily. Swallow whole.   B Complex Vitamins (VITAMIN B COMPLEX PO) Take 1 tablet by mouth daily.   Cyanocobalamin (VITAMIN B 12) 100 MCG LOZG Take by mouth.   doxylamine, Sleep, (UNISOM) 25 MG tablet Take 25 mg by mouth at bedtime as needed. As need   fluticasone  (FLONASE ) 50 MCG/ACT nasal spray Place 1 spray into both nostrils 2 (two) times daily.   levothyroxine  (SYNTHROID ) 75 MCG tablet Take 1 tablet (75 mcg total) by mouth daily before breakfast.   losartan  (COZAAR ) 50 MG tablet Take 1 tablet (50 mg total) by mouth daily.   Magnesium  200 MG TABS Take by mouth daily at 6 (six) AM.   Omega 3 1000 MG CAPS Take 1,000 mg by mouth daily.    omeprazole  (PRILOSEC) 40 MG capsule Take 1 capsule (40 mg total) by mouth daily.   OVER THE COUNTER MEDICATION Vitamin D 3 1000 mg   rosuvastatin  (CRESTOR ) 10 MG tablet Take 1 tablet (10 mg total) by mouth daily.   traZODone  (DESYREL ) 50 MG tablet Take 0.5 tablets (25 mg total) by mouth at bedtime.   No current facility-administered medications for this visit. (Other)   REVIEW OF SYSTEMS: ROS   Positive for: Gastrointestinal, Endocrine, Cardiovascular, Eyes Negative for: Constitutional, Neurological, Skin, Genitourinary, Musculoskeletal, HENT, Respiratory, Psychiatric, Allergic/Imm, Heme/Lymph Last edited by Resa Delon ORN, COT on 04/08/2024  2:18 PM.       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   w/ symptomatic bradycardia, rate 30s;  s/p  PPM 03-14-2020   Myalgia, multiple sites    Pre-diabetes    Symptomatic bradycardia 03/12/2020   s/p PPM   Wears glasses    Wears partial dentures    upper and lower   Past Surgical History:  Procedure Laterality Date   KATRINA OSTEOTOMY Right 10/15/2020   Procedure: KATRINA OSTEOTOMY;  Surgeon: Silva Juliene SAUNDERS, DPM;  Location: [facility] [place];  Service: Podiatry;  Laterality: Right;  REGIONAL BLOCK   AIKEN OSTEOTOMY Left 02/24/2022    Procedure: KATRINA OSTEOTOMY;  Surgeon: Silva Juliene SAUNDERS, DPM;  Location: [facility];  Service: Podiatry;  Laterality: Left;   ANTERIOR AND POSTERIOR REPAIR N/A 02/18/2015   Procedure: ANTERIOR (CYSTOCELE) AND POSTERIOR REPAIR (RECTOCELE);  Surgeon: Jon Rummer, MD;  Location: [facility];  Service: Gynecology;  Laterality: N/A;   BLADDER SUSPENSION N/A 02/18/2015   Procedure: TRANSVAGINAL TAPE (TVT) PROCEDURE;  Surgeon: Jon Rummer, MD;  Location: [facility];  Service: Gynecology;  Laterality: N/A;   COLONOSCOPY     pt states she might have had one many years ago   CYSTOSCOPY Bilateral 02/18/2015   Procedure: CYSTOSCOPY;  Surgeon: Jon Rummer, MD;  Location: [facility];  Service: Gynecology;  Laterality: Bilateral;   GRAFT APPLICATION Left 02/24/2022   Procedure: BONE GRAFT;  Surgeon: Silva Juliene SAUNDERS, DPM;  Location: [facility];  Service: Podiatry;  Laterality: Left;   HALLUX VALGUS LAPIDUS Right 10/15/2020   Procedure: HALLUX VALGUS LAPIDUS AND BONE GRAFT;  Surgeon: Silva Juliene SAUNDERS, DPM;  Location: [facility];  Service: Podiatry;  Laterality: Right;  WITH IV SEDATION; REGIONAL BLOCK   HALLUX VALGUS LAPIDUS Left 02/24/2022   Procedure: HALLUX VALGUS LAPIDUS;  Surgeon: Silva Juliene SAUNDERS, DPM;  Location: [facility];  Service: Podiatry;  Laterality: Left;   HAMMER TOE SURGERY Right 10/15/2020   Procedure: HAMMER TOE CORRECTION;  Surgeon: Silva Juliene SAUNDERS, DPM;  Location: [facility];  Service: Podiatry;  Laterality: Right;  REGIONAL BLOCK   HAMMER TOE SURGERY Left 02/24/2022   Procedure: HAMMER TOE CORRECTION SECOND TOE LEFT FOOT;  Surgeon: Silva Juliene SAUNDERS, DPM;  Location: [facility] [place];  Service: Podiatry;  Laterality: Left;   LAPAROSCOPIC ASSISTED VAGINAL HYSTERECTOMY N/A 02/18/2015   Procedure: LAPAROSCOPIC ASSISTED VAGINAL HYSTERECTOMY;  Surgeon: Jon Rummer, MD;  Location: [facility];  Service:  Gynecology;  Laterality: N/A;   MASTECTOMY, PARTIAL Left 08-12-2013  @[facility]    excision another site   PACEMAKER IMPLANT N/A 03/14/2020   Procedure: PACEMAKER IMPLANT;  Surgeon: Inocencio Soyla Lunger, MD;  Location: [facility];  Service: Cardiovascular;  Laterality: N/A;   RADIOACTIVE SEED GUIDED PARTIAL MASTECTOMY/AXILLARY SENTINEL NODE BIOPSY/AXILLARY NODE DISSECTION Left 07-22-2013  @[facility]    SALPINGOOPHORECTOMY Bilateral 02/18/2015   Procedure: BILATERAL SALPINGO OOPHORECTOMY;  Surgeon: Jon Rummer, MD;  Location: [facility];  Service: Gynecology;  Laterality: Bilateral;   FAMILY HISTORY Family History  Problem Relation Age of Onset   Heart disease Mother    Colon cancer Father    Cancer Father        prostata   Stomach cancer Sister    Cancer Sister 41       Breast   Esophageal cancer Neg Hx    Rectal cancer Neg Hx    SOCIAL HISTORY Social  History   Tobacco Use   Smoking status: Never   Smokeless tobacco: Never  Vaping Use   Vaping status: Never Used  Substance Use Topics   Alcohol use: Yes    Comment: socially   Drug use: Never       OPHTHALMIC EXAM:  Base Eye Exam     Visual Acuity (Snellen - Linear)       Right Left   Dist cc 20/20 -3 20/25 -2         Tonometry (Tonopen, 2:23 PM)       Right Left   Pressure 18 16         Pupils       Pupils Dark Light Shape React APD   Right PERRL 3 2 Round Brisk None   Left PERRL 3 2 Round Brisk None         Visual Fields       Left Right    Full Full         Neuro/Psych     Oriented x3: Yes   Mood/Affect: Normal         Dilation     Both eyes: 2.5% Phenylephrine  @ 2:24 PM           Slit Lamp and Fundus Exam     Slit Lamp Exam       Right Left   Lids/Lashes Dermatochalasis - upper lid Dermatochalasis - upper lid   Conjunctiva/Sclera mild nasal pterygium mild nasal pterygium   Cornea nasal pterygium, trace tear film debris nasal pterygium, trace tear film debris, 1+ PEE    Anterior Chamber deep and clear deep and clear   Iris Round and dilated, No NVI Round and dilated, No NVI   Lens 2+ Nuclear sclerosis, 2-3+ Cortical cataract 2+ Nuclear sclerosis, 2+ Cortical cataract   Anterior Vitreous mild syneresis mild syneresis         Fundus Exam       Right Left   Disc Pink and Sharp, Compact Pink and Sharp, Compact   C/D Ratio 0.4 0.4   Macula Flat, good foveal reflex, focal CNV with shallow SRF and edema ST macula -- stably improved, mild drusen, no frank heme Flat, Good foveal reflex, fine drusen, No heme or edema   Vessels attenuated, mild tortuosity attenuated, mild tortuosity   Periphery Attached, No heme Attached, No heme           Refraction     Wearing Rx       Sphere Cylinder Axis Add   Right -0.50 +0.75 114 +2.25   Left -0.50 +2.50 066 +2.25           IMAGING AND PROCEDURES  Imaging and Procedures for 04/08/2024  OCT, Retina - OU - Both Eyes       Right Eye Quality was good. Central Foveal Thickness: 247. Progression has been stable. Findings include normal foveal contour, no IRF, no SRF, retinal drusen , subretinal hyper-reflective material, pigment epithelial detachment (Focal PED with stable improvement in shallow SRF ST to fovea, partial PVD ).   Left Eye Quality was good. Central Foveal Thickness: 247. Progression has been stable. Findings include normal foveal contour, no IRF, no SRF, retinal drusen .   Notes *Images captured and stored on drive  Diagnosis / Impression:  OD: Focal PED/CNV with stable improvement in shallow SRF ST to fovea, partial PVD OS: NFP, no IRF/SRF  Clinical management:  See below  Abbreviations: NFP - Normal foveal profile. CME -  cystoid macular edema. PED - pigment epithelial detachment. IRF - intraretinal fluid. SRF - subretinal fluid. EZ - ellipsoid zone. ERM - epiretinal membrane. ORA - outer retinal atrophy. ORT - outer retinal tubulation. SRHM - subretinal hyper-reflective material. IRHM -  intraretinal hyper-reflective material      Intravitreal Injection, Pharmacologic Agent - OD - Right Eye       Time Out 04/08/2024. 3:19 PM. Confirmed correct patient, procedure, site, and patient consented.   Anesthesia Topical anesthesia was used. Anesthetic medications included Lidocaine  2%, Proparacaine 0.5%.   Procedure Preparation included 5% betadine to ocular surface, eyelid speculum. A supplied needle was used.   Injection: 1.25 mg Bevacizumab  1.25mg /0.55ml   Route: Intravitreal, Site: Right Eye   NDC: 49757-939-98, Lot: [id number], Expiration date: 04/28/2024   Post-op Post injection exam found visual acuity of at least counting fingers. The patient tolerated the procedure well. There were no complications. The patient received written and verbal post procedure care education.             ASSESSMENT/PLAN:   ICD-10-CM   1. Exudative age-related macular degeneration of right eye with active choroidal neovascularization (HCC)  H35.3211 OCT, Retina - OU - Both Eyes    Intravitreal Injection, Pharmacologic Agent - OD - Right Eye    Bevacizumab  (AVASTIN ) SOLN 1.25 mg    2. Intermediate stage nonexudative age-related macular degeneration of left eye  H35.3122     3. Diabetes mellitus type 2 without retinopathy (HCC)  E11.9     4. Essential hypertension  I10     5. Hypertensive retinopathy of both eyes  H35.033     6. Combined forms of age-related cataract of both eyes  H25.813      Exudative age related macular degeneration OD  - s/p IVA OD #1 (01.14.25), #2 (02.11.25), #3 (03.11.25), #4 (04.15.25),#5 (06.03.25)  - FA (01.14.24) shows foval CNV with leakage superior fovea  - BCVA OD 20/25 -- stable  - OCT shows focal PED/CNV with stable improvement in shallow SRF ST to fovea at 9 wks  - recommend IVA OD #6 today, 08.05.25 w/ f/u ext to 12 wks  - pt wishes to be treated with IVA  - RBA of procedure discussed, questions answered - IVA informed consent obtained and  signed, 01.14.25 (OD) - see procedure note - f/u in 12 wks, DFE, OCT, possible injection  2. Age related macular degeneration, non-exudative, OS  - The incidence, anatomy, and pathology of dry AMD, risk of progression, and the AREDS and AREDS 2 study including smoking risks discussed with patient.  - Recommend amsler grid monitoring  - monitor  3. Diabetes mellitus, type 2 without retinopathy  - A1c: 6.4 on 09.27.24 - The incidence, risk factors for progression, natural history and treatment options for diabetic retinopathy  were discussed with patient.   - The need for close monitoring of blood glucose, blood pressure, and serum lipids, avoiding cigarette or any type of tobacco, and the need for long term follow up was also discussed with patient. - f/u in 1 year, sooner prn  4,5. Hypertensive retinopathy OU - discussed importance of tight BP control - monitor  6. Mixed Cataract OU - The symptoms of cataract, surgical options, and treatments and risks were discussed with patient. - discussed diagnosis and progression - monitor   Ophthalmic Meds Ordered this visit:  Meds ordered this encounter  Medications   Bevacizumab  (AVASTIN ) SOLN 1.25 mg     Return in about 12 weeks (around 07/01/2024) for  f/u exu ARMD OD, DFE, OCT, Possible Injxn.  There are no Patient Instructions on file for this visit.  Explained the diagnoses, plan, and follow up with the patient and they expressed understanding.  Patient expressed understanding of the importance of proper follow up care.   This document serves as a record of services personally performed by Redell JUDITHANN Hans, MD, PhD. It was created on their behalf by Avelina Pereyra, COA an ophthalmic technician. The creation of this record is the provider's dictation and/or activities during the visit.   Electronically signed by: Avelina GORMAN Pereyra, COT  04/08/24  10:29 PM   This document serves as a record of services personally performed by Redell JUDITHANN Hans, MD, PhD. It was created on their behalf by Alan PARAS. Delores, OA an ophthalmic technician. The creation of this record is the provider's dictation and/or activities during the visit.    Electronically signed by: Alan PARAS. Delores, OA 04/08/24 10:29 PM  Redell JUDITHANN Hans, M.D., Ph.D. Diseases & Surgery of the Retina and Vitreous [facility]  I have reviewed the above documentation for accuracy and completeness, and I agree with the above. Redell JUDITHANN Hans, M.D., Ph.D. 04/08/24 10:30 PM   Abbreviations: M myopia (nearsighted); A astigmatism; H hyperopia (farsighted); P presbyopia; Mrx spectacle prescription;  CTL contact lenses; OD right eye; OS left eye; OU both eyes  XT exotropia; ET esotropia; PEK punctate epithelial keratitis; PEE punctate epithelial erosions; DES dry eye syndrome; MGD meibomian gland dysfunction; ATs artificial tears; PFAT's preservative free artificial tears; NSC nuclear sclerotic cataract; PSC posterior subcapsular cataract; ERM epi-retinal membrane; PVD posterior vitreous detachment; RD retinal detachment; DM diabetes mellitus; DR diabetic retinopathy; NPDR non-proliferative diabetic retinopathy; PDR proliferative diabetic retinopathy; CSME clinically significant macular edema; DME diabetic macular edema; dbh dot blot hemorrhages; CWS cotton wool spot; POAG primary open angle glaucoma; C/D cup-to-disc ratio; HVF humphrey visual field; GVF goldmann visual field; OCT optical coherence tomography; IOP intraocular pressure; BRVO Branch retinal vein occlusion; CRVO central retinal vein occlusion; CRAO central retinal artery occlusion; BRAO branch retinal artery occlusion; RT retinal tear; SB scleral buckle; PPV pars plana vitrectomy; VH Vitreous hemorrhage; PRP panretinal laser photocoagulation; IVK intravitreal kenalog; VMT vitreomacular traction; MH Macular hole;  NVD neovascularization of the disc; NVE neovascularization elsewhere; AREDS age related eye disease  study; ARMD age related macular degeneration; POAG primary open angle glaucoma; EBMD epithelial/anterior basement membrane dystrophy; ACIOL anterior chamber intraocular lens; IOL intraocular lens; PCIOL posterior chamber intraocular lens; Phaco/IOL phacoemulsification with intraocular lens placement; PRK photorefractive keratectomy; LASIK laser assisted in situ keratomileusis; HTN hypertension; DM diabetes mellitus; COPD chronic obstructive pulmonary disease

## 2024-04-08 ENCOUNTER — Encounter (INDEPENDENT_AMBULATORY_CARE_PROVIDER_SITE_OTHER): Payer: Self-pay | Admitting: Ophthalmology

## 2024-04-08 ENCOUNTER — Ambulatory Visit (INDEPENDENT_AMBULATORY_CARE_PROVIDER_SITE_OTHER): Admitting: Ophthalmology

## 2024-04-08 DIAGNOSIS — H353211 Exudative age-related macular degeneration, right eye, with active choroidal neovascularization: Secondary | ICD-10-CM | POA: Diagnosis not present

## 2024-04-08 DIAGNOSIS — H25813 Combined forms of age-related cataract, bilateral: Secondary | ICD-10-CM

## 2024-04-08 DIAGNOSIS — H35033 Hypertensive retinopathy, bilateral: Secondary | ICD-10-CM

## 2024-04-08 DIAGNOSIS — I10 Essential (primary) hypertension: Secondary | ICD-10-CM | POA: Diagnosis not present

## 2024-04-08 DIAGNOSIS — H353122 Nonexudative age-related macular degeneration, left eye, intermediate dry stage: Secondary | ICD-10-CM

## 2024-04-08 DIAGNOSIS — E119 Type 2 diabetes mellitus without complications: Secondary | ICD-10-CM | POA: Diagnosis not present

## 2024-04-08 MED ORDER — BEVACIZUMAB CHEMO INJECTION 1.25MG/0.05ML SYRINGE FOR KALEIDOSCOPE
1.2500 mg | INTRAVITREAL | Status: AC | PRN
  Administered 2024-04-08: 1.25 mg via INTRAVITREAL

## 2024-04-18 ENCOUNTER — Ambulatory Visit (INDEPENDENT_AMBULATORY_CARE_PROVIDER_SITE_OTHER): Payer: Medicare Other

## 2024-04-18 DIAGNOSIS — I441 Atrioventricular block, second degree: Secondary | ICD-10-CM | POA: Diagnosis not present

## 2024-04-21 LAB — CUP PACEART REMOTE DEVICE CHECK
Battery Remaining Longevity: 81 mo
Battery Voltage: 2.98 V
Brady Statistic AP VP Percent: 32.65 %
Brady Statistic AP VS Percent: 0.81 %
Brady Statistic AS VP Percent: 62.52 %
Brady Statistic AS VS Percent: 4.02 %
Brady Statistic RA Percent Paced: 33.46 %
Brady Statistic RV Percent Paced: 95.17 %
Date Time Interrogation Session: 20250814224351
Implantable Lead Connection Status: 753985
Implantable Lead Connection Status: 753985
Implantable Lead Implant Date: 20210711
Implantable Lead Implant Date: 20210711
Implantable Lead Location: 753859
Implantable Lead Location: 753860
Implantable Lead Model: 5076
Implantable Lead Model: 5076
Implantable Pulse Generator Implant Date: 20210711
Lead Channel Impedance Value: 323 Ohm
Lead Channel Impedance Value: 361 Ohm
Lead Channel Impedance Value: 361 Ohm
Lead Channel Impedance Value: 513 Ohm
Lead Channel Pacing Threshold Amplitude: 0.625 V
Lead Channel Pacing Threshold Amplitude: 0.75 V
Lead Channel Pacing Threshold Pulse Width: 0.4 ms
Lead Channel Pacing Threshold Pulse Width: 0.4 ms
Lead Channel Sensing Intrinsic Amplitude: 3.625 mV
Lead Channel Sensing Intrinsic Amplitude: 3.625 mV
Lead Channel Sensing Intrinsic Amplitude: 4.125 mV
Lead Channel Sensing Intrinsic Amplitude: 4.125 mV
Lead Channel Setting Pacing Amplitude: 1.5 V
Lead Channel Setting Pacing Amplitude: 2.5 V
Lead Channel Setting Pacing Pulse Width: 0.4 ms
Lead Channel Setting Sensing Sensitivity: 1.2 mV
Zone Setting Status: 755011
Zone Setting Status: 755011

## 2024-04-24 ENCOUNTER — Ambulatory Visit: Payer: Self-pay | Admitting: Cardiology

## 2024-05-26 ENCOUNTER — Ambulatory Visit: Admitting: Family Medicine

## 2024-05-26 ENCOUNTER — Encounter: Payer: Self-pay | Admitting: Family Medicine

## 2024-05-26 VITALS — BP 140/90 | HR 60 | Temp 98.0°F | Resp 16 | Wt 145.6 lb

## 2024-05-26 DIAGNOSIS — G4700 Insomnia, unspecified: Secondary | ICD-10-CM | POA: Diagnosis not present

## 2024-05-26 DIAGNOSIS — I10 Essential (primary) hypertension: Secondary | ICD-10-CM | POA: Diagnosis not present

## 2024-05-26 DIAGNOSIS — Z23 Encounter for immunization: Secondary | ICD-10-CM | POA: Diagnosis not present

## 2024-05-26 DIAGNOSIS — F419 Anxiety disorder, unspecified: Secondary | ICD-10-CM

## 2024-05-26 DIAGNOSIS — M65322 Trigger finger, left index finger: Secondary | ICD-10-CM | POA: Diagnosis not present

## 2024-05-26 DIAGNOSIS — E039 Hypothyroidism, unspecified: Secondary | ICD-10-CM | POA: Diagnosis not present

## 2024-05-26 DIAGNOSIS — R7303 Prediabetes: Secondary | ICD-10-CM | POA: Diagnosis not present

## 2024-05-26 DIAGNOSIS — M25511 Pain in right shoulder: Secondary | ICD-10-CM

## 2024-05-26 LAB — BASIC METABOLIC PANEL WITH GFR
BUN: 18 mg/dL (ref 6–23)
CO2: 29 meq/L (ref 19–32)
Calcium: 9.8 mg/dL (ref 8.4–10.5)
Chloride: 100 meq/L (ref 96–112)
Creatinine, Ser: 0.7 mg/dL (ref 0.40–1.20)
GFR: 87.55 mL/min (ref >60.00)
Glucose, Bld: 94 mg/dL (ref 70–99)
Potassium: 4.1 meq/L (ref 3.5–5.1)
Sodium: 140 meq/L (ref 135–145)

## 2024-05-26 LAB — POCT GLYCOSYLATED HEMOGLOBIN (HGB A1C): Hemoglobin A1C: 6.1 % — AB (ref 4.0–5.6)

## 2024-05-26 LAB — TSH: TSH: 0.42 u[IU]/mL (ref 0.35–5.50)

## 2024-05-26 LAB — T4, FREE: Free T4: 1.01 ng/dL (ref 0.60–1.60)

## 2024-05-26 LAB — C-REACTIVE PROTEIN: CRP: <1 mg/dL (ref 0.5–20.0)

## 2024-05-26 MED ORDER — DICLOFENAC SODIUM 1 % EX GEL: 2.0000 g | Freq: Four times a day (QID) | CUTANEOUS | 2 refills | Status: AC

## 2024-05-26 MED ORDER — TIZANIDINE HCL 4 MG PO TABS: 4.0000 mg | ORAL_TABLET | Freq: Every evening | ORAL | 0 refills | Status: AC | PRN

## 2024-05-26 MED ORDER — TRAZODONE HCL 50 MG PO TABS: 50.0000 mg | ORAL_TABLET | Freq: Every day | ORAL | 1 refills | Status: AC

## 2024-05-26 NOTE — Assessment & Plan Note (Signed)
 BP elevated today, she is not monitoring BP at home, recommend doing so. For now continue losartan  50 mg daily. If BP persistently elevated at home, instructed to let me know. Continue low-salt diet. She has an appointment with cardiologist 07/18/2024.

## 2024-05-26 NOTE — Assessment & Plan Note (Signed)
 Last TSH 5.3 in 05/2023. Continue levothyroxine  75 mcg daily. Further recommendations will be given according to TSI result.

## 2024-05-26 NOTE — Assessment & Plan Note (Signed)
 Hemoglobin A1c improved, it went from 6.4 to 6.1. Encourage consistency with a healthy lifestyle for diabetes prevention.
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
## 2024-05-26 NOTE — Patient Instructions (Addendum)
 A few things to remember from today's visit:  Acute pain of right shoulder - Plan: DG Shoulder Right, tiZANidine  (ZANAFLEX ) 4 MG tablet, diclofenac  Sodium (VOLTAREN ) 1 % GEL  Prediabetes  Essential (primary) hypertension  Trigger index finger of left hand - Plan: Ambulatory referral to Sports Medicine  Midase la presion arterial, idealmente debe ser 130/80, almenos debajo de 140/90.  El dolor de hombro parece artritis.  Voltaren  gel 3-4 veces at dia en el hombro derecho. Zanaflex  en la noche por 2 semanas despeues cuando lo necesite.  X ray viernes.  If you need refills for medications you take chronically, please call your pharmacy. Do not use My Chart to request refills or for acute issues that need immediate attention. If you send a my chart message, it may take a few days to be addressed, specially if I am not in the office.  Please be sure medication list is accurate. If a new problem present, please set up appointment sooner than planned today.

## 2024-05-26 NOTE — Assessment & Plan Note (Signed)
 Problem has improved with trazodone  50 mg, she is taking 1 tablet at bedtime, she has tolerated well. Continue good sleep hygiene.

## 2024-05-28 NOTE — Progress Notes (Signed)
 Remote PPM Transmission

## 2024-05-29 ENCOUNTER — Ambulatory Visit: Payer: Self-pay | Admitting: Family Medicine

## 2024-05-29 NOTE — Assessment & Plan Note (Signed)
 Stable overall. Continue Trazodone  50 mg daily at bedtime, which also helps with insomnia.

## 2024-05-30 ENCOUNTER — Other Ambulatory Visit

## 2024-05-30 ENCOUNTER — Ambulatory Visit (INDEPENDENT_AMBULATORY_CARE_PROVIDER_SITE_OTHER)

## 2024-05-30 DIAGNOSIS — M19011 Primary osteoarthritis, right shoulder: Secondary | ICD-10-CM | POA: Diagnosis not present

## 2024-05-30 DIAGNOSIS — M25511 Pain in right shoulder: Secondary | ICD-10-CM | POA: Diagnosis not present

## 2024-06-09 NOTE — Progress Notes (Unsigned)
 Tiffany Berg D.[facility]MENT[facility] [address] [place] [address] Phone: [phone]   Assessment and Plan:     1. Chronic right shoulder pain (Primary) 2. 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  Do not to use additional over-the-counter NSAIDs (ibuprofen , naproxen, Advil , Aleve, etc.) while taking prescription NSAIDs.  Do not recommend long-term NSAID use, and we plan on discontinuing NSAIDs after 3-week course.  May use Tylenol  [phone] mg 2 to 3 times a day for breakthrough pain.  -Start HEP for rotator cuff - Reviewed patient's x-ray in clinic.  Discussed mild AC joint degenerative changes.  No acute fracture or dislocation  15 additional minutes spent for educating Therapeutic Home Exercise Program.  This included exercises focusing on stretching, strengthening, with focus on eccentric aspects.   Long term goals include an improvement in range of motion, strength, endurance as well as avoiding reinjury. Patient's frequency would include in 1-2 times a day, 3-5 times a week for a duration of 6-12 weeks. Proper technique shown and discussed handout in great detail with ATC.  All questions were discussed and answered.  Patient accompanied by interpreter that was used throughout entirety of office visit.  Pertinent previous records reviewed include right shoulder x-ray 05/30/24, family medicine note 05/26/2024   Follow Up: 5 to 6 weeks for reevaluation.  Could consider subacromial CSI versus physical therapy   Subjective:   I, Dennette Faulconer, am serving as a Neurosurgeon for Doctor Morene Mace  Chief Complaint: right shoulder pain  HPI:   06/10/2024 Patient is a 68 year old female with right shoulder pain. Patient  states pain started 2 months ago. Decreased ROM. She was dx with arthritis. Tizanidine  for the pain and that helps a little. Pain radiates to the upper trap.    Relevant Historical Information: Hypertension, history of AV block, prediabetes  Additional pertinent review of systems negative.   Current Outpatient Medications:    meloxicam  (MOBIC ) 15 MG tablet, Take 1 tablet (15 mg total) by mouth daily., Disp: 21 tablet, Rfl: 0   aspirin  EC 81 MG tablet, Take 81 mg by mouth daily. Swallow whole., Disp: , Rfl:    B Complex Vitamins (VITAMIN B COMPLEX PO), Take 1 tablet by mouth daily., Disp: , Rfl:    Cyanocobalamin (VITAMIN B 12) 100 MCG LOZG, Take by mouth., Disp: , Rfl:    diclofenac  Sodium (VOLTAREN ) 1 % GEL, Apply 2 g topically 4 (four) times daily., Disp: 150 g, Rfl: 2   doxylamine, Sleep, (UNISOM) 25 MG tablet, Take 25 mg by mouth at bedtime as needed. As need, Disp: , Rfl:    fluticasone  (FLONASE ) 50 MCG/ACT nasal spray, Place 1 spray into both nostrils 2 (two) times daily., Disp: 16 g, Rfl: 0   levothyroxine  (SYNTHROID ) 75 MCG tablet, Take 1 tablet (75 mcg total) by mouth daily before breakfast., Disp: 90 tablet, Rfl: 3   losartan  (COZAAR ) 50 MG tablet, Take 1 tablet (50 mg total) by mouth daily., Disp: 30 tablet, Rfl: 11   Magnesium  200 MG TABS, Take by mouth daily at 6 (six) AM., Disp: , Rfl:    Omega 3 1000 MG CAPS, Take 1,000 mg by mouth daily., Disp: , Rfl:    omeprazole  (PRILOSEC) 40 MG capsule, Take 1 capsule (40  mg total) by mouth daily., Disp: 60 capsule, Rfl: 0   OVER THE COUNTER MEDICATION, Vitamin D 3 1000 mg, Disp: , Rfl:    rosuvastatin  (CRESTOR ) 10 MG tablet, Take 1 tablet (10 mg total) by mouth daily., Disp: 90 tablet, Rfl: 3   tiZANidine  (ZANAFLEX ) 4 MG tablet, Take 1 tablet (4 mg total) by mouth at bedtime as needed for muscle spasms., Disp: 30 tablet, Rfl: 0   traZODone  (DESYREL ) 50 MG tablet, Take 1 tablet (50 mg total) by mouth at bedtime., Disp: 90 tablet, Rfl: 1    Objective:     Vitals:   06/10/24 1005  BP: 108/78  Pulse: 73  SpO2: 97%  Weight: 147 lb (66.7 kg)  Height: 5' 3 (1.6 m)      Body mass index is 26.04 kg/m.    Physical Exam:    Gen: Appears well, nad, nontoxic and pleasant Neuro:sensation intact, strength is 5/5 with df/pf/inv/ev, muscle tone wnl Skin: no suspicious lesion or defmority Psych: A&O, appropriate mood and affect   Right Shoulder:  No deformity, swelling or muscle wasting No scapular winging FF 180, abd 180, int 0, ext 90 with painful arc at end range in all directions NTTP over the [place], clavicle, ac, coracoid, biceps groove, humerus, deltoid, trapezius, cervical spine Positive neer, hawkins, empty can, obriens Negative crossarm, subscap liftoff, speeds Neg ant drawer, sulcus sign, apprehension Negative Spurling's test bilat FROM of neck    Electronically signed by:  Odis Mace D.[facility] 10:36 AM 06/10/24

## 2024-06-10 ENCOUNTER — Ambulatory Visit: Admitting: Sports Medicine

## 2024-06-10 VITALS — BP 108/78 | HR 73 | Ht 63.0 in | Wt 147.0 lb

## 2024-06-10 DIAGNOSIS — G8929 Other chronic pain: Secondary | ICD-10-CM

## 2024-06-10 DIAGNOSIS — M25511 Pain in right shoulder: Secondary | ICD-10-CM

## 2024-06-10 DIAGNOSIS — M7551 Bursitis of right shoulder: Secondary | ICD-10-CM

## 2024-06-10 MED ORDER — MELOXICAM 15 MG PO TABS: 15.0000 mg | ORAL_TABLET | Freq: Every day | ORAL | 0 refills | Status: AC

## 2024-06-10 NOTE — Patient Instructions (Signed)
-   Start meloxicam  15 mg daily x3 weeks.  May use remaining NSAID as needed once daily for pain control.  Do not to use additional over-the-counter NSAIDs (ibuprofen , naproxen, Advil , Aleve, etc.) while taking prescription NSAIDs.  May use Tylenol  [phone] mg 2 to 3 times a day for breakthrough pain.  Shoulder HEP   5-6 week follow up

## 2024-06-19 NOTE — Progress Notes (Shared)
 [facility] - Clinic Note  07/01/2024   CHIEF COMPLAINT Patient presents for No chief complaint on file.  HISTORY OF PRESENT ILLNESS: Tiffany Berg is a 68 y.o. female who presents to the clinic today for:     Pt states her vision is good, she had a harder time reading the eye chart with her left eye today  Referring physician: Fleeta Zerita DASEN, MD [address] [place],  [place] [address]  HISTORICAL INFORMATION:  Selected notes from the MEDICAL RECORD NUMBER Referred by Dr. Fleeta for ARMD eval LEE:  Ocular Hx- PMH-   CURRENT MEDICATIONS: No current outpatient medications on file. (Ophthalmic Drugs)   No current facility-administered medications for this visit. (Ophthalmic Drugs)   Current Outpatient Medications (Other)  Medication Sig   aspirin  EC 81 MG tablet Take 81 mg by mouth daily. Swallow whole.   B Complex Vitamins (VITAMIN B COMPLEX PO) Take 1 tablet by mouth daily.   Cyanocobalamin (VITAMIN B 12) 100 MCG LOZG Take by mouth.   diclofenac  Sodium (VOLTAREN ) 1 % GEL Apply 2 g topically 4 (four) times daily.   doxylamine, Sleep, (UNISOM) 25 MG tablet Take 25 mg by mouth at bedtime as needed. As need   fluticasone  (FLONASE ) 50 MCG/ACT nasal spray Place 1 spray into both nostrils 2 (two) times daily.   levothyroxine  (SYNTHROID ) 75 MCG tablet Take 1 tablet (75 mcg total) by mouth daily before breakfast.   losartan  (COZAAR ) 50 MG tablet Take 1 tablet (50 mg total) by mouth daily.   Magnesium  200 MG TABS Take by mouth daily at 6 (six) AM.   meloxicam  (MOBIC ) 15 MG tablet Take 1 tablet (15 mg total) by mouth daily.   Omega 3 1000 MG CAPS Take 1,000 mg by mouth daily.   omeprazole  (PRILOSEC) 40 MG capsule Take 1 capsule (40 mg total) by mouth daily.   OVER THE COUNTER MEDICATION Vitamin D 3 1000 mg   rosuvastatin  (CRESTOR ) 10 MG tablet Take 1 tablet (10 mg total) by mouth daily.   tiZANidine  (ZANAFLEX ) 4 MG tablet Take 1 tablet (4 mg total) by mouth  at bedtime as needed for muscle spasms.   traZODone  (DESYREL ) 50 MG tablet Take 1 tablet (50 mg total) by mouth at bedtime.   No current facility-administered medications for this visit. 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   w/ symptomatic bradycardia, rate 30s;  s/p  PPM 03-14-2020   Myalgia, multiple sites    Pre-diabetes    Symptomatic bradycardia 03/12/2020   s/p PPM   Wears glasses    Wears partial dentures    upper and lower   Past Surgical History:  Procedure  Laterality Date   KATRINA OSTEOTOMY Right 10/15/2020   Procedure: KATRINA OSTEOTOMY;  Surgeon: Silva Juliene SAUNDERS, DPM;  Location: [facility];  Service: Podiatry;  Laterality: Right;  REGIONAL BLOCK   AIKEN OSTEOTOMY Left 02/24/2022   Procedure: KATRINA OSTEOTOMY;  Surgeon: Silva Juliene SAUNDERS, DPM;  Location: [facility];  Service: Podiatry;  Laterality: Left;   ANTERIOR AND POSTERIOR REPAIR N/A 02/18/2015   Procedure: ANTERIOR (CYSTOCELE) AND POSTERIOR REPAIR (RECTOCELE);  Surgeon: Jon Rummer, MD;  Location: [facility];  Service: Gynecology;  Laterality: N/A;   BLADDER SUSPENSION N/A 02/18/2015   Procedure: TRANSVAGINAL TAPE (TVT) PROCEDURE;  Surgeon: Jon Rummer,  MD;  Location: [facility];  Service: Gynecology;  Laterality: N/A;   COLONOSCOPY     pt states she might have had one many years ago   CYSTOSCOPY Bilateral 02/18/2015   Procedure: CYSTOSCOPY;  Surgeon: Jon Rummer, MD;  Location: [facility];  Service: Gynecology;  Laterality: Bilateral;   GRAFT APPLICATION Left 02/24/2022   Procedure: BONE GRAFT;  Surgeon: Silva Juliene SAUNDERS, DPM;  Location: [facility] [place];  Service: Podiatry;  Laterality: Left;   HALLUX VALGUS LAPIDUS Right 10/15/2020   Procedure: HALLUX VALGUS LAPIDUS AND BONE GRAFT;  Surgeon: Silva Juliene SAUNDERS, DPM;  Location: [facility] [place];  Service: Podiatry;  Laterality: Right;  WITH IV SEDATION; REGIONAL BLOCK   HALLUX VALGUS LAPIDUS Left 02/24/2022   Procedure: HALLUX VALGUS LAPIDUS;  Surgeon: Silva Juliene SAUNDERS, DPM;  Location: [facility];  Service: Podiatry;  Laterality: Left;   HAMMER TOE SURGERY Right 10/15/2020   Procedure: HAMMER TOE CORRECTION;  Surgeon: Silva Juliene SAUNDERS, DPM;  Location: [facility];  Service: Podiatry;  Laterality: Right;  REGIONAL BLOCK   HAMMER TOE SURGERY Left 02/24/2022   Procedure: HAMMER TOE CORRECTION SECOND TOE LEFT FOOT;  Surgeon: Silva Juliene SAUNDERS, DPM;  Location: [facility] [place];  Service: Podiatry;  Laterality: Left;   LAPAROSCOPIC ASSISTED VAGINAL HYSTERECTOMY N/A 02/18/2015   Procedure: LAPAROSCOPIC ASSISTED VAGINAL HYSTERECTOMY;  Surgeon: Jon Rummer, MD;  Location: [facility];  Service: Gynecology;  Laterality: N/A;   MASTECTOMY, PARTIAL Left 08-12-2013  @[facility]    excision another site   PACEMAKER IMPLANT N/A 03/14/2020   Procedure: PACEMAKER IMPLANT;  Surgeon: Inocencio Soyla Lunger, MD;  Location: [facility];  Service: Cardiovascular;  Laterality: N/A;   RADIOACTIVE SEED GUIDED PARTIAL MASTECTOMY/AXILLARY SENTINEL NODE BIOPSY/AXILLARY NODE DISSECTION Left 07-22-2013  @[facility]    SALPINGOOPHORECTOMY Bilateral 02/18/2015   Procedure:  BILATERAL SALPINGO OOPHORECTOMY;  Surgeon: Jon Rummer, MD;  Location: [facility];  Service: Gynecology;  Laterality: Bilateral;   FAMILY HISTORY Family History  Problem Relation Age of Onset   Heart disease Mother    Colon cancer Father    Cancer Father        prostata   Stomach cancer Sister    Cancer Sister 7       Breast   Esophageal cancer Neg Hx    Rectal cancer Neg Hx    SOCIAL HISTORY Social History   Tobacco Use   Smoking status: Never   Smokeless tobacco: Never  Vaping Use   Vaping status: Never Used  Substance Use Topics   Alcohol use: Yes    Comment: socially   Drug use: Never       OPHTHALMIC EXAM:  Not recorded    IMAGING AND PROCEDURES  Imaging and Procedures for 07/01/2024           ASSESSMENT/PLAN: No  diagnosis found.  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 Age related macular degeneration, non-exudative, OS  - The incidence, anatomy, and pathology of dry AMD, risk of progression, and the AREDS and AREDS 2 study including smoking risks discussed with patient.  - Recommend amsler grid monitoring  - monitor  3. Diabetes mellitus, type 2 without retinopathy  - A1c: 6.4 on 09.27.24 - The incidence, risk factors for progression, natural history and treatment options for diabetic retinopathy  were discussed with patient.   - The need for close monitoring of blood glucose, blood pressure, and  serum lipids, avoiding cigarette or any type of tobacco, and the need for long term follow up was also discussed with patient. - f/u in 1 year, sooner prn  4,5. Hypertensive retinopathy OU - discussed importance of tight BP control - monitor  6. Mixed Cataract OU - The symptoms of cataract, surgical options, and treatments and risks were discussed with patient. - discussed diagnosis and progression - monitor   Ophthalmic Meds Ordered this visit:  No orders of the defined types were placed in this encounter.    No follow-ups on file.  There are no Patient Instructions on file for this visit.  Explained the diagnoses, plan, and follow up with the patient and they expressed understanding.  Patient expressed understanding of the importance of proper follow up care.  This document serves as a record of services personally performed by Redell JUDITHANN Hans, MD, PhD. It was created on their behalf by Wanda GEANNIE Keens, COT an ophthalmic technician. The creation of this record is the provider's dictation and/or activities during the visit.    Electronically signed by:  Wanda GEANNIE Keens, COT  06/19/24 7:33 AM   Redell JUDITHANN Hans, M.D., Ph.D. Diseases & Surgery of the Retina and Vitreous [facility]    Abbreviations: M myopia (nearsighted); A astigmatism; H hyperopia (farsighted); P presbyopia; Mrx spectacle prescription;  CTL contact lenses; OD right eye; OS left eye; OU both eyes  XT exotropia; ET esotropia; PEK punctate epithelial keratitis; PEE punctate epithelial erosions; DES dry eye syndrome; MGD meibomian gland dysfunction; ATs artificial tears; PFAT's preservative free artificial tears; NSC nuclear sclerotic cataract; PSC posterior subcapsular cataract; ERM epi-retinal membrane; PVD posterior vitreous detachment; RD retinal detachment; DM diabetes mellitus; DR diabetic retinopathy; NPDR non-proliferative diabetic retinopathy; PDR proliferative diabetic  retinopathy; CSME clinically significant macular edema; DME diabetic macular edema; dbh dot blot hemorrhages; CWS cotton wool spot; POAG primary open angle glaucoma; C/D cup-to-disc ratio; HVF humphrey visual field; GVF goldmann visual field; OCT optical coherence tomography; IOP intraocular pressure; BRVO Branch retinal vein occlusion; CRVO central retinal vein occlusion; CRAO central retinal artery occlusion; BRAO branch retinal artery occlusion; RT retinal tear; SB scleral buckle; PPV pars plana vitrectomy; VH Vitreous hemorrhage; PRP panretinal laser photocoagulation; IVK intravitreal kenalog; VMT vitreomacular traction; MH Macular hole;  NVD neovascularization of the disc; NVE neovascularization elsewhere; AREDS age related eye disease study; ARMD age related macular degeneration; POAG primary open angle glaucoma; EBMD epithelial/anterior basement membrane dystrophy; ACIOL anterior chamber intraocular lens; IOL intraocular lens; PCIOL posterior chamber intraocular lens; Phaco/IOL phacoemulsification with intraocular lens placement; PRK photorefractive keratectomy; LASIK laser assisted in situ keratomileusis; HTN hypertension; DM diabetes mellitus; COPD chronic obstructive pulmonary disease

## 2024-06-25 NOTE — Progress Notes (Signed)
 Tiffany Berg                                          MRN: [id number]   06/25/2024   The [facility] Quality Team Specialist reviewed this patient medical record for the purposes of chart review for care gap closure. The following were reviewed: abstraction for care gap closure-glycemic status assessment. Chart reviewed for KED labs as well.    [facility] Quality Team

## 2024-07-01 ENCOUNTER — Other Ambulatory Visit: Payer: Self-pay

## 2024-07-01 ENCOUNTER — Encounter (HOSPITAL_COMMUNITY): Payer: Self-pay

## 2024-07-01 ENCOUNTER — Emergency Department (HOSPITAL_COMMUNITY)

## 2024-07-01 ENCOUNTER — Encounter (INDEPENDENT_AMBULATORY_CARE_PROVIDER_SITE_OTHER): Admitting: Ophthalmology

## 2024-07-01 ENCOUNTER — Emergency Department (HOSPITAL_COMMUNITY)
Admission: EM | Admit: 2024-07-01 | Discharge: 2024-07-01 | Disposition: A | Discharge status: Home / Self Care | Attending: Emergency Medicine | Admitting: Emergency Medicine

## 2024-07-01 ENCOUNTER — Ambulatory Visit: Payer: Self-pay

## 2024-07-01 DIAGNOSIS — E039 Hypothyroidism, unspecified: Secondary | ICD-10-CM | POA: Diagnosis not present

## 2024-07-01 DIAGNOSIS — Z86000 Personal history of in-situ neoplasm of breast: Secondary | ICD-10-CM | POA: Diagnosis not present

## 2024-07-01 DIAGNOSIS — I10 Essential (primary) hypertension: Secondary | ICD-10-CM

## 2024-07-01 DIAGNOSIS — K8050 Calculus of bile duct without cholangitis or cholecystitis without obstruction: Secondary | ICD-10-CM | POA: Diagnosis not present

## 2024-07-01 DIAGNOSIS — Z7982 Long term (current) use of aspirin: Secondary | ICD-10-CM | POA: Insufficient documentation

## 2024-07-01 DIAGNOSIS — R109 Unspecified abdominal pain: Secondary | ICD-10-CM | POA: Diagnosis present

## 2024-07-01 DIAGNOSIS — H35033 Hypertensive retinopathy, bilateral: Secondary | ICD-10-CM

## 2024-07-01 DIAGNOSIS — K5732 Diverticulitis of large intestine without perforation or abscess without bleeding: Secondary | ICD-10-CM | POA: Insufficient documentation

## 2024-07-01 DIAGNOSIS — Z950 Presence of cardiac pacemaker: Secondary | ICD-10-CM | POA: Diagnosis not present

## 2024-07-01 DIAGNOSIS — K5792 Diverticulitis of intestine, part unspecified, without perforation or abscess without bleeding: Secondary | ICD-10-CM

## 2024-07-01 DIAGNOSIS — E119 Type 2 diabetes mellitus without complications: Secondary | ICD-10-CM

## 2024-07-01 DIAGNOSIS — H353211 Exudative age-related macular degeneration, right eye, with active choroidal neovascularization: Secondary | ICD-10-CM

## 2024-07-01 DIAGNOSIS — H353122 Nonexudative age-related macular degeneration, left eye, intermediate dry stage: Secondary | ICD-10-CM

## 2024-07-01 DIAGNOSIS — H25813 Combined forms of age-related cataract, bilateral: Secondary | ICD-10-CM

## 2024-07-01 DIAGNOSIS — K8020 Calculus of gallbladder without cholecystitis without obstruction: Secondary | ICD-10-CM | POA: Diagnosis not present

## 2024-07-01 LAB — URINALYSIS, ROUTINE W REFLEX MICROSCOPIC
Bilirubin Urine: NEGATIVE
Glucose, UA: NEGATIVE mg/dL
Ketones, ur: NEGATIVE mg/dL
Nitrite: NEGATIVE
Protein, ur: NEGATIVE mg/dL
Specific Gravity, Urine: 1.004 — ABNORMAL LOW (ref 1.005–1.030)
pH: 7 (ref 5.0–8.0)

## 2024-07-01 LAB — CBC
HCT: 42.7 % (ref 36.0–46.0)
Hemoglobin: 14.4 g/dL (ref 12.0–15.0)
MCH: 30.2 pg (ref 26.0–34.0)
MCHC: 33.7 g/dL (ref 30.0–36.0)
MCV: 89.5 fL (ref 80.0–100.0)
Platelets: 321 K/uL (ref 150–400)
RBC: 4.77 MIL/uL (ref 3.87–5.11)
RDW: 13 % (ref 11.5–15.5)
WBC: 7.3 K/uL (ref 4.0–10.5)
nRBC: 0 % (ref 0.0–0.2)

## 2024-07-01 LAB — COMPREHENSIVE METABOLIC PANEL WITH GFR
ALT: 22 U/L (ref 0–44)
AST: 26 U/L (ref 15–41)
Albumin: 3.9 g/dL (ref 3.5–5.0)
Alkaline Phosphatase: 87 U/L (ref 38–126)
Anion gap: 11 (ref 5–15)
BUN: 15 mg/dL (ref 8–23)
CO2: 27 mmol/L (ref 22–32)
Calcium: 9.4 mg/dL (ref 8.9–10.3)
Chloride: 101 mmol/L (ref 98–111)
Creatinine, Ser: 0.6 mg/dL (ref 0.44–1.00)
GFR, Estimated: >60 mL/min (ref >60)
Glucose, Bld: 108 mg/dL — ABNORMAL HIGH (ref 70–99)
Potassium: 4.2 mmol/L (ref 3.5–5.1)
Sodium: 139 mmol/L (ref 135–145)
Total Bilirubin: 0.6 mg/dL (ref 0.0–1.2)
Total Protein: 7.5 g/dL (ref 6.5–8.1)

## 2024-07-01 LAB — POC OCCULT BLOOD, ED: Fecal Occult Bld: NEGATIVE

## 2024-07-01 LAB — LIPASE, BLOOD: Lipase: 32 U/L (ref 11–51)

## 2024-07-01 MED ORDER — IOHEXOL 350 MG/ML SOLN
75.0000 mL | Freq: Once | INTRAVENOUS | Status: AC | PRN
  Administered 2024-07-01: 75 mL via INTRAVENOUS

## 2024-07-01 MED ORDER — AMOXICILLIN-POT CLAVULANATE 875-125 MG PO TABS: 1.0000 | ORAL_TABLET | Freq: Two times a day (BID) | ORAL | 0 refills | Status: AC

## 2024-07-01 NOTE — ED Triage Notes (Signed)
 Pt reports all over abdominal pain x 1 week associated with black stools. Denies n/v/d.

## 2024-07-01 NOTE — Telephone Encounter (Signed)
  FYI Only or Action Required?: Action required by provider: update on patient condition.  Patient was last seen in primary care on 05/26/2024 by Jordan, Betty G, MD.  Called Nurse Triage reporting Abdominal Pain.  Symptoms began a week ago.  Interventions attempted: OTC medications: pepto bismol.  Symptoms are: gradually worsening.  Triage Disposition: Go to ED Now (Notify PCP)  Patient/caregiver understands and will follow disposition?: Yes Copied from CRM [id number]. Topic: Clinical - Red Word Triage >> Jul 01, 2024  2:22 PM Alexandria E wrote: [facility] that prompted transfer to Nurse Triage: Stomach pain going on for 1 week. Lose stools. Daughter [employer] on the line. Reason for Disposition  Black or tarry bowel movements  (Exception: Chronic-unchanged black-grey BMs AND is taking iron pills or Pepto-Bismol.)  Answer Assessment - Initial Assessment Questions 1. LOCATION: Where does it hurt?      Entire stomach 2. RADIATION: Does the pain shoot anywhere else? (e.g., chest, back)     Denies radiation 3. ONSET: When did the pain begin? (e.g., minutes, hours or days ago)      One week  5. PATTERN Does the pain come and go, or is it constant?     Comes and goes some days, some days, pain is constant 6. SEVERITY: How bad is the pain?  (e.g., Scale 1-10; mild, moderate, or severe)     9/10 7. RECURRENT SYMPTOM: Have you ever had this type of stomach pain before? If Yes, ask: When was the last time? and What happened that time?      denies 8. CAUSE: What do you think is causing the stomach pain? (e.g., gallstones, recent abdominal surgery)     unknown 9. RELIEVING/AGGRAVATING FACTORS: What makes it better or worse? (e.g., antacids, bending or twisting motion, bowel movement)     Has taken pepto bismol but has only given minimal relief 10. OTHER SYMPTOMS: Do you have any other symptoms? (e.g., back pain, diarrhea, fever, urination pain, vomiting)       Loose  stools soft and black stool x one week  Protocols used: Abdominal Pain - Female-A-AH

## 2024-07-01 NOTE — ED Provider Notes (Signed)
 [place] EMERGENCY DEPARTMENT AT [facility] Provider Note   CSN: [id number] Arrival date & time: 07/01/24  1745     Patient presents with: Abdominal Pain   Tiffany Berg is a 68 y.o. female.    Abdominal Pain Patient speak Spanish primarily.  Education administrator used with Domingo being engineer, maintenance.  Patient has had abdominal pain for about a week.  Entire abdomen but also lower abdomen.  States stools been black.  No fevers.  Has had nausea.  Pain has been worse after eating.    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   w/ symptomatic bradycardia, rate 30s;  s/p  PPM 03-14-2020   Myalgia, multiple sites    Pre-diabetes    Symptomatic bradycardia 03/12/2020   s/p PPM   Wears glasses    Wears partial dentures    upper and lower   Past Surgical History:  Procedure Laterality Date   KATRINA OSTEOTOMY Right 10/15/2020   Procedure: KATRINA OSTEOTOMY;  Surgeon: Silva Juliene SAUNDERS, DPM;  Location: [facility];  Service: Podiatry;  Laterality: Right;  REGIONAL BLOCK   AIKEN OSTEOTOMY Left 02/24/2022   Procedure: KATRINA OSTEOTOMY;  Surgeon: Silva Juliene SAUNDERS, DPM;  Location: [facility];  Service: Podiatry;  Laterality: Left;   ANTERIOR AND POSTERIOR REPAIR N/A 02/18/2015   Procedure: ANTERIOR  (CYSTOCELE) AND POSTERIOR REPAIR (RECTOCELE);  Surgeon: Jon Rummer, MD;  Location: [facility];  Service: Gynecology;  Laterality: N/A;   BLADDER SUSPENSION N/A 02/18/2015   Procedure: TRANSVAGINAL TAPE (TVT) PROCEDURE;  Surgeon: Jon Rummer, MD;  Location: [facility];  Service: Gynecology;  Laterality: N/A;   COLONOSCOPY     pt states she might have had one many years ago   CYSTOSCOPY Bilateral 02/18/2015   Procedure: CYSTOSCOPY;  Surgeon: Jon Rummer, MD;  Location: [facility];  Service: Gynecology;  Laterality: Bilateral;   GRAFT APPLICATION Left 02/24/2022   Procedure: BONE GRAFT;  Surgeon: Silva Juliene SAUNDERS, DPM;  Location: [facility];  Service: Podiatry;  Laterality: Left;   HALLUX VALGUS LAPIDUS Right 10/15/2020   Procedure: HALLUX VALGUS LAPIDUS AND BONE GRAFT;  Surgeon: Silva Juliene SAUNDERS, DPM;  Location: [facility] [place];  Service: Podiatry;  Laterality: Right;  WITH IV SEDATION; REGIONAL BLOCK   HALLUX VALGUS LAPIDUS Left 02/24/2022   Procedure: HALLUX VALGUS LAPIDUS;  Surgeon: Silva Juliene SAUNDERS, DPM;  Location: [facility] [place];  Service: Podiatry;  Laterality: Left;   HAMMER TOE SURGERY Right 10/15/2020   Procedure: HAMMER TOE CORRECTION;  Surgeon: Silva Juliene SAUNDERS, DPM;  Location: [facility];  Service: Podiatry;  Laterality: Right;  REGIONAL BLOCK   HAMMER TOE SURGERY Left 02/24/2022   Procedure: HAMMER TOE CORRECTION SECOND TOE LEFT FOOT;  Surgeon: Silva Juliene SAUNDERS,  DPM;  Location: [facility];  Service: Podiatry;  Laterality: Left;   LAPAROSCOPIC ASSISTED VAGINAL HYSTERECTOMY N/A 02/18/2015   Procedure: LAPAROSCOPIC ASSISTED VAGINAL HYSTERECTOMY;  Surgeon: Jon Rummer, MD;  Location: [facility];  Service: Gynecology;  Laterality: N/A;   MASTECTOMY, PARTIAL Left 08-12-2013  @[facility]    excision another site   PACEMAKER IMPLANT N/A 03/14/2020   Procedure: PACEMAKER IMPLANT;  Surgeon: Inocencio Soyla Lunger, MD;  Location: [facility]  INVASIVE CV LAB;  Service: Cardiovascular;  Laterality: N/A;   RADIOACTIVE SEED GUIDED PARTIAL MASTECTOMY/AXILLARY SENTINEL NODE BIOPSY/AXILLARY NODE DISSECTION Left 07-22-2013  @[facility]    SALPINGOOPHORECTOMY Bilateral 02/18/2015   Procedure: BILATERAL SALPINGO OOPHORECTOMY;  Surgeon: Jon Rummer, MD;  Location: [facility];  Service: Gynecology;  Laterality: Bilateral;     Prior to Admission medications   Medication Sig Start Date End Date Taking? Authorizing Provider  amoxicillin -clavulanate (AUGMENTIN ) 875-125 MG tablet Take 1 tablet by mouth every 12 (twelve) hours. 07/01/24  Yes Patsey Lot, MD  aspirin  EC 81 MG tablet Take 81 mg by mouth daily. Swallow whole.    [provider]  B Complex Vitamins (VITAMIN B COMPLEX PO) Take 1 tablet by mouth daily.    [provider]  Cyanocobalamin (VITAMIN B 12) 100 MCG LOZG Take by mouth.    [provider]  diclofenac  Sodium (VOLTAREN ) 1 % GEL Apply 2 g topically 4 (four) times daily. 05/26/24   Jordan, Betty G, MD  doxylamine, Sleep, (UNISOM) 25 MG tablet Take 25 mg by mouth at bedtime as needed. As need    [provider]  fluticasone  (FLONASE ) 50 MCG/ACT nasal spray Place 1 spray into both nostrils 2 (two) times daily. 11/07/23   Jordan, Betty G, MD  levothyroxine  (SYNTHROID ) 75 MCG tablet Take 1 tablet (75 mcg total) by mouth daily before breakfast. 06/01/23   Jordan, Betty G, MD  losartan  (COZAAR ) 50 MG tablet Take 1 tablet (50 mg total) by mouth daily. 11/20/23   Aniceto Daphne CROME, NP  Magnesium  200 MG TABS Take by mouth daily at 6 (six) AM.    [provider]  meloxicam  (MOBIC ) 15 MG tablet Take 1 tablet (15 mg total) by mouth daily. 06/10/24   Leonce Katz, DO  Omega 3 1000 MG CAPS Take 1,000 mg by mouth daily.    [provider]  omeprazole  (PRILOSEC) 40 MG capsule Take 1 capsule (40 mg total) by mouth daily. 11/07/23   Jordan, Betty G, MD  OVER THE COUNTER MEDICATION Vitamin D 3 1000 mg     [provider]  rosuvastatin  (CRESTOR ) 10 MG tablet Take 1 tablet (10 mg total) by mouth daily. 06/04/23   Jordan, Betty G, MD  tiZANidine  (ZANAFLEX ) 4 MG tablet Take 1 tablet (4 mg total) by mouth at bedtime as needed for muscle spasms. 05/26/24   Jordan, Betty G, MD  traZODone  (DESYREL ) 50 MG tablet Take 1 tablet (50 mg total) by mouth at bedtime. 05/26/24   Jordan, Betty G, MD    Allergies: Patient has no known allergies.    Review of Systems  Gastrointestinal:  Positive for abdominal pain.    Updated Vital Signs BP (!) 148/71 (BP Location: Right Arm)   Pulse (!) 59   Temp 97.9 F (36.6 C) (Oral)   Resp 16   Ht 5' 3 (1.6 m)   Wt 66.7 kg   SpO2 98%   BMI 26.04 kg/m   Physical Exam Vitals and nursing note reviewed.  Cardiovascular:     Rate  and Rhythm: Normal rate.  Abdominal:     Tenderness: There is abdominal tenderness.     Comments: Lower abdominal tenderness without rebound or guarding.  No hernia palpated.  Neurological:     Mental Status: She is alert.     (all labs ordered are listed, but only abnormal results are displayed) Labs Reviewed  COMPREHENSIVE METABOLIC PANEL WITH GFR - Abnormal; Notable for the following components:      Result Value   Glucose, Bld 108 (*)    All other components within normal limits  URINALYSIS, ROUTINE W REFLEX MICROSCOPIC - Abnormal; Notable for the following components:   Color, Urine STRAW (*)    Specific Gravity, Urine 1.004 (*)    Hgb urine dipstick SMALL (*)    Leukocytes,Ua LARGE (*)    Bacteria, UA MANY (*)    Non Squamous Epithelial 0-5 (*)    All other components within normal limits  LIPASE, BLOOD  CBC  POC OCCULT BLOOD, ED    EKG: None  Radiology: CT ABDOMEN PELVIS W CONTRAST Result Date: 07/01/2024 EXAM: CT ABDOMEN AND PELVIS WITH CONTRAST 07/01/2024 09:34:50 PM TECHNIQUE: CT of the abdomen and pelvis was performed with the administration of 75 mL of iohexol  (OMNIPAQUE ) 350 MG/ML injection.  Multiplanar reformatted images are provided for review. Automated exposure control, iterative reconstruction, and/or weight-based adjustment of the mA/kV was utilized to reduce the radiation dose to as low as reasonably achievable. COMPARISON: CT abdomen and pelvis with iv contrast 02/28/2023 and 12/21/2023. CLINICAL HISTORY: Abdominal pain, acute, nonlocalized. Pt reports all over abdominal pain x 1 week associated with black stools. Denies n/v/d. FINDINGS: LOWER CHEST: Dual lead-based pacemaker wiring in the right heart and mild cardiomegaly. No pericardial effusion. Mild scattered calcific plaque in the LAD coronary artery. No acute lung base findings. LIVER: Mild to moderate fatty replacement in the liver. No focal mass. GALLBLADDER AND BILE DUCTS: A few small stones in the proximal gallbladder but no wall thickening. No biliary ductal dilatation. SPLEEN: No acute abnormality. PANCREAS: No acute abnormality. ADRENAL GLANDS: No acute abnormality. KIDNEYS, URETERS AND BLADDER: Symmetric and nonobstructed renal excretion on delayed images. No renal mass. No urinary stone. No hydronephrosis. No perinephric or periureteral stranding. Mild thickening of the bladder, which could be due to nondistention or cystitis. GI AND BOWEL: The stomach and unopacified small bowel are unremarkable. The appendix is normal. There is moderate retained stool in the ascending colon. diverticulosis in the transverse, descending, and sigmoid colon, greatest in the sigmoid segment. Hazy inflammatory change is seen at the junction of the descending and sigmoid colon consistent with acute diverticulitis. There is no bowel obstruction. PERITONEUM AND RETROPERITONEUM: No ascites. No free air. No abscess. VASCULATURE: Aorta is normal in caliber. Mild aortic atherosclerosis. No aneurysm. LYMPH NODES: No lymphadenopathy. REPRODUCTIVE ORGANS: No acute abnormality. BONES AND SOFT TISSUES: Moderate L4-L5 facet hypertrophy with chronic grade 1  anterolisthesis. Mild lumbar dextroscoliosis. No acute or other significant osseous findings. Small umbilical hernia containing fat, a small low anterior pelvic wall midline fat hernia and small inguinal fat hernias. There is no incarcerated hernia. IMPRESSION: 1. Acute diverticulitis at the junction of the descending and sigmoid colon without free air or abscess. 2. Mild bladder wall thickening, possibly due to nondistention or cystitis. 3. Hepatic steatosis. 4. Cholelithiasis. 5. Aortic and coronary artery atherosclerosis. Electronically signed by: Francis Quam MD 07/01/2024 10:11 PM EDT RP Workstation: [id number]     Procedures   Medications Ordered in the ED  iohexol  (OMNIPAQUE ) 350  MG/ML injection 75 mL (75 mLs Intravenous Contrast Given 07/01/24 2127)                                    Medical Decision Making Amount and/or Complexity of Data Reviewed Labs: ordered.  Risk Prescription drug management.   Patient with diffuse abdominal tenderness.  Has had reported black stools.  Does have suprapubic tenderness dysuria.  Had nausea.  Differential diagnoses longed but does include cause such as colitis, infection.  Urine shows potential infection.  Will get CT scan to further evaluate.  Blood work otherwise reassuring.  CT scan shows likely uncomplicated diverticulitis.  With comorbidities will treat with antibiotics.  Appears reasonable for so low Augmentin  coverage.  Follow-up with PCP.  Did have dark stools, however guaiac negative.     Final diagnoses:  Diverticulitis    ED Discharge Orders          Ordered    amoxicillin -clavulanate (AUGMENTIN ) 875-125 MG tablet  Every 12 hours        07/01/24 2223               Patsey Lot, MD 07/01/24 2246

## 2024-07-01 NOTE — ED Notes (Addendum)
 Tiffany Berg

## 2024-07-02 ENCOUNTER — Other Ambulatory Visit: Payer: Self-pay | Admitting: Family Medicine

## 2024-07-02 NOTE — Telephone Encounter (Signed)
 Evaluated in the ED yesterday. Dx'ed with diverticulitis. Tiffany Berg

## 2024-07-02 NOTE — Progress Notes (Signed)
 [facility] - Clinic Note  07/03/2024   CHIEF COMPLAINT Patient presents for Retina Follow Up  HISTORY OF PRESENT ILLNESS: Tiffany Berg is a 68 y.o. female who presents to the clinic today for:  HPI     Retina Follow Up   Patient presents with  Wet AMD.  In right eye.  This started 12 weeks ago.  I, the attending physician,  performed the HPI with the patient and updated documentation appropriately.        Comments   Patient here for 12 weeks retina follow up for exu ARMD OD. Patient states vision is better than before. No eye pain. Sometimes on occasion OS has pain. Was in hospital day before yesterday. Has a stomach infection. On antibiotics. Uses drops AT [facility] OU.      Last edited by Valdemar Rogue, MD on 07/03/2024  1:11 PM.    Pt states her vision is ok thinks she needs new glasses. Its coming up on 1 year. Sees Dr. Fleeta, has appt scheduled  Referring physician: Fleeta Zerita DASEN, MD [address] [place],  [place] [address]  HISTORICAL INFORMATION:  Selected notes from the MEDICAL RECORD NUMBER Referred by Dr. Fleeta for ARMD eval LEE:  Ocular Hx- PMH-   CURRENT MEDICATIONS: No current outpatient medications on file. (Ophthalmic Drugs)   No current facility-administered medications for this visit. (Ophthalmic Drugs)   Current Outpatient Medications (Other)  Medication Sig   amoxicillin -clavulanate (AUGMENTIN ) 875-125 MG tablet Take 1 tablet by mouth every 12 (twelve) hours.   aspirin  EC 81 MG tablet Take 81 mg by mouth daily. Swallow whole.   B Complex Vitamins (VITAMIN B COMPLEX PO) Take 1 tablet by mouth daily.   Cyanocobalamin (VITAMIN B 12) 100 MCG LOZG Take by mouth.   diclofenac  Sodium (VOLTAREN ) 1 % GEL Apply 2 g topically 4 (four) times daily.   doxylamine, Sleep, (UNISOM) 25 MG tablet Take 25 mg by mouth at bedtime as needed. As need   fluticasone  (FLONASE ) 50 MCG/ACT nasal spray Place 1 spray into both nostrils 2 (two) times daily.    levothyroxine  (SYNTHROID ) 75 MCG tablet Take 1 tablet (75 mcg total) by mouth daily before breakfast.   losartan  (COZAAR ) 50 MG tablet Take 1 tablet (50 mg total) by mouth daily.   Magnesium  200 MG TABS Take by mouth daily at 6 (six) AM.   meloxicam  (MOBIC ) 15 MG tablet Take 1 tablet (15 mg total) by mouth daily.   Omega 3 1000 MG CAPS Take 1,000 mg by mouth daily.   omeprazole  (PRILOSEC) 40 MG capsule Take 1 capsule (40 mg total) by mouth daily.   OVER THE COUNTER MEDICATION Vitamin D 3 1000 mg   rosuvastatin  (CRESTOR ) 10 MG tablet Take 1 tablet by mouth once daily   tiZANidine  (ZANAFLEX ) 4 MG tablet Take 1 tablet (4 mg total) by mouth at bedtime as needed for muscle spasms.   traZODone  (DESYREL ) 50 MG tablet Take 1 tablet (50 mg total) by mouth at bedtime.   No current facility-administered medications for this visit. (Other)   REVIEW OF SYSTEMS: ROS   Positive for: Gastrointestinal, Endocrine, Cardiovascular, Eyes Negative for: Constitutional, Neurological, Skin, Genitourinary, Musculoskeletal, HENT, Respiratory, Psychiatric, Allergic/Imm, Heme/Lymph Last edited by Orval Asberry RAMAN, COA on 07/03/2024  8:56 AM.     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   w/ symptomatic bradycardia, rate  30s;  s/p  PPM 03-14-2020   Myalgia, multiple sites    Pre-diabetes    Symptomatic bradycardia 03/12/2020   s/p PPM   Wears glasses    Wears partial dentures    upper and lower   Past Surgical History:  Procedure Laterality Date   KATRINA OSTEOTOMY Right 10/15/2020   Procedure: KATRINA OSTEOTOMY;  Surgeon: Silva Juliene SAUNDERS, DPM;  Location: [facility] [place];  Service: Podiatry;  Laterality: Right;  REGIONAL BLOCK   AIKEN OSTEOTOMY Left 02/24/2022   Procedure: KATRINA OSTEOTOMY;  Surgeon: Silva Juliene SAUNDERS, DPM;  [facility];  Service: Podiatry;  Laterality: Left;   ANTERIOR AND POSTERIOR REPAIR N/A 02/18/2015   Procedure: ANTERIOR (CYSTOCELE) AND POSTERIOR REPAIR (RECTOCELE);  Surgeon: Jon Rummer, MD;  Location: [facility];  Service: Gynecology;  Laterality: N/A;   BLADDER SUSPENSION N/A 02/18/2015   Procedure: TRANSVAGINAL TAPE (TVT) PROCEDURE;  Surgeon: Jon Rummer, MD;  Location: [facility];  Service: Gynecology;  Laterality: N/A;   COLONOSCOPY     pt states she might have had one many years ago   CYSTOSCOPY Bilateral 02/18/2015   Procedure: CYSTOSCOPY;  Surgeon: Jon Rummer, MD;  Location: [facility];  Service: Gynecology;  Laterality: Bilateral;   GRAFT APPLICATION Left 02/24/2022   Procedure: BONE GRAFT;  Surgeon: Silva Juliene SAUNDERS, DPM;  Location: [facility] [place];  Service: Podiatry;  Laterality: Left;   HALLUX VALGUS LAPIDUS Right 10/15/2020   Procedure: HALLUX VALGUS LAPIDUS AND BONE GRAFT;  Surgeon: Silva Juliene SAUNDERS, DPM;  Location: [facility];  Service: Podiatry;  Laterality: Right;  WITH IV SEDATION; REGIONAL BLOCK   HALLUX VALGUS LAPIDUS Left 02/24/2022   Procedure: HALLUX VALGUS LAPIDUS;  Surgeon: Silva Juliene SAUNDERS, DPM;  Location: [facility] [place];  Service: Podiatry;  Laterality: Left;   HAMMER TOE SURGERY Right 10/15/2020   Procedure: HAMMER TOE CORRECTION;  Surgeon: Silva Juliene SAUNDERS, DPM;  Location: [facility]  [place];  Service: Podiatry;  Laterality: Right;  REGIONAL BLOCK   HAMMER TOE SURGERY Left 02/24/2022   Procedure: HAMMER TOE CORRECTION SECOND TOE LEFT FOOT;  Surgeon: Silva Juliene SAUNDERS, DPM;  Location: [facility] [place];  Service: Podiatry;  Laterality: Left;   LAPAROSCOPIC ASSISTED VAGINAL HYSTERECTOMY N/A 02/18/2015   Procedure: LAPAROSCOPIC ASSISTED VAGINAL HYSTERECTOMY;  Surgeon: Jon Rummer, MD;  Location: [facility];  Service: Gynecology;  Laterality: N/A;   MASTECTOMY, PARTIAL Left 08-12-2013  @[facility]    excision another site   PACEMAKER IMPLANT N/A 03/14/2020   Procedure: PACEMAKER IMPLANT;  Surgeon: Inocencio Soyla Lunger, MD;  Location: [facility];  Service: Cardiovascular;  Laterality: N/A;   RADIOACTIVE SEED GUIDED PARTIAL MASTECTOMY/AXILLARY SENTINEL NODE BIOPSY/AXILLARY NODE DISSECTION Left 07-22-2013  @[facility]    SALPINGOOPHORECTOMY Bilateral 02/18/2015   Procedure: BILATERAL SALPINGO OOPHORECTOMY;  Surgeon: Jon Rummer, MD;  Location:  [facility];  Service: Gynecology;  Laterality: Bilateral;   FAMILY HISTORY Family History  Problem Relation Age of Onset   Heart disease Mother    Colon cancer Father    Cancer Father        prostata   Stomach cancer Sister    Cancer Sister 69       Breast   Esophageal cancer Neg Hx    Rectal cancer Neg Hx    SOCIAL HISTORY Social History   Tobacco Use   Smoking status: Never   Smokeless tobacco: Never  Vaping Use   Vaping status: Never Used  Substance Use Topics   Alcohol use: Yes    Comment: socially   Drug use: Never       OPHTHALMIC EXAM:  Base Eye Exam     Visual Acuity (Snellen - Linear)       Right Left   Dist cc 20/25 -2 20/25 -1   Dist ph cc 20/20 -2 20/25 +1    Correction: Glasses         Tonometry (Tonopen, 8:52 AM)       Right Left   Pressure 18 15         Pupils       Dark Light Shape React APD   Right 3 2 Round Brisk None   Left 3 2 Round Brisk None         Visual  Fields (Counting fingers)       Left Right    Full Full         Extraocular Movement       Right Left    Full, Ortho Full, Ortho         Neuro/Psych     Oriented x3: Yes   Mood/Affect: Normal         Dilation     Both eyes: 1.0% Mydriacyl, 2.5% Phenylephrine  @ 8:52 AM           Slit Lamp and Fundus Exam     Slit Lamp Exam       Right Left   Lids/Lashes Dermatochalasis - upper lid Dermatochalasis - upper lid   Conjunctiva/Sclera mild nasal pterygium mild nasal pterygium   Cornea nasal pterygium, trace tear film debris nasal pterygium, trace tear film debris, 1+ PEE   Anterior Chamber deep and clear deep and clear   Iris Round and dilated, No NVI Round and dilated, No NVI   Lens 2+ Nuclear sclerosis, 2-3+ Cortical cataract 2+ Nuclear sclerosis, 2+ Cortical cataract   Anterior Vitreous mild syneresis mild syneresis         Fundus Exam       Right Left   Disc Pink and Sharp, Compact Pink and Sharp, Compact   C/D Ratio 0.4 0.4   Macula Flat, good foveal reflex, focal CNV with shallow SRF and edema ST macula -- stably improved, mild drusen, no frank heme Flat, Good foveal reflex, fine drusen, No heme or edema   Vessels attenuated, mild tortuosity attenuated, mild tortuosity   Periphery Attached, No heme Attached, No heme           Refraction     Wearing Rx       Sphere Cylinder Axis Add   Right -0.50 +0.75 114 +2.25   Left -0.50 +2.50 066 +2.25           IMAGING AND PROCEDURES  Imaging and Procedures for 07/03/2024  OCT, Retina - OU - Both Eyes  Right Eye Quality was good. Central Foveal Thickness: 247. Progression has been stable. Findings include normal foveal contour, no IRF, no SRF, retinal drusen , subretinal hyper-reflective material, pigment epithelial detachment (Focal PED with stable improvement in shallow SRF ST to fovea, partial PVD ).   Left Eye Quality was good. Central Foveal Thickness: 245. Progression has been  stable. Findings include normal foveal contour, no IRF, no SRF, retinal drusen .   Notes *Images captured and stored on drive  Diagnosis / Impression:  OD: Focal PED/CNV with stable improvement in shallow SRF ST to fovea, partial PVD OS: NFP, no IRF/SRF  Clinical management:  See below  Abbreviations: NFP - Normal foveal profile. CME - cystoid macular edema. PED - pigment epithelial detachment. IRF - intraretinal fluid. SRF - subretinal fluid. EZ - ellipsoid zone. ERM - epiretinal membrane. ORA - outer retinal atrophy. ORT - outer retinal tubulation. SRHM - subretinal hyper-reflective material. IRHM - intraretinal hyper-reflective material      Intravitreal Injection, Pharmacologic Agent - OD - Right Eye       Time Out 07/03/2024. 9:40 AM. Confirmed correct patient, procedure, site, and patient consented.   Anesthesia Topical anesthesia was used. Anesthetic medications included Lidocaine  2%, Proparacaine 0.5%.   Procedure Preparation included 5% betadine to ocular surface, eyelid speculum. A supplied needle was used.   Injection: 1.25 mg Bevacizumab  1.25mg /0.27ml   Route: Intravitreal, Site: Right Eye   NDC: [id number], Lot: [id number], Expiration date: 07/27/2024   Post-op Post injection exam found visual acuity of at least counting fingers. The patient tolerated the procedure well. There were no complications. The patient received written and verbal post procedure care education.           ASSESSMENT/PLAN:   ICD-10-CM   1. Exudative age-related macular degeneration of right eye with active choroidal neovascularization (HCC)  H35.3211 OCT, Retina - OU - Both Eyes    Intravitreal Injection, Pharmacologic Agent - OD - Right Eye    Bevacizumab  (AVASTIN ) SOLN 1.25 mg    2. Intermediate stage nonexudative age-related macular degeneration of left eye  H35.3122     3. Diabetes mellitus type 2 without retinopathy (HCC)  E11.9     4. Essential hypertension  I10     5.  Hypertensive retinopathy of both eyes  H35.033     6. Combined forms of age-related cataract of both eyes  H25.813      Exudative age related macular degeneration OD  - s/p IVA OD #1 (01.14.25), #2 (02.11.25), #3 (03.11.25), #4 (04.15.25),#5 (06.03.25), #6 (08.05.25)  - FA (01.14.24) shows foval CNV with leakage superior fovea  - BCVA OD improved to 20/20  - OCT shows focal PED/CNV with stable improvement in shallow SRF ST to fovea at 12 wks  - recommend IVA OD #7 today, 10.30.25 w/ f/u ext to 16 wks  - pt wishes to be treated with IVA  - RBA of procedure discussed, questions answered - IVA informed consent obtained and signed, 01.14.25 (OD) - see procedure note - f/u in 16 wks, DFE, OCT, possible injection  2. Age related macular degeneration, non-exudative, OS  - The incidence, anatomy, and pathology of dry AMD, risk of progression, and the AREDS and AREDS 2 study including smoking risks discussed with patient.  - Recommend amsler grid monitoring  - monitor  3. Diabetes mellitus, type 2 without retinopathy  - A1c: 6.1 on 09.22.25,  6.4 on 09.27.24 - The incidence, risk factors for progression, natural history and treatment options for diabetic  retinopathy  were discussed with patient.   - The need for close monitoring of blood glucose, blood pressure, and serum lipids, avoiding cigarette or any type of tobacco, and the need for long term follow up was also discussed with patient. - f/u in 1 year, sooner prn  4,5. Hypertensive retinopathy OU - discussed importance of tight BP control - monitor  6. Mixed Cataract OU - The symptoms of cataract, surgical options, and treatments and risks were discussed with patient. - discussed diagnosis and progression - monitor   Ophthalmic Meds Ordered this visit:  Meds ordered this encounter  Medications   Bevacizumab  (AVASTIN ) SOLN 1.25 mg     Return in about 16 weeks (around 10/23/2024) for exu ARMD OD, DFE, OCT, Possible  Injxn.  There are no Patient Instructions on file for this visit.  Explained the diagnoses, plan, and follow up with the patient and they expressed understanding.  Patient expressed understanding of the importance of proper follow up care.   This document serves as a record of services personally performed by Redell JUDITHANN Hans, MD, PhD. It was created on their behalf by Auston Muzzy, COMT. The creation of this record is the provider's dictation and/or activities during the visit.  Electronically signed by: Auston Muzzy, COMT 07/03/24 1:21 PM  This document serves as a record of services personally performed by Redell JUDITHANN Hans, MD, PhD. It was created on their behalf by Almetta Pesa, an ophthalmic technician. The creation of this record is the provider's dictation and/or activities during the visit.    Electronically signed by: Almetta Pesa, OA, 07/03/24  1:21 PM  Redell JUDITHANN Hans, M.D., Ph.D. Diseases & Surgery of the Retina and Vitreous [facility]  I have reviewed the above documentation for accuracy and completeness, and I agree with the above. Redell JUDITHANN Hans, M.D., Ph.D. 07/03/24 1:25 PM   Abbreviations: M myopia (nearsighted); A astigmatism; H hyperopia (farsighted); P presbyopia; Mrx spectacle prescription;  CTL contact lenses; OD right eye; OS left eye; OU both eyes  XT exotropia; ET esotropia; PEK punctate epithelial keratitis; PEE punctate epithelial erosions; DES dry eye syndrome; MGD meibomian gland dysfunction; ATs artificial tears; PFAT's preservative free artificial tears; NSC nuclear sclerotic cataract; PSC posterior subcapsular cataract; ERM epi-retinal membrane; PVD posterior vitreous detachment; RD retinal detachment; DM diabetes mellitus; DR diabetic retinopathy; NPDR non-proliferative diabetic retinopathy; PDR proliferative diabetic retinopathy; CSME clinically significant macular edema; DME diabetic macular edema; dbh dot blot hemorrhages; CWS  cotton wool spot; POAG primary open angle glaucoma; C/D cup-to-disc ratio; HVF humphrey visual field; GVF goldmann visual field; OCT optical coherence tomography; IOP intraocular pressure; BRVO Branch retinal vein occlusion; CRVO central retinal vein occlusion; CRAO central retinal artery occlusion; BRAO branch retinal artery occlusion; RT retinal tear; SB scleral buckle; PPV pars plana vitrectomy; VH Vitreous hemorrhage; PRP panretinal laser photocoagulation; IVK intravitreal kenalog; VMT vitreomacular traction; MH Macular hole;  NVD neovascularization of the disc; NVE neovascularization elsewhere; AREDS age related eye disease study; ARMD age related macular degeneration; POAG primary open angle glaucoma; EBMD epithelial/anterior basement membrane dystrophy; ACIOL anterior chamber intraocular lens; IOL intraocular lens; PCIOL posterior chamber intraocular lens; Phaco/IOL phacoemulsification with intraocular lens placement; PRK photorefractive keratectomy; LASIK laser assisted in situ keratomileusis; HTN hypertension; DM diabetes mellitus; COPD chronic obstructive pulmonary disease

## 2024-07-03 ENCOUNTER — Encounter (INDEPENDENT_AMBULATORY_CARE_PROVIDER_SITE_OTHER): Payer: Self-pay | Admitting: Ophthalmology

## 2024-07-03 ENCOUNTER — Encounter (INDEPENDENT_AMBULATORY_CARE_PROVIDER_SITE_OTHER): Admitting: Ophthalmology

## 2024-07-03 ENCOUNTER — Ambulatory Visit (INDEPENDENT_AMBULATORY_CARE_PROVIDER_SITE_OTHER): Admitting: Ophthalmology

## 2024-07-03 DIAGNOSIS — H353211 Exudative age-related macular degeneration, right eye, with active choroidal neovascularization: Secondary | ICD-10-CM | POA: Diagnosis not present

## 2024-07-03 DIAGNOSIS — E119 Type 2 diabetes mellitus without complications: Secondary | ICD-10-CM

## 2024-07-03 DIAGNOSIS — I10 Essential (primary) hypertension: Secondary | ICD-10-CM | POA: Diagnosis not present

## 2024-07-03 DIAGNOSIS — H353122 Nonexudative age-related macular degeneration, left eye, intermediate dry stage: Secondary | ICD-10-CM

## 2024-07-03 DIAGNOSIS — H35033 Hypertensive retinopathy, bilateral: Secondary | ICD-10-CM

## 2024-07-03 DIAGNOSIS — H25813 Combined forms of age-related cataract, bilateral: Secondary | ICD-10-CM

## 2024-07-03 MED ORDER — BEVACIZUMAB CHEMO INJECTION 1.25MG/0.05ML SYRINGE FOR KALEIDOSCOPE
1.2500 mg | INTRAVITREAL | Status: AC | PRN
  Administered 2024-07-03: 1.25 mg via INTRAVITREAL

## 2024-07-07 ENCOUNTER — Other Ambulatory Visit: Payer: Self-pay | Admitting: Family Medicine

## 2024-07-07 DIAGNOSIS — E039 Hypothyroidism, unspecified: Secondary | ICD-10-CM

## 2024-07-09 NOTE — Progress Notes (Deleted)
    [facility] [address] [place] [address] Phone: [phone]   Assessment and Plan:     ***    Pertinent previous records reviewed include ***   Follow Up: ***     Subjective:   I, Tiffany Berg, am serving as a neurosurgeon for Doctor Tiffany Berg   Chief Complaint: right shoulder pain   HPI:    06/10/2024 Patient is a 68 year old female with right shoulder pain. Patient states pain started 2 months ago. Decreased ROM. She was dx with arthritis. Tizanidine  for the pain and that helps a little. Pain radiates to the upper trap.    07/15/2024 Patient states   Relevant Historical Information: Hypertension, history of AV block, prediabetes  Additional pertinent review of systems negative.   Current Outpatient Medications:    amoxicillin -clavulanate (AUGMENTIN ) 875-125 MG tablet, Take 1 tablet by mouth every 12 (twelve) hours., Disp: 14 tablet, Rfl: 0   aspirin  EC 81 MG tablet, Take 81 mg by mouth daily. Swallow whole., Disp: , Rfl:    B Complex Vitamins (VITAMIN B COMPLEX PO), Take 1 tablet by mouth daily., Disp: , Rfl:    Cyanocobalamin (VITAMIN B 12) 100 MCG LOZG, Take by mouth., Disp: , Rfl:    diclofenac  Sodium (VOLTAREN ) 1 % GEL, Apply 2 g topically 4 (four) times daily., Disp: 150 g, Rfl: 2   doxylamine, Sleep, (UNISOM) 25 MG tablet, Take 25 mg by mouth at bedtime as needed. As need, Disp: , Rfl:    fluticasone  (FLONASE ) 50 MCG/ACT nasal spray, Place 1 spray into both nostrils 2 (two) times daily., Disp: 16 g, Rfl: 0   levothyroxine  (SYNTHROID ) 75 MCG tablet, TAKE 1 TABLET BY MOUTH DAILY BEFORE BREAKFAST, Disp: 90 tablet, Rfl: 0   losartan  (COZAAR ) 50 MG tablet, Take 1 tablet (50 mg total) by mouth daily., Disp: 30 tablet, Rfl: 11   Magnesium  200 MG TABS, Take by mouth daily at 6 (six) AM., Disp: , Rfl:    meloxicam  (MOBIC ) 15 MG tablet, Take 1 tablet (15 mg total) by mouth daily., Disp: 21 tablet, Rfl: 0    Omega 3 1000 MG CAPS, Take 1,000 mg by mouth daily., Disp: , Rfl:    omeprazole  (PRILOSEC) 40 MG capsule, Take 1 capsule (40 mg total) by mouth daily., Disp: 60 capsule, Rfl: 0   OVER THE COUNTER MEDICATION, Vitamin D 3 1000 mg, Disp: , Rfl:    rosuvastatin  (CRESTOR ) 10 MG tablet, Take 1 tablet by mouth once daily, Disp: 90 tablet, Rfl: 0   tiZANidine  (ZANAFLEX ) 4 MG tablet, Take 1 tablet (4 mg total) by mouth at bedtime as needed for muscle spasms., Disp: 30 tablet, Rfl: 0   traZODone  (DESYREL ) 50 MG tablet, Take 1 tablet (50 mg total) by mouth at bedtime., Disp: 90 tablet, Rfl: 1   Objective:     There were no vitals filed for this visit.    There is no height or weight on file to calculate BMI.    Physical Exam:    ***   Electronically signed by:  Odis Berg [facility] 7:44 AM 07/09/24

## 2024-07-11 ENCOUNTER — Encounter: Payer: Self-pay | Admitting: Family Medicine

## 2024-07-11 ENCOUNTER — Ambulatory Visit: Admitting: Family Medicine

## 2024-07-11 VITALS — BP 124/76 | HR 64 | Temp 97.9°F | Resp 16 | Ht 63.0 in | Wt 150.8 lb

## 2024-07-11 DIAGNOSIS — R140 Abdominal distension (gaseous): Secondary | ICD-10-CM

## 2024-07-11 DIAGNOSIS — R1013 Epigastric pain: Secondary | ICD-10-CM | POA: Diagnosis not present

## 2024-07-11 DIAGNOSIS — K5732 Diverticulitis of large intestine without perforation or abscess without bleeding: Secondary | ICD-10-CM | POA: Diagnosis not present

## 2024-07-11 MED ORDER — OMEPRAZOLE 40 MG PO CPDR: 40.0000 mg | DELAYED_RELEASE_CAPSULE | Freq: Every day | ORAL | 0 refills | Status: AC

## 2024-07-11 NOTE — Progress Notes (Signed)
 Chief Complaint  Patient presents with   Hospitalization Follow-up    Discussed the use of AI scribe software for clinical note transcription with the patient, who gave verbal consent to proceed.  History of Present Illness Geraldine Sandberg is a 68 year old female with a PMHx significant for Mobitz II s/p pacemaker placement, HTN, atherosclerosis of aorta, hypothyroidism, pelvic prolapse, HLD, and prediabetes here today for ED follow up.  She was seen in the ED for abdominal pain and diagnosed with diverticulitis on July 01, 2024, due to severe abdominal pain.  She was prescribed Augmentin  875-125 mg bid, which she completed two days ago. The abdominal pain has improved but persists at a level of 6 out of 10, compared to more severe pain at the time of her hospital visit. The pain is constant and she experiences nausea but no vomiting. She has not identified exacerbating or alleviating factors.  Abdominal/pelvic CT 07/01/24: 1. Acute diverticulitis at the junction of the descending and sigmoid colon without free air or abscess. 2. Mild bladder wall thickening, possibly due to nondistention or cystitis. 3. Hepatic steatosis. 4. Cholelithiasis. 5. Aortic and coronary artery atherosclerosis.  She denies any fever, chills, blood in her stool, or melena; although she noted that her stool was very dark when she went to the hospital. A fecal occult blood test in the ED was negative.  She has bowel movements once or twice daily, which are normal in consistency.  She experiences significant bloating and describes her abdomen as 'very, very bloated'.   Lab Results  Component Value Date   WBC 7.3 07/01/2024   HGB 14.4 07/01/2024   HCT 42.7 07/01/2024   MCV 89.5 07/01/2024   PLT 321 07/01/2024   Lab Results  Component Value Date   NA 139 07/01/2024   CL 101 07/01/2024   K 4.2 07/01/2024   CO2 27 07/01/2024   BUN 15 07/01/2024   CREATININE 0.60 07/01/2024   GFRNONAA >60  07/01/2024   CALCIUM  9.4 07/01/2024   ALBUMIN 3.9 07/01/2024   GLUCOSE 108 (H) 07/01/2024   Lab Results  Component Value Date   ALT 22 07/01/2024   AST 26 07/01/2024   ALKPHOS 87 07/01/2024   BILITOT 0.6 07/01/2024   She notes that drinking carbonated water  causes her to burp, which provides temporary relief from the bloating.  Her last colonoscopy was in 2024. GERD:  She is not currently taking any medication for  this problem. Epigastric abdominal pain, not radiated.  Lab Results  Component Value Date   LIPASE 32 07/01/2024   Review of Systems  Constitutional:  Negative for activity change and unexpected weight change.  HENT:  Negative for mouth sores, sore throat and trouble swallowing.   Respiratory:  Negative for cough and wheezing.   Endocrine: Negative for cold intolerance and heat intolerance.  Genitourinary:  Negative for decreased urine volume, dysuria and hematuria.  Skin:  Negative for rash.  Neurological:  Negative for syncope and weakness.  Psychiatric/Behavioral:  Negative for confusion and hallucinations.   See other pertinent positives and negatives in HPI.  Current Outpatient Medications on File Prior to Visit  Medication Sig Dispense Refill   amoxicillin -clavulanate (AUGMENTIN ) 875-125 MG tablet Take 1 tablet by mouth every 12 (twelve) hours. 14 tablet 0   aspirin  EC 81 MG tablet Take 81 mg by mouth daily. Swallow whole.     B Complex Vitamins (VITAMIN B COMPLEX PO) Take 1 tablet by mouth daily.     Cyanocobalamin (  VITAMIN B 12) 100 MCG LOZG Take by mouth.     diclofenac  Sodium (VOLTAREN ) 1 % GEL Apply 2 g topically 4 (four) times daily. 150 g 2   doxylamine, Sleep, (UNISOM) 25 MG tablet Take 25 mg by mouth at bedtime as needed. As need     fluticasone  (FLONASE ) 50 MCG/ACT nasal spray Place 1 spray into both nostrils 2 (two) times daily. 16 g 0   levothyroxine  (SYNTHROID ) 75 MCG tablet TAKE 1 TABLET BY MOUTH DAILY BEFORE BREAKFAST 90 tablet 0   losartan   (COZAAR ) 50 MG tablet Take 1 tablet (50 mg total) by mouth daily. 30 tablet 11   Magnesium  200 MG TABS Take by mouth daily at 6 (six) AM.     meloxicam  (MOBIC ) 15 MG tablet Take 1 tablet (15 mg total) by mouth daily. 21 tablet 0   Omega 3 1000 MG CAPS Take 1,000 mg by mouth daily.     OVER THE COUNTER MEDICATION Vitamin D 3 1000 mg     rosuvastatin  (CRESTOR ) 10 MG tablet Take 1 tablet by mouth once daily 90 tablet 0   tiZANidine  (ZANAFLEX ) 4 MG tablet Take 1 tablet (4 mg total) by mouth at bedtime as needed for muscle spasms. 30 tablet 0   traZODone  (DESYREL ) 50 MG tablet Take 1 tablet (50 mg total) by mouth at bedtime. 90 tablet 1   No current facility-administered medications on file prior to visit.    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   w/ symptomatic bradycardia, rate 30s;  s/p  PPM 03-14-2020   Myalgia, multiple sites    Pre-diabetes    Symptomatic bradycardia 03/12/2020   s/p PPM   Wears glasses    Wears partial dentures    upper and lower   No Known Allergies  Social History   Socioeconomic History   Marital status: Legally Separated    Spouse name: Not on file   Number of children: Not on file   Years of education: Not on file   Highest education level: Not on file  Occupational History   Not on file  Tobacco Use    Smoking status: Never   Smokeless tobacco: Never  Vaping Use   Vaping status: Never Used  Substance and Sexual Activity   Alcohol use: Yes    Comment: socially   Drug use: Never   Sexual activity: Yes    Birth control/protection: Post-menopausal, Surgical  Other Topics Concern   Not on file  Social History Narrative   Not on file   Social Drivers of Health   Financial Resource Strain: Low Risk  (02/22/2022)   Overall Financial Resource Strain (CARDIA)    Difficulty of Paying Living Expenses: Not hard at all  Food Insecurity: No Food Insecurity (02/22/2022)   Hunger  Vital Sign    Worried About Programme Researcher, Broadcasting/film/video in the Last Year: Never true    Ran Out of Food in the Last Year: Never true  Transportation Needs: No Transportation Needs (02/22/2022)   PRAPARE - Administrator, Civil Service (Medical): No    Lack of Transportation (Non-Medical): No  Physical Activity: Insufficiently Active (02/22/2022)   Exercise Vital Sign    Days of Exercise per Week: 2 days    Minutes of Exercise per Session: 60 min  Stress: No Stress Concern Present (02/22/2022)   [employer] of Occupational Health - Occupational Stress Questionnaire    Feeling of Stress : Not at all  Social Connections: Unknown (02/22/2022)   Social Connection and Isolation Panel    Frequency of Communication with Friends and Family: More than three times a week    Frequency of Social Gatherings with Friends and Family: More than three times a week    Attends Religious Services: Not on file    Active Member of Clubs or Organizations: Yes    Attends Banker Meetings: More than 4 times per year    Marital Status: Separated    Vitals:   07/11/24 1525  BP: 124/76  Pulse: 64  Resp: 16  Temp: 97.9 F (36.6 C)  SpO2: 97%   Body mass index is 26.71 kg/m.  Physical Exam Vitals and nursing note reviewed.  Constitutional:      General: She is not in acute distress.    Appearance: She is  well-developed.  HENT:     Head: Normocephalic and atraumatic.     Mouth/Throat:     Mouth: Mucous membranes are moist.     Pharynx: Oropharynx is clear. Uvula midline.  Eyes:     Conjunctiva/sclera: Conjunctivae normal.  Cardiovascular:     Rate and Rhythm: Normal rate and regular rhythm.     Heart sounds: No murmur heard. Pulmonary:     Effort: Pulmonary effort is normal. No respiratory distress.     Breath sounds: Normal breath sounds.  Abdominal:     Palpations: Abdomen is soft. There is no mass.     Tenderness: There is abdominal tenderness (Diffuse abdominal discomfort, mainly epigastric) in the epigastric area. There is no guarding or rebound.  Musculoskeletal:     Right lower leg: No edema.     Left lower leg: No edema.  Skin:    General: Skin is warm.     Findings: No erythema or rash.  Neurological:     General: No focal deficit present.     Mental Status: She is alert and oriented to person, place, and time.     Cranial Nerves: No cranial nerve deficit.     Gait: Gait normal.  Psychiatric:        Mood and Affect: Affect normal. Mood is anxious.    ASSESSMENT AND PLAN:  Ms. Leland Staszewski was seen today for hospitalization follow-up.  Diagnoses and all orders for this visit: Orders Placed This Encounter  Procedures   Ambulatory referral to Gastroenterology   Diverticulitis of ascending colon Completed treatment with Augmentin . Pain has improved but still having generalized abdominal pain. On examination, no LLQ abdominal pain. I do not think imaging needs to be repeated. Monitor for fever. Instructed about warning signs. Colonoscopy in 08/2023, 3 years follow up was recommended.  -     Ambulatory referral to Gastroenterology  Dyspepsia Epigastric abdominal pain on examination, no heartburn. In the past she took  Omeprazole  and it did help. Omeprazole  40 mg daily before lunch to be resumed. GERD precautions.  -     omeprazole  (PRILOSEC) 40 MG capsule;  Take 1 capsule (40 mg total) by mouth daily before lunch.  Abdominal bloating We discussed possible causes, recent episode of diverticulitis and abx treatment can be contributing factors. She would like a referral to GI. Instructed about warning signs.  Return if symptoms worsen or fail to improve, for keep next appointment.  Katalea Ucci G. Tacie Mccuistion, MD  [facility]. [place] office.

## 2024-07-11 NOTE — Patient Instructions (Addendum)
 A few things to remember from today's visit:  Diverticulitis of ascending colon - Plan: Ambulatory referral to Gastroenterology  Gastroesophageal reflux disease, unspecified whether esophagitis present - Plan: omeprazole  (PRILOSEC) 40 MG capsule  Continue advancing diet as tolerated.  Omeprazole  before lunch. Appt with gastro will be arranged as requested.  If you need refills for medications you take chronically, please call your pharmacy. Do not use My Chart to request refills or for acute issues that need immediate attention. If you send a my chart message, it may take a few days to be addressed, specially if I am not in the office.  Please be sure medication list is accurate. If a new problem present, please set up appointment sooner than planned today.

## 2024-07-15 ENCOUNTER — Ambulatory Visit: Admitting: Sports Medicine

## 2024-07-18 ENCOUNTER — Ambulatory Visit (INDEPENDENT_AMBULATORY_CARE_PROVIDER_SITE_OTHER): Payer: Medicare Other

## 2024-07-18 DIAGNOSIS — I441 Atrioventricular block, second degree: Secondary | ICD-10-CM | POA: Diagnosis not present

## 2024-07-20 LAB — CUP PACEART REMOTE DEVICE CHECK
Battery Remaining Longevity: 77 mo
Battery Voltage: 2.97 V
Brady Statistic AP VP Percent: 39.36 %
Brady Statistic AP VS Percent: 1.28 %
Brady Statistic AS VP Percent: 54.68 %
Brady Statistic AS VS Percent: 4.68 %
Brady Statistic RA Percent Paced: 40.63 %
Brady Statistic RV Percent Paced: 94.04 %
Date Time Interrogation Session: 20251114092921
Implantable Lead Connection Status: 753985
Implantable Lead Connection Status: 753985
Implantable Lead Implant Date: 20210711
Implantable Lead Implant Date: 20210711
Implantable Lead Location: 753859
Implantable Lead Location: 753860
Implantable Lead Model: 5076
Implantable Lead Model: 5076
Implantable Pulse Generator Implant Date: 20210711
Lead Channel Impedance Value: 323 Ohm
Lead Channel Impedance Value: 361 Ohm
Lead Channel Impedance Value: 380 Ohm
Lead Channel Impedance Value: 513 Ohm
Lead Channel Pacing Threshold Amplitude: 0.625 V
Lead Channel Pacing Threshold Amplitude: 0.75 V
Lead Channel Pacing Threshold Pulse Width: 0.4 ms
Lead Channel Pacing Threshold Pulse Width: 0.4 ms
Lead Channel Sensing Intrinsic Amplitude: 4.125 mV
Lead Channel Sensing Intrinsic Amplitude: 4.125 mV
Lead Channel Sensing Intrinsic Amplitude: 4.125 mV
Lead Channel Sensing Intrinsic Amplitude: 4.125 mV
Lead Channel Setting Pacing Amplitude: 1.5 V
Lead Channel Setting Pacing Amplitude: 2.5 V
Lead Channel Setting Pacing Pulse Width: 0.4 ms
Lead Channel Setting Sensing Sensitivity: 1.2 mV
Zone Setting Status: 755011
Zone Setting Status: 755011

## 2024-07-22 ENCOUNTER — Ambulatory Visit: Payer: Self-pay | Admitting: Cardiology

## 2024-07-22 NOTE — Progress Notes (Unsigned)
 [facility] [address] [place] [address] Phone: [phone]   Assessment and Plan:     1. Chronic right shoulder pain (Primary) 2. 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  Most consistent with improving subacromial bursitis likely flared by physical activity and side sleeping - Use meloxicam  15 mg daily as needed for breakthrough pain.  Recommend limiting chronic NSAIDs to 1-2 doses per week to prevent long-term side effects. Use Tylenol  500 to 1000 mg tablets 2-3 times a day as needed for day-to-day pain relief.    - Continue HEP for rotator cuff    Pertinent previous records reviewed include none  Interpreter used throughout entirety of office visit.  Follow Up: As needed if no improvement or worsening of symptoms.  Could consider subacromial CSI versus physical therapy versus repeat NSAID course   Subjective:   I, [facility], am serving as a neurosurgeon for Doctor Morene Mace   Chief Complaint: right shoulder pain   HPI:    06/10/2024 Patient is a 68 year old female with right shoulder pain. Patient states pain started 2 months ago. Decreased ROM. She was dx with arthritis. Tizanidine  for the pain and that helps a little. Pain radiates to the upper trap.    07/23/2024 Patient states she is feeling good. Only a little pain    Relevant Historical Information: Hypertension, history of AV block, prediabetes  Additional pertinent review of systems negative.   Current Outpatient Medications:    meloxicam  (MOBIC ) 15 MG tablet, Take 1 tablet (15 mg total) by mouth daily as needed for pain., Disp: 30 tablet, Rfl: 0   amoxicillin -clavulanate (AUGMENTIN ) 875-125 MG tablet, Take 1 tablet by mouth every 12 (twelve) hours., Disp: 14 tablet, Rfl: 0   aspirin  EC 81 MG  tablet, Take 81 mg by mouth daily. Swallow whole., Disp: , Rfl:    B Complex Vitamins (VITAMIN B COMPLEX PO), Take 1 tablet by mouth daily., Disp: , Rfl:    Cyanocobalamin (VITAMIN B 12) 100 MCG LOZG, Take by mouth., Disp: , Rfl:    diclofenac  Sodium (VOLTAREN ) 1 % GEL, Apply 2 g topically 4 (four) times daily., Disp: 150 g, Rfl: 2   doxylamine, Sleep, (UNISOM) 25 MG tablet, Take 25 mg by mouth at bedtime as needed. As need, Disp: , Rfl:    fluticasone  (FLONASE ) 50 MCG/ACT nasal spray, Place 1 spray into both nostrils 2 (two) times daily., Disp: 16 g, Rfl: 0   levothyroxine  (SYNTHROID ) 75 MCG tablet, TAKE 1 TABLET BY MOUTH DAILY BEFORE BREAKFAST, Disp: 90 tablet, Rfl: 0   losartan  (COZAAR ) 50 MG tablet, Take 1 tablet (50 mg total) by mouth daily., Disp: 30 tablet, Rfl: 11   Magnesium  200 MG TABS, Take by mouth daily at 6 (six) AM., Disp: , Rfl:    meloxicam  (MOBIC ) 15 MG tablet, Take 1 tablet (15 mg total) by mouth daily., Disp: 21 tablet, Rfl: 0   Omega 3 1000 MG CAPS, Take 1,000 mg by mouth daily., Disp: , Rfl:    omeprazole  (PRILOSEC) 40 MG capsule, Take 1 capsule (40 mg total) by mouth daily before lunch., Disp: 90 capsule, Rfl: 0   OVER THE COUNTER MEDICATION, Vitamin D 3 1000 mg, Disp: , Rfl:    rosuvastatin  (CRESTOR ) 10 MG tablet, Take 1 tablet by mouth once daily, Disp: 90 tablet, Rfl: 0  tiZANidine  (ZANAFLEX ) 4 MG tablet, Take 1 tablet (4 mg total) by mouth at bedtime as needed for muscle spasms., Disp: 30 tablet, Rfl: 0   traZODone  (DESYREL ) 50 MG tablet, Take 1 tablet (50 mg total) by mouth at bedtime., Disp: 90 tablet, Rfl: 1   Objective:     Vitals:   07/23/24 1319  Pulse: 80  SpO2: 97%  Weight: 146 lb (66.2 kg)  Height: 5' 3 (1.6 m)      Body mass index is 25.86 kg/m.    Physical Exam:    Gen: Appears well, nad, nontoxic and pleasant Neuro:sensation intact, strength is 5/5, muscle tone wnl Skin: no suspicious lesion or defmority Psych: A&O, appropriate mood and  affect  Right shoulder:  No deformity, swelling or muscle wasting No scapular winging FF 180, abd 180 with mild pain at end range, int 0, ext 90 NTTP over the [place], clavicle, ac, coracoid, biceps groove, humerus, deltoid, trapezius, cervical spine Positive Hawkins Neg neer,  , empty can, obriens, crossarm, subscap liftoff, speeds Neg ant drawer, sulcus sign, apprehension Negative Spurling's test bilat FROM of neck    Electronically signed by:  Odis Mace D.[facility] 1:30 PM 07/23/24

## 2024-07-22 NOTE — Progress Notes (Signed)
 Remote PPM Transmission

## 2024-07-23 ENCOUNTER — Ambulatory Visit (INDEPENDENT_AMBULATORY_CARE_PROVIDER_SITE_OTHER): Admitting: Sports Medicine

## 2024-07-23 VITALS — HR 80 | Ht 63.0 in | Wt 146.0 lb

## 2024-07-23 DIAGNOSIS — G8929 Other chronic pain: Secondary | ICD-10-CM

## 2024-07-23 DIAGNOSIS — M7551 Bursitis of right shoulder: Secondary | ICD-10-CM

## 2024-07-23 DIAGNOSIS — M25511 Pain in right shoulder: Secondary | ICD-10-CM | POA: Diagnosis not present

## 2024-07-23 MED ORDER — MELOXICAM 15 MG PO TABS: 15.0000 mg | ORAL_TABLET | Freq: Every day | ORAL | 0 refills | Status: AC | PRN

## 2024-07-23 NOTE — Patient Instructions (Signed)
-   Use meloxicam  15 mg daily as needed for breakthrough pain.  Recommend limiting chronic NSAIDs to 1-2 doses per week to prevent long-term side effects. Use Tylenol  500 to 1000 mg tablets 2-3 times a day as needed for day-to-day pain relief.    Meloxicam  refill   Continue HEP   As needed follow up

## 2024-10-09 ENCOUNTER — Other Ambulatory Visit: Payer: Self-pay | Admitting: Family Medicine

## 2024-10-09 DIAGNOSIS — E039 Hypothyroidism, unspecified: Secondary | ICD-10-CM

## 2024-10-09 NOTE — Progress Notes (Shared)
 " [facility] - Clinic Note  10/23/2024   CHIEF COMPLAINT Patient presents for No chief complaint on file.  HISTORY OF PRESENT ILLNESS: Tiffany Berg is a 69 y.o. female who presents to the clinic today for:   Pt states her vision is ok thinks she needs new glasses. Its coming up on 1 year. Sees Dr. Fleeta, has appt scheduled  Referring physician: Fleeta Zerita DASEN, MD [address] [place],  [place] [address]  HISTORICAL INFORMATION:  Selected notes from the MEDICAL RECORD NUMBER Referred by Dr. Fleeta for ARMD eval LEE:  Ocular Hx- PMH-   CURRENT MEDICATIONS: No current outpatient medications on file. (Ophthalmic Drugs)   No current facility-administered medications for this visit. (Ophthalmic Drugs)   Current Outpatient Medications (Other)  Medication Sig   amoxicillin -clavulanate (AUGMENTIN ) 875-125 MG tablet Take 1 tablet by mouth every 12 (twelve) hours.   aspirin  EC 81 MG tablet Take 81 mg by mouth daily. Swallow whole.   B Complex Vitamins (VITAMIN B COMPLEX PO) Take 1 tablet by mouth daily.   Cyanocobalamin (VITAMIN B 12) 100 MCG LOZG Take by mouth.   diclofenac  Sodium (VOLTAREN ) 1 % GEL Apply 2 g topically 4 (four) times daily.   doxylamine, Sleep, (UNISOM) 25 MG tablet Take 25 mg by mouth at bedtime as needed. As need   fluticasone  (FLONASE ) 50 MCG/ACT nasal spray Place 1 spray into both nostrils 2 (two) times daily.   levothyroxine  (SYNTHROID ) 75 MCG tablet TAKE 1 TABLET BY MOUTH DAILY BEFORE BREAKFAST   losartan  (COZAAR ) 50 MG tablet Take 1 tablet (50 mg total) by mouth daily.   Magnesium  200 MG TABS Take by mouth daily at 6 (six) AM.   meloxicam  (MOBIC ) 15 MG tablet Take 1 tablet (15 mg total) by mouth daily.   meloxicam  (MOBIC ) 15 MG tablet Take 1 tablet (15 mg total) by mouth daily as needed for pain.   Omega 3 1000 MG CAPS Take 1,000 mg by mouth daily.   omeprazole  (PRILOSEC) 40 MG capsule Take 1 capsule (40 mg total) by mouth daily before  lunch.   OVER THE COUNTER MEDICATION Vitamin D 3 1000 mg   rosuvastatin  (CRESTOR ) 10 MG tablet Take 1 tablet by mouth once daily   tiZANidine  (ZANAFLEX ) 4 MG tablet Take 1 tablet (4 mg total) by mouth at bedtime as needed for muscle spasms.   traZODone  (DESYREL ) 50 MG tablet Take 1 tablet (50 mg total) by mouth at bedtime.   No current facility-administered medications for this visit. 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   w/ symptomatic bradycardia, rate 30s;  s/p  PPM 03-14-2020   Myalgia, multiple sites    Pre-diabetes  Symptomatic bradycardia 03/12/2020   s/p PPM   Wears glasses    Wears partial dentures    upper and lower   Past Surgical History:  Procedure Laterality Date   KATRINA OSTEOTOMY Right 10/15/2020   Procedure: KATRINA OSTEOTOMY;  Surgeon: Silva Juliene SAUNDERS, DPM;  Location: [facility];  Service: Podiatry;  Laterality: Right;  REGIONAL BLOCK   AIKEN OSTEOTOMY Left 02/24/2022   Procedure: KATRINA OSTEOTOMY;  Surgeon: Silva Juliene SAUNDERS, DPM;  Location: [facility] [place];  Service: Podiatry;  Laterality: Left;   ANTERIOR AND POSTERIOR REPAIR N/A 02/18/2015   Procedure: ANTERIOR (CYSTOCELE) AND POSTERIOR REPAIR (RECTOCELE);   Surgeon: Jon Rummer, MD;  Location: [facility];  Service: Gynecology;  Laterality: N/A;   BLADDER SUSPENSION N/A 02/18/2015   Procedure: TRANSVAGINAL TAPE (TVT) PROCEDURE;  Surgeon: Jon Rummer, MD;  Location: [facility];  Service: Gynecology;  Laterality: N/A;   COLONOSCOPY     pt states she might have had one many years ago   CYSTOSCOPY Bilateral 02/18/2015   Procedure: CYSTOSCOPY;  Surgeon: Jon Rummer, MD;  Location: [facility];  Service: Gynecology;  Laterality: Bilateral;   GRAFT APPLICATION Left 02/24/2022   Procedure: BONE GRAFT;  Surgeon: Silva Juliene SAUNDERS, DPM;  Location: [facility];  Service: Podiatry;  Laterality: Left;   HALLUX VALGUS LAPIDUS Right 10/15/2020   Procedure: HALLUX VALGUS LAPIDUS AND BONE GRAFT;  Surgeon: Silva Juliene SAUNDERS, DPM;  Location: [facility] [place];  Service: Podiatry;  Laterality: Right;  WITH IV SEDATION; REGIONAL BLOCK   HALLUX VALGUS LAPIDUS Left 02/24/2022   Procedure: HALLUX VALGUS LAPIDUS;  Surgeon: Silva Juliene SAUNDERS, DPM;  Location: [facility] [place];  Service: Podiatry;  Laterality: Left;   HAMMER TOE SURGERY Right 10/15/2020   Procedure: HAMMER TOE CORRECTION;  Surgeon: Silva Juliene SAUNDERS, DPM;  Location: [facility] [place];  Service: Podiatry;  Laterality: Right;  REGIONAL BLOCK   HAMMER TOE SURGERY Left 02/24/2022   Procedure: HAMMER TOE CORRECTION SECOND TOE LEFT FOOT;  Surgeon: Silva Juliene SAUNDERS, DPM;  Location: [facility] [place];  Service: Podiatry;  Laterality: Left;   LAPAROSCOPIC ASSISTED VAGINAL HYSTERECTOMY N/A 02/18/2015   Procedure: LAPAROSCOPIC ASSISTED VAGINAL HYSTERECTOMY;  Surgeon: Jon Rummer, MD;  Location: [facility];  Service: Gynecology;  Laterality: N/A;   MASTECTOMY, PARTIAL Left 08-12-2013  @[facility]    excision another site   PACEMAKER IMPLANT N/A 03/14/2020   Procedure: PACEMAKER IMPLANT;  Surgeon: Inocencio Soyla Lunger, MD;  Location: [facility];  Service: Cardiovascular;   Laterality: N/A;   RADIOACTIVE SEED GUIDED PARTIAL MASTECTOMY/AXILLARY SENTINEL NODE BIOPSY/AXILLARY NODE DISSECTION Left 07-22-2013  @[facility]    SALPINGOOPHORECTOMY Bilateral 02/18/2015   Procedure: BILATERAL SALPINGO OOPHORECTOMY;  Surgeon: Jon Rummer, MD;  Location: [facility];  Service: Gynecology;  Laterality: Bilateral;   FAMILY HISTORY Family History  Problem Relation Age of Onset   Heart disease Mother    Colon cancer Father    Cancer Father        prostata   Stomach cancer Sister    Cancer Sister 83       Breast   Esophageal cancer Neg Hx    Rectal cancer Neg Hx    SOCIAL HISTORY Social History   Tobacco Use   Smoking status: Never   Smokeless tobacco: Never  Vaping Use   Vaping status: Never Used  Substance Use Topics   Alcohol use: Yes    Comment: socially   Drug use: Never  OPHTHALMIC EXAM:  Not recorded    IMAGING AND PROCEDURES  Imaging and Procedures for 10/23/2024         ASSESSMENT/PLAN:   ICD-10-CM   1. Exudative age-related macular degeneration of right eye with active choroidal neovascularization (HCC)  H35.3211     2. Intermediate stage nonexudative age-related macular degeneration of left eye  H35.3122     3. Diabetes mellitus type 2 without retinopathy (HCC)  E11.9     4. Essential hypertension  I10     5. Hypertensive retinopathy of both eyes  H35.033     6. Combined forms of age-related cataract of both eyes  H25.813       Exudative age related macular degeneration OD  - s/p IVA OD #1 (01.14.25), #2 (02.11.25), #3 (03.11.25), #4 (04.15.25),#5 (06.03.25), #6 (08.05.25), #7 (10.30.25)  - FA (01.14.24) shows foval CNV with leakage superior fovea  - BCVA OD improved to 20/20  - OCT shows focal PED/CNV with stable improvement in shallow SRF ST to fovea at 12 wks  - recommend IVA today OD #8 (02.19.26) w/ f/u ext to 16 wks  - pt wishes to be treated with IVA  - RBA of procedure discussed, questions answered - IVA informed consent  obtained and signed, 01.14.25 (OD) - see procedure note - f/u in 16 wks, DFE, OCT, possible injection  2. Age related macular degeneration, non-exudative, OS  - The incidence, anatomy, and pathology of dry AMD, risk of progression, and the AREDS and AREDS 2 study including smoking risks discussed with patient.  - Recommend amsler grid monitoring  - monitor  3. Diabetes mellitus, type 2 without retinopathy  - A1c: 6.1 on 09.22.25,  6.4 on 09.27.24 - The incidence, risk factors for progression, natural history and treatment options for diabetic retinopathy  were discussed with patient.   - The need for close monitoring of blood glucose, blood pressure, and serum lipids, avoiding cigarette or any type of tobacco, and the need for long term follow up was also discussed with patient. - f/u in 1 year, sooner prn  4,5. Hypertensive retinopathy OU - discussed importance of tight BP control - monitor  6. Mixed Cataract OU - The symptoms of cataract, surgical options, and treatments and risks were discussed with patient. - discussed diagnosis and progression - monitor  Ophthalmic Meds Ordered this visit:  No orders of the defined types were placed in this encounter.    No follow-ups on file.  There are no Patient Instructions on file for this visit.  Explained the diagnoses, plan, and follow up with the patient and they expressed understanding.  Patient expressed understanding of the importance of proper follow up care.   This document serves as a record of services personally performed by Redell JUDITHANN Hans, MD, PhD. It was created on their behalf by Avelina Pereyra, COA an ophthalmic technician. The creation of this record is the provider's dictation and/or activities during the visit.   Electronically signed by: Avelina GORMAN Pereyra, COT  10/09/24  10:51 AM   Redell JUDITHANN Hans, M.D., Ph.D. Diseases & Surgery of the Retina and Vitreous [facility] 10/23/2024  Abbreviations: M myopia (nearsighted); A astigmatism; H hyperopia (farsighted); P presbyopia; Mrx spectacle prescription;  CTL contact lenses; OD right eye; OS left eye; OU both eyes  XT exotropia; ET esotropia; PEK punctate epithelial keratitis; PEE punctate epithelial erosions; DES dry eye syndrome; MGD meibomian gland dysfunction; ATs artificial tears; PFAT's preservative free artificial tears; NSC  nuclear sclerotic cataract; PSC posterior subcapsular cataract; ERM epi-retinal membrane; PVD posterior vitreous detachment; RD retinal detachment; DM diabetes mellitus; DR diabetic retinopathy; NPDR non-proliferative diabetic retinopathy; PDR proliferative diabetic retinopathy; CSME clinically significant macular edema; DME diabetic macular edema; dbh dot blot hemorrhages; CWS cotton wool spot; POAG primary open angle glaucoma; C/D cup-to-disc ratio; HVF humphrey visual field; GVF goldmann visual field; OCT optical coherence tomography; IOP intraocular pressure; BRVO Branch retinal vein occlusion; CRVO central retinal vein occlusion; CRAO central retinal artery occlusion; BRAO branch retinal artery occlusion; RT retinal tear; SB scleral buckle; PPV pars plana vitrectomy; VH Vitreous hemorrhage; PRP panretinal laser photocoagulation; IVK intravitreal kenalog; VMT vitreomacular traction; MH Macular hole;  NVD neovascularization of the disc; NVE neovascularization elsewhere; AREDS age related eye disease study; ARMD age related macular degeneration; POAG primary open angle glaucoma; EBMD epithelial/anterior basement membrane dystrophy; ACIOL anterior chamber intraocular lens; IOL intraocular lens; PCIOL posterior chamber intraocular lens; Phaco/IOL phacoemulsification with intraocular lens placement; PRK photorefractive keratectomy; LASIK laser assisted in situ keratomileusis; HTN hypertension; DM diabetes mellitus; COPD chronic obstructive pulmonary disease  "

## 2024-10-23 ENCOUNTER — Encounter (INDEPENDENT_AMBULATORY_CARE_PROVIDER_SITE_OTHER): Admitting: Ophthalmology

## 2024-10-23 DIAGNOSIS — H353211 Exudative age-related macular degeneration, right eye, with active choroidal neovascularization: Secondary | ICD-10-CM

## 2024-10-23 DIAGNOSIS — H353122 Nonexudative age-related macular degeneration, left eye, intermediate dry stage: Secondary | ICD-10-CM

## 2024-10-23 DIAGNOSIS — E119 Type 2 diabetes mellitus without complications: Secondary | ICD-10-CM

## 2024-10-23 DIAGNOSIS — H25813 Combined forms of age-related cataract, bilateral: Secondary | ICD-10-CM

## 2024-10-23 DIAGNOSIS — I10 Essential (primary) hypertension: Secondary | ICD-10-CM

## 2024-10-23 DIAGNOSIS — H35033 Hypertensive retinopathy, bilateral: Secondary | ICD-10-CM
